# Patient Record
Sex: Male | Born: 1940 | Race: White | Hispanic: No | Marital: Married | Smoking: Former smoker
Health system: Southern US, Community
[De-identification: ages and names within clinical notes are randomized; demographics above are authoritative.]

## PROBLEM LIST (undated history)

## (undated) DIAGNOSIS — E1159 Type 2 diabetes mellitus with other circulatory complications: Secondary | ICD-10-CM

## (undated) DIAGNOSIS — I5032 Chronic diastolic (congestive) heart failure: Secondary | ICD-10-CM

## (undated) DIAGNOSIS — I152 Hypertension secondary to endocrine disorders: Secondary | ICD-10-CM

## (undated) DIAGNOSIS — N184 Chronic kidney disease, stage 4 (severe): Secondary | ICD-10-CM

## (undated) DIAGNOSIS — G4733 Obstructive sleep apnea (adult) (pediatric): Secondary | ICD-10-CM

## (undated) DIAGNOSIS — E119 Type 2 diabetes mellitus without complications: Secondary | ICD-10-CM

## (undated) DIAGNOSIS — G629 Polyneuropathy, unspecified: Secondary | ICD-10-CM

## (undated) DIAGNOSIS — K219 Gastro-esophageal reflux disease without esophagitis: Secondary | ICD-10-CM

## (undated) DIAGNOSIS — N183 Chronic kidney disease, stage 3 unspecified: Secondary | ICD-10-CM

## (undated) DIAGNOSIS — M1990 Unspecified osteoarthritis, unspecified site: Secondary | ICD-10-CM

## (undated) DIAGNOSIS — I10 Essential (primary) hypertension: Secondary | ICD-10-CM

## (undated) DIAGNOSIS — Z794 Long term (current) use of insulin: Secondary | ICD-10-CM

## (undated) DIAGNOSIS — I5030 Unspecified diastolic (congestive) heart failure: Secondary | ICD-10-CM

## (undated) DIAGNOSIS — G4730 Sleep apnea, unspecified: Secondary | ICD-10-CM

## (undated) DIAGNOSIS — Z8489 Family history of other specified conditions: Secondary | ICD-10-CM

## (undated) DIAGNOSIS — I129 Hypertensive chronic kidney disease with stage 1 through stage 4 chronic kidney disease, or unspecified chronic kidney disease: Secondary | ICD-10-CM

## (undated) HISTORY — PX: CHOLECYSTECTOMY: SHX55

## (undated) HISTORY — PX: KNEE ARTHROSCOPY: SUR90

## (undated) HISTORY — PX: BACK SURGERY: SHX140

## (undated) HISTORY — PX: APPENDECTOMY: SHX54

---

## 1998-03-20 ENCOUNTER — Ambulatory Visit (HOSPITAL_COMMUNITY): Admission: RE | Admit: 1998-03-20 | Discharge: 1998-03-21 | Payer: Self-pay | Admitting: Orthopedic Surgery

## 1998-05-22 ENCOUNTER — Other Ambulatory Visit: Admission: RE | Admit: 1998-05-22 | Discharge: 1998-05-22 | Payer: Self-pay | Admitting: Orthopedic Surgery

## 1998-10-23 ENCOUNTER — Encounter: Payer: Self-pay | Admitting: Neurosurgery

## 1998-10-23 ENCOUNTER — Observation Stay (HOSPITAL_COMMUNITY): Admission: RE | Admit: 1998-10-23 | Discharge: 1998-10-24 | Payer: Self-pay | Admitting: Neurosurgery

## 2001-03-24 ENCOUNTER — Encounter: Admission: RE | Admit: 2001-03-24 | Discharge: 2001-03-24 | Payer: Self-pay | Admitting: Orthopedic Surgery

## 2001-03-24 ENCOUNTER — Encounter: Payer: Self-pay | Admitting: Orthopedic Surgery

## 2001-05-30 ENCOUNTER — Encounter: Admission: RE | Admit: 2001-05-30 | Discharge: 2001-05-30 | Payer: Self-pay | Admitting: Neurosurgery

## 2001-05-30 ENCOUNTER — Encounter: Payer: Self-pay | Admitting: Neurosurgery

## 2001-06-16 ENCOUNTER — Encounter: Payer: Self-pay | Admitting: Neurosurgery

## 2001-06-22 ENCOUNTER — Encounter: Payer: Self-pay | Admitting: Neurosurgery

## 2001-06-22 ENCOUNTER — Inpatient Hospital Stay (HOSPITAL_COMMUNITY): Admission: RE | Admit: 2001-06-22 | Discharge: 2001-06-27 | Payer: Self-pay | Admitting: Neurosurgery

## 2001-09-20 ENCOUNTER — Encounter: Payer: Self-pay | Admitting: Neurosurgery

## 2001-09-20 ENCOUNTER — Encounter: Admission: RE | Admit: 2001-09-20 | Discharge: 2001-09-20 | Payer: Self-pay | Admitting: Neurosurgery

## 2003-01-28 ENCOUNTER — Ambulatory Visit (HOSPITAL_COMMUNITY): Admission: RE | Admit: 2003-01-28 | Discharge: 2003-01-28 | Payer: Self-pay | Admitting: Gastroenterology

## 2003-01-28 ENCOUNTER — Encounter: Payer: Self-pay | Admitting: Gastroenterology

## 2004-07-24 ENCOUNTER — Encounter: Admission: RE | Admit: 2004-07-24 | Discharge: 2004-07-24 | Payer: Self-pay | Admitting: Internal Medicine

## 2004-09-04 ENCOUNTER — Encounter: Admission: RE | Admit: 2004-09-04 | Discharge: 2004-09-04 | Payer: Self-pay | Admitting: Internal Medicine

## 2004-09-28 ENCOUNTER — Encounter (INDEPENDENT_AMBULATORY_CARE_PROVIDER_SITE_OTHER): Payer: Self-pay | Admitting: *Deleted

## 2004-09-28 ENCOUNTER — Inpatient Hospital Stay (HOSPITAL_COMMUNITY): Admission: RE | Admit: 2004-09-28 | Discharge: 2004-09-30 | Payer: Self-pay | Admitting: General Surgery

## 2005-06-21 ENCOUNTER — Encounter: Admission: RE | Admit: 2005-06-21 | Discharge: 2005-06-21 | Payer: Self-pay | Admitting: Neurosurgery

## 2005-07-13 ENCOUNTER — Inpatient Hospital Stay (HOSPITAL_COMMUNITY): Admission: RE | Admit: 2005-07-13 | Discharge: 2005-07-15 | Payer: Self-pay | Admitting: Neurosurgery

## 2006-06-22 ENCOUNTER — Encounter: Admission: RE | Admit: 2006-06-22 | Discharge: 2006-06-22 | Payer: Self-pay | Admitting: Orthopedic Surgery

## 2007-02-27 ENCOUNTER — Other Ambulatory Visit: Payer: Self-pay | Admitting: Orthopedic Surgery

## 2007-07-07 ENCOUNTER — Encounter: Payer: Self-pay | Admitting: Gastroenterology

## 2007-07-07 ENCOUNTER — Encounter: Admission: RE | Admit: 2007-07-07 | Discharge: 2007-07-07 | Payer: Self-pay | Admitting: Internal Medicine

## 2007-07-08 ENCOUNTER — Inpatient Hospital Stay (HOSPITAL_COMMUNITY): Admission: EM | Admit: 2007-07-08 | Discharge: 2007-07-10 | Payer: Self-pay | Admitting: Emergency Medicine

## 2007-11-20 ENCOUNTER — Encounter: Admission: RE | Admit: 2007-11-20 | Discharge: 2007-11-20 | Payer: Self-pay | Admitting: Orthopedic Surgery

## 2007-12-24 ENCOUNTER — Emergency Department (HOSPITAL_COMMUNITY): Admission: EM | Admit: 2007-12-24 | Discharge: 2007-12-24 | Payer: Self-pay | Admitting: Emergency Medicine

## 2007-12-25 ENCOUNTER — Emergency Department (HOSPITAL_COMMUNITY): Admission: EM | Admit: 2007-12-25 | Discharge: 2007-12-25 | Payer: Self-pay | Admitting: Emergency Medicine

## 2009-03-15 ENCOUNTER — Emergency Department (HOSPITAL_COMMUNITY): Admission: EM | Admit: 2009-03-15 | Discharge: 2009-03-15 | Payer: Self-pay | Admitting: Emergency Medicine

## 2009-05-05 ENCOUNTER — Encounter: Payer: Self-pay | Admitting: Gastroenterology

## 2009-06-11 ENCOUNTER — Encounter: Payer: Self-pay | Admitting: Gastroenterology

## 2009-06-16 ENCOUNTER — Encounter: Payer: Self-pay | Admitting: Gastroenterology

## 2009-06-16 ENCOUNTER — Telehealth (INDEPENDENT_AMBULATORY_CARE_PROVIDER_SITE_OTHER): Payer: Self-pay | Admitting: *Deleted

## 2009-06-16 DIAGNOSIS — K861 Other chronic pancreatitis: Secondary | ICD-10-CM

## 2009-07-10 ENCOUNTER — Ambulatory Visit (HOSPITAL_COMMUNITY): Admission: RE | Admit: 2009-07-10 | Discharge: 2009-07-10 | Payer: Self-pay | Admitting: Gastroenterology

## 2009-07-10 ENCOUNTER — Ambulatory Visit: Payer: Self-pay | Admitting: Gastroenterology

## 2010-08-23 ENCOUNTER — Encounter: Payer: Self-pay | Admitting: Internal Medicine

## 2010-08-23 ENCOUNTER — Encounter: Payer: Self-pay | Admitting: Gastroenterology

## 2010-11-03 LAB — IGG 1, 2, 3, AND 4
IgG Subclass 1: 713 mg/dL (ref 382–929)
IgG Subclass 2: 274 mg/dL (ref 241–700)
IgG Subclass 3: 62 mg/dL (ref 22–178)
IgG Subclass 4: 51.9 mg/dL (ref 4.0–86.0)
IgG Total IGGSUB: 1170 mg/dL (ref 694–1618)

## 2010-11-07 LAB — URINALYSIS, ROUTINE W REFLEX MICROSCOPIC
Bilirubin Urine: NEGATIVE
Glucose, UA: NEGATIVE mg/dL
Ketones, ur: NEGATIVE mg/dL
Leukocytes, UA: NEGATIVE
Nitrite: NEGATIVE
Protein, ur: 30 mg/dL — AB
Specific Gravity, Urine: 1.008 (ref 1.005–1.030)
Urobilinogen, UA: 0.2 mg/dL (ref 0.0–1.0)
pH: 5.5 (ref 5.0–8.0)

## 2010-11-07 LAB — COMPREHENSIVE METABOLIC PANEL
ALT: 30 U/L (ref 0–53)
AST: 28 U/L (ref 0–37)
Albumin: 3.2 g/dL — ABNORMAL LOW (ref 3.5–5.2)
Alkaline Phosphatase: 81 U/L (ref 39–117)
Glucose, Bld: 114 mg/dL — ABNORMAL HIGH (ref 70–99)
Potassium: 4.2 mEq/L (ref 3.5–5.1)
Sodium: 133 mEq/L — ABNORMAL LOW (ref 135–145)
Total Protein: 6.8 g/dL (ref 6.0–8.3)

## 2010-11-07 LAB — URINE MICROSCOPIC-ADD ON

## 2010-11-07 LAB — CBC
Hemoglobin: 14.1 g/dL (ref 13.0–17.0)
Platelets: 245 10*3/uL (ref 150–400)
RDW: 13.3 % (ref 11.5–15.5)

## 2010-11-07 LAB — DIFFERENTIAL
Basophils Relative: 0 % (ref 0–1)
Eosinophils Absolute: 0.1 10*3/uL (ref 0.0–0.7)
Eosinophils Relative: 1 % (ref 0–5)
Monocytes Absolute: 1 10*3/uL (ref 0.1–1.0)
Monocytes Relative: 11 % (ref 3–12)

## 2010-12-15 NOTE — H&P (Signed)
NAMEJOMES, [name] NO.:  [id number]   MEDICAL RECORD NO.:  [id number]          PATIENT TYPE:  INP   LOCATION:  [id number]                         FACILITY:  [facility]   PHYSICIAN:  Irine Seal, MD    DATE OF BIRTH:  [birth year]   DATE OF ADMISSION:  07/08/2007  DATE OF DISCHARGE:                              HISTORY & PHYSICAL   PRIMARY CARE PHYSICIAN:  Delanna Ahmadi, M.D.   HISTORY OF PRESENT ILLNESS:  Robert Dickerson is a 70 year old white male  with history of hypertension, status post cholecystectomy February 2006.  Multiple lumbar surgeries who presented to the ED with a three-day  history of nonradiating epigastric pain, chills, subjective fevers,  nausea and emesis on the day of admission.  The patient was initially  seen by his PCP one day prior to admission for these similar complaints.  A CT scan of abdomen and pelvis were done which are consistent with  acute pancreatitis without any biliary or pancreatic duct dilatation.  The patient was given some p.o. pain medications and sent home and told  to stay well hydrated.  On the morning of admission, the patient had  developed emesis after drinking clear liquids.  The patient called his  PCP, who sent the patient to the emergency room to be admitted.  The  patient denies any chest pain, no shortness of breath, no hematemesis,  no melena.  No dysuria, no cough, no headaches, no visual changes.  No  other associated symptoms.  The patient does also states that he does  note having some mild diarrhea for the past two days prior to admission  as well as clammy feeling.  No other complaints.   ALLERGIES:  NO KNOWN DRUG ALLERGIES.   PAST MEDICAL HISTORY:  1. Hypertension.  2. Gastroesophageal reflux disease.  3. 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 Luiz Ochoa in      November 2002.  4. Also the patient has a history of stenosis.  5. Spondylosis.  6. 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   PAST SURGICAL HISTORY:  1. Status post lumbar laminectomy x2 to the right L4-L5.  2. Diskectomies in 1999 and 2000.  3. Status post right knee arthroscopies x3.  4. Status post appendectomy in 1993.  5. Status post cholecystectomy February 2006.  6. Status post posterior lumbar interbody fusion of L4-L5 with      interbody cages and nonsegmental pedicle screws and posterolateral      fusion at L4-L5 with autograft per Dr. Luiz Ochoa in November 2002.  7. Status post redo [facility] decompressive laminectomies at L2-L3, L3-L4,      posterior lumbar interbody fusion L2-L3 and L3-L4, Saber antibiotic      changes at L2-L3, L3-L4, segmental pedicle screw fixation  with      Monarch screws L2-L4, posterior lateral fusion L2-L4 autograft      Infuse bone morphogenic protein done in July 13, 2005, per Dr.      Luiz Ochoa.   HOME MEDICATIONS:  1. Benicar/hydrochlorothiazide 40/12.5 mg p.o. daily.  2. Ranitidine 150 mg p.o. daily.  3. Hydrocodone 5/500 one tablet p.o. q.4h. p.r.n. started yesterday.  4. Naprosyn p.r.n.  That is on hold.   SOCIAL HISTORY:  The patient is disabled, married, lives in [place]  with his wife, former history of tobacco abuse but quit in 1984,  currently tobacco-free, no alcohol use, no IV drug use.   FAMILY HISTORY:  Mother is alive at age 42 with hypertension, dementia,  osteoporosis.  Father is alive at age 82 status post nephrectomy and  renal carcinoma.  Brother with hypertension and has a son who is status  post cholecystectomy.  No family history of pancreatic disease.   REVIEW OF SYSTEMS:  As per HPI, otherwise negative.   PHYSICAL EXAMINATION:  VITAL SIGNS: Temperature 98.2, blood pressure  124/74,  pulse of 77, respiratory rate 18, sating 99% on room air.  GENERAL:  The patient is lying on a gurney.  HEENT:  Normocephalic, atraumatic.  Pupils equal, round and react to  light.  Extraocular movements intact.  Oropharynx was clear, dry.  No  lesions.  No exudates.  NECK:  Supple.  No lymphadenopathy.  RESPIRATORY:  Lungs are clear to auscultation bilaterally.  CARDIOVASCULAR:  Regular rate and rhythm.  No murmurs, rubs or gallops.  ABDOMEN:  Soft, nontender, mild distention.  Positive bowel sounds.  No  rebound or guarding.  EXTREMITIES:  No clubbing, cyanosis or edema.  NEUROLOGIC:  The patient is alert and oriented x3.  Cranial nerves II-  XII are grossly intact.  No focal deficits.  Sensation is intact.  Cerebellum is intact.  Gait was not tested.   LABORATORY DATA:  Lipase of 33.  CMET with sodium 130, potassium 3.7,  chloride 96, bicarb 24, BUN 42, creatinine 3.10, glucose of 157.  Bilirubin 1.3, alkaline phosphatase 96, AST 20, ALT 25, protein 6.8,  albumin 2.7.  Baseline creatinine was 1.32.  CBC with white count 12.2,  hemoglobin of 14.1, platelets 326, hematocrit 40.1, ANC of 10.9.   CT of abdomen and pelvis done on July 07, 2007, showed fullness in  the pancreatic head and uncinate process with peripancreatic  edema/inflammation.  Imaging feature suspicious for pancreatitis or the  pancreatic head did appear somewhat full on comparison study as well.  There is more peripancreatic edema/inflammation on today's exam/ these  may represent recurrent focal pancreatitis.  Given the lack of biliary  or pancreatic ductal dilatation and mass lesion is felt to be less  likely.  A follow-up MRI could be performed after allowing time for  resolution of acute findings to further assess the pancreatic parenchyma  up warranted.  No acute findings in the anatomic pelvis.   ASSESSMENT AND PLAN:  Robert Dickerson is a 70 year old gentleman with  history of hypertension, status post a  cholecystectomy and multiple  lumbar surgeries who is presenting with acute pancreatitis.  1. Acute pancreatitis of questionable etiology.  Differential includes      gallstones which is unlikely as the patient is status post      cholecystectomy in 2006.  CT scan did not show any biliary      dilatation or pancreatic dilatation versus alcohol abuse.  The  patient has no history of alcohol use.  Denies any alcohol abuse      versus hypertriglyceridemia.  The patient states that he had a      fasting lipid panel which was obtained in the primary care      physician's office recently versus drug-induced as the patient is      on Benicar/hydrochlorothiazide versus an infectious etiology versus      idiopathic.  Will place the patient on bowel rest, hydrate with IV      fluids and pain management and follow.  2. Acute renal failure.  The patient's baseline creatinine is 1.32 as      of July 2008, is prerenal versus renal versus post renal.  Will      check a fractional secretion of sodium.  Check check a FeNa, will      hydrate the patient with IV fluids, follow the patient's      creatinine.  If no improvement in the patient's creatinine,      consider getting a renal ultrasound to rule out obstruction  3. Hypertension.  Hold blood pressure meds for now secondary to      problem #1.  4. Gastroesophageal reflux disease.  Protonix 40 mg daily.  5. Leukocytosis.  We are going to go ahead and panculture the patient,      will get blood cultures x2.  Chest x-ray, urinalysis with cultures      and sensitivity.  We will start the patient on empiric antibiotics      with ciprofloxacin and await culture results and tailor antibiotics      accordingly.  6. Hyponatremia.  Likely hypovolemic hyponatremia.  We will hydrate      the patient on IV fluids and follow the patient's sodium.  If no      with improvement or worsening, we may consider getting a urine      osmolality.  7. Status post  multiple lumbar surgeries.  Pain control and follow.  8. Diarrhea.  Question of a viral gastroenteritis.  Will check stool      cultures for fecal leukocytes, ova and parasites, enteric bowel      pathogens and Clostridium difficile.  We will follow.  9. Prophylaxis.  Continue the patient on Protonix 40 mg IV daily for      gastrointestinal prophylaxis and Lovenox for deep venous thrombosis      prophylaxis.   It has been a pleasure taking care of Robert Dickerson.      Irine Seal, MD  Electronically Signed     DT/MEDQ  D:  07/08/2007  T:  07/09/2007  Job:  [id number]

## 2010-12-18 NOTE — Op Note (Signed)
Robert Dickerson, OLDT NO.:  [id number]   MEDICAL RECORD NO.:  [id number]          PATIENT TYPE:  INP   LOCATION:  2899                         FACILITY:  [facility]   PHYSICIAN:  Otilio Connors, M.D.  DATE OF BIRTH:  Oct 26, 1940   DATE OF PROCEDURE:  07/13/2005  DATE OF DISCHARGE:                                 OPERATIVE REPORT   PREOPERATIVE DIAGNOSIS:  Stenosis, spondylosis, spondylolisthesis at L2-L3  and L3-L4 with instability and prior surgery with fusion L4-L5.   POSTOPERATIVE DIAGNOSIS:  Stenosis, spondylosis, spondylolisthesis at L2-L3  and L3-L4 with instability and prior surgery with fusion L4-L5.   PROCEDURE:  Redo [facility] decompressive laminectomies at L2-L3 and L3-L4,  posterior lumbar interbody fusion L2-L3 and L3-L4, Saber interbody cages at  L2-L3 and L3-L4, segmented pedicle screw fixation with Monarch screws L2  through L4, posterolateral fusion L2 through L4 (two levels), autograft same  incision, Infuse bone morphogenic protein.   SURGEON:  Otilio Connors, M.D.   ASSISTANT:  None.   ANESTHESIA:  General endotracheal anesthesia.   ESTIMATED BLOOD LOSS:  500 mL.   BLOOD REPLACED:  400 mL Cell Saver.   COMPLICATIONS:  None.   DRAINS:  None.   INDICATIONS FOR PROCEDURE:  The patient is a 70 year old gentleman who has  had back and right leg pain and hip pain.  He had a fusion four years ago at  L4-L5 and had done well until the last six months or so when he started  having progressive symptoms.  MRI and x-rays were done showing severe  spondylitic changes at the 2-3 and 3-4 levels with some slight movement  between flexion and extension, slight scoliosis, slight spondylolisthesis,  and stenosis at these levels, foraminal narrowing with a good fusion at 4-5.  The patient is brought in for redo laminectomy and extension of the fusion  up to 2-3 and 3-4.   PROCEDURE IN DETAIL:  The patient was brought to the operating room, general  anesthesia was induced, the patient was placed in the prone position on the  Wilson frame with all pressure points padded.  The patient was prepped and  draped in the usual sterile fashion.  The site of the incision was injected  with 20 mL of 1% lidocaine with epinephrine.  An incision was made at the  site of previous midline incision in the lumbar spine and extended cephalad.  The incision was taken down to the fascia.  Hemostasis was obtained with  Bovie cauterization.  The fascia was incised over the L2, L3, and L4 spinous  processes.  Subperiosteal dissection was done dissecting out to the lamina  over the facets and to the transverse processes of L2, L3, and L4.  At L4,  there was a pedicle screw, this was found and we exposed the pedicle screws  at 4 and 5 bilaterally.  The top locking nuts were then removed, the rods  were removed.  We pushed on the L4 fusion, x-rays showed it was well fused,  it appeared to be well fused here interoperatively.  The L5 screws  were  removed.  Carefully dissecting L4 lamina because half of it was removed from  the prior surgery and there was significant scar.  Laminectomies were then  done removing the bottom part of L2, all of L3, and the rest of L4,  dissecting into the scar tissue to decompress the central canal.  We tracked  this out laterally doing facetectomies decompressing the whole canal and the  nerve roots decompressed out their foramen.  A large disc bulge was seen at  2-3 and was even worse at 3-4 and there was significant stenosis where the  prior surgery was just below 3-4 where the hypertrophic ligament had melted  into the scar tissue and we carefully dissected this out to decompress the  central canal and decompress the 4 roots.  Once we had good decompression of  all the nerve roots, we explored the epidural space and obtained hemostasis  with bipolar cauterization, incised the disc space at 3-4 bilaterally and  then discectomy was  performed.  We distracted the disc space up to 9 mm  using interbody distractors and prepared the interbody spaces for the cages  by using various broaches.  We continued to remove disc and remove the  cartilaginous endplate.  These were rasped to clean off the endplates and  then we packed two Saber cages with Infuse BMP that was put in the anterior  aspect of the cage using autograft bone that was removed during the  laminectomy, cleaned of all soft tissues, chopped into small pieces.  Posteriorly, we put the autograft bone into the disc space and then tapped a  cage into place alongside while holding distraction.  We removed the  distractor and placed a second cage on the opposite side after placing bone  in the interspace.  We repeated this process at the 2-3 level using the 9 by  9 cage there.  Lateral fluoroscopy showed good position of the cages with  good alignment of the spine.  Hemostasis was obtained with Gelfoam and  thrombin and attention was turned to the transverse processes and facets  which were decorticated with a high speed drill.  Using fluoroscopy as a  guide and interoperative landmarks, decorticated at the L2 pedicle entry  point on the right side, placed a pedicle probe down the pedicle, and probed  with a small ball probe making sure the bony edges were tapped and placed a  Monarch 55 mm screw into the L2 pedicle.  This process was repeated at L3  using a 50 mm screw, the L4 screw was already in place from prior surgery.  We used the same process on the left side using the same size screws.  Again, we used fluoroscopy during the process of putting the screws in.  We  then placed rods into the screw heads, placed locking caps, and then  tightened the screws, putting compression over the screws, locked the next  one, compressed over the two remaining screws, and locked the last one and  repeated this on the opposite side.  When we were finished, final AP and lateral  fluoroscopic imaging was obtained and this showed good position of  pedicle screws, rods, interbody bone plugs, and good alignment of the spine.  The wound was irrigated with antibiotic solution, we had good hemostasis,  Gelfoam and thrombin was irrigated  out of the epidural space and a some new  was lightly placed over the lateral gutters so no bone graft would fall into  that area.  Infuse BMP was cut in strips and placed in the posterolateral  gutter for posterolateral fusion from L2 to L4 and then the rest of the  autograft bone was placed in the posterolateral gutters for a fusion from L2  to L4 bilaterally at the posterolateral space.  The retractors were removed,  we had good hemostasis.  The paraspinous muscles were closed with 0 Vicryl  interrupted sutures, the fascia was closed with 0 Vicryl interrupted  sutures, the subcutaneous tissues were closed with 0, 2-0 and 3-0 Vicryl  interrupted sutures, and the skin was closed with Benzoin and Steri-Strips.  A dressing was placed.  The patient was placed back in a supine position,  awakened from anesthesia, and transferred to the recovery room in stable  condition.           ______________________________  Otilio Connors, M.D.     JRH/MEDQ  D:  07/13/2005  T:  07/14/2005  Job:  [id number]

## 2010-12-18 NOTE — H&P (Signed)
[place]  Patient:    Robert Dickerson, Robert Dickerson Visit Number: [id number] MRN: [id number]          Service Type: SUR Location: [place] 03 Attending Physician:  Hazle Coca Dictated by:   Otilio Connors, M.D. Admit Date:  06/22/2001                           History and Physical  CHIEF COMPLAINT:  Back and right leg pain.  HISTORY OF PRESENT ILLNESS:  The patient is a 70 year old gentleman who has undergone two right L4-5 diskectomies in 1999 and then again in 2000.  After the second one, he still had a decreased amount of pain down his leg, but still had pain.  Further work-up showed no sign of recurrent disk herniation in May of 2000, but he continued to worsen over the last couple of years. Over the last couple of months, he has had an increase in the amount of pain with pain radiating down his right leg.  No left leg symptoms.  He also complains of back pain, which has not improved despite anti-inflammatories, physical therapy, and other interventions.  An MRI was done showing recurrent disk herniation and some scar at the right side at L4-5 causing canal stenosis and nerve root decompression.  There is also a broad based bulge in the lateral ______ causing approximately L4 compression.  There degenerative disk disease and increased disk height that we can see.  There is no abnormal motion between flexion and extension.  The patient is brought in for decompression and fusion for recurrent disk herniation and degenerative disk disease.  PAST MEDICAL HISTORY:  Significant for hypertension.  He is currently disabled because of knee and back problems.  MEDICATIONS:  Hydrochlorothiazide, Zantac, and Naprosyn.  ALLERGIES:  No known drug allergies.  SOCIAL HISTORY:  Again, he is disabled.  He is married.  He does not smoke or drink alcohol.  FAMILY HISTORY:  Noncontributory.  PAST SURGICAL HISTORY:  Surgeries include the two lumbar laminectomies  as mentioned above, three arthroscopic knee procedures, and appendectomy in 1993.  REVIEW OF SYSTEMS:  Otherwise negative.  PHYSICAL EXAMINATION:  He is awake, alert, and in no apparent distress.  WEIGHT:  229 pounds.  HEIGHT:  5 feet 10 inches.  VITAL SIGNS:  Blood pressure 158/98, pulse 90, temperature 97.2 degrees.  HEENT:  Unremarkable.  NECK:  Supple.  LUNGS:  Clear.  HEART:  Regular rate and rhythm.  ABDOMEN:  Soft and nontender.  EXTREMITIES:  Intact.  No edema.  NEUROLOGIC:  Positive straight leg raising on the right with pain going into the calf, but not into the foot.  Negative left.  Motor strength shows 4+/5 strength in the right dorsiflexion and extensor hallucis longus. Sensation is decreased in the right L4 distribution to light touch and pinprick.  ASSESSMENT AND PLAN:  With recurrent disk herniation and degenerative disk disease, we have recommended decompression and fusion and the patient was admitted for such. Dictated by:   Otilio Connors, M.D. Attending Physician:  Hazle Coca DD:  06/22/01 TD:  06/22/01 Job: [id number]

## 2010-12-18 NOTE — Discharge Summary (Signed)
[place]  Patient:    NUMAIR, SCHALOW Visit Number: [id number] MRN: [id number]          Service Type: SUR Location: [place] 01 Attending Physician:  Hazle Coca Dictated by:   Otilio Connors, M.D. Admit Date:  06/22/2001 Discharge Date: 06/27/2001                             Discharge Summary  DIAGNOSES:  Recurrent herniated nucleus pulposus and degenerative disk disease at L4-5.  PROCEDURE:  Posterior lumbar interbody fusion L4-5 with Brantigan interbody cages, segmental pedicle screw, posterolateral fusion L4-5 with autograft sterile incision.  REASON FOR ADMISSION:  The patient is a 70 year old gentleman who has had two right 4-5 diskectomies in the past, who has had continued and recurrent pain, and work-up shows recurrent disk herniation with collapse of the disk space and degenerative disk disease and foraminal narrowing.  The patient brought in for decompression and fusion.  HOSPITAL COURSE:  The patient was admitted to Day Surgery and underwent the procedure above.  He did have a dural rent intraoperatively which was repaired primarily.  He was then kept at lots of bed rest for 72 hours.  During that time, he developed a postop ileus.  He was moving his legs well and did not have any leg pain and no headaches or drainage from the incision.  We then started getting him up and during the 24th, we increased his activity to where he was up ambulating without headache, no wound drainage.  He still had no bowel movement at that time but did by 06/26/01 and over the last 24 hours started eating and having bowel movements.  Abdominal distension has decreased.  His back pain is minimal, taking minimal pain medication and no leg pain.  He has been up ambulating and doing well.  Incision is clean, dry, and intact.  He will be discharged home in stable condition.  DISCHARGE MEDICATIONS:  Same as prehospitalization plus Flexeril p.r.n.  spasms and Percocet 1-2 p.o. q.4-6h. p.r.n. pain.  No NSAIDs for the next 3-4 weeks.  DIET:  As tolerated.  INSTRUCTIONS:  Keep the incision dry for three more days, up with brace only. No strenuous and follow up with me in three weeks in my office. Dictated by:   Otilio Connors, M.D. Attending Physician:  Hazle Coca DD:  06/27/01 TD:  06/27/01 Job: [id number]

## 2010-12-18 NOTE — Discharge Summary (Signed)
NAMELEVON, HECHT NO.:  [id number]   MEDICAL RECORD NO.:  [id number]          PATIENT TYPE:  INP   LOCATION:  [id number]                         FACILITY:  [facility]   PHYSICIAN:  Delanna Ahmadi, M.D.  DATE OF BIRTH:  Sep 23, 1940   DATE OF ADMISSION:  07/08/2007  DATE OF DISCHARGE:  07/10/2007                               DISCHARGE SUMMARY   REASON FOR ADMISSION:  Mr. Virola is a 70 year old white male who had  presented to the office on December 5 complaining of abdominal pain and  chills.  The patient had had a CT scan of the abdomen that day  consistent with acute pancreatitis.  He had been found to have a mildly  elevated creatinine.  He was sent home from the office on clear liquids.  On the next day, he had developed vomiting on several occasions and  could not keep down any fluid.  The patient was sent to the ER for  admission.   SIGNIFICANT FINDINGS:  VITAL SIGNS:  Temperature 98.2, blood pressure  124/74, heart 77, respirations 18.  LUNGS:  Lungs were clear.  HEART: Regular rate and rhythm without murmur, gallop or rub.  ABDOMEN:  Soft, nontender, mild distention.  Normal bowel sounds   LABORATORY DATA:  Lipase 33, sodium 130, potassium 3.7, chloride 96,  bicarbonate 24, BUN 42, creatinine 3.1, glucose 157, total bilirubin  1.3, alk phos 96, AST 20, ALT 25, BUN 2.7.  CBC:  WBC 12.2, hemoglobin  14.1, platelet count 326.   HOSPITAL COURSE:  Acute renal failure.  The patient was admitted for  acute renal failure.  His creatinine had risen from 1.7 to 3.1 in 24  hours.  His renal failure was likely related to volume depletion  secondary to pancreatitis in the face of taking ARB and nonsteroidal  anti-inflammatory agent.  The patient was treated with IV fluids.  He  had a good urine output.  His creatinine dropped to 2.7 by discharge.  He had no nausea, vomiting.  No abdominal pain.  His ARB and Naprosyn  were held.  He was discharged in good condition.   DISCHARGE DIAGNOSES:  1. Acute renal failure.  2. Acute pancreatitis.  3. Hypertension.  4. Gastroesophageal reflux disease.  5. 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   PROCEDURES:  None.   DISCHARGE MEDICATIONS:  1. Ranitidine 150 mg one a day.  2. __________  and Naprosyn were held.   DISCHARGE INSTRUCTIONS:  He was asked to drink lots of fluids.   DISCHARGE FOLLOWUP:  Follow up in 2 days with Dr. Laurann Montana for repeat B-  Met.   DISCHARGE DIET:  Low fat diet.   DISCHARGE ACTIVITIES:  As tolerated.   DISPOSITION:  Discharged to home.           ______________________________  Delanna Ahmadi, M.D.     JJG/MEDQ  D:  07/11/2007  T:  07/11/2007  Job:  [id number]

## 2010-12-18 NOTE — Op Note (Signed)
   NAME:  [name], [name]                           ACCOUNT NO.:  [id number]   MEDICAL RECORD NO.:  [id number]                   PATIENT TYPE:  AMB   LOCATION:  ENDO                                 FACILITY:  [facility]   PHYSICIAN:  Earle Gell, M.D.                DATE OF BIRTH:  1940-10-21   DATE OF PROCEDURE:  01/28/2003  DATE OF DISCHARGE:                                 OPERATIVE REPORT   PROCEDURE INDICATION:  [name] is a 70 year old male born on 1941-05-04.  [name] is scheduled to undergo his second screening colonoscopy  with polypectomy to prevent colon cancer.  [name] tells me he last  underwent a colonoscopy in [place], [place], approximately seven  years ago and colon polyps may have been removed during the procedure.   ENDOSCOPIST:  Earle Gell, M.D.   PREMEDICATION:  Versed 10 mg and Demerol 50 mg.   DESCRIPTION OF PROCEDURE:  After obtaining informed consent, Mr. [name] was  placed in the left lateral decubitus position.  I administered intravenous  Demerol and intravenous Versed to achieve conscious sedation for the  procedure.  The patient's blood pressure, oxygen saturation, and cardiac  rhythm were monitored throughout the procedure and documented in the medical  record.   Anal inspection was normal.  Digital rectal exam was normal.  The prostate  was non-nodular.  The Olympus adult colonoscope was introduced into the  rectum and advanced to the cecum.  Colonic preparation for the exam today  was excellent.   Rectum normal.   Sigmoid colon and descending colon normal.   Splenic flexure normal.   Transverse colon normal.   Hepatic flexure normal.   Ascending colon normal.   Cecum and ileocecal valve normal.    ASSESSMENT:  Normal screening proctocolonoscopy to the cecum.  No endoscopic  evidence for the presence of colorectal neoplasia.                                               Earle Gell, M.D.    MJ/MEDQ  D:   01/28/2003  T:  01/28/2003  Job:  [id number]   cc:   Delanna Ahmadi, M.D.  [address] [place] [address]  Fax: [phone]

## 2010-12-18 NOTE — Op Note (Signed)
NAMEFUQUAN, OSWALT NO.:  [id number]   MEDICAL RECORD NO.:  [id number]          PATIENT TYPE:  INP   LOCATION:  2550                         FACILITY:  [facility]   PHYSICIAN:  Shellia Carwin, M.D. DATE OF BIRTH:  [birth year]   DATE OF PROCEDURE:  09/28/2004  DATE OF DISCHARGE:                                 OPERATIVE REPORT   OPERATIVE PROCEDURE:  Attempted laparoscopic cholecystectomy, open  cholecystectomy.   SURGEON:  Shellia Carwin, M.D.   ASSISTANT:  Rudell Cobb. Annamaria Boots, M.D.   ANESTHESIA:  General.   PREOPERATIVE DIAGNOSIS:  Gallstones.   POSTOP DIAGNOSIS:  Gallstones, multiple adhesions.   CLINICAL SUMMARY:  A 70 year old male with symptomatic gallstones, multiple  and small with normal liver function studies brought in for elective  cholecystectomy. He has had a long paramedian incision for appendicitis many  years ago.   OPERATIVE FINDINGS:  Attempted laparoscopic was unsuccessful because of  multiple adhesions. The open procedure went well, gallbladder was thin-  walled and had multiple stones. The cystic duct was very small in size and  therefore no cholangiogram performed.   OPERATIVE PROCEDURE:  Under satisfactory general endotracheal anesthesia,  having received 1.0 grams Ancef preop, the patient's abdomen was prepped and  draped in standard fashion. A transverse incision made above the umbilicus  and the midline opened into the peritoneum. Camera placed and I could not  achieve good visualization. The port was then removed and finger dissection  used to sweep away some of the omentum and again attempts made with the  camera placement after CO2 and I could not achieve safe exposure and felt  that it was better to open the patient.   Accordingly the midline incision was closed with the previous figure-of-  eight 0 Vicryl and a second interrupted 0 Vicryl. Then a right subcostal  incision was made into the abdomen. Adhesions were taken down  by sharp and  blunt dissection and the gallbladder visualized. Packing was placed,  exposure set was self-retaining retractors and the gallbladder removed. It  was grasped high and then dissected downward. Bleeders were encountered and  treated with clips or cautery. The cystic artery was identified, controlled  with multiple clips and divided. The cystic duct was then the only structure  holding the gallbladder, it was quite small and cholangiogram not performed  but the duct was controlled with multiple clips and divided and the  gallbladder removed. Hemostasis checked and areas were either coagulated or  clipped. A piece of Surgicel was placed for further hemostasis. After the  abdomen lavaged with saline.  Then with correct sponge and needle and instrument counts, the abdomen  closed in layers with running #1 PDS and staples applied to both skin  incisions. A total of 30 cc of Marcaine was infiltrated during the procedure  for postop analgesia. He went to the recovery room in good condition.      MRL/MEDQ  D:  09/28/2004  T:  09/28/2004  Job:  [id number]   cc:   Delanna Ahmadi, M.D.  [address]  [place]  [place] [address]  Fax: [phone]

## 2010-12-18 NOTE — Op Note (Signed)
[facility]  Patient:    Robert Dickerson, Robert Dickerson Visit Number: [id number] MRN: [id number]          Service Type: SUR Location: [place] [facility] Attending Physician:  Hazle Coca Dictated by:   Otilio Connors, M.D. Proc. Date: 06/22/01 Admit Date:  06/22/2001                             Operative Report  PREOPERATIVE DIAGNOSIS:  Recurrent x 2 herniated nucleus pulposus at right L4-5 and degenerative disk disease.  POSTOPERATIVE DIAGNOSIS:  Recurrent x 2 herniated nucleus pulposus at right L4-5 and degenerative disk disease.  PROCEDURE:  Posterior lumbar interbody fusion, L4-5, with Brantigan interbody cages and nonsegmental pedicle screws and posterolateral fusion, L4-5, with autograft, same incision.  SURGEON:  Otilio Connors, M.D.  ASSISTANT:  Gwenyth Ober. Rolin Barry, M.D.  ANESTHESIA:  General endotracheal tube anesthesia.  ESTIMATED BLOOD LOSS:  800 cc.  BLOOD GIVEN:  None.  DRAINS:  None.  COMPLICATIONS:  Dural rent, repaired primarily.  INDICATION FOR PROCEDURE:  The patient is a 70 year old gentleman who has had two right 4-5 diskectomies and now has further recurrence of disk herniation. He has had collapse of that disk space with foraminal narrowing.  Patient brought in for decompression and fusion.  DESCRIPTION OF PROCEDURE:  The patient was brought in the operating room, and general anesthesia was induced.  The patient was placed in the prone position on the Wilson frame with all pressure points padded.  The patient was prepped and draped in a sterile fashion, and the site of the incision, which was the previous scar, was injected with 10 cc 1% lidocaine with epinephrine. Incision was made in the midline of the lower lumbar spine at the site of previous surgery.  The incision was lengthened at both ends a little bit.  The incision taken down to the fascia and hemostasis obtained with Bovie cauterization.  The fascia was incised with the  Bovie, and subperiosteal dissection was done over the L3, L4, and L5 spinous processes and laminae out to the facets.  Fluoroscopic imaging was used to confirm levels.  After confirming the levels, did the dissection on left side, did the dissection on the right side.  Scar was seen at the 4-5 interspace, and we stayed away from that at this point getting out to the facets.  The laminectomy was then performed with Leksell rongeurs, Kerrison punches, removing L4 and doing bilateral medial facetectomies with more taken on the right side.  As we were trying to dissect in the lamina-dura junction toward the scar tissue, a dural rent was obtained.  We continued the laminectomy, removing the bone and scar from the dura around the dural rent, and then this was closed primarily.  We continued our dissection laterally to the _____ of the right side.  There was a large amount of scar tissue compressing the 5 root, tethering the 5 root to the pedicle.  This was all removed and loosened very carefully.  There did seem to be some disk material within this, also some calcified fragments adherent to the dura.  As we removed all this, we got better and better decompression.  The disk space was then entered and diskectomy performed with pituitary rongeurs and curettes.  We then performed diskectomy on the left side.  Lateral diskectomies were then done bilaterally, and a lot of extruded fragment was removed from the right side,  which was probably compressing the 4 root.  We then started distracting with interspace distractors at the 4-5 interspace, distracting up to 11 mm.  We prepared the interbody space for Brantigan cages using the broach and then after cleaning all the bone that was removed during the laminectomy, chopping it into small pieces and adding Grafton putty to this.  This mixture was then packed into two 11 high x 9 mm wide Brantigan cages.  With distraction on one side, the cage was tapped  into the opposite side.  The distractor was then removed, bone was packed into the interspace, and then the second cage was placed.  Fluoroscopic imaging was used during the procedure.  We then explored the nerve roots.  All the pressure was off them.  We then found our pedicle entry points at L4 and L5, decorticated with a high-speed drill, entered the pedicle with a pedicle probe, and then tapped into the pedicles bilaterally at L4 and L5.  We then placed a 6.25 x 50 mm long variable-axis screw at the L4 pedicles and the same diameter but 45 mm long screws to the L5 pedicles.  An 85 mm rod was cut in half, and one rod was placed in each side in the pedicle screw heads.  Locking nuts were then placed on top of those, and the L4 nuts were tightened bilaterally.  With compression over the 4-5 interspace, the lower nuts were tightened.  AP and lateral fluoroscopic imaging was obtained and confirmed good position of the screws and rods and the interbody cages.  The wounds were irrigated with antibiotic solution.  The spinous processes were decorticated, and posterolateral fusion was done with the autograft bone at L4-5.  Retractor was removed.  The Valsalva maneuver was obtained showing no hemorrhage and no further CSF leak.  Surgicel was placed over the dura, and then Tisseel tissue glue was then placed over the dura to seal the area.  Paraspinous muscles were then closed with 0 Vicryl interrupted sutures.  The fascia was closed with the same, the subcutaneous tissue was closed with 0, 2-0, and 3-0 Vicryl interrupted sutures, and the skin closed with Steri-Strips.  Dressing was placed.  The patient was placed back in the supine position, awoken from anesthesia, and transferred to the recovery room. Dictated by:   Otilio Connors, M.D. Attending Physician:  Hazle Coca DD:  06/22/01 TD:  06/22/01 Job: [id number]

## 2010-12-18 NOTE — Discharge Summary (Signed)
NAMECHRISTEN, [name] NO.:  [id number]   MEDICAL RECORD NO.:  [id number]          PATIENT TYPE:  INP   LOCATION:  5702                         FACILITY:  [facility]   PHYSICIAN:  Robert Dickerson, M.D. DATE OF BIRTH:  1940/09/17   DATE OF ADMISSION:  09/28/2004  DATE OF DISCHARGE:  09/30/2004                                 DISCHARGE SUMMARY   CHIEF COMPLAINT:  Gallstones.   HISTORY OF PRESENT ILLNESS:  Robert Dickerson is a 70 year old male with significant  abdominal symptoms and gallstones who comes in for elective cholecystectomy.  He has had multiple operative procedures and I told him he might not be able  to have it done laparoscopically.   LABORATORY DATA:  Pathology:  Chronic cholecystitis and cholelithiasis.  Hemoglobin 14.5, hematocrit 43, white count 4700.  CMET was normal.  EKG  marked sinus bradycardia.   HOSPITAL COURSE:  On the morning of admission the patient had attempted  laparoscopic cholecystectomy that had to be converted to open because of all  of his adhesions.  He did quite well postoperative.  He was walking, voiding  and regained bowel function.  Accordingly, on his second postoperative day,  he was allowed home on a regular diet, limited activity and will be followed  up in the office as an outpatient.   DISCHARGE DIAGNOSIS:  Cholecystitis.   COMPLICATIONS/INFECTIONS/CONSULTATIONS:  None.   CONDITION ON DISCHARGE:  Good.      MRL/MEDQ  D:  12/01/2004  T:  12/01/2004  Job:  [id number]   cc:   Delanna Ahmadi, M.D.  [address] [place] [address]  Fax: [phone]

## 2011-01-05 ENCOUNTER — Other Ambulatory Visit: Payer: Self-pay | Admitting: Internal Medicine

## 2011-01-05 ENCOUNTER — Ambulatory Visit
Admission: RE | Admit: 2011-01-05 | Discharge: 2011-01-05 | Disposition: A | Discharge status: Home / Self Care | Payer: PRIVATE HEALTH INSURANCE | Source: Ambulatory Visit | Attending: Internal Medicine | Admitting: Internal Medicine

## 2011-01-05 ENCOUNTER — Ambulatory Visit
Admission: RE | Admit: 2011-01-05 | Discharge: 2011-01-05 | Disposition: A | Discharge status: Home / Self Care | Payer: Medicare Other | Source: Ambulatory Visit | Attending: Internal Medicine | Admitting: Internal Medicine

## 2011-01-05 DIAGNOSIS — R42 Dizziness and giddiness: Secondary | ICD-10-CM

## 2011-02-11 ENCOUNTER — Other Ambulatory Visit: Payer: Self-pay | Admitting: Gastroenterology

## 2011-02-11 ENCOUNTER — Ambulatory Visit (HOSPITAL_COMMUNITY)
Admission: RE | Admit: 2011-02-11 | Discharge: 2011-02-11 | Disposition: A | Discharge status: Home / Self Care | Payer: Medicare Other | Source: Ambulatory Visit | Attending: Gastroenterology | Admitting: Gastroenterology

## 2011-02-11 DIAGNOSIS — R634 Abnormal weight loss: Secondary | ICD-10-CM | POA: Insufficient documentation

## 2011-02-11 DIAGNOSIS — K2990 Gastroduodenitis, unspecified, without bleeding: Secondary | ICD-10-CM | POA: Insufficient documentation

## 2011-02-11 DIAGNOSIS — Z860100 Personal history of colon polyps, unspecified: Secondary | ICD-10-CM | POA: Insufficient documentation

## 2011-02-11 DIAGNOSIS — Z981 Arthrodesis status: Secondary | ICD-10-CM | POA: Insufficient documentation

## 2011-02-11 DIAGNOSIS — N189 Chronic kidney disease, unspecified: Secondary | ICD-10-CM | POA: Insufficient documentation

## 2011-02-11 DIAGNOSIS — K219 Gastro-esophageal reflux disease without esophagitis: Secondary | ICD-10-CM | POA: Insufficient documentation

## 2011-02-11 DIAGNOSIS — Z9089 Acquired absence of other organs: Secondary | ICD-10-CM | POA: Insufficient documentation

## 2011-02-11 DIAGNOSIS — K259 Gastric ulcer, unspecified as acute or chronic, without hemorrhage or perforation: Secondary | ICD-10-CM | POA: Insufficient documentation

## 2011-02-11 DIAGNOSIS — R1013 Epigastric pain: Secondary | ICD-10-CM | POA: Insufficient documentation

## 2011-02-11 DIAGNOSIS — N400 Enlarged prostate without lower urinary tract symptoms: Secondary | ICD-10-CM | POA: Insufficient documentation

## 2011-02-11 DIAGNOSIS — N529 Male erectile dysfunction, unspecified: Secondary | ICD-10-CM | POA: Insufficient documentation

## 2011-02-11 DIAGNOSIS — K2970 Gastritis, unspecified, without bleeding: Secondary | ICD-10-CM | POA: Insufficient documentation

## 2011-02-11 DIAGNOSIS — I129 Hypertensive chronic kidney disease with stage 1 through stage 4 chronic kidney disease, or unspecified chronic kidney disease: Secondary | ICD-10-CM | POA: Insufficient documentation

## 2011-02-11 DIAGNOSIS — D1330 Benign neoplasm of unspecified part of small intestine: Secondary | ICD-10-CM | POA: Insufficient documentation

## 2011-02-11 DIAGNOSIS — M1710 Unilateral primary osteoarthritis, unspecified knee: Secondary | ICD-10-CM | POA: Insufficient documentation

## 2011-02-11 NOTE — Op Note (Signed)
NAMENARAYANA, CONNELLEY NO.:  [id number]  MEDICAL RECORD NO.:  [id number]  LOCATION:  WLEN                         FACILITY:  [facility]  PHYSICIAN:  Earle Gell, M.D.   DATE OF BIRTH:  30-Apr-1941  DATE OF PROCEDURE:  02/11/2011 DATE OF DISCHARGE:                              OPERATIVE REPORT   REFERRING PHYSICIAN:  Delanna Ahmadi, M.D.  HISTORY:  [name] is a 70 year old male born 09-04-1940. The patient has a past history of recurrent acute pancreatitis.  In December 2009, endoscopic ultrasound showed changes consistent with chronic pancreatitis.  Differential diagnosis of chronic pancreatitis includes alcoholism, genetic pancreatitis, pancreatic ductal obstruction, hypertriglyceridemia, hypercalcemia, autoimmune pancreatitis, and idiopathic chronic pancreatitis.  For approximately 4 months, the patient has experienced epigastric discomfort with eating.  He denies vomiting or diarrhea.  He does take nonsteroidal anti-inflammatory medication on a regular basis.  He denies a history of peptic ulcer disease and reports no gastrointestinal bleeding.  Since May 2011, the patient's weight has dropped from 246 pounds to 205 pounds.  The patient's weight loss has been unintentional.  The patient's CBC, complete metabolic profile, and lipase levels were normal in May 2012 and July 2012, except for a slightly reduced estimated glomerular filtration rate.  The patient received antibiotic therapy for suspected H pylori gastritis.  PAST MEDICAL HISTORY:  Colon polyps removed in the past.  Hypertension. Chronic kidney disease.  Erectile dysfunction.  Gastroesophageal reflux disease.  Degenerative joint disease of both knees.  Chronic back pain. Benign prostatic hypertrophy.  Recurrent acute pancreatitis.  Chronic pancreatitis by endoscopic ultrasound.  Appendectomy.  Arthroscopy of knees.  Microdiskectomy for herniated disk.   Cholecystectomy. Colonoscopy with polyp removal.  Lumbar fusion surgery.  ENDOSCOPIST:  Earle Gell, M.D.  PREMEDICATIONS:  Fentanyl 50 mcg, Versed 5 mg.  PROCEDURE:  After obtaining informed consent, the patient was placed in the left lateral decubitus position.  The Pentax gastroscope was passed through the posterior hypopharynx into the proximal esophagus without difficulty.  The hypopharynx, larynx and vocal cords appeared normal.  Esophagoscopy:  The proximal mid and lower segments of the esophageal mucosa appeared normal.  The squamocolumnar junction was noted at 40 cm from the incisor teeth.  There was no endoscopic evidence for the presence of erosive esophagitis or Barrett's esophagus.  Gastroscopy:  Retroflex view of the gastric cardia and fundus was normal.  The gastric body appeared normal.  There are erosions in the gastric antrum and biopsies were performed.  The pylorus appears normal.  Duodenoscopy:  In the proximal duodenal bulb, there is a 10-mm sessile polyp which was biopsied.  The remainder of the duodenal bulb and descending duodenum appeared normal.  Bile could be seen draining from the second portion of the duodenum.  ASSESSMENT: 1. A 10-mm flat polyp in the proximal duodenal bulb, it was biopsied. 2. Erosions in the gastric antrum were biopsied. 3. Otherwise normal esophagogastroduodenoscopy.  I have no good explanation based on the patient's endoscopic findings this morning to explain his epigastric pain and unintentional weight loss.  I am concerned that he may have a pancreatic neoplasm.  Another possibility is chronic mesenteric ischemia.  I will schedule the patient for a CT scan with pancreatic protocol.  He tells me he cannot undergo an MR exam due to claustrophobia.          ______________________________ Earle Gell, M.D.     MJ/MEDQ  D:  02/11/2011  T:  02/11/2011  Job:  [id number]  cc:   Delanna Ahmadi, M.D. [phone]  Electronically Signed by Earle Gell M.D. on 02/11/2011 03:56:44 PM

## 2011-02-15 ENCOUNTER — Ambulatory Visit
Admit: 2011-02-15 | Discharge: 2011-02-15 | Disposition: A | Discharge status: Home / Self Care | Payer: Medicare Other | Attending: Gastroenterology | Admitting: Gastroenterology

## 2011-02-15 ENCOUNTER — Ambulatory Visit (HOSPITAL_COMMUNITY): Payer: Medicare Other

## 2011-02-15 ENCOUNTER — Ambulatory Visit
Admission: RE | Admit: 2011-02-15 | Discharge: 2011-02-15 | Disposition: A | Discharge status: Home / Self Care | Payer: Medicare Other | Source: Ambulatory Visit | Attending: Gastroenterology | Admitting: Gastroenterology

## 2011-02-15 ENCOUNTER — Other Ambulatory Visit: Payer: Self-pay | Admitting: Gastroenterology

## 2011-02-15 MED ORDER — IOHEXOL 300 MG/ML  SOLN: 100.0000 mL | Freq: Once | INTRAMUSCULAR | Status: AC | PRN

## 2011-02-15 MED ORDER — IOHEXOL 300 MG/ML  SOLN: 80.0000 mL | Freq: Once | INTRAMUSCULAR | Status: DC | PRN

## 2011-02-17 ENCOUNTER — Other Ambulatory Visit: Payer: Medicare Other

## 2011-03-04 ENCOUNTER — Other Ambulatory Visit (HOSPITAL_COMMUNITY): Payer: Self-pay | Admitting: Oncology

## 2011-03-04 ENCOUNTER — Encounter (HOSPITAL_BASED_OUTPATIENT_CLINIC_OR_DEPARTMENT_OTHER): Payer: Medicare Other | Admitting: Oncology

## 2011-03-04 DIAGNOSIS — R799 Abnormal finding of blood chemistry, unspecified: Secondary | ICD-10-CM

## 2011-03-04 DIAGNOSIS — I10 Essential (primary) hypertension: Secondary | ICD-10-CM

## 2011-03-04 DIAGNOSIS — R933 Abnormal findings on diagnostic imaging of other parts of digestive tract: Secondary | ICD-10-CM

## 2011-03-04 LAB — CBC WITH DIFFERENTIAL/PLATELET
BASO%: 0.4 % (ref 0.0–2.0)
LYMPH%: 17.7 % (ref 14.0–49.0)
MCHC: 33.7 g/dL (ref 32.0–36.0)
MCV: 85.6 fL (ref 79.3–98.0)
MONO%: 6.5 % (ref 0.0–14.0)
NEUT%: 73.6 % (ref 39.0–75.0)
Platelets: 278 10*3/uL (ref 140–400)
RBC: 4.81 10*6/uL (ref 4.20–5.82)

## 2011-03-04 LAB — COMPREHENSIVE METABOLIC PANEL
ALT: 8 U/L (ref 0–53)
AST: 14 U/L (ref 0–37)
BUN: 16 mg/dL (ref 6–23)
CO2: 23 mEq/L (ref 19–32)
Creatinine, Ser: 1.46 mg/dL — ABNORMAL HIGH (ref 0.50–1.35)
Total Bilirubin: 0.5 mg/dL (ref 0.3–1.2)

## 2011-03-04 LAB — URIC ACID: Uric Acid, Serum: 6.4 mg/dL (ref 4.0–7.8)

## 2011-03-04 LAB — LACTATE DEHYDROGENASE: LDH: 125 U/L (ref 94–250)

## 2011-03-11 ENCOUNTER — Other Ambulatory Visit: Payer: Self-pay | Admitting: Gastroenterology

## 2011-03-11 DIAGNOSIS — R634 Abnormal weight loss: Secondary | ICD-10-CM

## 2011-03-12 ENCOUNTER — Ambulatory Visit
Admission: RE | Admit: 2011-03-12 | Discharge: 2011-03-12 | Disposition: A | Discharge status: Home / Self Care | Payer: Medicare Other | Source: Ambulatory Visit | Attending: Gastroenterology | Admitting: Gastroenterology

## 2011-03-12 DIAGNOSIS — R634 Abnormal weight loss: Secondary | ICD-10-CM

## 2011-03-12 MED ORDER — IOHEXOL 350 MG/ML SOLN
100.0000 mL | Freq: Once | INTRAVENOUS | Status: AC | PRN
  Administered 2011-03-12: 100 mL via INTRAVENOUS

## 2011-04-16 ENCOUNTER — Encounter (HOSPITAL_BASED_OUTPATIENT_CLINIC_OR_DEPARTMENT_OTHER): Payer: Medicare Other | Admitting: Oncology

## 2011-04-16 DIAGNOSIS — R799 Abnormal finding of blood chemistry, unspecified: Secondary | ICD-10-CM

## 2011-04-16 DIAGNOSIS — R933 Abnormal findings on diagnostic imaging of other parts of digestive tract: Secondary | ICD-10-CM

## 2011-04-28 LAB — URINALYSIS, ROUTINE W REFLEX MICROSCOPIC
Bilirubin Urine: NEGATIVE
Leukocytes, UA: NEGATIVE
Nitrite: NEGATIVE
Specific Gravity, Urine: 1.026
Urobilinogen, UA: 0.2
pH: 5.5

## 2011-04-28 LAB — CBC
HCT: 39.9
HCT: 41.7
Hemoglobin: 14.3
MCHC: 34.6
MCV: 86.4
MCV: 86.5
Platelets: 312
Platelets: 375
RDW: 12.6
RDW: 13.2
WBC: 6.6

## 2011-04-28 LAB — DIFFERENTIAL
Basophils Absolute: 0
Basophils Absolute: 0.1
Eosinophils Absolute: 0
Eosinophils Relative: 1
Eosinophils Relative: 1
Lymphocytes Relative: 19
Lymphocytes Relative: 20
Lymphs Abs: 1.3
Lymphs Abs: 1.4
Monocytes Absolute: 0.6
Monocytes Absolute: 0.6
Neutro Abs: 4.6

## 2011-04-28 LAB — COMPREHENSIVE METABOLIC PANEL
AST: 50 — ABNORMAL HIGH
Albumin: 3.3 — ABNORMAL LOW
BUN: 21
Calcium: 9
Creatinine, Ser: 1.64 — ABNORMAL HIGH
GFR calc Af Amer: 51 — ABNORMAL LOW
Total Bilirubin: 1.1
Total Protein: 7.5

## 2011-04-28 LAB — POCT CARDIAC MARKERS
CKMB, poc: 2.3
Operator id: 231701
Troponin i, poc: <0.05

## 2011-04-28 LAB — URINE MICROSCOPIC-ADD ON

## 2011-04-28 LAB — POCT I-STAT, CHEM 8
Calcium, Ion: 1.21
Chloride: 106
Glucose, Bld: 108 — ABNORMAL HIGH
HCT: 42
Hemoglobin: 14.3
Potassium: 4

## 2011-05-09 ENCOUNTER — Emergency Department (HOSPITAL_COMMUNITY): Payer: Medicare Other

## 2011-05-09 ENCOUNTER — Emergency Department (HOSPITAL_COMMUNITY)
Admission: EM | Admit: 2011-05-09 | Discharge: 2011-05-09 | Disposition: A | Discharge status: Home / Self Care | Payer: Medicare Other | Attending: Emergency Medicine | Admitting: Emergency Medicine

## 2011-05-09 DIAGNOSIS — W010XXA Fall on same level from slipping, tripping and stumbling without subsequent striking against object, initial encounter: Secondary | ICD-10-CM | POA: Insufficient documentation

## 2011-05-09 DIAGNOSIS — Y92009 Unspecified place in unspecified non-institutional (private) residence as the place of occurrence of the external cause: Secondary | ICD-10-CM | POA: Insufficient documentation

## 2011-05-09 DIAGNOSIS — I10 Essential (primary) hypertension: Secondary | ICD-10-CM | POA: Insufficient documentation

## 2011-05-09 DIAGNOSIS — M1990 Unspecified osteoarthritis, unspecified site: Secondary | ICD-10-CM | POA: Insufficient documentation

## 2011-05-09 DIAGNOSIS — S298XXA Other specified injuries of thorax, initial encounter: Secondary | ICD-10-CM | POA: Insufficient documentation

## 2011-05-09 DIAGNOSIS — R079 Chest pain, unspecified: Secondary | ICD-10-CM | POA: Insufficient documentation

## 2011-05-10 LAB — URINALYSIS, ROUTINE W REFLEX MICROSCOPIC
Bilirubin Urine: NEGATIVE
Ketones, ur: NEGATIVE
Nitrite: NEGATIVE
Protein, ur: 30 — AB
pH: 5

## 2011-05-10 LAB — CBC
HCT: 34.7 — ABNORMAL LOW
HCT: 40.5
Hemoglobin: 14.1
MCHC: 34.8
MCV: 86
Platelets: 313
Platelets: 326
RDW: 13.9
RDW: 13.9

## 2011-05-10 LAB — URINE MICROSCOPIC-ADD ON

## 2011-05-10 LAB — FECAL LACTOFERRIN, QUANT: Fecal Lactoferrin: NEGATIVE

## 2011-05-10 LAB — URINE CULTURE: Colony Count: 1000

## 2011-05-10 LAB — CREATININE, URINE, RANDOM: Creatinine, Urine: 110.1

## 2011-05-10 LAB — CLOSTRIDIUM DIFFICILE EIA

## 2011-05-10 LAB — SODIUM, URINE, RANDOM: Sodium, Ur: 38

## 2011-05-10 LAB — BASIC METABOLIC PANEL
BUN: 45 — ABNORMAL HIGH
Chloride: 101
Glucose, Bld: 106 — ABNORMAL HIGH
Potassium: 3.7

## 2011-05-10 LAB — COMPREHENSIVE METABOLIC PANEL
ALT: 21
AST: 29
Albumin: 2.1 — ABNORMAL LOW
Albumin: 2.7 — ABNORMAL LOW
BUN: 42 — ABNORMAL HIGH
Calcium: 7.6 — ABNORMAL LOW
Calcium: 8.3 — ABNORMAL LOW
Creatinine, Ser: 3.1 — ABNORMAL HIGH
GFR calc Af Amer: 28 — ABNORMAL LOW
Glucose, Bld: 117 — ABNORMAL HIGH
Glucose, Bld: 157 — ABNORMAL HIGH
Potassium: 3.9
Sodium: 136
Total Protein: 5.4 — ABNORMAL LOW
Total Protein: 6.8

## 2011-05-10 LAB — DIFFERENTIAL
Basophils Absolute: 0.1
Basophils Relative: 0
Eosinophils Absolute: 0.2
Eosinophils Relative: 3
Lymphocytes Relative: 6 — ABNORMAL LOW
Lymphs Abs: 0.7
Monocytes Absolute: 0.5
Monocytes Relative: 4
Neutro Abs: 10.9 — ABNORMAL HIGH
Neutrophils Relative %: 90 — ABNORMAL HIGH

## 2011-05-10 LAB — STOOL CULTURE

## 2011-05-10 LAB — CULTURE, BLOOD (ROUTINE X 2)
Culture: NO GROWTH
Culture: NO GROWTH

## 2011-05-10 LAB — BILIRUBIN, FRACTIONATED(TOT/DIR/INDIR): Indirect Bilirubin: 1 — ABNORMAL HIGH

## 2011-05-10 LAB — LIPID PANEL
Cholesterol: 151
Total CHOL/HDL Ratio: 7.9

## 2011-05-10 LAB — OVA AND PARASITE EXAMINATION

## 2011-05-10 LAB — AMYLASE: Amylase: 50

## 2011-05-10 LAB — PROTIME-INR: INR: 1.2

## 2011-05-17 LAB — BASIC METABOLIC PANEL
BUN: 19
CO2: 25
Calcium: 8.8
Chloride: 103
Creatinine, Ser: 1.32
GFR calc Af Amer: >60
GFR calc non Af Amer: 55 — ABNORMAL LOW
Glucose, Bld: 176 — ABNORMAL HIGH
Potassium: 4.1
Sodium: 135

## 2011-07-26 ENCOUNTER — Other Ambulatory Visit (HOSPITAL_COMMUNITY): Payer: Self-pay | Admitting: Internal Medicine

## 2011-07-26 DIAGNOSIS — R110 Nausea: Secondary | ICD-10-CM

## 2011-08-04 DIAGNOSIS — I959 Hypotension, unspecified: Secondary | ICD-10-CM | POA: Diagnosis not present

## 2011-08-04 DIAGNOSIS — R0609 Other forms of dyspnea: Secondary | ICD-10-CM | POA: Diagnosis not present

## 2011-08-04 DIAGNOSIS — R1013 Epigastric pain: Secondary | ICD-10-CM | POA: Diagnosis not present

## 2011-08-06 ENCOUNTER — Other Ambulatory Visit (HOSPITAL_COMMUNITY): Payer: Medicare Other

## 2011-08-12 DIAGNOSIS — I10 Essential (primary) hypertension: Secondary | ICD-10-CM | POA: Diagnosis not present

## 2011-08-25 ENCOUNTER — Encounter (HOSPITAL_COMMUNITY): Payer: Self-pay | Admitting: *Deleted

## 2011-08-26 ENCOUNTER — Other Ambulatory Visit: Payer: Self-pay | Admitting: Gastroenterology

## 2011-08-26 ENCOUNTER — Encounter (HOSPITAL_COMMUNITY)
Admission: RE | Disposition: A | Discharge status: Home / Self Care | Payer: Self-pay | Source: Ambulatory Visit | Attending: Gastroenterology

## 2011-08-26 ENCOUNTER — Encounter (HOSPITAL_COMMUNITY): Payer: Self-pay

## 2011-08-26 ENCOUNTER — Ambulatory Visit (HOSPITAL_COMMUNITY)
Admission: RE | Admit: 2011-08-26 | Discharge: 2011-08-26 | Disposition: A | Discharge status: Home / Self Care | Payer: Medicare Other | Source: Ambulatory Visit | Attending: Gastroenterology | Admitting: Gastroenterology

## 2011-08-26 DIAGNOSIS — R1013 Epigastric pain: Secondary | ICD-10-CM | POA: Diagnosis not present

## 2011-08-26 DIAGNOSIS — R6881 Early satiety: Secondary | ICD-10-CM | POA: Diagnosis not present

## 2011-08-26 DIAGNOSIS — D1330 Benign neoplasm of unspecified part of small intestine: Secondary | ICD-10-CM | POA: Diagnosis not present

## 2011-08-26 HISTORY — DX: Sleep apnea, unspecified: G47.30

## 2011-08-26 HISTORY — DX: Essential (primary) hypertension: I10

## 2011-08-26 HISTORY — PX: ESOPHAGOGASTRODUODENOSCOPY: SHX5428

## 2011-08-26 HISTORY — DX: Unspecified osteoarthritis, unspecified site: M19.90

## 2011-08-26 SURGERY — EGD (ESOPHAGOGASTRODUODENOSCOPY)
Anesthesia: Moderate Sedation

## 2011-08-26 MED ORDER — BUTAMBEN-TETRACAINE-BENZOCAINE 2-2-14 % EX AERO
INHALATION_SPRAY | CUTANEOUS | Status: DC | PRN
  Administered 2011-08-26: 1 via TOPICAL

## 2011-08-26 MED ORDER — MIDAZOLAM HCL 10 MG/2ML IJ SOLN
INTRAMUSCULAR | Status: DC | PRN
  Administered 2011-08-26 (×2): 2.5 mg via INTRAVENOUS

## 2011-08-26 MED ORDER — SODIUM CHLORIDE 0.9 % IV SOLN
Freq: Once | INTRAVENOUS | Status: AC
  Administered 2011-08-26: 500 mL via INTRAVENOUS

## 2011-08-26 MED ORDER — FENTANYL NICU IV SYRINGE 50 MCG/ML
INJECTION | INTRAMUSCULAR | Status: DC | PRN
  Administered 2011-08-26 (×3): 25 ug via INTRAVENOUS

## 2011-08-26 MED ORDER — FENTANYL CITRATE 0.05 MG/ML IJ SOLN
INTRAMUSCULAR | Status: AC
  Filled 2011-08-26: qty 2

## 2011-08-26 MED ORDER — MIDAZOLAM HCL 10 MG/2ML IJ SOLN
INTRAMUSCULAR | Status: AC
  Filled 2011-08-26: qty 2

## 2011-08-26 NOTE — H&P (Signed)
Problem: Epigastric pain and early satiety  History: The patient is a 71 year old male with unexplained epigastric discomfort and early satiety. He denies vomiting. 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  In 2012, CT scan of the abdomen and pelvis showed a left inguinal hernia but no other significant abnormality. 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  In 2010, the patient underwent an endoscopic ultrasound which was consistent with chronic pancreatitis. The main pancreatic duct was normal. There was no pancreatic tumor present.  The patient underwent a normal surveillance colonoscopy in 2010; he should undergo a surveillance colonoscopy in 2015.  The patient tried taking pancreatic enzymes without improvement in his epigastric discomfort and early satiety.  The patient is scheduled to undergo a diagnostic esophagogastroduodenoscopy. If his esophagogastroduodenoscopy is normal, I will refer him for a repeat endoscopic ultrasound to rule out pancreatic ductal obstruction and pancreatic cancer.  Medication allergies: ACE inhibitor as cause cough.  Chronic medications: Zyrtec, amitriptyline, ranitidine.  Past medical and surgical history: Appendectomy, knee arthroscopy for Baker's cyst on 3 occasions, microdiscectomy for herniated disc, cholecystectomy, colonoscopy with colon polyp removal,  duodenal adenoma removal endoscopically, lumbar fusion, stage III chronic kidney disease, hypertension, gastroesophageal reflux disease, benign prostatic hypertrophy, chronic pancreatitis by endoscopic ultrasound in 2010.  Exam: The patient is alert and lying comfortably on the stretcher. Sclera are nonicteric. Mouth and throat appear normal. Lungs are clear to auscultation. Cardiac exam reveals a regular. Abdomen is soft, flat, and nontender to  palpation.  Plan: Proceed with diagnostic esophagogastroduodenoscopy.

## 2011-08-26 NOTE — Op Note (Signed)
Procedure: Diagnostic esophagogastroduodenoscopy with duodenal bulb biopsies.  Indication: [name] is a 71 year old male born [birth year]. The patient has unexplained epigastric discomfort and early satiety. In 2010, he underwent an endoscopic ultrasound which was consistent with chronic pancreatitis associated with a normal pancreatic duct. 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  Endoscopist: Earle Gell  Premedication: Fentanyl 75 mcg intravenously. Versed 5 mg intravenously.  Procedure: The patient was placed in the left lateral decubitus position. The Pentax gastroscope was passed through the posterior hypopharynx into the proximal esophagus without difficulty. The hypopharynx, larynx, and vocal cords appeared normal.  Esophagoscopy: The proximal, mid, and lower segments of the esophageal mucosa appear normal. Squamocolumnar junction is regular and noted at 40 cm from the incisor teeth.  Gastroscopy: Retroflex view of the gastric cardia and fundus was normal. The diaphragmatic hiatus was somewhat patulous. The gastric body, antrum, and pylorus appeared normal. The pylorus is widely patent.  Duodenoscopy: The duodenal bulb, second portion of the duodenum, and third portion of duodenum appeared normal. Biopsies were taken from the duodenal bulb to rule out adenomatous tissue.  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 Duodenal bulb biopsies to rule out neoplastic tissue pending.  Recommendations: Proceed with endoscopic ultrasound.

## 2011-08-27 ENCOUNTER — Encounter (HOSPITAL_COMMUNITY): Payer: Self-pay | Admitting: Gastroenterology

## 2011-08-31 DIAGNOSIS — I10 Essential (primary) hypertension: Secondary | ICD-10-CM | POA: Diagnosis not present

## 2011-09-09 ENCOUNTER — Encounter (HOSPITAL_COMMUNITY): Payer: Self-pay

## 2011-09-15 ENCOUNTER — Ambulatory Visit (HOSPITAL_COMMUNITY)
Admission: RE | Admit: 2011-09-15 | Discharge: 2011-09-15 | Disposition: A | Discharge status: Home / Self Care | Payer: Medicare Other | Source: Ambulatory Visit | Attending: Gastroenterology | Admitting: Gastroenterology

## 2011-09-15 ENCOUNTER — Ambulatory Visit (HOSPITAL_COMMUNITY): Payer: Medicare Other | Admitting: Anesthesiology

## 2011-09-15 ENCOUNTER — Encounter (HOSPITAL_COMMUNITY): Payer: Self-pay | Admitting: Anesthesiology

## 2011-09-15 ENCOUNTER — Encounter (HOSPITAL_COMMUNITY)
Admission: RE | Disposition: A | Discharge status: Home / Self Care | Payer: Self-pay | Source: Ambulatory Visit | Attending: Gastroenterology

## 2011-09-15 DIAGNOSIS — R109 Unspecified abdominal pain: Secondary | ICD-10-CM | POA: Diagnosis not present

## 2011-09-15 DIAGNOSIS — R1013 Epigastric pain: Secondary | ICD-10-CM | POA: Diagnosis not present

## 2011-09-15 DIAGNOSIS — R634 Abnormal weight loss: Secondary | ICD-10-CM | POA: Diagnosis not present

## 2011-09-15 DIAGNOSIS — K861 Other chronic pancreatitis: Secondary | ICD-10-CM | POA: Diagnosis not present

## 2011-09-15 DIAGNOSIS — Z9089 Acquired absence of other organs: Secondary | ICD-10-CM | POA: Insufficient documentation

## 2011-09-15 HISTORY — PX: EUS: SHX5427

## 2011-09-15 SURGERY — UPPER ENDOSCOPIC ULTRASOUND (EUS) LINEAR
Anesthesia: Monitor Anesthesia Care

## 2011-09-15 MED ORDER — LACTATED RINGERS IV SOLN
INTRAVENOUS | Status: DC
  Administered 2011-09-15: 1000 mL via INTRAVENOUS

## 2011-09-15 MED ORDER — MIDAZOLAM HCL 5 MG/5ML IJ SOLN
INTRAMUSCULAR | Status: DC | PRN
  Administered 2011-09-15: 2 mg via INTRAVENOUS

## 2011-09-15 MED ORDER — BUTAMBEN-TETRACAINE-BENZOCAINE 2-2-14 % EX AERO
INHALATION_SPRAY | CUTANEOUS | Status: DC | PRN
  Administered 2011-09-15: 2 via TOPICAL

## 2011-09-15 MED ORDER — FENTANYL CITRATE 0.05 MG/ML IJ SOLN
INTRAMUSCULAR | Status: DC | PRN
  Administered 2011-09-15: 100 ug via INTRAVENOUS

## 2011-09-15 MED ORDER — PROPOFOL 10 MG/ML IV EMUL
INTRAVENOUS | Status: DC | PRN
  Administered 2011-09-15: 140 ug/kg/min via INTRAVENOUS

## 2011-09-15 NOTE — Transfer of Care (Signed)
Immediate Anesthesia Transfer of Care Note  Patient: Robert Dickerson  Procedure(s) Performed: Procedure(s) (LRB): UPPER ENDOSCOPIC ULTRASOUND (EUS) LINEAR (N/A)  Patient Location: PACU  Anesthesia Type: MAC  Level of Consciousness: awake, sedated and patient cooperative  Airway & Oxygen Therapy: Patient Spontanous Breathing and Patient connected to nasal cannula oxygen  Post-op Assessment: Report given to PACU RN and Post -op Vital signs reviewed and stable  Post vital signs: Reviewed and stable  Complications: No apparent anesthesia complications

## 2011-09-15 NOTE — H&P (View-Only) (Signed)
Problem: Epigastric pain and early satiety  History: The patient is a 71 year old male with unexplained epigastric discomfort and early satiety. He denies vomiting. 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  In 2012, CT scan of the abdomen and pelvis showed a left inguinal hernia but no other significant abnormality. 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  In 2010, the patient underwent an endoscopic ultrasound which was consistent with chronic pancreatitis. The main pancreatic duct was normal. There was no pancreatic tumor present.  The patient underwent a normal surveillance colonoscopy in 2010; he should undergo a surveillance colonoscopy in 2015.  The patient tried taking pancreatic enzymes without improvement in his epigastric discomfort and early satiety.  The patient is scheduled to undergo a diagnostic esophagogastroduodenoscopy. If his esophagogastroduodenoscopy is normal, I will refer him for a repeat endoscopic ultrasound to rule out pancreatic ductal obstruction and pancreatic cancer.  Medication allergies: ACE inhibitor as cause cough.  Chronic medications: Zyrtec, amitriptyline, ranitidine.  Past medical and surgical history: Appendectomy, knee arthroscopy for Baker's cyst on 3 occasions, microdiscectomy for herniated disc, cholecystectomy, colonoscopy with colon polyp removal,  duodenal adenoma removal endoscopically, lumbar fusion, stage III chronic kidney disease, hypertension, gastroesophageal reflux disease, benign prostatic hypertrophy, chronic pancreatitis by endoscopic ultrasound in 2010.  Exam: The patient is alert and lying comfortably on the stretcher. Sclera are nonicteric. Mouth and throat appear normal. Lungs are clear to auscultation. Cardiac exam reveals a regular. Abdomen is soft, flat, and nontender to  palpation.  Plan: Proceed with diagnostic esophagogastroduodenoscopy.

## 2011-09-15 NOTE — Brief Op Note (Signed)
Please see [facility] note dated 09/15/11.

## 2011-09-15 NOTE — Interval H&P Note (Signed)
History and Physical Interval Note:  09/15/2011 9:26 AM  [name]  has presented today for surgery, with the diagnosis of pancreatic  The various methods of treatment have been discussed with the patient and family. After consideration of risks, benefits and other options for treatment, the patient has consented to  Procedure(s) (LRB): UPPER ENDOSCOPIC ULTRASOUND (EUS) LINEAR (N/A) as a surgical intervention .  The patients' history has been reviewed, patient examined, no change in status, stable for surgery.  I have reviewed the patients' chart and labs.  Questions were answered to the patient's satisfaction.     Landry Dyke  Risks (bleeding, infection, bowel perforation that could require surgery, sedation-related changes in cardiopulmonary systems), benefits (identification and possible treatment of source of symptoms, exclusion of certain causes of symptoms), and alternatives (watchful waiting, radiographic imaging studies, empiric medical treatment) of upper endoscopy with ultrasound (EUS) were explained to patient in detail and he wishes to proceed.

## 2011-09-15 NOTE — Preoperative (Signed)
Beta Blockers   Reason not to administer Beta Blockers:Not Applicable 

## 2011-09-15 NOTE — Anesthesia Preprocedure Evaluation (Signed)
Anesthesia Evaluation  Patient identified by MRN, date of birth, ID band Patient awake    Reviewed: Allergy & Precautions, H&P , NPO status , Patient's Chart, lab work & pertinent test results, reviewed documented beta blocker date and time   Airway Mallampati: II TM Distance: >3 FB Neck ROM: Full    Dental  (+) Dental Advisory Given and Teeth Intact   Pulmonary neg pulmonary ROS,  clear to auscultation        Cardiovascular hypertension, Pt. on medications Regular Normal Denies cardiac symptoms   Neuro/Psych Negative Neurological ROS  Negative Psych ROS   GI/Hepatic Neg liver ROS, pancreatitis   Endo/Other  Negative Endocrine ROS  Renal/GU negative Renal ROS  Genitourinary negative   Musculoskeletal negative musculoskeletal ROS (+)   Abdominal   Peds negative pediatric ROS (+)  Hematology negative hematology ROS (+)   Anesthesia Other Findings Caps in back  Reproductive/Obstetrics negative OB ROS                           Anesthesia Physical Anesthesia Plan  ASA: III  Anesthesia Plan: MAC   Post-op Pain Management:    Induction: Intravenous  Airway Management Planned: Mask  Additional Equipment:   Intra-op Plan:   Post-operative Plan:   Informed Consent: I have reviewed the patients History and Physical, chart, labs and discussed the procedure including the risks, benefits and alternatives for the proposed anesthesia with the patient or authorized representative who has indicated his/her understanding and acceptance.   Dental advisory given  Plan Discussed with: CRNA and Surgeon  Anesthesia Plan Comments:         Anesthesia Quick Evaluation

## 2011-09-15 NOTE — Discharge Instructions (Signed)
Endoscopic Ultrasound (EUS)  Care After  Please read the instructions outlined below and refer to this sheet in the next few weeks. These discharge instructions provide you with general information on caring for yourself after you leave the hospital. Your doctor may also give you specific instructions. While your treatment has been planned according to the most current medical practices available, unavoidable complications occasionally occur. If you have any problems or questions after discharge, please call Dr. Palmina Clodfelter ([facility]) at [phone].  HOME CARE INSTRUCTIONS  Activity You may resume your regular activity but move at a slower pace for the next 24 hours.  Take frequent rest periods for the next 24 hours.  Walking will help expel (get rid of) the air and reduce the bloated feeling in your abdomen.  No driving for 24 hours (because of the anesthesia (medicine) used during the test).  You may shower.  Do not sign any important legal documents or operate any machinery for 24 hours (because of the anesthesia used during the test).  Nutrition Drink plenty of fluids.  You may resume your normal diet.  Begin with a light meal and progress to your normal diet.  Avoid alcoholic beverages for 24 hours or as instructed by your caregiver.   Medications You may resume your normal medications unless your caregiver tells you otherwise.  What you can expect today You may experience abdominal discomfort such as a feeling of fullness or "gas" pains.  You may experience a sore throat for 2 to 3 days. This is normal. Gargling with salt water may help this.   SEEK IMMEDIATE MEDICAL CARE IF: You have excessive nausea (feeling sick to your stomach) and/or vomiting.  You have severe abdominal pain and distention (swelling).  You have trouble swallowing.  You have a temperature over 100 F (37.8 C).  You have rectal bleeding or vomiting of blood.   Document Released: 03/02/2004  Document Revised: 03/31/2011 Document Reviewed: 09/13/2007 Exit[employer] Patient Information 2012 [employer].  

## 2011-09-15 NOTE — Op Note (Signed)
[facility] [place], [place]  [id number]  ENDOSCOPIC ULTRASOUND PROCEDURE REPORT  PATIENT:  Robert Dickerson, Robert Dickerson  MR#:  [id number] BIRTHDATE:  18-Jun-1941  GENDER:  male  ENDOSCOPIST:  Arta Silence, MD REFERRED BY:  Earle Gell, M.D. Lavone Orn, M.D.  PROCEDURE DATE:  09/15/2011 PROCEDURE:  Upper EUS ASA CLASS:  Class III INDICATIONS:  abdominal pain, weight loss, history chronic pancreatitis  MEDICATIONS:   Cetacaine spray x 2, MAC sedation, administered by CRNA  DESCRIPTION OF PROCEDURE:   After the risks benefits and alternatives of the procedure were  explained, informed consent was obtained. The patient was then placed in the left, lateral, decubitus postion and IV sedation was administered. Throughout the procedure, the patient's blood pressure, pulse and oxygen saturations were monitored continuously.  Under direct visualization, the Pentax Radial EUS [id number] endoscope was introduced through the mouth and advanced to the second portion of the duodenum.  Water was used as necessary to provide an acoustic interface.  Upon completion of the imaging, water was removed and the patient was sent to the recovery room in satisfactory condition.  <<PROCEDUREIMAGES>>  FINDINGS:  Pancreatic parenchyma of the head, uncinate, genu, body and tail of the pancreas was distinctly abnormal.  There were extensive hyperechoic strands/foci, visible pancreatic ductal side branches, and lobularity.  Findings all consistent with chronic pancreatitis. No focal pancreatic mass was seen, although the sensitivity of EUS to detect pancreatic masses in setting of chronic pancreatitis is relatively diminished.  Non-dilated and otherwise normal-appearing bile duct without wall thickening or choledocholithiasis.  Pos-cholecystectomy.  Normal-appearing ampulla via EUS.  ENDOSCOPIC IMPRESSION:      1.  Extensive changes of chronic pancreatitis. 2.  Post-cholecystectomy. 3.   No obvious pancreatic mass, with decreased EUS-related sensitivity to detect               pancreatic lesions in setting of chronic pancreatitis, as described above.  RECOMMENDATIONS:        1.  Watch for potential complications of procedure. 2.  Consider addition of pancreatic enzymes.  If this is unhelpful, might consider               intensification of antisecretory therapy. 3.  Will discuss with Dr. Wynetta Emery.  ______________________________ Arta Silence  CC:  n. eSIGNEDArta Silence at 09/15/2011 10:03 AM  Venita Sheffield, [id number]

## 2011-09-15 NOTE — Anesthesia Postprocedure Evaluation (Signed)
  Anesthesia Post-op Note  Patient: Robert Dickerson  Procedure(s) Performed: Procedure(s) (LRB): UPPER ENDOSCOPIC ULTRASOUND (EUS) LINEAR (N/A)  Patient Location: PACU  Anesthesia Type: MAC  Level of Consciousness: oriented and sedated  Airway and Oxygen Therapy: Patient Spontanous Breathing  Post-op Pain: none  Post-op Assessment: Post-op Vital signs reviewed, Patient's Cardiovascular Status Stable, Respiratory Function Stable and Patent Airway  Post-op Vital Signs: stable  Complications: No apparent anesthesia complications

## 2011-09-16 ENCOUNTER — Encounter (HOSPITAL_COMMUNITY): Payer: Self-pay | Admitting: Gastroenterology

## 2011-10-01 DIAGNOSIS — K861 Other chronic pancreatitis: Secondary | ICD-10-CM | POA: Diagnosis not present

## 2011-10-25 DIAGNOSIS — M1710 Unilateral primary osteoarthritis, unspecified knee: Secondary | ICD-10-CM | POA: Diagnosis not present

## 2011-11-24 DIAGNOSIS — I10 Essential (primary) hypertension: Secondary | ICD-10-CM | POA: Diagnosis not present

## 2011-12-16 DIAGNOSIS — M25559 Pain in unspecified hip: Secondary | ICD-10-CM | POA: Diagnosis not present

## 2012-01-06 DIAGNOSIS — M25559 Pain in unspecified hip: Secondary | ICD-10-CM | POA: Diagnosis not present

## 2012-01-11 DIAGNOSIS — M25559 Pain in unspecified hip: Secondary | ICD-10-CM | POA: Diagnosis not present

## 2012-01-11 DIAGNOSIS — M169 Osteoarthritis of hip, unspecified: Secondary | ICD-10-CM | POA: Diagnosis not present

## 2012-02-01 DIAGNOSIS — M25559 Pain in unspecified hip: Secondary | ICD-10-CM | POA: Diagnosis not present

## 2012-02-21 DIAGNOSIS — M545 Low back pain: Secondary | ICD-10-CM | POA: Diagnosis not present

## 2012-05-17 DIAGNOSIS — Z23 Encounter for immunization: Secondary | ICD-10-CM | POA: Diagnosis not present

## 2012-05-18 DIAGNOSIS — M76899 Other specified enthesopathies of unspecified lower limb, excluding foot: Secondary | ICD-10-CM | POA: Diagnosis not present

## 2012-05-18 DIAGNOSIS — M1710 Unilateral primary osteoarthritis, unspecified knee: Secondary | ICD-10-CM | POA: Diagnosis not present

## 2012-05-25 DIAGNOSIS — M1710 Unilateral primary osteoarthritis, unspecified knee: Secondary | ICD-10-CM | POA: Diagnosis not present

## 2012-05-25 DIAGNOSIS — M76899 Other specified enthesopathies of unspecified lower limb, excluding foot: Secondary | ICD-10-CM | POA: Diagnosis not present

## 2012-06-01 DIAGNOSIS — M1710 Unilateral primary osteoarthritis, unspecified knee: Secondary | ICD-10-CM | POA: Diagnosis not present

## 2012-06-02 DIAGNOSIS — Z125 Encounter for screening for malignant neoplasm of prostate: Secondary | ICD-10-CM | POA: Diagnosis not present

## 2012-06-02 DIAGNOSIS — Z0000 Encounter for general adult medical examination without abnormal findings: Secondary | ICD-10-CM | POA: Diagnosis not present

## 2012-06-02 DIAGNOSIS — Z1331 Encounter for screening for depression: Secondary | ICD-10-CM | POA: Diagnosis not present

## 2012-06-02 DIAGNOSIS — I10 Essential (primary) hypertension: Secondary | ICD-10-CM | POA: Diagnosis not present

## 2012-06-02 DIAGNOSIS — K219 Gastro-esophageal reflux disease without esophagitis: Secondary | ICD-10-CM | POA: Diagnosis not present

## 2012-07-04 DIAGNOSIS — I10 Essential (primary) hypertension: Secondary | ICD-10-CM | POA: Diagnosis not present

## 2012-08-10 DIAGNOSIS — J3489 Other specified disorders of nose and nasal sinuses: Secondary | ICD-10-CM | POA: Diagnosis not present

## 2012-08-10 DIAGNOSIS — I10 Essential (primary) hypertension: Secondary | ICD-10-CM | POA: Diagnosis not present

## 2012-09-13 DIAGNOSIS — B369 Superficial mycosis, unspecified: Secondary | ICD-10-CM | POA: Diagnosis not present

## 2012-09-13 DIAGNOSIS — H6240 Otitis externa in other diseases classified elsewhere, unspecified ear: Secondary | ICD-10-CM | POA: Diagnosis not present

## 2012-09-13 DIAGNOSIS — H6120 Impacted cerumen, unspecified ear: Secondary | ICD-10-CM | POA: Diagnosis not present

## 2012-09-27 DIAGNOSIS — B369 Superficial mycosis, unspecified: Secondary | ICD-10-CM | POA: Diagnosis not present

## 2012-09-27 DIAGNOSIS — H6120 Impacted cerumen, unspecified ear: Secondary | ICD-10-CM | POA: Diagnosis not present

## 2013-01-01 DIAGNOSIS — M1710 Unilateral primary osteoarthritis, unspecified knee: Secondary | ICD-10-CM | POA: Diagnosis not present

## 2013-01-03 DIAGNOSIS — M76899 Other specified enthesopathies of unspecified lower limb, excluding foot: Secondary | ICD-10-CM | POA: Diagnosis not present

## 2013-01-08 DIAGNOSIS — L03119 Cellulitis of unspecified part of limb: Secondary | ICD-10-CM | POA: Diagnosis not present

## 2013-01-08 DIAGNOSIS — L02419 Cutaneous abscess of limb, unspecified: Secondary | ICD-10-CM | POA: Diagnosis not present

## 2013-02-08 DIAGNOSIS — K219 Gastro-esophageal reflux disease without esophagitis: Secondary | ICD-10-CM | POA: Diagnosis not present

## 2013-02-08 DIAGNOSIS — I10 Essential (primary) hypertension: Secondary | ICD-10-CM | POA: Diagnosis not present

## 2013-02-08 DIAGNOSIS — R0609 Other forms of dyspnea: Secondary | ICD-10-CM | POA: Diagnosis not present

## 2013-02-23 DIAGNOSIS — M545 Low back pain: Secondary | ICD-10-CM | POA: Diagnosis not present

## 2013-03-01 DIAGNOSIS — M545 Low back pain: Secondary | ICD-10-CM | POA: Diagnosis not present

## 2013-03-05 DIAGNOSIS — M545 Low back pain: Secondary | ICD-10-CM | POA: Diagnosis not present

## 2013-03-07 DIAGNOSIS — M545 Low back pain: Secondary | ICD-10-CM | POA: Diagnosis not present

## 2013-03-13 DIAGNOSIS — M545 Low back pain: Secondary | ICD-10-CM | POA: Diagnosis not present

## 2013-03-15 DIAGNOSIS — M545 Low back pain: Secondary | ICD-10-CM | POA: Diagnosis not present

## 2013-03-22 DIAGNOSIS — M545 Low back pain: Secondary | ICD-10-CM | POA: Diagnosis not present

## 2013-03-27 DIAGNOSIS — M545 Low back pain: Secondary | ICD-10-CM | POA: Diagnosis not present

## 2013-03-29 DIAGNOSIS — M545 Low back pain: Secondary | ICD-10-CM | POA: Diagnosis not present

## 2013-04-06 DIAGNOSIS — M545 Low back pain: Secondary | ICD-10-CM | POA: Diagnosis not present

## 2013-05-07 DIAGNOSIS — Z23 Encounter for immunization: Secondary | ICD-10-CM | POA: Diagnosis not present

## 2013-05-24 DIAGNOSIS — M1710 Unilateral primary osteoarthritis, unspecified knee: Secondary | ICD-10-CM | POA: Diagnosis not present

## 2013-05-31 DIAGNOSIS — M1710 Unilateral primary osteoarthritis, unspecified knee: Secondary | ICD-10-CM | POA: Diagnosis not present

## 2013-05-31 DIAGNOSIS — M25549 Pain in joints of unspecified hand: Secondary | ICD-10-CM | POA: Diagnosis not present

## 2013-06-05 DIAGNOSIS — M1710 Unilateral primary osteoarthritis, unspecified knee: Secondary | ICD-10-CM | POA: Diagnosis not present

## 2013-08-14 ENCOUNTER — Other Ambulatory Visit: Payer: Self-pay | Admitting: Internal Medicine

## 2013-08-14 ENCOUNTER — Ambulatory Visit
Admission: RE | Admit: 2013-08-14 | Discharge: 2013-08-14 | Disposition: A | Discharge status: Home / Self Care | Payer: Medicare Other | Source: Ambulatory Visit | Attending: Internal Medicine | Admitting: Internal Medicine

## 2013-08-14 DIAGNOSIS — I10 Essential (primary) hypertension: Secondary | ICD-10-CM | POA: Diagnosis not present

## 2013-08-14 DIAGNOSIS — R0609 Other forms of dyspnea: Secondary | ICD-10-CM

## 2013-08-14 DIAGNOSIS — K219 Gastro-esophageal reflux disease without esophagitis: Secondary | ICD-10-CM | POA: Diagnosis not present

## 2013-08-14 DIAGNOSIS — R0602 Shortness of breath: Secondary | ICD-10-CM | POA: Diagnosis not present

## 2013-08-14 DIAGNOSIS — R0989 Other specified symptoms and signs involving the circulatory and respiratory systems: Principal | ICD-10-CM

## 2013-08-14 DIAGNOSIS — R404 Transient alteration of awareness: Secondary | ICD-10-CM | POA: Diagnosis not present

## 2013-08-14 DIAGNOSIS — Z1331 Encounter for screening for depression: Secondary | ICD-10-CM | POA: Diagnosis not present

## 2013-08-14 DIAGNOSIS — N183 Chronic kidney disease, stage 3 unspecified: Secondary | ICD-10-CM | POA: Diagnosis not present

## 2013-09-04 ENCOUNTER — Other Ambulatory Visit (HOSPITAL_COMMUNITY): Payer: Self-pay | Admitting: Internal Medicine

## 2013-09-04 ENCOUNTER — Other Ambulatory Visit (HOSPITAL_COMMUNITY): Payer: Medicare Other | Admitting: Cardiology

## 2013-09-04 ENCOUNTER — Ambulatory Visit (HOSPITAL_COMMUNITY): Payer: Medicare Other | Attending: Internal Medicine | Admitting: Radiology

## 2013-09-04 DIAGNOSIS — R0602 Shortness of breath: Secondary | ICD-10-CM

## 2013-09-04 DIAGNOSIS — I059 Rheumatic mitral valve disease, unspecified: Secondary | ICD-10-CM | POA: Insufficient documentation

## 2013-09-04 DIAGNOSIS — I10 Essential (primary) hypertension: Secondary | ICD-10-CM | POA: Diagnosis not present

## 2013-09-04 MED ORDER — PERFLUTREN PROTEIN A MICROSPH IV SUSP
1.5000 mL | Freq: Once | INTRAVENOUS | Status: AC
  Administered 2013-09-04: 1.5 mL via INTRAVENOUS

## 2013-09-04 NOTE — Progress Notes (Signed)
Echocardiogram performed with [employer].  

## 2013-09-12 DIAGNOSIS — G4733 Obstructive sleep apnea (adult) (pediatric): Secondary | ICD-10-CM | POA: Diagnosis not present

## 2013-11-12 DIAGNOSIS — Z23 Encounter for immunization: Secondary | ICD-10-CM | POA: Diagnosis not present

## 2013-11-12 DIAGNOSIS — I10 Essential (primary) hypertension: Secondary | ICD-10-CM | POA: Diagnosis not present

## 2013-11-12 DIAGNOSIS — G4733 Obstructive sleep apnea (adult) (pediatric): Secondary | ICD-10-CM | POA: Diagnosis not present

## 2013-11-12 DIAGNOSIS — B356 Tinea cruris: Secondary | ICD-10-CM | POA: Diagnosis not present

## 2013-12-12 DIAGNOSIS — M76899 Other specified enthesopathies of unspecified lower limb, excluding foot: Secondary | ICD-10-CM | POA: Diagnosis not present

## 2013-12-17 DIAGNOSIS — G4733 Obstructive sleep apnea (adult) (pediatric): Secondary | ICD-10-CM | POA: Diagnosis not present

## 2013-12-17 DIAGNOSIS — M25569 Pain in unspecified knee: Secondary | ICD-10-CM | POA: Diagnosis not present

## 2013-12-17 DIAGNOSIS — I10 Essential (primary) hypertension: Secondary | ICD-10-CM | POA: Diagnosis not present

## 2014-03-04 ENCOUNTER — Other Ambulatory Visit: Payer: Self-pay | Admitting: Gastroenterology

## 2014-04-25 DIAGNOSIS — Z23 Encounter for immunization: Secondary | ICD-10-CM | POA: Diagnosis not present

## 2014-05-01 DIAGNOSIS — H2510 Age-related nuclear cataract, unspecified eye: Secondary | ICD-10-CM | POA: Diagnosis not present

## 2014-05-21 ENCOUNTER — Encounter (HOSPITAL_COMMUNITY): Payer: Self-pay | Admitting: Pharmacy Technician

## 2014-05-29 ENCOUNTER — Encounter (HOSPITAL_COMMUNITY): Payer: Self-pay | Admitting: *Deleted

## 2014-05-29 DIAGNOSIS — Z1389 Encounter for screening for other disorder: Secondary | ICD-10-CM | POA: Diagnosis not present

## 2014-05-29 DIAGNOSIS — I129 Hypertensive chronic kidney disease with stage 1 through stage 4 chronic kidney disease, or unspecified chronic kidney disease: Secondary | ICD-10-CM | POA: Diagnosis not present

## 2014-05-29 DIAGNOSIS — K219 Gastro-esophageal reflux disease without esophagitis: Secondary | ICD-10-CM | POA: Diagnosis not present

## 2014-05-29 DIAGNOSIS — E669 Obesity, unspecified: Secondary | ICD-10-CM | POA: Diagnosis not present

## 2014-05-29 DIAGNOSIS — R0609 Other forms of dyspnea: Secondary | ICD-10-CM | POA: Diagnosis not present

## 2014-05-29 DIAGNOSIS — Z0000 Encounter for general adult medical examination without abnormal findings: Secondary | ICD-10-CM | POA: Diagnosis not present

## 2014-05-29 DIAGNOSIS — Z13220 Encounter for screening for lipoid disorders: Secondary | ICD-10-CM | POA: Diagnosis not present

## 2014-05-29 DIAGNOSIS — G4733 Obstructive sleep apnea (adult) (pediatric): Secondary | ICD-10-CM | POA: Diagnosis not present

## 2014-05-29 DIAGNOSIS — I519 Heart disease, unspecified: Secondary | ICD-10-CM | POA: Diagnosis not present

## 2014-05-29 DIAGNOSIS — N183 Chronic kidney disease, stage 3 (moderate): Secondary | ICD-10-CM | POA: Diagnosis not present

## 2014-06-10 ENCOUNTER — Other Ambulatory Visit: Payer: Self-pay | Admitting: Gastroenterology

## 2014-06-11 ENCOUNTER — Ambulatory Visit (HOSPITAL_COMMUNITY): Payer: Medicare Other | Admitting: Registered Nurse

## 2014-06-11 ENCOUNTER — Encounter (HOSPITAL_COMMUNITY): Payer: Self-pay | Admitting: *Deleted

## 2014-06-11 ENCOUNTER — Ambulatory Visit (HOSPITAL_COMMUNITY)
Admission: RE | Admit: 2014-06-11 | Discharge: 2014-06-11 | Disposition: A | Discharge status: Home / Self Care | Payer: Medicare Other | Source: Ambulatory Visit | Attending: Gastroenterology | Admitting: Gastroenterology

## 2014-06-11 ENCOUNTER — Encounter (HOSPITAL_COMMUNITY)
Admission: RE | Disposition: A | Discharge status: Home / Self Care | Payer: Self-pay | Source: Ambulatory Visit | Attending: Gastroenterology

## 2014-06-11 DIAGNOSIS — Z87891 Personal history of nicotine dependence: Secondary | ICD-10-CM | POA: Insufficient documentation

## 2014-06-11 DIAGNOSIS — M1990 Unspecified osteoarthritis, unspecified site: Secondary | ICD-10-CM | POA: Diagnosis not present

## 2014-06-11 DIAGNOSIS — Z1211 Encounter for screening for malignant neoplasm of colon: Secondary | ICD-10-CM | POA: Insufficient documentation

## 2014-06-11 DIAGNOSIS — Z86010 Personal history of colonic polyps: Secondary | ICD-10-CM | POA: Insufficient documentation

## 2014-06-11 DIAGNOSIS — G4730 Sleep apnea, unspecified: Secondary | ICD-10-CM | POA: Insufficient documentation

## 2014-06-11 DIAGNOSIS — D132 Benign neoplasm of duodenum: Secondary | ICD-10-CM | POA: Diagnosis not present

## 2014-06-11 DIAGNOSIS — K219 Gastro-esophageal reflux disease without esophagitis: Secondary | ICD-10-CM | POA: Insufficient documentation

## 2014-06-11 DIAGNOSIS — K317 Polyp of stomach and duodenum: Secondary | ICD-10-CM | POA: Insufficient documentation

## 2014-06-11 DIAGNOSIS — N189 Chronic kidney disease, unspecified: Secondary | ICD-10-CM | POA: Diagnosis not present

## 2014-06-11 DIAGNOSIS — K5730 Diverticulosis of large intestine without perforation or abscess without bleeding: Secondary | ICD-10-CM | POA: Diagnosis not present

## 2014-06-11 DIAGNOSIS — I129 Hypertensive chronic kidney disease with stage 1 through stage 4 chronic kidney disease, or unspecified chronic kidney disease: Secondary | ICD-10-CM | POA: Insufficient documentation

## 2014-06-11 HISTORY — DX: Gastro-esophageal reflux disease without esophagitis: K21.9

## 2014-06-11 HISTORY — PX: COLONOSCOPY WITH PROPOFOL: SHX5780

## 2014-06-11 HISTORY — PX: ESOPHAGOGASTRODUODENOSCOPY (EGD) WITH PROPOFOL: SHX5813

## 2014-06-11 SURGERY — COLONOSCOPY WITH PROPOFOL
Anesthesia: Monitor Anesthesia Care

## 2014-06-11 MED ORDER — PROPOFOL INFUSION 10 MG/ML OPTIME
INTRAVENOUS | Status: DC | PRN
  Administered 2014-06-11: 400 ug/kg/min via INTRAVENOUS

## 2014-06-11 MED ORDER — PROPOFOL 10 MG/ML IV BOLUS
INTRAVENOUS | Status: AC
  Filled 2014-06-11: qty 20

## 2014-06-11 MED ORDER — GLYCOPYRROLATE 0.2 MG/ML IJ SOLN
INTRAMUSCULAR | Status: AC
Start: 2014-06-11 — End: 2014-06-11
  Filled 2014-06-11: qty 1

## 2014-06-11 MED ORDER — GLYCOPYRROLATE 0.2 MG/ML IJ SOLN
INTRAMUSCULAR | Status: DC | PRN
  Administered 2014-06-11: 0.2 mg via INTRAVENOUS

## 2014-06-11 MED ORDER — LIDOCAINE HCL (CARDIAC) 20 MG/ML IV SOLN
INTRAVENOUS | Status: DC | PRN
  Administered 2014-06-11: 100 mg via INTRAVENOUS

## 2014-06-11 MED ORDER — LIDOCAINE HCL (CARDIAC) 20 MG/ML IV SOLN
INTRAVENOUS | Status: AC
Start: 2014-06-11 — End: 2014-06-11
  Filled 2014-06-11: qty 5

## 2014-06-11 MED ORDER — SODIUM CHLORIDE 0.9 % IV SOLN: INTRAVENOUS | Status: DC

## 2014-06-11 MED ORDER — ONDANSETRON HCL 4 MG/2ML IJ SOLN
INTRAMUSCULAR | Status: AC
  Filled 2014-06-11: qty 2

## 2014-06-11 MED ORDER — ONDANSETRON HCL 4 MG/2ML IJ SOLN
INTRAMUSCULAR | Status: DC | PRN
  Administered 2014-06-11: 4 mg via INTRAVENOUS

## 2014-06-11 MED ORDER — LACTATED RINGERS IV SOLN
INTRAVENOUS | Status: DC
  Administered 2014-06-11: 1000 mL via INTRAVENOUS

## 2014-06-11 SURGICAL SUPPLY — 25 items

## 2014-06-11 NOTE — Anesthesia Preprocedure Evaluation (Addendum)
Anesthesia Evaluation  Patient identified by MRN, date of birth, ID band Patient awake    Reviewed: Allergy & Precautions, H&P , NPO status , Patient's Chart, lab work & pertinent test results  Airway Mallampati: III  TM Distance: >3 FB Neck ROM: Full    Dental  (+) Teeth Intact, Dental Advisory Given   Pulmonary sleep apnea and Continuous Positive Airway Pressure Ventilation , former smoker,          Cardiovascular hypertension, Pt. on medications     Neuro/Psych negative neurological ROS  negative psych ROS   GI/Hepatic Neg liver ROS, GERD-  ,  Endo/Other  negative endocrine ROS  Renal/GU CRFRenal disease     Musculoskeletal  (+) Arthritis -,   Abdominal   Peds  Hematology negative hematology ROS (+)   Anesthesia Other Findings   Reproductive/Obstetrics                            Anesthesia Physical Anesthesia Plan  ASA: III  Anesthesia Plan: MAC   Post-op Pain Management:    Induction: Intravenous  Airway Management Planned: Simple Face Mask and Natural Airway  Additional Equipment:   Intra-op Plan:   Post-operative Plan:   Informed Consent: I have reviewed the patients History and Physical, chart, labs and discussed the procedure including the risks, benefits and alternatives for the proposed anesthesia with the patient or authorized representative who has indicated his/her understanding and acceptance.   Dental advisory given  Plan Discussed with: CRNA and Surgeon  Anesthesia Plan Comments:         Anesthesia Quick Evaluation

## 2014-06-11 NOTE — Op Note (Signed)
Procedures: Surveillance esophagogastroduodenoscopy and colonoscopy. Normal surveillance colonoscopy performed on 07/22/2009. History of adenomatous colon polyps removed colonoscopically. Duodenal adenomatous polyp removed endoscopically in 2012 at [facility]  Endoscopist: Earle Gell  Premedication: Propofol administered by anesthesia  Procedure: Surveillance colonoscopy with removal of a gastric antral polyp The patient was placed in the left lateral decubitus position. The Pentax gastroscope was passed through the posterior hypopharynx into the proximal esophagus without difficulty. I did not visualize the vocal cords.  Esophagoscopy: The proximal, mid, and lower segments of the esophageal mucosa appeared normal. The squamocolumnar junction was regular in appearance and noted at 40 cm from the incisor teeth.  Gastroscopy: Retroflex view of the gastric cardia and fundus was normal. The gastric body and pylorus appeared normal. From the distal gastric antrum a 6 mm sessile polyp was removed with the electrocautery snare.  Duodenoscopy: The duodenal bulb and descending duodenum appeared normal.  Assessment:  #1. No endoscopic signs of residual duodenal adenoma  #2. A 6 mm sessile gastric antral polyp was removed with the electrocautery snare  Procedure: Surveillance colonoscopy. Anal inspection and digital rectal exam were normal. The Pentax pediatric colonoscope was introduced into the rectum; the cecum was reached at 11:45 AM. A normal-appearing appendiceal orifice was identified. A normal-appearing ileocecal valve was identified. Colonic preparation for the exam today was good. We procedure ended at 11:54 AM. With withdrawal time was 9 minutes  Rectum. Normal. Retroflexed view of the distal rectum normal  Sigmoid colon and descending colon. Left colonic diverticulosis  Splenic flexure. Normal  Transverse colon. Normal  Hepatic flexure. Normal  Ascending  colon. Normal  Cecum and ileocecal valve. Normal  Assessment: Normal surveillance colonoscopy  Recommendation: Schedule surveillance colonoscopy in 5 years

## 2014-06-11 NOTE — Transfer of Care (Signed)
Immediate Anesthesia Transfer of Care Note  Patient: Robert Dickerson  Procedure(s) Performed: Procedure(s) (LRB): COLONOSCOPY WITH PROPOFOL (N/A) ESOPHAGOGASTRODUODENOSCOPY (EGD) WITH PROPOFOL (N/A)  Patient Location: PACU  Anesthesia Type: MAC  Level of Consciousness: sedated, patient cooperative and responds to stimulation  Airway & Oxygen Therapy: Patient Spontanous Breathing and Patient connected to face mask oxgen  Post-op Assessment: Report given to PACU RN and Post -op Vital signs reviewed and stable  Post vital signs: Reviewed and stable  Complications: No apparent anesthesia complications

## 2014-06-11 NOTE — Anesthesia Postprocedure Evaluation (Signed)
  Anesthesia Post-op Note  Patient: Robert Dickerson  Procedure(s) Performed: Procedure(s): COLONOSCOPY WITH PROPOFOL (N/A) ESOPHAGOGASTRODUODENOSCOPY (EGD) WITH PROPOFOL (N/A)  Patient Location: PACU  Anesthesia Type:MAC  Level of Consciousness: awake, alert  and oriented  Airway and Oxygen Therapy: Patient Spontanous Breathing  Post-op Pain: none  Post-op Assessment: Post-op Vital signs reviewed  Post-op Vital Signs: Reviewed  Last Vitals:  Filed Vitals:   06/11/14 1238  BP: 155/75  Pulse:   Temp:   Resp:     Complications: No apparent anesthesia complications

## 2014-06-11 NOTE — H&P (Signed)
  Procedures: Surveillance esophagogastroduodenoscopy and colonoscopy. Normal surveillance colonoscopy performed on 07/22/2009. History of adenomatous colon polyps removed colonoscopically. Duodenal adenomatous polyp removed in 2012 at [facility]  History: The patient is a 73 year old male born February 09, 1941. He is scheduled to undergo surveillance esophagogastroduodenoscopy and colonoscopy today.  Past medical history: Idiopathic chronic pancreatitis diagnosed by endoscopic ultrasound on 09/15/2011. Hypertension. History of adenomatous colon polyps removed colonoscopically. Duodenal adenomatous polyp removed endoscopically in 2012. Gastroesophageal reflux. Osteoarthritis of the hips and knees. Chronic back pain. Benign prostatic hypertrophy complicated by hematuria. Obstructive sleep apnea syndrome. Appendectomy. Arthroscopy of the knee for Baker's cyst. Herniated disc surgery. Cholecystectomy. Lumbar fusion surgery.  Allergies: Lisinopril. Oxycodone. ACE inhibitors.  Exam: The patient is alert and lying comfortably on the endoscopy stretcher. Abdomen is soft and nontender to palpation. Lungs are clear to auscultation. Cardiac exam reveals a regular rhythm.  Plan: Proceed with surveillance esophagogastroduodenoscopy and colonoscopy

## 2014-06-11 NOTE — Discharge Instructions (Signed)

## 2014-06-12 ENCOUNTER — Encounter (HOSPITAL_COMMUNITY): Payer: Self-pay | Admitting: Gastroenterology

## 2014-06-12 DIAGNOSIS — I5030 Unspecified diastolic (congestive) heart failure: Secondary | ICD-10-CM | POA: Diagnosis not present

## 2014-06-12 DIAGNOSIS — Z5181 Encounter for therapeutic drug level monitoring: Secondary | ICD-10-CM | POA: Diagnosis not present

## 2014-06-24 DIAGNOSIS — M654 Radial styloid tenosynovitis [de Quervain]: Secondary | ICD-10-CM | POA: Diagnosis not present

## 2014-06-24 DIAGNOSIS — M170 Bilateral primary osteoarthritis of knee: Secondary | ICD-10-CM | POA: Diagnosis not present

## 2014-06-25 DIAGNOSIS — H547 Unspecified visual loss: Secondary | ICD-10-CM | POA: Diagnosis not present

## 2014-06-25 DIAGNOSIS — H2513 Age-related nuclear cataract, bilateral: Secondary | ICD-10-CM | POA: Diagnosis not present

## 2014-07-01 DIAGNOSIS — M170 Bilateral primary osteoarthritis of knee: Secondary | ICD-10-CM | POA: Diagnosis not present

## 2014-07-08 DIAGNOSIS — M170 Bilateral primary osteoarthritis of knee: Secondary | ICD-10-CM | POA: Diagnosis not present

## 2014-08-15 DIAGNOSIS — M654 Radial styloid tenosynovitis [de Quervain]: Secondary | ICD-10-CM | POA: Diagnosis not present

## 2014-08-15 DIAGNOSIS — M170 Bilateral primary osteoarthritis of knee: Secondary | ICD-10-CM | POA: Diagnosis not present

## 2014-08-15 DIAGNOSIS — M7061 Trochanteric bursitis, right hip: Secondary | ICD-10-CM | POA: Diagnosis not present

## 2014-08-21 DIAGNOSIS — M720 Palmar fascial fibromatosis [Dupuytren]: Secondary | ICD-10-CM | POA: Diagnosis not present

## 2014-08-21 DIAGNOSIS — M654 Radial styloid tenosynovitis [de Quervain]: Secondary | ICD-10-CM | POA: Diagnosis not present

## 2014-09-19 DIAGNOSIS — D225 Melanocytic nevi of trunk: Secondary | ICD-10-CM | POA: Diagnosis not present

## 2014-09-19 DIAGNOSIS — J329 Chronic sinusitis, unspecified: Secondary | ICD-10-CM | POA: Diagnosis not present

## 2014-09-19 DIAGNOSIS — L821 Other seborrheic keratosis: Secondary | ICD-10-CM | POA: Diagnosis not present

## 2014-09-19 DIAGNOSIS — L718 Other rosacea: Secondary | ICD-10-CM | POA: Diagnosis not present

## 2014-09-19 DIAGNOSIS — L711 Rhinophyma: Secondary | ICD-10-CM | POA: Diagnosis not present

## 2014-11-07 DIAGNOSIS — L719 Rosacea, unspecified: Secondary | ICD-10-CM | POA: Diagnosis not present

## 2014-11-13 DIAGNOSIS — R0609 Other forms of dyspnea: Secondary | ICD-10-CM | POA: Diagnosis not present

## 2014-11-13 DIAGNOSIS — M549 Dorsalgia, unspecified: Secondary | ICD-10-CM | POA: Diagnosis not present

## 2014-11-13 DIAGNOSIS — R739 Hyperglycemia, unspecified: Secondary | ICD-10-CM | POA: Diagnosis not present

## 2014-11-13 DIAGNOSIS — G4733 Obstructive sleep apnea (adult) (pediatric): Secondary | ICD-10-CM | POA: Diagnosis not present

## 2014-11-13 DIAGNOSIS — I10 Essential (primary) hypertension: Secondary | ICD-10-CM | POA: Diagnosis not present

## 2014-11-13 DIAGNOSIS — N183 Chronic kidney disease, stage 3 (moderate): Secondary | ICD-10-CM | POA: Diagnosis not present

## 2014-11-13 DIAGNOSIS — R079 Chest pain, unspecified: Secondary | ICD-10-CM | POA: Diagnosis not present

## 2014-11-14 DIAGNOSIS — M549 Dorsalgia, unspecified: Secondary | ICD-10-CM | POA: Insufficient documentation

## 2014-11-18 DIAGNOSIS — R739 Hyperglycemia, unspecified: Secondary | ICD-10-CM | POA: Diagnosis not present

## 2014-11-22 ENCOUNTER — Ambulatory Visit (HOSPITAL_COMMUNITY): Payer: Medicare Other | Attending: Cardiology | Admitting: Radiology

## 2014-11-22 VITALS — BP 154/78 | HR 76 | Ht 68.0 in | Wt 236.0 lb

## 2014-11-22 DIAGNOSIS — R079 Chest pain, unspecified: Secondary | ICD-10-CM | POA: Insufficient documentation

## 2014-11-22 DIAGNOSIS — I10 Essential (primary) hypertension: Secondary | ICD-10-CM | POA: Diagnosis not present

## 2014-11-22 DIAGNOSIS — R0609 Other forms of dyspnea: Secondary | ICD-10-CM

## 2014-11-22 DIAGNOSIS — R5383 Other fatigue: Secondary | ICD-10-CM | POA: Diagnosis not present

## 2014-11-22 MED ORDER — TECHNETIUM TC 99M SESTAMIBI GENERIC - CARDIOLITE
11.0000 | Freq: Once | INTRAVENOUS | Status: AC | PRN
  Administered 2014-11-22: 11 via INTRAVENOUS

## 2014-11-22 MED ORDER — TECHNETIUM TC 99M SESTAMIBI GENERIC - CARDIOLITE
33.0000 | Freq: Once | INTRAVENOUS | Status: AC | PRN
  Administered 2014-11-22: 33 via INTRAVENOUS

## 2014-11-22 NOTE — Progress Notes (Signed)
[facility] [address] [place], [place] [address] [phone]    Cardiology Nuclear Med Study  Robert Dickerson is a 74 y.o. male     MRN : [id number]     DOB: Oct 21, 1940  Procedure Date: 11/22/2014  Nuclear Med Background Indication for Stress Test:  Evaluation for Ischemia History:  CHF:CKD Cardiac Risk Factors: Hypertension  Symptoms:  Chest Pain and Fatigue   Nuclear Pre-Procedure Caffeine/Decaff Intake:  None NPO After: 8:00pm   Lungs:  clear O2 Sat: 96% on room air. IV 0.9% NS with Angio Cath:  22g  IV Site: R Hand  IV Started by:  Ileene Hutchinson, EMT-P  Chest Size (in):  50 Cup Size: n/a  Height: 5\' 8"  (1.727 m)  Weight:  236 lb (107.049 kg)  BMI:  Body mass index is 35.89 kg/(m^2). Tech Comments:  n/a    Nuclear Med Study 1 or 2 day study: 1 day  Stress Test Type:  Stress  Reading MD: n/a  Order Authorizing Provider:  Celene Kras  Resting Radionuclide: Technetium 25m Sestamibi  Resting Radionuclide Dose: 11.0 mCi   Stress Radionuclide:  Technetium 58m Sestamibi  Stress Radionuclide Dose: 33.0 mCi           Stress Protocol Rest HR: 76 Stress HR: 148  Rest BP: 154/78 Stress BP: 171/73  Exercise Time (min): 4:30 METS: 4.6   Predicted Max HR: 148 bpm % Max HR: 100 bpm Rate Pressure Product: 25308   Dose of Adenosine (mg):  n/a Dose of Lexiscan: n/a mg  Dose of Atropine (mg): n/a Dose of Dobutamine: n/a mcg/kg/min (at max HR)  Stress Test Technologist: Glade Lloyd, BS-ES  Nuclear Technologist:  Earl Many, CNMT     Rest Procedure:  Myocardial perfusion imaging was performed at rest 45 minutes following the intravenous administration of Technetium 67m Sestamibi. Rest ECG: NSR - Normal EKG  Stress Procedure:  The patient exercised on the treadmill utilizing the Bruce Protocol for 4:30 minutes. The patient stopped due to SOB, fatigue and denied any chest pain.  Technetium 12m Sestamibi was injected at peak exercise and  myocardial perfusion imaging was performed after a brief delay. Stress ECG: No significant change from baseline ECG  QPS Raw Data Images:  Normal; no motion artifact; normal heart/lung ratio. Stress Images:  There is mild apical thinning with normal uptake in other regions. Rest Images:  There is mild apical thinning with normal uptake in other regions. Subtraction (SDS):  No evidence of ischemia. Transient Ischemic Dilatation (Normal <1.22):  0.89 Lung/Heart Ratio (Normal <0.45):  0.35  Quantitative Gated Spect Images QGS EDV:  108 ml QGS ESV:  46 ml  Impression Exercise Capacity:  Fair exercise capacity. BP Response:  Normal blood pressure response. Clinical Symptoms:  No chest pain. ECG Impression:  No significant ST segment change suggestive of ischemia. Comparison with Prior Nuclear Study: No previous nuclear study performed  Overall Impression:  Normal stress nuclear study. No EKG changes of ischemia. No chest pain.   LV Ejection Fraction: 57%.  LV Wall Motion:  NL LV Function; NL Wall Motion   Darlin Coco  MD

## 2014-11-25 DIAGNOSIS — M170 Bilateral primary osteoarthritis of knee: Secondary | ICD-10-CM | POA: Diagnosis not present

## 2014-11-25 NOTE — Addendum Note (Signed)
Addended by: Ines Bloomer on: 11/25/2014 08:33 AM   Modules accepted: Orders

## 2014-11-28 DIAGNOSIS — Z6828 Body mass index (BMI) 28.0-28.9, adult: Secondary | ICD-10-CM | POA: Diagnosis not present

## 2014-11-28 DIAGNOSIS — M549 Dorsalgia, unspecified: Secondary | ICD-10-CM | POA: Diagnosis not present

## 2014-11-28 DIAGNOSIS — I10 Essential (primary) hypertension: Secondary | ICD-10-CM | POA: Diagnosis not present

## 2014-12-03 DIAGNOSIS — M25551 Pain in right hip: Secondary | ICD-10-CM | POA: Diagnosis not present

## 2014-12-03 DIAGNOSIS — M25552 Pain in left hip: Secondary | ICD-10-CM | POA: Diagnosis not present

## 2014-12-17 DIAGNOSIS — M5416 Radiculopathy, lumbar region: Secondary | ICD-10-CM | POA: Diagnosis not present

## 2014-12-18 ENCOUNTER — Other Ambulatory Visit: Payer: Self-pay | Admitting: Orthopedic Surgery

## 2014-12-18 DIAGNOSIS — M545 Low back pain: Secondary | ICD-10-CM

## 2014-12-19 ENCOUNTER — Ambulatory Visit
Admission: RE | Admit: 2014-12-19 | Discharge: 2014-12-19 | Disposition: A | Discharge status: Home / Self Care | Payer: Medicare Other | Source: Ambulatory Visit | Attending: Orthopedic Surgery | Admitting: Orthopedic Surgery

## 2014-12-19 DIAGNOSIS — M4806 Spinal stenosis, lumbar region: Secondary | ICD-10-CM | POA: Diagnosis not present

## 2014-12-19 DIAGNOSIS — M545 Low back pain: Secondary | ICD-10-CM

## 2014-12-19 DIAGNOSIS — M4805 Spinal stenosis, thoracolumbar region: Secondary | ICD-10-CM | POA: Diagnosis not present

## 2014-12-23 DIAGNOSIS — M4806 Spinal stenosis, lumbar region: Secondary | ICD-10-CM | POA: Diagnosis not present

## 2014-12-25 DIAGNOSIS — H2513 Age-related nuclear cataract, bilateral: Secondary | ICD-10-CM | POA: Diagnosis not present

## 2014-12-27 DIAGNOSIS — M4806 Spinal stenosis, lumbar region: Secondary | ICD-10-CM | POA: Diagnosis not present

## 2015-01-02 DIAGNOSIS — M4806 Spinal stenosis, lumbar region: Secondary | ICD-10-CM | POA: Diagnosis not present

## 2015-01-20 DIAGNOSIS — M4806 Spinal stenosis, lumbar region: Secondary | ICD-10-CM | POA: Diagnosis not present

## 2015-01-27 ENCOUNTER — Other Ambulatory Visit: Payer: Self-pay

## 2015-02-19 DIAGNOSIS — M4806 Spinal stenosis, lumbar region: Secondary | ICD-10-CM | POA: Diagnosis not present

## 2015-03-14 DIAGNOSIS — M1610 Unilateral primary osteoarthritis, unspecified hip: Secondary | ICD-10-CM | POA: Diagnosis not present

## 2015-04-17 DIAGNOSIS — M25551 Pain in right hip: Secondary | ICD-10-CM | POA: Diagnosis not present

## 2015-05-09 DIAGNOSIS — M4806 Spinal stenosis, lumbar region: Secondary | ICD-10-CM | POA: Diagnosis not present

## 2015-05-15 DIAGNOSIS — M4806 Spinal stenosis, lumbar region: Secondary | ICD-10-CM | POA: Diagnosis not present

## 2015-05-15 DIAGNOSIS — M169 Osteoarthritis of hip, unspecified: Secondary | ICD-10-CM | POA: Diagnosis not present

## 2015-05-15 DIAGNOSIS — K219 Gastro-esophageal reflux disease without esophagitis: Secondary | ICD-10-CM | POA: Diagnosis not present

## 2015-05-15 DIAGNOSIS — Z79899 Other long term (current) drug therapy: Secondary | ICD-10-CM | POA: Diagnosis not present

## 2015-05-15 DIAGNOSIS — I129 Hypertensive chronic kidney disease with stage 1 through stage 4 chronic kidney disease, or unspecified chronic kidney disease: Secondary | ICD-10-CM | POA: Diagnosis not present

## 2015-05-15 DIAGNOSIS — Z5181 Encounter for therapeutic drug level monitoring: Secondary | ICD-10-CM | POA: Diagnosis not present

## 2015-05-15 DIAGNOSIS — E538 Deficiency of other specified B group vitamins: Secondary | ICD-10-CM | POA: Diagnosis not present

## 2015-05-15 DIAGNOSIS — R7301 Impaired fasting glucose: Secondary | ICD-10-CM | POA: Diagnosis not present

## 2015-05-15 DIAGNOSIS — I5030 Unspecified diastolic (congestive) heart failure: Secondary | ICD-10-CM | POA: Diagnosis not present

## 2015-05-15 DIAGNOSIS — G4733 Obstructive sleep apnea (adult) (pediatric): Secondary | ICD-10-CM | POA: Diagnosis not present

## 2015-05-15 DIAGNOSIS — N183 Chronic kidney disease, stage 3 (moderate): Secondary | ICD-10-CM | POA: Diagnosis not present

## 2015-05-15 DIAGNOSIS — M170 Bilateral primary osteoarthritis of knee: Secondary | ICD-10-CM | POA: Diagnosis not present

## 2015-05-15 DIAGNOSIS — Z23 Encounter for immunization: Secondary | ICD-10-CM | POA: Diagnosis not present

## 2015-05-15 DIAGNOSIS — Z1389 Encounter for screening for other disorder: Secondary | ICD-10-CM | POA: Diagnosis not present

## 2015-05-22 DIAGNOSIS — M170 Bilateral primary osteoarthritis of knee: Secondary | ICD-10-CM | POA: Diagnosis not present

## 2015-05-29 DIAGNOSIS — M170 Bilateral primary osteoarthritis of knee: Secondary | ICD-10-CM | POA: Diagnosis not present

## 2015-06-03 DIAGNOSIS — M5416 Radiculopathy, lumbar region: Secondary | ICD-10-CM | POA: Diagnosis not present

## 2015-06-14 DIAGNOSIS — J069 Acute upper respiratory infection, unspecified: Secondary | ICD-10-CM | POA: Diagnosis not present

## 2015-06-23 DIAGNOSIS — M25551 Pain in right hip: Secondary | ICD-10-CM | POA: Diagnosis not present

## 2015-07-03 DIAGNOSIS — M7061 Trochanteric bursitis, right hip: Secondary | ICD-10-CM | POA: Diagnosis not present

## 2015-07-03 DIAGNOSIS — M7062 Trochanteric bursitis, left hip: Secondary | ICD-10-CM | POA: Diagnosis not present

## 2015-07-07 DIAGNOSIS — M25552 Pain in left hip: Secondary | ICD-10-CM | POA: Diagnosis not present

## 2015-07-07 DIAGNOSIS — M25551 Pain in right hip: Secondary | ICD-10-CM | POA: Diagnosis not present

## 2015-07-08 DIAGNOSIS — M25551 Pain in right hip: Secondary | ICD-10-CM | POA: Diagnosis not present

## 2015-07-08 DIAGNOSIS — M25552 Pain in left hip: Secondary | ICD-10-CM | POA: Diagnosis not present

## 2015-07-11 DIAGNOSIS — J069 Acute upper respiratory infection, unspecified: Secondary | ICD-10-CM | POA: Diagnosis not present

## 2015-07-11 DIAGNOSIS — M25551 Pain in right hip: Secondary | ICD-10-CM | POA: Diagnosis not present

## 2015-07-11 DIAGNOSIS — M25552 Pain in left hip: Secondary | ICD-10-CM | POA: Diagnosis not present

## 2015-07-14 DIAGNOSIS — M25551 Pain in right hip: Secondary | ICD-10-CM | POA: Diagnosis not present

## 2015-07-14 DIAGNOSIS — M25552 Pain in left hip: Secondary | ICD-10-CM | POA: Diagnosis not present

## 2015-07-17 DIAGNOSIS — M25552 Pain in left hip: Secondary | ICD-10-CM | POA: Diagnosis not present

## 2015-07-17 DIAGNOSIS — M25551 Pain in right hip: Secondary | ICD-10-CM | POA: Diagnosis not present

## 2015-07-21 DIAGNOSIS — M25552 Pain in left hip: Secondary | ICD-10-CM | POA: Diagnosis not present

## 2015-07-21 DIAGNOSIS — M25551 Pain in right hip: Secondary | ICD-10-CM | POA: Diagnosis not present

## 2015-07-23 DIAGNOSIS — M25552 Pain in left hip: Secondary | ICD-10-CM | POA: Diagnosis not present

## 2015-07-23 DIAGNOSIS — M25551 Pain in right hip: Secondary | ICD-10-CM | POA: Diagnosis not present

## 2015-07-29 DIAGNOSIS — M25551 Pain in right hip: Secondary | ICD-10-CM | POA: Diagnosis not present

## 2015-07-29 DIAGNOSIS — M25552 Pain in left hip: Secondary | ICD-10-CM | POA: Diagnosis not present

## 2015-07-31 ENCOUNTER — Ambulatory Visit
Admission: RE | Admit: 2015-07-31 | Discharge: 2015-07-31 | Disposition: A | Discharge status: Home / Self Care | Payer: Medicare Other | Source: Ambulatory Visit | Attending: Internal Medicine | Admitting: Internal Medicine

## 2015-07-31 ENCOUNTER — Other Ambulatory Visit: Payer: Self-pay | Admitting: Internal Medicine

## 2015-07-31 DIAGNOSIS — M7062 Trochanteric bursitis, left hip: Secondary | ICD-10-CM | POA: Diagnosis not present

## 2015-07-31 DIAGNOSIS — R0602 Shortness of breath: Secondary | ICD-10-CM | POA: Diagnosis not present

## 2015-07-31 DIAGNOSIS — R059 Cough, unspecified: Secondary | ICD-10-CM

## 2015-07-31 DIAGNOSIS — R05 Cough: Secondary | ICD-10-CM

## 2015-07-31 DIAGNOSIS — M7061 Trochanteric bursitis, right hip: Secondary | ICD-10-CM | POA: Diagnosis not present

## 2015-08-06 DIAGNOSIS — M25551 Pain in right hip: Secondary | ICD-10-CM | POA: Diagnosis not present

## 2015-08-06 DIAGNOSIS — M25552 Pain in left hip: Secondary | ICD-10-CM | POA: Diagnosis not present

## 2015-08-07 DIAGNOSIS — M25551 Pain in right hip: Secondary | ICD-10-CM | POA: Diagnosis not present

## 2015-08-07 DIAGNOSIS — M25552 Pain in left hip: Secondary | ICD-10-CM | POA: Diagnosis not present

## 2015-08-12 DIAGNOSIS — M25551 Pain in right hip: Secondary | ICD-10-CM | POA: Diagnosis not present

## 2015-08-12 DIAGNOSIS — M25552 Pain in left hip: Secondary | ICD-10-CM | POA: Diagnosis not present

## 2015-08-15 DIAGNOSIS — M25552 Pain in left hip: Secondary | ICD-10-CM | POA: Diagnosis not present

## 2015-08-15 DIAGNOSIS — E538 Deficiency of other specified B group vitamins: Secondary | ICD-10-CM | POA: Diagnosis not present

## 2015-08-15 DIAGNOSIS — M25551 Pain in right hip: Secondary | ICD-10-CM | POA: Diagnosis not present

## 2015-08-18 DIAGNOSIS — M25552 Pain in left hip: Secondary | ICD-10-CM | POA: Diagnosis not present

## 2015-08-18 DIAGNOSIS — M25551 Pain in right hip: Secondary | ICD-10-CM | POA: Diagnosis not present

## 2015-08-21 DIAGNOSIS — L300 Nummular dermatitis: Secondary | ICD-10-CM | POA: Diagnosis not present

## 2015-08-21 DIAGNOSIS — R21 Rash and other nonspecific skin eruption: Secondary | ICD-10-CM | POA: Diagnosis not present

## 2015-09-04 DIAGNOSIS — M7062 Trochanteric bursitis, left hip: Secondary | ICD-10-CM | POA: Diagnosis not present

## 2015-09-04 DIAGNOSIS — M7061 Trochanteric bursitis, right hip: Secondary | ICD-10-CM | POA: Diagnosis not present

## 2015-09-24 ENCOUNTER — Ambulatory Visit (INDEPENDENT_AMBULATORY_CARE_PROVIDER_SITE_OTHER): Payer: Medicare Other | Admitting: Podiatry

## 2015-09-24 ENCOUNTER — Encounter: Payer: Self-pay | Admitting: Podiatry

## 2015-09-24 VITALS — BP 141/68 | HR 72 | Resp 12

## 2015-09-24 DIAGNOSIS — B351 Tinea unguium: Secondary | ICD-10-CM | POA: Diagnosis not present

## 2015-09-24 DIAGNOSIS — L603 Nail dystrophy: Secondary | ICD-10-CM | POA: Diagnosis not present

## 2015-09-24 DIAGNOSIS — M79675 Pain in left toe(s): Secondary | ICD-10-CM | POA: Diagnosis not present

## 2015-09-24 DIAGNOSIS — M79674 Pain in right toe(s): Secondary | ICD-10-CM

## 2015-09-24 NOTE — Patient Instructions (Signed)
Today your foot examination demonstrated decreased feeling in right and left feet. Check with your back Dr. about possible loss of feeling in your feet. Also, the toenails are yellow and brittle most likely fungal infection in the toenails. Today we obtained nail fragments of your toenails to submit to the lab for evaluation for possible fungal infection. The lab takes at least 30 days. We will contact you with the results and if positive will consider the use of oral medication

## 2015-09-24 NOTE — Progress Notes (Signed)
   Subjective:    Patient ID: Robert Dickerson, male    DOB: 1941-07-01, 75 y.o.   MRN: [id number]  HPI    This patient presents today describing a approximate five-year history of discolored, thickened toenails that are gradually becoming more uncomfortable and difficult to trim over time. He describes some occasional nighttime pain and occasionally with standing and walking. He sees a pedicures and ongoing basis at six-week intervals you have the nails trimmed.  Patient relates a history of back pain, hypertension and restless leg syndrome  Review of Systems  Skin: Positive for color change.       Objective:   Physical Exam  Orientated 3  Vascular: DP pulses 1/4 bilaterally PT pulses 2/4 bilaterally Capillary reflex immediate bilaterally  Neurological: Sensation to 10 g monofilament wire intact 1/5 right 2/5 left Vibratory sensation reactive bilaterally Ankle reflex equal reactive bilaterally  Dermatological: Dry skin bilaterally Toenails 1-5 right and 1, 4, 5 left are yellow, brittle, deformed, hypertrophic  Musculoskeletal: Hammertoe second right There is no restriction ankle, subtalar, midtarsal joints bilaterally      Assessment & Plan:   Assessment: Decreased sensation suggestive peripheral neuropathy that may be associated with possible back surgery Mycotic toenails 8  Plan: Discuss treatment options with patient including repetitive debridement, topical oral medication. Patient has interested in trying to resolve possible fungal infection nails 1-5 are debrided for nail fragments this submit for lab for PAS and fungal culture  Notify patient on receipt of lab

## 2015-10-17 ENCOUNTER — Telehealth: Payer: Self-pay | Admitting: *Deleted

## 2015-10-17 NOTE — Telephone Encounter (Signed)
Dr. Amalia Hailey reviewed 09/24/2015 fungal cultures as +, and ordered pt to make an appt.  Orders to pt and transferred to schedulers.

## 2015-10-28 ENCOUNTER — Ambulatory Visit (INDEPENDENT_AMBULATORY_CARE_PROVIDER_SITE_OTHER): Payer: Medicare Other | Admitting: Podiatry

## 2015-10-28 ENCOUNTER — Encounter: Payer: Self-pay | Admitting: Podiatry

## 2015-10-28 VITALS — BP 127/58 | HR 64 | Resp 12

## 2015-10-28 DIAGNOSIS — Z79899 Other long term (current) drug therapy: Secondary | ICD-10-CM

## 2015-10-28 DIAGNOSIS — B351 Tinea unguium: Secondary | ICD-10-CM

## 2015-10-28 LAB — HEPATIC FUNCTION PANEL
ALT: 30 U/L (ref 9–46)
AST: 26 U/L (ref 10–35)
Albumin: 3.4 g/dL — ABNORMAL LOW (ref 3.6–5.1)
Alkaline Phosphatase: 74 U/L (ref 40–115)
BILIRUBIN DIRECT: 0.1 mg/dL (ref <=0.2)
Indirect Bilirubin: 0.6 mg/dL (ref 0.2–1.2)
Total Bilirubin: 0.7 mg/dL (ref 0.2–1.2)
Total Protein: 6.6 g/dL (ref 6.1–8.1)

## 2015-10-28 MED ORDER — TERBINAFINE HCL 250 MG PO TABS: 250.0000 mg | ORAL_TABLET | Freq: Every day | ORAL | Status: DC

## 2015-10-28 NOTE — Progress Notes (Signed)
   Subjective:    Patient ID: Robert Dickerson, male    DOB: [birth year], 75 y.o.   MRN: [id number]  HPI As patient presents today for follow-up visit of 09/24/2015 to review the results of the PAS and fungal culture. On the initial visit nail fragments were obtained for submission of analysis   Review of Systems  Skin: Positive for color change.       Objective:   Physical Exam  Physical Exam  Orientated 3  Vascular: DP pulses 1/4 bilaterally PT pulses 2/4 bilaterally Capillary reflex immediate bilaterally  Neurological: Sensation to 10 g monofilament wire intact 1/5 right 2/5 left Vibratory sensation reactive bilaterally Ankle reflex equal reactive bilaterally  Dermatological: Dry skin bilaterally Toenails 1-5 right and 1, 4, 5 left are yellow, brittle, deformed, hypertrophic  Musculoskeletal: Hammertoe second right There is no restriction ankle, subtalar, midtarsal joints bilaterally  Results of [place] B1 [phone] Received date 10/01/2015 PAS reaction and GMS stain demonstrates probable dermatophytes Culture Trichophyton rubrum Saprophytic fungi Candida     Assessment & Plan:   Assessment: Onychomycoses with lab confirmation  Plan: Today I reviewed the results of the lab with patient today and discussed treatment options for fungal infections including no treatment, repetitive debridement, laser therapy, topical medication and oral medication. I made him aware that oral medication was the most predictable and reliable and recommended terbinafine Turner 50 mg #30 one by mouth daily 90 doses  Patient interested in using oral medication Referred patient to lab for baseline hepatic function and if within within normal limits telephone patient Rx terbinafine 250 mg #30 sig one by mouth daily 2 refills  Reappoint 30 days

## 2015-10-28 NOTE — Patient Instructions (Signed)
Today we reviewed the results of your culture which was positive for fungal infection and are beginning oral medication, terbinafine 250 mg 1 daily 90 doses Have your baseline liver, hepatic function test done in our office will call you for within normal limits to begin the medication We'll reevaluate your progress 4 weeks

## 2015-10-31 ENCOUNTER — Telehealth: Payer: Self-pay | Admitting: *Deleted

## 2015-10-31 NOTE — Telephone Encounter (Addendum)
-----   Message from Gean Birchwood, DPM sent at 10/29/2015  1:40 PM EDT ----- Hepatic function adequate to begin terbinafine 250 mg 1 daily 90 doses Contact patient having begin Lamisil.  10/31/2015-Informed pt of Dr. Stephenie Acres orders.

## 2015-11-04 ENCOUNTER — Encounter: Payer: Self-pay | Admitting: Podiatry

## 2015-11-13 DIAGNOSIS — R7301 Impaired fasting glucose: Secondary | ICD-10-CM | POA: Diagnosis not present

## 2015-11-13 DIAGNOSIS — K219 Gastro-esophageal reflux disease without esophagitis: Secondary | ICD-10-CM | POA: Diagnosis not present

## 2015-11-13 DIAGNOSIS — I5030 Unspecified diastolic (congestive) heart failure: Secondary | ICD-10-CM | POA: Diagnosis not present

## 2015-11-13 DIAGNOSIS — G4733 Obstructive sleep apnea (adult) (pediatric): Secondary | ICD-10-CM | POA: Diagnosis not present

## 2015-11-13 DIAGNOSIS — N183 Chronic kidney disease, stage 3 (moderate): Secondary | ICD-10-CM | POA: Diagnosis not present

## 2015-11-13 DIAGNOSIS — I129 Hypertensive chronic kidney disease with stage 1 through stage 4 chronic kidney disease, or unspecified chronic kidney disease: Secondary | ICD-10-CM | POA: Diagnosis not present

## 2015-11-20 ENCOUNTER — Telehealth: Payer: Self-pay | Admitting: *Deleted

## 2015-11-20 NOTE — Telephone Encounter (Signed)
Dr. Amalia Hailey states pt may begin the Lamisil.  Called [phone], left message informing pt to begin medication and to call with concerns.

## 2015-11-23 ENCOUNTER — Emergency Department (HOSPITAL_COMMUNITY)
Admission: EM | Admit: 2015-11-23 | Discharge: 2015-11-23 | Disposition: A | Discharge status: Home / Self Care | Payer: Medicare Other | Attending: Emergency Medicine | Admitting: Emergency Medicine

## 2015-11-23 ENCOUNTER — Encounter (HOSPITAL_COMMUNITY): Payer: Self-pay | Admitting: Emergency Medicine

## 2015-11-23 ENCOUNTER — Emergency Department (HOSPITAL_COMMUNITY): Payer: Medicare Other

## 2015-11-23 DIAGNOSIS — Z79899 Other long term (current) drug therapy: Secondary | ICD-10-CM | POA: Insufficient documentation

## 2015-11-23 DIAGNOSIS — Z87891 Personal history of nicotine dependence: Secondary | ICD-10-CM | POA: Diagnosis not present

## 2015-11-23 DIAGNOSIS — K219 Gastro-esophageal reflux disease without esophagitis: Secondary | ICD-10-CM | POA: Diagnosis not present

## 2015-11-23 DIAGNOSIS — R55 Syncope and collapse: Secondary | ICD-10-CM

## 2015-11-23 DIAGNOSIS — I501 Left ventricular failure: Secondary | ICD-10-CM | POA: Insufficient documentation

## 2015-11-23 DIAGNOSIS — M1990 Unspecified osteoarthritis, unspecified site: Secondary | ICD-10-CM | POA: Diagnosis not present

## 2015-11-23 DIAGNOSIS — R42 Dizziness and giddiness: Secondary | ICD-10-CM | POA: Insufficient documentation

## 2015-11-23 DIAGNOSIS — N183 Chronic kidney disease, stage 3 (moderate): Secondary | ICD-10-CM | POA: Insufficient documentation

## 2015-11-23 DIAGNOSIS — I130 Hypertensive heart and chronic kidney disease with heart failure and stage 1 through stage 4 chronic kidney disease, or unspecified chronic kidney disease: Secondary | ICD-10-CM | POA: Insufficient documentation

## 2015-11-23 DIAGNOSIS — I5030 Unspecified diastolic (congestive) heart failure: Secondary | ICD-10-CM | POA: Diagnosis not present

## 2015-11-23 DIAGNOSIS — R0602 Shortness of breath: Secondary | ICD-10-CM | POA: Diagnosis not present

## 2015-11-23 DIAGNOSIS — Z791 Long term (current) use of non-steroidal anti-inflammatories (NSAID): Secondary | ICD-10-CM | POA: Diagnosis not present

## 2015-11-23 HISTORY — DX: Unspecified diastolic (congestive) heart failure: I50.30

## 2015-11-23 HISTORY — DX: Hypertensive chronic kidney disease with stage 1 through stage 4 chronic kidney disease, or unspecified chronic kidney disease: I12.9

## 2015-11-23 HISTORY — DX: Chronic kidney disease, stage 3 (moderate): N18.3

## 2015-11-23 HISTORY — DX: Chronic kidney disease, stage 3 unspecified: N18.30

## 2015-11-23 LAB — BASIC METABOLIC PANEL
Anion gap: 8 (ref 5–15)
BUN: 32 mg/dL — AB (ref 6–20)
CHLORIDE: 105 mmol/L (ref 101–111)
CO2: 23 mmol/L (ref 22–32)
CREATININE: 2.08 mg/dL — AB (ref 0.61–1.24)
Calcium: 8.7 mg/dL — ABNORMAL LOW (ref 8.9–10.3)
GFR calc Af Amer: 35 mL/min — ABNORMAL LOW (ref >60)
GFR calc non Af Amer: 30 mL/min — ABNORMAL LOW (ref >60)
Glucose, Bld: 161 mg/dL — ABNORMAL HIGH (ref 65–99)
Potassium: 4.4 mmol/L (ref 3.5–5.1)
Sodium: 136 mmol/L (ref 135–145)

## 2015-11-23 LAB — CBC
HCT: 43.4 % (ref 39.0–52.0)
Hemoglobin: 14.7 g/dL (ref 13.0–17.0)
MCH: 28.2 pg (ref 26.0–34.0)
MCHC: 33.9 g/dL (ref 30.0–36.0)
MCV: 83.1 fL (ref 78.0–100.0)
PLATELETS: 307 10*3/uL (ref 150–400)
RBC: 5.22 MIL/uL (ref 4.22–5.81)
RDW: 14.7 % (ref 11.5–15.5)
WBC: 7.6 10*3/uL (ref 4.0–10.5)

## 2015-11-23 LAB — URINALYSIS, ROUTINE W REFLEX MICROSCOPIC
Bilirubin Urine: NEGATIVE
GLUCOSE, UA: NEGATIVE mg/dL
HGB URINE DIPSTICK: NEGATIVE
Ketones, ur: NEGATIVE mg/dL
Leukocytes, UA: NEGATIVE
Nitrite: NEGATIVE
Protein, ur: NEGATIVE mg/dL
Specific Gravity, Urine: 1.017 (ref 1.005–1.030)
pH: 6 (ref 5.0–8.0)

## 2015-11-23 LAB — I-STAT TROPONIN, ED
TROPONIN I, POC: 0.08 ng/mL (ref 0.00–0.08)
TROPONIN I, POC: 0.07 ng/mL (ref 0.00–0.08)

## 2015-11-23 LAB — BRAIN NATRIURETIC PEPTIDE: B Natriuretic Peptide: 77.2 pg/mL (ref 0.0–100.0)

## 2015-11-23 LAB — CBG MONITORING, ED: Glucose-Capillary: 162 mg/dL — ABNORMAL HIGH (ref 65–99)

## 2015-11-23 LAB — D-DIMER, QUANTITATIVE (NOT AT ARMC): D DIMER QUANT: 0.31 ug{FEU}/mL (ref 0.00–0.50)

## 2015-11-23 NOTE — Discharge Instructions (Signed)

## 2015-11-23 NOTE — ED Notes (Signed)
Patient was alert, oriented and stable upon discharge. RN went over AVS and patient had no further questions.  

## 2015-11-23 NOTE — ED Provider Notes (Signed)
CSN: [id number]     Arrival date & time 11/23/15  1658 History   First MD Initiated Contact with Patient 11/23/15 1817     Chief Complaint  Patient presents with  . Loss of Consciousness  . Dizziness     (Consider location/radiation/quality/duration/timing/severity/associated sxs/prior Treatment) HPI Patient presents after syncopal episode. Witnessed by wife and grandson. Patient states he was in his normal state of health when sitting in the recliner. He became lightheaded, nauseated and diaphoretic. Family states he was pale. He didn't slump forward and became unresponsive for roughly 5 minutes. No seizure-like activity. Patient did not fall or have any head or neck trauma. During this time family checked blood pressure and blood glucose. Systolic blood pressure was in the 130s and heart rate in the 80s. Normal blood sugar. Patient states he no longer has any lightheadedness. Denies any chest pain or pressure at any point. He has had progressive shortness of breath over the last few weeks. Worse with exertion. Denies fever or cough. He has had bilateral lower extremity swelling. No vomiting or diarrhea. Denies urinary hesitancy, dysuria or hematuria. Patient does admit to urinary frequency. Denies any current visual changes, speech changes focal weakness or numbness. Past Medical History  Diagnosis Date  . Hypertension   . Arthritis     all joints  . Sleep apnea     uses cpap, pt does not know settings  . GERD (gastroesophageal reflux disease)   . Hypertensive chronic kidney disease   . Chronic kidney disease, stage 3   . Congestive heart failure with LV diastolic dysfunction, NYHA class 2 ([place])    Past Surgical History  Procedure Laterality Date  . Appendectomy    . Cholecystectomy    . Knee arthroscopy Right YEARS AGO  . Esophagogastroduodenoscopy  08/26/2011    Procedure: ESOPHAGOGASTRODUODENOSCOPY (EGD);  Surgeon: Garlan Fair, MD;  Location: [facility];  Service: Endoscopy;   Laterality: N/A;  . Eus  09/15/2011    Procedure: UPPER ENDOSCOPIC ULTRASOUND (EUS) LINEAR;  Surgeon: Landry Dyke, MD;  Location: [facility];  Service: Endoscopy;  Laterality: N/A;  MAC  . Back surgery      4 surg, lower  . Colonoscopy with propofol N/A 06/11/2014    Procedure: COLONOSCOPY WITH PROPOFOL;  Surgeon: Garlan Fair, MD;  Location: [facility];  Service: Endoscopy;  Laterality: N/A;  . Esophagogastroduodenoscopy (egd) with propofol N/A 06/11/2014    Procedure: ESOPHAGOGASTRODUODENOSCOPY (EGD) WITH PROPOFOL;  Surgeon: Garlan Fair, MD;  Location: [facility];  Service: Endoscopy;  Laterality: N/A;   Family History  Problem Relation Age of Onset  . Anesthesia problems Neg Hx   . Hypotension Neg Hx   . Malignant hyperthermia Neg Hx   . Pseudochol deficiency Neg Hx    Social History  Substance Use Topics  . Smoking status: Former Smoker -- 2.00 packs/day for 20 years    Types: Cigarettes    Quit date: 08/02/1982  . Smokeless tobacco: Never Used  . Alcohol Use: No    Review of Systems  Constitutional: Negative for fever and chills.  HENT: Negative for congestion.   Eyes: Negative for visual disturbance.  Respiratory: Positive for shortness of breath. Negative for cough.   Cardiovascular: Positive for leg swelling. Negative for chest pain and palpitations.  Gastrointestinal: Negative for nausea, vomiting, abdominal pain, diarrhea and constipation.  Genitourinary: Positive for frequency. Negative for dysuria, hematuria, flank pain and difficulty urinating.  Musculoskeletal: Negative for back pain and neck pain.  Skin: Negative for rash and wound.  Neurological: Positive for dizziness, syncope and light-headedness. Negative for seizures, weakness, numbness and headaches.  All other systems reviewed and are negative.     Allergies  Ace inhibitors; Lisinopril; and Oxycodone  Home Medications   Prior to Admission medications   Medication Sig Start Date  End Date Taking? Authorizing Provider  amitriptyline (ELAVIL) 10 MG tablet Take 10 mg by mouth at bedtime.   Yes Historical Provider, MD  amLODipine (NORVASC) 10 MG tablet Take 10 mg by mouth daily.   Yes Historical Provider, MD  cetirizine (ZYRTEC) 10 MG tablet Take 5 mg by mouth at bedtime.    Yes Historical Provider, MD  econazole nitrate 1 % cream Apply 1 application topically daily as needed (rash).    Yes Historical Provider, MD  furosemide (LASIX) 40 MG tablet Take 40 mg by mouth daily.  05/29/14  Yes Historical Provider, MD  losartan (COZAAR) 100 MG tablet Take 100 mg by mouth daily.   Yes Historical Provider, MD  naproxen sodium (ALEVE) 220 MG tablet Take 220 mg by mouth. 04/09/11  Yes Historical Provider, MD  terbinafine (LAMISIL) 250 MG tablet Take 1 tablet (250 mg total) by mouth daily. 10/28/15  Yes Richard Joelene Millin, DPM  triamcinolone cream (KENALOG) 0.5 % Apply 1 application topically daily as needed (rash).   Yes Historical Provider, MD  vitamin B-12 (CYANOCOBALAMIN) 1000 MCG tablet Take 1,000 mcg by mouth daily.   Yes Historical Provider, MD   BP 138/62 mmHg  Pulse 78  Temp(Src) 97.8 F (36.6 C) (Oral)  Resp 22  SpO2 96% Physical Exam  Constitutional: He is oriented to person, place, and time. He appears well-developed and well-nourished. No distress.  HENT:  Head: Normocephalic and atraumatic.  Mouth/Throat: Oropharynx is clear and moist. No oropharyngeal exudate.  No intraoral trauma  Eyes: EOM are normal. Pupils are equal, round, and reactive to light.  No nystagmus  Neck: Normal range of motion. Neck supple.  Cardiovascular: Normal rate and regular rhythm.  Exam reveals no gallop and no friction rub.   No murmur heard. Pulmonary/Chest: Effort normal. No respiratory distress. He has no wheezes. He has rales.  Few scattered crackles in bilateral bases.  Abdominal: Soft. Bowel sounds are normal. He exhibits no distension and no mass. There is no tenderness. There is no  rebound and no guarding.  Musculoskeletal: Normal range of motion. He exhibits edema. He exhibits no tenderness.  2+ pretibial bilateral pitting edema. No calf asymmetry or tenderness.  Neurological: He is alert and oriented to person, place, and time.  Patient is alert and oriented x3 with clear, goal oriented speech. Patient has 5/5 motor in all extremities. Sensation is intact to light touch. Bilateral finger-to-nose is normal with no signs of dysmetria.   Skin: Skin is warm and dry. No rash noted. No erythema.  Psychiatric: He has a normal mood and affect. His behavior is normal.  Nursing note and vitals reviewed.   ED Course  Procedures (including critical care time) Labs Review Labs Reviewed  BASIC METABOLIC PANEL - Abnormal; Notable for the following:    Glucose, Bld 161 (*)    BUN 32 (*)    Creatinine, Ser 2.08 (*)    Calcium 8.7 (*)    GFR calc non Af Amer 30 (*)    GFR calc Af Amer 35 (*)    All other components within normal limits  CBG MONITORING, ED - Abnormal; Notable for the following:  Glucose-Capillary 162 (*)    All other components within normal limits  CBC  URINALYSIS, ROUTINE W REFLEX MICROSCOPIC (NOT AT [facility])  BRAIN NATRIURETIC PEPTIDE  D-DIMER, QUANTITATIVE (NOT AT [facility])  Randolm Idol, ED  Randolm Idol, ED    Imaging Review Dg Chest 2 View  11/23/2015  CLINICAL DATA:  Shortness of breath on exertion.  Initial encounter. EXAM: CHEST  2 VIEW COMPARISON:  PA and lateral chest 07/31/2015 and 08/14/2013. FINDINGS: The lungs are clear. Heart size is upper normal. No pneumothorax or pleural effusion. No focal bony abnormality. IMPRESSION: No acute disease. Electronically Signed   By: Inge Rise M.D.   On: 11/23/2015 19:17   I have personally reviewed and evaluated these images and lab results as part of my medical decision-making.   EKG Interpretation   Date/Time:  Sunday November 23 2015 17:08:00 EDT Ventricular Rate:  88 PR Interval:   224 QRS Duration: 90 QT Interval:  351 QTC Calculation: 425 R Axis:   25 Text Interpretation:  Sinus rhythm Atrial premature complexes Prolonged PR  interval Low voltage, extremity and precordial leads Baseline wander in  lead(s) V3 Morphologic changes in II since prior Confirmed by Lonia Skinner ([id number]) on 11/23/2015 5:13:05 PM Also confirmed by Lita Mains  MD,  Quanah Majka ([id number])  on 11/23/2015 6:30:11 PM      MDM   Final diagnoses:  Syncope, unspecified syncope type   Patient with syncopal episode while at home. Vital signs have remained normal in the emergency department. He's had no chest pain. He has ongoing dyspnea on exertion but no shortness of breath in the emergency department. EKG without any evidence of arrhythmia or acute ischemia. Troponin 2 is normal. D-dimer is negative. Patient has a normal BNP as well as chest x-ray. Laboratory exam with normal white blood cell count and hemoglobin. Patient has no evidence of urinary tract infection. Negative orthostatics. Patient does have a mild elevation in his creatinine. He has some baseline renal dysfunction and appears mildly worse than when compared to previous creatinine. Patient ambulated without any dyspnea and maintained saturations in the high 90s. He's been observed in the emergency department for 5 hours on telemetry monitoring. No arrhythmia. I have suggested that we bring the patient in for observation on telemetry monitor given unknown calls for the patient's syncope.. Patient does not want to be admitted and stated he will follow-up with his primary MD return for any worsening of symptoms. His wife is present.    Julianne Rice, MD 11/23/15 2203

## 2015-11-23 NOTE — ED Notes (Signed)
Bed: [id number] Expected date:  Expected time:  Means of arrival:  Comments: Pt getting dressed

## 2015-11-23 NOTE — ED Notes (Signed)
Pt states that he had dizziness today approx. 2 hours ago and broke out in a cold sweat and passed out in a sitting position. BP and CBG normal at home per wife. Alert and oriented at this time. States he still feels dizzy.

## 2015-11-25 ENCOUNTER — Ambulatory Visit (INDEPENDENT_AMBULATORY_CARE_PROVIDER_SITE_OTHER): Payer: Medicare Other | Admitting: Podiatry

## 2015-11-25 ENCOUNTER — Encounter: Payer: Self-pay | Admitting: Podiatry

## 2015-11-25 VITALS — BP 150/76 | HR 80 | Resp 12

## 2015-11-25 DIAGNOSIS — Z79899 Other long term (current) drug therapy: Secondary | ICD-10-CM

## 2015-11-25 NOTE — Patient Instructions (Signed)
Today we discussed your recent fainting episode which does not seem to be related to the terbinafine which you're taking. Also you mentioned that you has some tingling of feet for the past year and there is a history of elevated blood glucose on your recent lab. Please discuss this with your primary care physician. Continue taking terbinafine 250 mg once daily. After you have completed 15 doses out of the 30 doses into bottle #2 present to the lab for a repeat liver profile. Will notify you of the results of this lab and less there is a shoe with this lab report will continue on for a total of 90 doses of terbinafine and 50 mg.

## 2015-11-25 NOTE — Progress Notes (Signed)
   Subjective:    Patient ID: Robert Dickerson, male    DOB: 20-Oct-1940, 75 y.o.   MRN: [id number]  HPI This patient presents for follow up visit for tolerance of terbinafine 250 mg 1 daily prescribed the visit of 10/28/2015 patient denies any complaints from the medication including headaches, rashes or any any other bodily complaints that he is aware of. He said that the last several days he had a fainting episode and presented to the emergency department in the cause was not determined, however, will was not related to terbinafine which she is continuing on a daily basis   Review of Systems  Cardiovascular: Positive for leg swelling.  Musculoskeletal: Positive for joint swelling.  Skin: Positive for color change.       Objective:   Physical Exam Orientated 3  Vascular: DP pulses 1/4 bilaterally PT pulses 2/4 bilaterally  Neurological: Sensation to 10 g monofilament wire intact 1/5 right 2/5 left Ankle reflex equal and reactive bilaterally  Dermatological: Dry skin bilaterally The toenails 1-5 right and 1, 4, 5 left radial brittle deformed No open skin lesions bilaterally Lab reviewed 11/23/2015 includes urinalysis within normal limits CBC within normal limits Basic metabolic panel, blood glucose elevated at 161, elevated BUNs a 32, elevated creatinine at 2.08 calcium low at 8.7, GFR low at 30       Assessment & Plan:   Assessment: Fainting episode under evaluation does not appear to be related to terbinafine 250 mg 1 daily which patient is taking since the visit of 10/28/2015  Plan: Patient will continue on terbinafine 250 mg #30 one daily and repeat liver function panel after 45 doses. We'll contact patient after liver panel and less there is a contraindication patient will complete all 90 doses

## 2015-12-02 DIAGNOSIS — R55 Syncope and collapse: Secondary | ICD-10-CM | POA: Diagnosis not present

## 2015-12-09 ENCOUNTER — Ambulatory Visit (INDEPENDENT_AMBULATORY_CARE_PROVIDER_SITE_OTHER): Payer: Medicare Other

## 2015-12-09 DIAGNOSIS — R55 Syncope and collapse: Secondary | ICD-10-CM | POA: Diagnosis not present

## 2015-12-09 DIAGNOSIS — H2513 Age-related nuclear cataract, bilateral: Secondary | ICD-10-CM | POA: Diagnosis not present

## 2015-12-15 DIAGNOSIS — Z79899 Other long term (current) drug therapy: Secondary | ICD-10-CM | POA: Diagnosis not present

## 2015-12-15 LAB — HEPATIC FUNCTION PANEL
ALBUMIN: 3.5 g/dL — AB (ref 3.6–5.1)
ALT: 28 U/L (ref 9–46)
AST: 28 U/L (ref 10–35)
Alkaline Phosphatase: 84 U/L (ref 40–115)
BILIRUBIN TOTAL: 0.6 mg/dL (ref 0.2–1.2)
Bilirubin, Direct: 0.1 mg/dL (ref <=0.2)
Indirect Bilirubin: 0.5 mg/dL (ref 0.2–1.2)
Total Protein: 6.7 g/dL (ref 6.1–8.1)

## 2015-12-16 ENCOUNTER — Telehealth: Payer: Self-pay | Admitting: *Deleted

## 2015-12-16 NOTE — Telephone Encounter (Addendum)
-----   Message from Gean Birchwood, DPM sent at 12/16/2015  7:55 AM EDT ----- Hepatic function 12/15/2015 is adequate to continue on terbinafine 250 mg to complete a total of 90 doses Call patient and advise him to complete the 90 doses of the terbinafine.  Left message on pt's home phone that his blood work was within normal limits and he could begin the medication.

## 2016-01-28 DIAGNOSIS — L304 Erythema intertrigo: Secondary | ICD-10-CM | POA: Diagnosis not present

## 2016-01-28 DIAGNOSIS — L309 Dermatitis, unspecified: Secondary | ICD-10-CM | POA: Diagnosis not present

## 2016-01-28 DIAGNOSIS — L718 Other rosacea: Secondary | ICD-10-CM | POA: Diagnosis not present

## 2016-01-28 DIAGNOSIS — L821 Other seborrheic keratosis: Secondary | ICD-10-CM | POA: Diagnosis not present

## 2016-03-02 DIAGNOSIS — M170 Bilateral primary osteoarthritis of knee: Secondary | ICD-10-CM | POA: Diagnosis not present

## 2016-03-02 DIAGNOSIS — S40011A Contusion of right shoulder, initial encounter: Secondary | ICD-10-CM | POA: Diagnosis not present

## 2016-03-09 DIAGNOSIS — M170 Bilateral primary osteoarthritis of knee: Secondary | ICD-10-CM | POA: Diagnosis not present

## 2016-03-15 DIAGNOSIS — M170 Bilateral primary osteoarthritis of knee: Secondary | ICD-10-CM | POA: Diagnosis not present

## 2016-05-05 DIAGNOSIS — H9012 Conductive hearing loss, unilateral, left ear, with unrestricted hearing on the contralateral side: Secondary | ICD-10-CM | POA: Diagnosis not present

## 2016-05-05 DIAGNOSIS — M2669 Other specified disorders of temporomandibular joint: Secondary | ICD-10-CM | POA: Diagnosis not present

## 2016-05-05 DIAGNOSIS — H6122 Impacted cerumen, left ear: Secondary | ICD-10-CM | POA: Diagnosis not present

## 2016-05-05 DIAGNOSIS — H61892 Other specified disorders of left external ear: Secondary | ICD-10-CM | POA: Diagnosis not present

## 2016-05-12 DIAGNOSIS — H61892 Other specified disorders of left external ear: Secondary | ICD-10-CM | POA: Diagnosis not present

## 2016-05-12 DIAGNOSIS — H9012 Conductive hearing loss, unilateral, left ear, with unrestricted hearing on the contralateral side: Secondary | ICD-10-CM | POA: Diagnosis not present

## 2016-05-12 DIAGNOSIS — M2669 Other specified disorders of temporomandibular joint: Secondary | ICD-10-CM | POA: Diagnosis not present

## 2016-05-12 DIAGNOSIS — H6122 Impacted cerumen, left ear: Secondary | ICD-10-CM | POA: Diagnosis not present

## 2016-05-14 DIAGNOSIS — E538 Deficiency of other specified B group vitamins: Secondary | ICD-10-CM | POA: Diagnosis not present

## 2016-05-14 DIAGNOSIS — I129 Hypertensive chronic kidney disease with stage 1 through stage 4 chronic kidney disease, or unspecified chronic kidney disease: Secondary | ICD-10-CM | POA: Diagnosis not present

## 2016-05-14 DIAGNOSIS — Z6835 Body mass index (BMI) 35.0-35.9, adult: Secondary | ICD-10-CM | POA: Diagnosis not present

## 2016-05-14 DIAGNOSIS — Z0000 Encounter for general adult medical examination without abnormal findings: Secondary | ICD-10-CM | POA: Diagnosis not present

## 2016-05-14 DIAGNOSIS — N183 Chronic kidney disease, stage 3 (moderate): Secondary | ICD-10-CM | POA: Diagnosis not present

## 2016-05-14 DIAGNOSIS — R7301 Impaired fasting glucose: Secondary | ICD-10-CM | POA: Diagnosis not present

## 2016-05-14 DIAGNOSIS — G4733 Obstructive sleep apnea (adult) (pediatric): Secondary | ICD-10-CM | POA: Diagnosis not present

## 2016-05-14 DIAGNOSIS — Z1389 Encounter for screening for other disorder: Secondary | ICD-10-CM | POA: Diagnosis not present

## 2016-05-14 DIAGNOSIS — E669 Obesity, unspecified: Secondary | ICD-10-CM | POA: Diagnosis not present

## 2016-05-14 DIAGNOSIS — R21 Rash and other nonspecific skin eruption: Secondary | ICD-10-CM | POA: Diagnosis not present

## 2016-05-14 DIAGNOSIS — I5030 Unspecified diastolic (congestive) heart failure: Secondary | ICD-10-CM | POA: Diagnosis not present

## 2016-08-04 DIAGNOSIS — R5383 Other fatigue: Secondary | ICD-10-CM | POA: Diagnosis not present

## 2016-08-04 DIAGNOSIS — M2550 Pain in unspecified joint: Secondary | ICD-10-CM | POA: Diagnosis not present

## 2016-08-12 DIAGNOSIS — M25512 Pain in left shoulder: Secondary | ICD-10-CM | POA: Diagnosis not present

## 2016-08-12 DIAGNOSIS — M1711 Unilateral primary osteoarthritis, right knee: Secondary | ICD-10-CM | POA: Diagnosis not present

## 2016-08-27 DIAGNOSIS — M353 Polymyalgia rheumatica: Secondary | ICD-10-CM | POA: Diagnosis not present

## 2016-09-22 DIAGNOSIS — J0100 Acute maxillary sinusitis, unspecified: Secondary | ICD-10-CM | POA: Diagnosis not present

## 2016-11-18 DIAGNOSIS — S61411A Laceration without foreign body of right hand, initial encounter: Secondary | ICD-10-CM | POA: Diagnosis not present

## 2016-11-18 DIAGNOSIS — I10 Essential (primary) hypertension: Secondary | ICD-10-CM | POA: Diagnosis not present

## 2016-11-18 DIAGNOSIS — Z23 Encounter for immunization: Secondary | ICD-10-CM | POA: Diagnosis not present

## 2016-11-18 DIAGNOSIS — M353 Polymyalgia rheumatica: Secondary | ICD-10-CM | POA: Diagnosis not present

## 2016-11-18 DIAGNOSIS — Z87891 Personal history of nicotine dependence: Secondary | ICD-10-CM | POA: Diagnosis not present

## 2016-11-18 DIAGNOSIS — I5030 Unspecified diastolic (congestive) heart failure: Secondary | ICD-10-CM | POA: Diagnosis not present

## 2016-11-18 DIAGNOSIS — N183 Chronic kidney disease, stage 3 (moderate): Secondary | ICD-10-CM | POA: Diagnosis not present

## 2016-12-02 DIAGNOSIS — N183 Chronic kidney disease, stage 3 (moderate): Secondary | ICD-10-CM | POA: Diagnosis not present

## 2016-12-02 DIAGNOSIS — I5030 Unspecified diastolic (congestive) heart failure: Secondary | ICD-10-CM | POA: Diagnosis not present

## 2016-12-02 DIAGNOSIS — M170 Bilateral primary osteoarthritis of knee: Secondary | ICD-10-CM | POA: Diagnosis not present

## 2016-12-09 DIAGNOSIS — M170 Bilateral primary osteoarthritis of knee: Secondary | ICD-10-CM | POA: Diagnosis not present

## 2016-12-16 DIAGNOSIS — M170 Bilateral primary osteoarthritis of knee: Secondary | ICD-10-CM | POA: Diagnosis not present

## 2017-02-11 DIAGNOSIS — I5030 Unspecified diastolic (congestive) heart failure: Secondary | ICD-10-CM | POA: Diagnosis not present

## 2017-02-11 DIAGNOSIS — Z125 Encounter for screening for malignant neoplasm of prostate: Secondary | ICD-10-CM | POA: Diagnosis not present

## 2017-02-11 DIAGNOSIS — M353 Polymyalgia rheumatica: Secondary | ICD-10-CM | POA: Diagnosis not present

## 2017-03-10 DIAGNOSIS — H6122 Impacted cerumen, left ear: Secondary | ICD-10-CM | POA: Insufficient documentation

## 2017-03-15 DIAGNOSIS — H2513 Age-related nuclear cataract, bilateral: Secondary | ICD-10-CM | POA: Diagnosis not present

## 2017-03-25 ENCOUNTER — Ambulatory Visit (INDEPENDENT_AMBULATORY_CARE_PROVIDER_SITE_OTHER): Payer: Medicare Other | Admitting: Podiatry

## 2017-03-25 ENCOUNTER — Encounter: Payer: Self-pay | Admitting: Podiatry

## 2017-03-25 DIAGNOSIS — M79675 Pain in left toe(s): Secondary | ICD-10-CM | POA: Diagnosis not present

## 2017-03-25 DIAGNOSIS — B351 Tinea unguium: Secondary | ICD-10-CM | POA: Diagnosis not present

## 2017-03-25 DIAGNOSIS — M79674 Pain in right toe(s): Secondary | ICD-10-CM | POA: Diagnosis not present

## 2017-03-25 NOTE — Progress Notes (Signed)
Complaint:  Visit Type: Patient returns to my office for continued preventative foot care services. Complaint: Patient states" my nails have grown long and thick and become painful to walk and wear shoes"  The patient presents for preventative foot care services. No changes to ROS  Podiatric Exam: Vascular: dorsalis pedis and posterior tibial pulses are palpable bilateral. Capillary return is immediate. Temperature gradient is WNL. Skin turgor WNL  Sensorium: Absent LOPS  B/L Nail Exam: Pt has thick disfigured discolored nails with subungual debris noted bilateral entire nail hallux  And second toes  B/L. Ulcer Exam: There is no evidence of ulcer or pre-ulcerative changes or infection. Orthopedic Exam: Muscle tone and strength are WNL. No limitations in general ROM. No crepitus or effusions noted. Foot type and digits show no abnormalities. Bony prominences are unremarkable. Skin: No Porokeratosis. No infection or ulcers  Diagnosis:  Onychomycosis, , Pain in right toe, pain in left toes  Treatment & Plan Procedures and Treatment: Consent by patient was obtained for treatment procedures. The patient understood the discussion of treatment and procedures well. All questions were answered thoroughly reviewed. Debridement of mycotic and hypertrophic toenails, 1 through 5 bilateral and clearing of subungual debris. No ulceration, no infection noted.  Return Visit-Office Procedure: Patient instructed to return to the office for a follow up visit 3 months for continued evaluation and treatment.    Gardiner Barefoot DPM

## 2017-03-29 DIAGNOSIS — H2513 Age-related nuclear cataract, bilateral: Secondary | ICD-10-CM | POA: Diagnosis not present

## 2017-04-27 DIAGNOSIS — I129 Hypertensive chronic kidney disease with stage 1 through stage 4 chronic kidney disease, or unspecified chronic kidney disease: Secondary | ICD-10-CM | POA: Diagnosis not present

## 2017-04-27 DIAGNOSIS — M179 Osteoarthritis of knee, unspecified: Secondary | ICD-10-CM | POA: Diagnosis not present

## 2017-04-27 DIAGNOSIS — N183 Chronic kidney disease, stage 3 (moderate): Secondary | ICD-10-CM | POA: Diagnosis not present

## 2017-04-27 DIAGNOSIS — N400 Enlarged prostate without lower urinary tract symptoms: Secondary | ICD-10-CM | POA: Diagnosis not present

## 2017-04-27 DIAGNOSIS — M169 Osteoarthritis of hip, unspecified: Secondary | ICD-10-CM | POA: Diagnosis not present

## 2017-04-27 DIAGNOSIS — I5030 Unspecified diastolic (congestive) heart failure: Secondary | ICD-10-CM | POA: Diagnosis not present

## 2017-05-03 DIAGNOSIS — Z23 Encounter for immunization: Secondary | ICD-10-CM | POA: Diagnosis not present

## 2017-05-03 DIAGNOSIS — M179 Osteoarthritis of knee, unspecified: Secondary | ICD-10-CM | POA: Diagnosis not present

## 2017-05-03 DIAGNOSIS — R197 Diarrhea, unspecified: Secondary | ICD-10-CM | POA: Diagnosis not present

## 2017-05-03 DIAGNOSIS — I5030 Unspecified diastolic (congestive) heart failure: Secondary | ICD-10-CM | POA: Diagnosis not present

## 2017-05-03 DIAGNOSIS — M353 Polymyalgia rheumatica: Secondary | ICD-10-CM | POA: Diagnosis not present

## 2017-05-03 DIAGNOSIS — N183 Chronic kidney disease, stage 3 (moderate): Secondary | ICD-10-CM | POA: Diagnosis not present

## 2017-05-03 DIAGNOSIS — I129 Hypertensive chronic kidney disease with stage 1 through stage 4 chronic kidney disease, or unspecified chronic kidney disease: Secondary | ICD-10-CM | POA: Diagnosis not present

## 2017-05-03 DIAGNOSIS — K219 Gastro-esophageal reflux disease without esophagitis: Secondary | ICD-10-CM | POA: Diagnosis not present

## 2017-05-03 DIAGNOSIS — G2581 Restless legs syndrome: Secondary | ICD-10-CM | POA: Diagnosis not present

## 2017-05-27 DIAGNOSIS — N183 Chronic kidney disease, stage 3 (moderate): Secondary | ICD-10-CM | POA: Diagnosis not present

## 2017-05-27 DIAGNOSIS — M169 Osteoarthritis of hip, unspecified: Secondary | ICD-10-CM | POA: Diagnosis not present

## 2017-05-27 DIAGNOSIS — N400 Enlarged prostate without lower urinary tract symptoms: Secondary | ICD-10-CM | POA: Diagnosis not present

## 2017-05-27 DIAGNOSIS — I5030 Unspecified diastolic (congestive) heart failure: Secondary | ICD-10-CM | POA: Diagnosis not present

## 2017-05-27 DIAGNOSIS — I129 Hypertensive chronic kidney disease with stage 1 through stage 4 chronic kidney disease, or unspecified chronic kidney disease: Secondary | ICD-10-CM | POA: Diagnosis not present

## 2017-05-27 DIAGNOSIS — M179 Osteoarthritis of knee, unspecified: Secondary | ICD-10-CM | POA: Diagnosis not present

## 2017-05-31 DIAGNOSIS — I129 Hypertensive chronic kidney disease with stage 1 through stage 4 chronic kidney disease, or unspecified chronic kidney disease: Secondary | ICD-10-CM | POA: Diagnosis not present

## 2017-05-31 DIAGNOSIS — I5030 Unspecified diastolic (congestive) heart failure: Secondary | ICD-10-CM | POA: Diagnosis not present

## 2017-05-31 DIAGNOSIS — K591 Functional diarrhea: Secondary | ICD-10-CM | POA: Diagnosis not present

## 2017-06-09 DIAGNOSIS — I129 Hypertensive chronic kidney disease with stage 1 through stage 4 chronic kidney disease, or unspecified chronic kidney disease: Secondary | ICD-10-CM | POA: Diagnosis not present

## 2017-06-09 DIAGNOSIS — M179 Osteoarthritis of knee, unspecified: Secondary | ICD-10-CM | POA: Diagnosis not present

## 2017-06-09 DIAGNOSIS — N183 Chronic kidney disease, stage 3 (moderate): Secondary | ICD-10-CM | POA: Diagnosis not present

## 2017-06-09 DIAGNOSIS — M169 Osteoarthritis of hip, unspecified: Secondary | ICD-10-CM | POA: Diagnosis not present

## 2017-06-09 DIAGNOSIS — I5030 Unspecified diastolic (congestive) heart failure: Secondary | ICD-10-CM | POA: Diagnosis not present

## 2017-06-09 DIAGNOSIS — N400 Enlarged prostate without lower urinary tract symptoms: Secondary | ICD-10-CM | POA: Diagnosis not present

## 2017-06-09 DIAGNOSIS — I10 Essential (primary) hypertension: Secondary | ICD-10-CM | POA: Diagnosis not present

## 2017-06-13 DIAGNOSIS — H2512 Age-related nuclear cataract, left eye: Secondary | ICD-10-CM | POA: Diagnosis not present

## 2017-06-13 DIAGNOSIS — Z9989 Dependence on other enabling machines and devices: Secondary | ICD-10-CM | POA: Diagnosis not present

## 2017-06-13 DIAGNOSIS — K219 Gastro-esophageal reflux disease without esophagitis: Secondary | ICD-10-CM | POA: Diagnosis not present

## 2017-06-13 DIAGNOSIS — I10 Essential (primary) hypertension: Secondary | ICD-10-CM | POA: Diagnosis not present

## 2017-06-13 DIAGNOSIS — G4730 Sleep apnea, unspecified: Secondary | ICD-10-CM | POA: Diagnosis not present

## 2017-06-13 DIAGNOSIS — E669 Obesity, unspecified: Secondary | ICD-10-CM | POA: Diagnosis not present

## 2017-06-13 DIAGNOSIS — Z885 Allergy status to narcotic agent status: Secondary | ICD-10-CM | POA: Diagnosis not present

## 2017-06-20 DIAGNOSIS — H2512 Age-related nuclear cataract, left eye: Secondary | ICD-10-CM | POA: Diagnosis not present

## 2017-06-22 ENCOUNTER — Ambulatory Visit (INDEPENDENT_AMBULATORY_CARE_PROVIDER_SITE_OTHER): Payer: Medicare Other | Admitting: Podiatry

## 2017-06-22 ENCOUNTER — Encounter: Payer: Self-pay | Admitting: Podiatry

## 2017-06-22 DIAGNOSIS — M79674 Pain in right toe(s): Secondary | ICD-10-CM

## 2017-06-22 DIAGNOSIS — B351 Tinea unguium: Secondary | ICD-10-CM

## 2017-06-22 DIAGNOSIS — M79675 Pain in left toe(s): Secondary | ICD-10-CM | POA: Diagnosis not present

## 2017-06-22 NOTE — Progress Notes (Signed)
Complaint:  Visit Type: Patient returns to my office for continued preventative foot care services. Complaint: Patient states" my nails have grown long and thick and become painful to walk and wear shoes"  The patient presents for preventative foot care services. No changes to ROS  Podiatric Exam: Vascular: dorsalis pedis and posterior tibial pulses are palpable bilateral. Capillary return is immediate. Temperature gradient is WNL. Skin turgor WNL  Sensorium: Absent LOPS  B/L Nail Exam: Pt has thick disfigured discolored nails with subungual debris noted bilateral entire nail hallux  And second toes  B/L. Ulcer Exam: There is no evidence of ulcer or pre-ulcerative changes or infection. Orthopedic Exam: Muscle tone and strength are WNL. No limitations in general ROM. No crepitus or effusions noted. Foot type and digits show no abnormalities. Bony prominences are unremarkable. Skin: No Porokeratosis. No infection or ulcers  Diagnosis:  Onychomycosis, , Pain in right toe, pain in left toes  Treatment & Plan Procedures and Treatment: Consent by patient was obtained for treatment procedures. The patient understood the discussion of treatment and procedures well. All questions were answered thoroughly reviewed. Debridement of mycotic and hypertrophic toenails, 1 through 5 bilateral and clearing of subungual debris. No ulceration, no infection noted.  Return Visit-Office Procedure: Patient instructed to return to the office for a follow up visit 3 months for continued evaluation and treatment.    Gardiner Barefoot DPM

## 2017-06-27 DIAGNOSIS — I10 Essential (primary) hypertension: Secondary | ICD-10-CM | POA: Diagnosis not present

## 2017-06-27 DIAGNOSIS — Z885 Allergy status to narcotic agent status: Secondary | ICD-10-CM | POA: Diagnosis not present

## 2017-06-27 DIAGNOSIS — G4730 Sleep apnea, unspecified: Secondary | ICD-10-CM | POA: Diagnosis not present

## 2017-06-27 DIAGNOSIS — H2511 Age-related nuclear cataract, right eye: Secondary | ICD-10-CM | POA: Diagnosis not present

## 2017-06-27 DIAGNOSIS — Z9989 Dependence on other enabling machines and devices: Secondary | ICD-10-CM | POA: Diagnosis not present

## 2017-06-27 DIAGNOSIS — E669 Obesity, unspecified: Secondary | ICD-10-CM | POA: Diagnosis not present

## 2017-06-27 DIAGNOSIS — K219 Gastro-esophageal reflux disease without esophagitis: Secondary | ICD-10-CM | POA: Diagnosis not present

## 2017-07-04 DIAGNOSIS — I5030 Unspecified diastolic (congestive) heart failure: Secondary | ICD-10-CM | POA: Diagnosis not present

## 2017-07-04 DIAGNOSIS — E669 Obesity, unspecified: Secondary | ICD-10-CM | POA: Diagnosis not present

## 2017-07-04 DIAGNOSIS — I129 Hypertensive chronic kidney disease with stage 1 through stage 4 chronic kidney disease, or unspecified chronic kidney disease: Secondary | ICD-10-CM | POA: Diagnosis not present

## 2017-07-04 DIAGNOSIS — M353 Polymyalgia rheumatica: Secondary | ICD-10-CM | POA: Diagnosis not present

## 2017-07-04 DIAGNOSIS — G4733 Obstructive sleep apnea (adult) (pediatric): Secondary | ICD-10-CM | POA: Diagnosis not present

## 2017-07-04 DIAGNOSIS — Z1389 Encounter for screening for other disorder: Secondary | ICD-10-CM | POA: Diagnosis not present

## 2017-07-04 DIAGNOSIS — R7301 Impaired fasting glucose: Secondary | ICD-10-CM | POA: Diagnosis not present

## 2017-07-04 DIAGNOSIS — Z0000 Encounter for general adult medical examination without abnormal findings: Secondary | ICD-10-CM | POA: Diagnosis not present

## 2017-07-04 DIAGNOSIS — Z6835 Body mass index (BMI) 35.0-35.9, adult: Secondary | ICD-10-CM | POA: Diagnosis not present

## 2017-07-04 DIAGNOSIS — N183 Chronic kidney disease, stage 3 (moderate): Secondary | ICD-10-CM | POA: Diagnosis not present

## 2017-07-05 DIAGNOSIS — L711 Rhinophyma: Secondary | ICD-10-CM | POA: Diagnosis not present

## 2017-08-18 DIAGNOSIS — M25552 Pain in left hip: Secondary | ICD-10-CM | POA: Diagnosis not present

## 2017-08-18 DIAGNOSIS — M170 Bilateral primary osteoarthritis of knee: Secondary | ICD-10-CM | POA: Diagnosis not present

## 2017-08-25 DIAGNOSIS — M170 Bilateral primary osteoarthritis of knee: Secondary | ICD-10-CM | POA: Diagnosis not present

## 2017-09-01 DIAGNOSIS — M170 Bilateral primary osteoarthritis of knee: Secondary | ICD-10-CM | POA: Diagnosis not present

## 2017-09-21 ENCOUNTER — Ambulatory Visit: Payer: Medicare Other | Admitting: Podiatry

## 2017-10-04 ENCOUNTER — Encounter: Payer: Self-pay | Admitting: Podiatry

## 2017-10-04 ENCOUNTER — Ambulatory Visit (INDEPENDENT_AMBULATORY_CARE_PROVIDER_SITE_OTHER): Payer: Medicare Other | Admitting: Podiatry

## 2017-10-04 DIAGNOSIS — L711 Rhinophyma: Secondary | ICD-10-CM | POA: Diagnosis not present

## 2017-10-04 DIAGNOSIS — B351 Tinea unguium: Secondary | ICD-10-CM

## 2017-10-04 DIAGNOSIS — M79675 Pain in left toe(s): Secondary | ICD-10-CM

## 2017-10-04 DIAGNOSIS — M79674 Pain in right toe(s): Secondary | ICD-10-CM

## 2017-10-04 DIAGNOSIS — L570 Actinic keratosis: Secondary | ICD-10-CM | POA: Diagnosis not present

## 2017-10-04 NOTE — Progress Notes (Addendum)
Complaint:  Visit Type: Patient returns to my office for continued preventative foot care services. Complaint: Patient states" my nails have grown long and thick and become painful to walk and wear shoes"  The patient presents for preventative foot care services. No changes to ROS  Podiatric Exam: Vascular: dorsalis pedis and posterior tibial pulses are palpable bilateral. Capillary return is immediate. Temperature gradient is WNL. Skin turgor WNL  Sensorium: Absent LOPS  B/L Nail Exam: Pt has thick disfigured discolored nails with subungual debris noted bilateral entire nail hallux  and second toes  B/L. Ulcer Exam: There is no evidence of ulcer or pre-ulcerative changes or infection. Orthopedic Exam: Muscle tone and strength are WNL. No limitations in general ROM. No crepitus or effusions noted. Foot type and digits show no abnormalities. Bony prominences are unremarkable. Mild arthritis pain midfoot right. Skin: No Porokeratosis. No infection or ulcers  Diagnosis:  Onychomycosis, , Pain in right toe, pain in left toes  Treatment & Plan Procedures and Treatment: Consent by patient was obtained for treatment procedures. The patient understood the discussion of treatment and procedures well. All questions were answered thoroughly reviewed. Debridement of mycotic and hypertrophic toenails, 1 through 5 bilateral and clearing of subungual debris. No ulceration, no infection noted. ABN signed for 2019. Return Visit-Office Procedure: Patient instructed to return to the office for a follow up visit 3 months for continued evaluation and treatment.    Levorn Oleski DPM 

## 2018-01-03 DIAGNOSIS — N183 Chronic kidney disease, stage 3 (moderate): Secondary | ICD-10-CM | POA: Diagnosis not present

## 2018-01-03 DIAGNOSIS — I5030 Unspecified diastolic (congestive) heart failure: Secondary | ICD-10-CM | POA: Diagnosis not present

## 2018-01-03 DIAGNOSIS — M353 Polymyalgia rheumatica: Secondary | ICD-10-CM | POA: Diagnosis not present

## 2018-01-03 DIAGNOSIS — R0609 Other forms of dyspnea: Secondary | ICD-10-CM | POA: Diagnosis not present

## 2018-01-03 DIAGNOSIS — I129 Hypertensive chronic kidney disease with stage 1 through stage 4 chronic kidney disease, or unspecified chronic kidney disease: Secondary | ICD-10-CM | POA: Diagnosis not present

## 2018-01-03 DIAGNOSIS — G4733 Obstructive sleep apnea (adult) (pediatric): Secondary | ICD-10-CM | POA: Diagnosis not present

## 2018-01-04 ENCOUNTER — Ambulatory Visit (INDEPENDENT_AMBULATORY_CARE_PROVIDER_SITE_OTHER): Payer: Medicare Other | Admitting: Podiatry

## 2018-01-04 ENCOUNTER — Encounter: Payer: Self-pay | Admitting: Podiatry

## 2018-01-04 DIAGNOSIS — M79675 Pain in left toe(s): Secondary | ICD-10-CM | POA: Diagnosis not present

## 2018-01-04 DIAGNOSIS — B351 Tinea unguium: Secondary | ICD-10-CM

## 2018-01-04 DIAGNOSIS — M79674 Pain in right toe(s): Secondary | ICD-10-CM

## 2018-01-04 DIAGNOSIS — L711 Rhinophyma: Secondary | ICD-10-CM | POA: Diagnosis not present

## 2018-01-04 NOTE — Progress Notes (Signed)
Complaint:  Visit Type: Patient returns to my office for continued preventative foot care services. Complaint: Patient states" my nails have grown long and thick and become painful to walk and wear shoes"  The patient presents for preventative foot care services. No changes to ROS  Podiatric Exam: Vascular: dorsalis pedis and posterior tibial pulses are palpable bilateral. Capillary return is immediate. Temperature gradient is WNL. Skin turgor WNL  Sensorium: Absent LOPS  B/L Nail Exam: Pt has thick disfigured discolored nails with subungual debris noted bilateral entire nail hallux  and second toes  B/L. Ulcer Exam: There is no evidence of ulcer or pre-ulcerative changes or infection. Orthopedic Exam: Muscle tone and strength are WNL. No limitations in general ROM. No crepitus or effusions noted. Foot type and digits show no abnormalities. Bony prominences are unremarkable. Mild arthritis pain midfoot right. Skin: No Porokeratosis. No infection or ulcers  Diagnosis:  Onychomycosis, , Pain in right toe, pain in left toes  Treatment & Plan Procedures and Treatment: Consent by patient was obtained for treatment procedures. The patient understood the discussion of treatment and procedures well. All questions were answered thoroughly reviewed. Debridement of mycotic and hypertrophic toenails, 1 through 5 bilateral and clearing of subungual debris. No ulceration, no infection noted. ABN signed for 2019. Return Visit-Office Procedure: Patient instructed to return to the office for a follow up visit 3 months for continued evaluation and treatment.    Ajla Mcgeachy DPM 

## 2018-01-12 DIAGNOSIS — Z79899 Other long term (current) drug therapy: Secondary | ICD-10-CM | POA: Diagnosis not present

## 2018-01-12 DIAGNOSIS — I5030 Unspecified diastolic (congestive) heart failure: Secondary | ICD-10-CM | POA: Diagnosis not present

## 2018-02-06 DIAGNOSIS — Z79899 Other long term (current) drug therapy: Secondary | ICD-10-CM | POA: Diagnosis not present

## 2018-02-08 DIAGNOSIS — R0609 Other forms of dyspnea: Secondary | ICD-10-CM | POA: Diagnosis not present

## 2018-03-29 ENCOUNTER — Ambulatory Visit (INDEPENDENT_AMBULATORY_CARE_PROVIDER_SITE_OTHER): Payer: Medicare Other | Admitting: Podiatry

## 2018-03-29 ENCOUNTER — Encounter: Payer: Self-pay | Admitting: Podiatry

## 2018-03-29 DIAGNOSIS — M79675 Pain in left toe(s): Secondary | ICD-10-CM | POA: Diagnosis not present

## 2018-03-29 DIAGNOSIS — B351 Tinea unguium: Secondary | ICD-10-CM

## 2018-03-29 DIAGNOSIS — M79674 Pain in right toe(s): Secondary | ICD-10-CM

## 2018-03-29 NOTE — Progress Notes (Signed)
Complaint:  Visit Type: Patient returns to my office for continued preventative foot care services. Complaint: Patient states" my nails have grown long and thick and become painful to walk and wear shoes"  The patient presents for preventative foot care services. No changes to ROS  Podiatric Exam: Vascular: dorsalis pedis and posterior tibial pulses are palpable bilateral. Capillary return is immediate. Temperature gradient is WNL. Skin turgor WNL  Sensorium: Absent LOPS  B/L Nail Exam: Pt has thick disfigured discolored nails with subungual debris noted bilateral entire nail hallux  and second toes  B/L. Ulcer Exam: There is no evidence of ulcer or pre-ulcerative changes or infection. Orthopedic Exam: Muscle tone and strength are WNL. No limitations in general ROM. No crepitus or effusions noted. Foot type and digits show no abnormalities. Bony prominences are unremarkable. Mild arthritis pain midfoot right. Skin: No Porokeratosis. No infection or ulcers  Diagnosis:  Onychomycosis, , Pain in right toe, pain in left toes  Treatment & Plan Procedures and Treatment: Consent by patient was obtained for treatment procedures. The patient understood the discussion of treatment and procedures well. All questions were answered thoroughly reviewed. Debridement of mycotic and hypertrophic toenails, 1 through 5 bilateral and clearing of subungual debris. No ulceration, no infection noted. ABN signed for 2019. Return Visit-Office Procedure: Patient instructed to return to the office for a follow up visit 3 months for continued evaluation and treatment.    Gardiner Barefoot DPM

## 2018-06-06 DIAGNOSIS — Z23 Encounter for immunization: Secondary | ICD-10-CM | POA: Diagnosis not present

## 2018-06-06 DIAGNOSIS — M170 Bilateral primary osteoarthritis of knee: Secondary | ICD-10-CM | POA: Diagnosis not present

## 2018-06-12 DIAGNOSIS — H26493 Other secondary cataract, bilateral: Secondary | ICD-10-CM | POA: Diagnosis not present

## 2018-06-12 DIAGNOSIS — H43812 Vitreous degeneration, left eye: Secondary | ICD-10-CM | POA: Diagnosis not present

## 2018-06-13 DIAGNOSIS — M170 Bilateral primary osteoarthritis of knee: Secondary | ICD-10-CM | POA: Diagnosis not present

## 2018-06-20 DIAGNOSIS — M170 Bilateral primary osteoarthritis of knee: Secondary | ICD-10-CM | POA: Diagnosis not present

## 2018-06-28 ENCOUNTER — Encounter: Payer: Self-pay | Admitting: Podiatry

## 2018-06-28 ENCOUNTER — Ambulatory Visit (INDEPENDENT_AMBULATORY_CARE_PROVIDER_SITE_OTHER): Payer: Medicare Other | Admitting: Podiatry

## 2018-06-28 DIAGNOSIS — M79675 Pain in left toe(s): Secondary | ICD-10-CM | POA: Diagnosis not present

## 2018-06-28 DIAGNOSIS — B351 Tinea unguium: Secondary | ICD-10-CM | POA: Diagnosis not present

## 2018-06-28 DIAGNOSIS — M79674 Pain in right toe(s): Secondary | ICD-10-CM

## 2018-06-28 NOTE — Progress Notes (Signed)
Complaint:  Visit Type: Patient returns to my office for continued preventative foot care services. Complaint: Patient states" my nails have grown long and thick and become painful to walk and wear shoes"  The patient presents for preventative foot care services. No changes to ROS  Podiatric Exam: Vascular: dorsalis pedis and posterior tibial pulses are palpable bilateral. Capillary return is immediate. Temperature gradient is WNL. Skin turgor WNL  Sensorium: Absent LOPS  B/L Nail Exam: Pt has thick disfigured discolored nails with subungual debris noted bilateral entire nail hallux  and second toes  B/L. Ulcer Exam: There is no evidence of ulcer or pre-ulcerative changes or infection. Orthopedic Exam: Muscle tone and strength are WNL. No limitations in general ROM. No crepitus or effusions noted. Foot type and digits show no abnormalities. Bony prominences are unremarkable. Mild arthritis pain midfoot right. Skin: No Porokeratosis. No infection or ulcers  Diagnosis:  Onychomycosis, , Pain in right toe, pain in left toes  Treatment & Plan Procedures and Treatment: Consent by patient was obtained for treatment procedures. The patient understood the discussion of treatment and procedures well. All questions were answered thoroughly reviewed. Debridement of mycotic and hypertrophic toenails, 1 through 5 bilateral and clearing of subungual debris. No ulceration, no infection noted. ABN signed for 2019. Patient was told to discuss compression socks with pcp.  No foot swelling but lower leg swelling. Return Visit-Office Procedure: Patient instructed to return to the office for a follow up visit 3 months for continued evaluation and treatment.    Gardiner Barefoot DPM

## 2018-07-06 DIAGNOSIS — L711 Rhinophyma: Secondary | ICD-10-CM | POA: Diagnosis not present

## 2018-07-06 DIAGNOSIS — B078 Other viral warts: Secondary | ICD-10-CM | POA: Diagnosis not present

## 2018-07-14 DIAGNOSIS — I129 Hypertensive chronic kidney disease with stage 1 through stage 4 chronic kidney disease, or unspecified chronic kidney disease: Secondary | ICD-10-CM | POA: Diagnosis not present

## 2018-07-14 DIAGNOSIS — M79605 Pain in left leg: Secondary | ICD-10-CM | POA: Diagnosis not present

## 2018-07-14 DIAGNOSIS — Z1389 Encounter for screening for other disorder: Secondary | ICD-10-CM | POA: Diagnosis not present

## 2018-07-14 DIAGNOSIS — M79604 Pain in right leg: Secondary | ICD-10-CM | POA: Diagnosis not present

## 2018-07-14 DIAGNOSIS — I5030 Unspecified diastolic (congestive) heart failure: Secondary | ICD-10-CM | POA: Diagnosis not present

## 2018-07-14 DIAGNOSIS — Z1211 Encounter for screening for malignant neoplasm of colon: Secondary | ICD-10-CM | POA: Diagnosis not present

## 2018-07-14 DIAGNOSIS — Z0000 Encounter for general adult medical examination without abnormal findings: Secondary | ICD-10-CM | POA: Diagnosis not present

## 2018-07-14 DIAGNOSIS — R7301 Impaired fasting glucose: Secondary | ICD-10-CM | POA: Diagnosis not present

## 2018-07-14 DIAGNOSIS — M353 Polymyalgia rheumatica: Secondary | ICD-10-CM | POA: Diagnosis not present

## 2018-07-14 DIAGNOSIS — N183 Chronic kidney disease, stage 3 (moderate): Secondary | ICD-10-CM | POA: Diagnosis not present

## 2018-07-14 DIAGNOSIS — G4733 Obstructive sleep apnea (adult) (pediatric): Secondary | ICD-10-CM | POA: Diagnosis not present

## 2018-08-27 DIAGNOSIS — S8262XA Displaced fracture of lateral malleolus of left fibula, initial encounter for closed fracture: Secondary | ICD-10-CM | POA: Diagnosis not present

## 2018-09-04 DIAGNOSIS — M25572 Pain in left ankle and joints of left foot: Secondary | ICD-10-CM | POA: Diagnosis not present

## 2018-09-15 DIAGNOSIS — R05 Cough: Secondary | ICD-10-CM | POA: Diagnosis not present

## 2018-09-27 ENCOUNTER — Encounter: Payer: Self-pay | Admitting: Podiatry

## 2018-09-27 ENCOUNTER — Ambulatory Visit (INDEPENDENT_AMBULATORY_CARE_PROVIDER_SITE_OTHER): Payer: Medicare Other | Admitting: Podiatry

## 2018-09-27 DIAGNOSIS — M79674 Pain in right toe(s): Secondary | ICD-10-CM

## 2018-09-27 DIAGNOSIS — M79675 Pain in left toe(s): Secondary | ICD-10-CM

## 2018-09-27 DIAGNOSIS — B351 Tinea unguium: Secondary | ICD-10-CM | POA: Diagnosis not present

## 2018-09-27 NOTE — Progress Notes (Signed)
Complaint:  Visit Type: Patient returns to my office for continued preventative foot care services. Complaint: Patient states" my nails have grown long and thick and become painful to walk and wear shoes"  The patient presents for preventative foot care services. No changes to ROS  Podiatric Exam: Vascular: dorsalis pedis and posterior tibial pulses are palpable bilateral. Capillary return is immediate. Temperature gradient is WNL. Skin turgor WNL  Sensorium: Absent LOPS  B/L Nail Exam: Pt has thick disfigured discolored nails with subungual debris noted bilateral entire nail hallux  and second toes  B/L. Ulcer Exam: There is no evidence of ulcer or pre-ulcerative changes or infection. Orthopedic Exam: Muscle tone and strength are WNL. No limitations in general ROM. No crepitus or effusions noted. Foot type and digits show no abnormalities. Bony prominences are unremarkable. Mild arthritis pain midfoot right. Skin: No Porokeratosis. No infection or ulcers  Diagnosis:  Onychomycosis, , Pain in right toe, pain in left toes  Treatment & Plan Procedures and Treatment: Consent by patient was obtained for treatment procedures. The patient understood the discussion of treatment and procedures well. All questions were answered thoroughly reviewed. Debridement of mycotic and hypertrophic toenails, 1 through 5 bilateral and clearing of subungual debris. No ulceration, no infection noted. Return Visit-Office Procedure: Patient instructed to return to the office for a follow up visit 3 months for continued evaluation and treatment.    Gardiner Barefoot DPM

## 2018-12-27 ENCOUNTER — Ambulatory Visit (INDEPENDENT_AMBULATORY_CARE_PROVIDER_SITE_OTHER): Payer: Medicare Other | Admitting: Podiatry

## 2018-12-27 ENCOUNTER — Other Ambulatory Visit: Payer: Self-pay

## 2018-12-27 ENCOUNTER — Encounter: Payer: Self-pay | Admitting: Podiatry

## 2018-12-27 VITALS — Temp 97.7°F

## 2018-12-27 DIAGNOSIS — M79675 Pain in left toe(s): Secondary | ICD-10-CM | POA: Diagnosis not present

## 2018-12-27 DIAGNOSIS — B351 Tinea unguium: Secondary | ICD-10-CM | POA: Diagnosis not present

## 2018-12-27 DIAGNOSIS — M79674 Pain in right toe(s): Secondary | ICD-10-CM | POA: Diagnosis not present

## 2018-12-27 NOTE — Progress Notes (Signed)
Complaint:  Visit Type: Patient returns to my office for continued preventative foot care services. Complaint: Patient states" my nails have grown long and thick and become painful to walk and wear shoes"  The patient presents for preventative foot care services. No changes to ROS  Podiatric Exam: Vascular: dorsalis pedis and posterior tibial pulses are palpable bilateral. Capillary return is immediate. Temperature gradient is WNL. Skin turgor WNL  Sensorium: Absent LOPS  B/L Nail Exam: Pt has thick disfigured discolored nails with subungual debris noted bilateral entire nail hallux  and second toes  B/L. Ulcer Exam: There is no evidence of ulcer or pre-ulcerative changes or infection. Orthopedic Exam: Muscle tone and strength are WNL. No limitations in general ROM. No crepitus or effusions noted. Foot type and digits show no abnormalities. Bony prominences are unremarkable. Mild arthritis pain midfoot right. Skin: No Porokeratosis. No infection or ulcers  Diagnosis:  Onychomycosis, , Pain in right toe, pain in left toes  Treatment & Plan Procedures and Treatment: Consent by patient was obtained for treatment procedures. The patient understood the discussion of treatment and procedures well. All questions were answered thoroughly reviewed. Debridement of mycotic and hypertrophic toenails, 1 through 5 bilateral and clearing of subungual debris. No ulceration, no infection noted. Return Visit-Office Procedure: Patient instructed to return to the office for a follow up visit 3 months for continued evaluation and treatment.    Gardiner Barefoot DPM

## 2019-01-01 DIAGNOSIS — I129 Hypertensive chronic kidney disease with stage 1 through stage 4 chronic kidney disease, or unspecified chronic kidney disease: Secondary | ICD-10-CM | POA: Diagnosis not present

## 2019-01-01 DIAGNOSIS — M353 Polymyalgia rheumatica: Secondary | ICD-10-CM | POA: Diagnosis not present

## 2019-01-01 DIAGNOSIS — I5030 Unspecified diastolic (congestive) heart failure: Secondary | ICD-10-CM | POA: Diagnosis not present

## 2019-01-01 DIAGNOSIS — N183 Chronic kidney disease, stage 3 (moderate): Secondary | ICD-10-CM | POA: Diagnosis not present

## 2019-01-03 DIAGNOSIS — M353 Polymyalgia rheumatica: Secondary | ICD-10-CM | POA: Diagnosis not present

## 2019-01-03 DIAGNOSIS — I129 Hypertensive chronic kidney disease with stage 1 through stage 4 chronic kidney disease, or unspecified chronic kidney disease: Secondary | ICD-10-CM | POA: Diagnosis not present

## 2019-01-03 DIAGNOSIS — N183 Chronic kidney disease, stage 3 (moderate): Secondary | ICD-10-CM | POA: Diagnosis not present

## 2019-01-11 DIAGNOSIS — M170 Bilateral primary osteoarthritis of knee: Secondary | ICD-10-CM | POA: Diagnosis not present

## 2019-01-18 DIAGNOSIS — M170 Bilateral primary osteoarthritis of knee: Secondary | ICD-10-CM | POA: Diagnosis not present

## 2019-01-25 DIAGNOSIS — M170 Bilateral primary osteoarthritis of knee: Secondary | ICD-10-CM | POA: Diagnosis not present

## 2019-01-29 DIAGNOSIS — M353 Polymyalgia rheumatica: Secondary | ICD-10-CM | POA: Diagnosis not present

## 2019-03-13 DIAGNOSIS — C44319 Basal cell carcinoma of skin of other parts of face: Secondary | ICD-10-CM | POA: Diagnosis not present

## 2019-03-13 DIAGNOSIS — L718 Other rosacea: Secondary | ICD-10-CM | POA: Diagnosis not present

## 2019-03-13 DIAGNOSIS — L821 Other seborrheic keratosis: Secondary | ICD-10-CM | POA: Diagnosis not present

## 2019-03-13 DIAGNOSIS — D225 Melanocytic nevi of trunk: Secondary | ICD-10-CM | POA: Diagnosis not present

## 2019-03-13 DIAGNOSIS — M25511 Pain in right shoulder: Secondary | ICD-10-CM | POA: Diagnosis not present

## 2019-04-04 ENCOUNTER — Other Ambulatory Visit: Payer: Self-pay

## 2019-04-04 ENCOUNTER — Encounter: Payer: Self-pay | Admitting: Podiatry

## 2019-04-04 ENCOUNTER — Ambulatory Visit (INDEPENDENT_AMBULATORY_CARE_PROVIDER_SITE_OTHER): Payer: Medicare Other | Admitting: Podiatry

## 2019-04-04 DIAGNOSIS — M79675 Pain in left toe(s): Secondary | ICD-10-CM

## 2019-04-04 DIAGNOSIS — B351 Tinea unguium: Secondary | ICD-10-CM

## 2019-04-04 DIAGNOSIS — M79674 Pain in right toe(s): Secondary | ICD-10-CM

## 2019-04-04 NOTE — Progress Notes (Signed)
Complaint:  Visit Type: Patient returns to my office for continued preventative foot care services. Complaint: Patient states" my nails have grown long and thick and become painful to walk and wear shoes"  The patient presents for preventative foot care services. No changes to ROS  Podiatric Exam: Vascular: dorsalis pedis and posterior tibial pulses are weakly  palpable bilateral. Capillary return is immediate. Temperature gradient is WNL. Skin turgor WNL  Sensorium: Absent LOPS  B/L Nail Exam: Pt has thick disfigured discolored nails with subungual debris noted bilateral entire nail hallux  and second toes  B/L. Ulcer Exam: There is no evidence of ulcer or pre-ulcerative changes or infection. Orthopedic Exam: Muscle tone and strength are WNL. No limitations in general ROM. No crepitus or effusions noted. Foot type and digits show no abnormalities. Bony prominences are unremarkable. Mild arthritis pain midfoot right. Skin: No Porokeratosis. No infection or ulcers  Diagnosis:  Onychomycosis, , Pain in right toe, pain in left toes  Treatment & Plan Procedures and Treatment: Consent by patient was obtained for treatment procedures. The patient understood the discussion of treatment and procedures well. All questions were answered thoroughly reviewed. Debridement of mycotic and hypertrophic toenails, 1 through 5 bilateral and clearing of subungual debris. No ulceration, no infection noted. Return Visit-Office Procedure: Patient instructed to return to the office for a follow up visit 3 months for continued evaluation and treatment.    Gardiner Barefoot DPM

## 2019-04-17 DIAGNOSIS — C44319 Basal cell carcinoma of skin of other parts of face: Secondary | ICD-10-CM | POA: Diagnosis not present

## 2019-05-07 DIAGNOSIS — M545 Low back pain: Secondary | ICD-10-CM | POA: Diagnosis not present

## 2019-05-07 DIAGNOSIS — M25551 Pain in right hip: Secondary | ICD-10-CM | POA: Diagnosis not present

## 2019-05-15 ENCOUNTER — Other Ambulatory Visit: Payer: Self-pay | Admitting: Orthopedic Surgery

## 2019-05-15 DIAGNOSIS — Z85828 Personal history of other malignant neoplasm of skin: Secondary | ICD-10-CM | POA: Diagnosis not present

## 2019-05-15 DIAGNOSIS — M545 Low back pain, unspecified: Secondary | ICD-10-CM

## 2019-05-15 DIAGNOSIS — Z08 Encounter for follow-up examination after completed treatment for malignant neoplasm: Secondary | ICD-10-CM | POA: Diagnosis not present

## 2019-05-15 DIAGNOSIS — M25551 Pain in right hip: Secondary | ICD-10-CM | POA: Diagnosis not present

## 2019-05-29 DIAGNOSIS — Z23 Encounter for immunization: Secondary | ICD-10-CM | POA: Diagnosis not present

## 2019-06-04 ENCOUNTER — Other Ambulatory Visit: Payer: Self-pay

## 2019-06-04 ENCOUNTER — Ambulatory Visit
Admission: RE | Admit: 2019-06-04 | Discharge: 2019-06-04 | Disposition: A | Discharge status: Home / Self Care | Payer: Medicare Other | Source: Ambulatory Visit | Attending: Orthopedic Surgery | Admitting: Orthopedic Surgery

## 2019-06-04 DIAGNOSIS — M545 Low back pain, unspecified: Secondary | ICD-10-CM

## 2019-06-04 DIAGNOSIS — M1611 Unilateral primary osteoarthritis, right hip: Secondary | ICD-10-CM | POA: Diagnosis not present

## 2019-06-04 DIAGNOSIS — S32301A Unspecified fracture of right ilium, initial encounter for closed fracture: Secondary | ICD-10-CM | POA: Diagnosis not present

## 2019-06-04 DIAGNOSIS — M48061 Spinal stenosis, lumbar region without neurogenic claudication: Secondary | ICD-10-CM | POA: Diagnosis not present

## 2019-06-07 DIAGNOSIS — M25551 Pain in right hip: Secondary | ICD-10-CM | POA: Diagnosis not present

## 2019-06-11 DIAGNOSIS — M545 Low back pain: Secondary | ICD-10-CM | POA: Diagnosis not present

## 2019-06-11 DIAGNOSIS — M7061 Trochanteric bursitis, right hip: Secondary | ICD-10-CM | POA: Diagnosis not present

## 2019-06-13 DIAGNOSIS — M7061 Trochanteric bursitis, right hip: Secondary | ICD-10-CM | POA: Diagnosis not present

## 2019-07-04 ENCOUNTER — Encounter: Payer: Self-pay | Admitting: Podiatry

## 2019-07-04 ENCOUNTER — Ambulatory Visit (INDEPENDENT_AMBULATORY_CARE_PROVIDER_SITE_OTHER): Payer: Medicare Other | Admitting: Podiatry

## 2019-07-04 ENCOUNTER — Other Ambulatory Visit: Payer: Self-pay

## 2019-07-04 DIAGNOSIS — B351 Tinea unguium: Secondary | ICD-10-CM | POA: Diagnosis not present

## 2019-07-04 DIAGNOSIS — M79674 Pain in right toe(s): Secondary | ICD-10-CM | POA: Diagnosis not present

## 2019-07-04 DIAGNOSIS — M79675 Pain in left toe(s): Secondary | ICD-10-CM

## 2019-07-04 NOTE — Progress Notes (Signed)
Complaint:  Visit Type: Patient returns to my office for continued preventative foot care services. Complaint: Patient states" my nails have grown long and thick and become painful to walk and wear shoes"  The patient presents for preventative foot care services. No changes to ROS  Podiatric Exam: Vascular: dorsalis pedis and posterior tibial pulses are weakly  palpable bilateral. Capillary return is immediate. Temperature gradient is WNL. Skin turgor WNL  Sensorium: Absent LOPS  B/L Nail Exam: Pt has thick disfigured discolored nails with subungual debris noted bilateral entire nail hallux  and second toes  B/L. Ulcer Exam: There is no evidence of ulcer or pre-ulcerative changes or infection. Orthopedic Exam: Muscle tone and strength are WNL. No limitations in general ROM. No crepitus or effusions noted. Foot type and digits show no abnormalities. Bony prominences are unremarkable. Mild arthritis pain midfoot right. Skin: No Porokeratosis. No infection or ulcers  Diagnosis:  Onychomycosis, , Pain in right toe, pain in left toes  Treatment & Plan Procedures and Treatment: Consent by patient was obtained for treatment procedures. The patient understood the discussion of treatment and procedures well. All questions were answered thoroughly reviewed. Debridement of mycotic and hypertrophic toenails, 1 through 5 bilateral and clearing of subungual debris. No ulceration, no infection noted. Return Visit-Office Procedure: Patient instructed to return to the office for a follow up visit 3 months for continued evaluation and treatment.    Gardiner Barefoot DPM

## 2019-07-16 DIAGNOSIS — M545 Low back pain: Secondary | ICD-10-CM | POA: Diagnosis not present

## 2019-07-16 DIAGNOSIS — M7061 Trochanteric bursitis, right hip: Secondary | ICD-10-CM | POA: Diagnosis not present

## 2019-08-06 ENCOUNTER — Other Ambulatory Visit: Payer: Self-pay

## 2019-08-06 ENCOUNTER — Other Ambulatory Visit (HOSPITAL_COMMUNITY): Payer: Self-pay | Admitting: Orthopedic Surgery

## 2019-08-06 ENCOUNTER — Ambulatory Visit (HOSPITAL_COMMUNITY)
Admission: RE | Admit: 2019-08-06 | Discharge: 2019-08-06 | Disposition: A | Discharge status: Home / Self Care | Payer: Medicare Other | Source: Ambulatory Visit | Attending: Surgery | Admitting: Surgery

## 2019-08-06 DIAGNOSIS — M79605 Pain in left leg: Secondary | ICD-10-CM | POA: Insufficient documentation

## 2019-08-06 DIAGNOSIS — M7989 Other specified soft tissue disorders: Secondary | ICD-10-CM

## 2019-08-08 DIAGNOSIS — I5030 Unspecified diastolic (congestive) heart failure: Secondary | ICD-10-CM | POA: Diagnosis not present

## 2019-08-08 DIAGNOSIS — R7301 Impaired fasting glucose: Secondary | ICD-10-CM | POA: Diagnosis not present

## 2019-08-08 DIAGNOSIS — I129 Hypertensive chronic kidney disease with stage 1 through stage 4 chronic kidney disease, or unspecified chronic kidney disease: Secondary | ICD-10-CM | POA: Diagnosis not present

## 2019-08-08 DIAGNOSIS — M79672 Pain in left foot: Secondary | ICD-10-CM | POA: Diagnosis not present

## 2019-08-08 DIAGNOSIS — N183 Chronic kidney disease, stage 3 unspecified: Secondary | ICD-10-CM | POA: Diagnosis not present

## 2019-08-08 DIAGNOSIS — M353 Polymyalgia rheumatica: Secondary | ICD-10-CM | POA: Diagnosis not present

## 2019-08-20 DIAGNOSIS — Z23 Encounter for immunization: Secondary | ICD-10-CM | POA: Diagnosis not present

## 2019-08-21 DIAGNOSIS — E1169 Type 2 diabetes mellitus with other specified complication: Secondary | ICD-10-CM | POA: Diagnosis not present

## 2019-08-21 DIAGNOSIS — I5030 Unspecified diastolic (congestive) heart failure: Secondary | ICD-10-CM | POA: Diagnosis not present

## 2019-08-21 DIAGNOSIS — M1A3720 Chronic gout due to renal impairment, left ankle and foot, without tophus (tophi): Secondary | ICD-10-CM | POA: Diagnosis not present

## 2019-08-21 DIAGNOSIS — N183 Chronic kidney disease, stage 3 unspecified: Secondary | ICD-10-CM | POA: Diagnosis not present

## 2019-08-21 DIAGNOSIS — R202 Paresthesia of skin: Secondary | ICD-10-CM | POA: Diagnosis not present

## 2019-08-29 DIAGNOSIS — N183 Chronic kidney disease, stage 3 unspecified: Secondary | ICD-10-CM | POA: Diagnosis not present

## 2019-09-04 DIAGNOSIS — N183 Chronic kidney disease, stage 3 unspecified: Secondary | ICD-10-CM | POA: Diagnosis not present

## 2019-09-04 DIAGNOSIS — I5030 Unspecified diastolic (congestive) heart failure: Secondary | ICD-10-CM | POA: Diagnosis not present

## 2019-09-04 DIAGNOSIS — R202 Paresthesia of skin: Secondary | ICD-10-CM | POA: Diagnosis not present

## 2019-09-04 DIAGNOSIS — M10372 Gout due to renal impairment, left ankle and foot: Secondary | ICD-10-CM | POA: Diagnosis not present

## 2019-09-04 DIAGNOSIS — I129 Hypertensive chronic kidney disease with stage 1 through stage 4 chronic kidney disease, or unspecified chronic kidney disease: Secondary | ICD-10-CM | POA: Diagnosis not present

## 2019-09-11 DIAGNOSIS — Z08 Encounter for follow-up examination after completed treatment for malignant neoplasm: Secondary | ICD-10-CM | POA: Diagnosis not present

## 2019-09-11 DIAGNOSIS — Z85828 Personal history of other malignant neoplasm of skin: Secondary | ICD-10-CM | POA: Diagnosis not present

## 2019-09-19 DIAGNOSIS — N184 Chronic kidney disease, stage 4 (severe): Secondary | ICD-10-CM | POA: Diagnosis not present

## 2019-09-19 DIAGNOSIS — N189 Chronic kidney disease, unspecified: Secondary | ICD-10-CM | POA: Diagnosis not present

## 2019-09-19 DIAGNOSIS — M10372 Gout due to renal impairment, left ankle and foot: Secondary | ICD-10-CM | POA: Diagnosis not present

## 2019-09-27 ENCOUNTER — Other Ambulatory Visit: Payer: Self-pay | Admitting: Internal Medicine

## 2019-09-27 DIAGNOSIS — N184 Chronic kidney disease, stage 4 (severe): Secondary | ICD-10-CM

## 2019-09-28 ENCOUNTER — Ambulatory Visit
Admission: RE | Admit: 2019-09-28 | Discharge: 2019-09-28 | Disposition: A | Discharge status: Home / Self Care | Payer: Medicare Other | Source: Ambulatory Visit | Attending: Internal Medicine | Admitting: Internal Medicine

## 2019-09-28 DIAGNOSIS — N184 Chronic kidney disease, stage 4 (severe): Secondary | ICD-10-CM

## 2019-10-01 DIAGNOSIS — I5030 Unspecified diastolic (congestive) heart failure: Secondary | ICD-10-CM | POA: Diagnosis not present

## 2019-10-01 DIAGNOSIS — M10372 Gout due to renal impairment, left ankle and foot: Secondary | ICD-10-CM | POA: Diagnosis not present

## 2019-10-01 DIAGNOSIS — R202 Paresthesia of skin: Secondary | ICD-10-CM | POA: Diagnosis not present

## 2019-10-01 DIAGNOSIS — Z5181 Encounter for therapeutic drug level monitoring: Secondary | ICD-10-CM | POA: Diagnosis not present

## 2019-10-01 DIAGNOSIS — Z6835 Body mass index (BMI) 35.0-35.9, adult: Secondary | ICD-10-CM | POA: Diagnosis not present

## 2019-10-03 ENCOUNTER — Encounter: Payer: Self-pay | Admitting: Podiatry

## 2019-10-03 ENCOUNTER — Ambulatory Visit (INDEPENDENT_AMBULATORY_CARE_PROVIDER_SITE_OTHER): Payer: Medicare Other | Admitting: Podiatry

## 2019-10-03 ENCOUNTER — Other Ambulatory Visit: Payer: Self-pay

## 2019-10-03 VITALS — Temp 97.3°F

## 2019-10-03 DIAGNOSIS — B351 Tinea unguium: Secondary | ICD-10-CM

## 2019-10-03 DIAGNOSIS — M79674 Pain in right toe(s): Secondary | ICD-10-CM | POA: Diagnosis not present

## 2019-10-03 DIAGNOSIS — M79675 Pain in left toe(s): Secondary | ICD-10-CM

## 2019-10-03 NOTE — Progress Notes (Signed)
Complaint:  Visit Type: Patient returns to my office for continued preventative foot care services. Complaint: Patient states" my nails have grown long and thick and become painful to walk and wear shoes"  The patient presents for preventative foot care services. No changes to ROS.  Patient has stage 3 kidney disease.  Patient says he has been treated for gout by his medical doctor.  Podiatric Exam: Vascular: dorsalis pedis and posterior tibial pulses are weakly  palpable bilateral. Capillary return is immediate. Temperature gradient is WNL. Skin turgor WNL  Sensorium: Absent LOPS  B/L Nail Exam: Pt has thick disfigured discolored nails with subungual debris noted bilateral entire nail hallux  and second toes  B/L. Ulcer Exam: There is no evidence of ulcer or pre-ulcerative changes or infection. Orthopedic Exam: Muscle tone and strength are WNL. No limitations in general ROM. No crepitus or effusions noted. Foot type and digits show no abnormalities. Bony prominences are unremarkable. Mild arthritis pain midfoot right. Skin: No Porokeratosis. No infection or ulcers  Diagnosis:  Onychomycosis, , Pain in right toe, pain in left toes  Treatment & Plan Procedures and Treatment: Consent by patient was obtained for treatment procedures. The patient understood the discussion of treatment and procedures well. All questions were answered thoroughly reviewed. Debridement of mycotic and hypertrophic toenails, 1 through 5 bilateral and clearing of subungual debris. No ulceration, no infection noted. Return Visit-Office Procedure: Patient instructed to return to the office for a follow up visit 3 months for continued evaluation and treatment.    Gardiner Barefoot DPM

## 2019-10-04 DIAGNOSIS — M7061 Trochanteric bursitis, right hip: Secondary | ICD-10-CM | POA: Diagnosis not present

## 2019-10-09 DIAGNOSIS — M10372 Gout due to renal impairment, left ankle and foot: Secondary | ICD-10-CM | POA: Diagnosis not present

## 2019-10-09 DIAGNOSIS — N184 Chronic kidney disease, stage 4 (severe): Secondary | ICD-10-CM | POA: Diagnosis not present

## 2019-10-09 DIAGNOSIS — M169 Osteoarthritis of hip, unspecified: Secondary | ICD-10-CM | POA: Diagnosis not present

## 2019-10-09 DIAGNOSIS — N400 Enlarged prostate without lower urinary tract symptoms: Secondary | ICD-10-CM | POA: Diagnosis not present

## 2019-10-09 DIAGNOSIS — I129 Hypertensive chronic kidney disease with stage 1 through stage 4 chronic kidney disease, or unspecified chronic kidney disease: Secondary | ICD-10-CM | POA: Diagnosis not present

## 2019-10-09 DIAGNOSIS — I5030 Unspecified diastolic (congestive) heart failure: Secondary | ICD-10-CM | POA: Diagnosis not present

## 2019-10-09 DIAGNOSIS — M179 Osteoarthritis of knee, unspecified: Secondary | ICD-10-CM | POA: Diagnosis not present

## 2019-10-11 DIAGNOSIS — E1122 Type 2 diabetes mellitus with diabetic chronic kidney disease: Secondary | ICD-10-CM | POA: Diagnosis not present

## 2019-10-11 DIAGNOSIS — I509 Heart failure, unspecified: Secondary | ICD-10-CM | POA: Diagnosis not present

## 2019-10-11 DIAGNOSIS — N184 Chronic kidney disease, stage 4 (severe): Secondary | ICD-10-CM | POA: Diagnosis not present

## 2019-10-11 DIAGNOSIS — M170 Bilateral primary osteoarthritis of knee: Secondary | ICD-10-CM | POA: Diagnosis not present

## 2019-10-11 DIAGNOSIS — I129 Hypertensive chronic kidney disease with stage 1 through stage 4 chronic kidney disease, or unspecified chronic kidney disease: Secondary | ICD-10-CM | POA: Diagnosis not present

## 2019-10-16 DIAGNOSIS — E871 Hypo-osmolality and hyponatremia: Secondary | ICD-10-CM | POA: Diagnosis not present

## 2019-10-17 DIAGNOSIS — M7071 Other bursitis of hip, right hip: Secondary | ICD-10-CM | POA: Diagnosis not present

## 2019-10-17 DIAGNOSIS — M545 Low back pain: Secondary | ICD-10-CM | POA: Diagnosis not present

## 2019-10-18 DIAGNOSIS — M170 Bilateral primary osteoarthritis of knee: Secondary | ICD-10-CM | POA: Diagnosis not present

## 2019-10-25 DIAGNOSIS — M170 Bilateral primary osteoarthritis of knee: Secondary | ICD-10-CM | POA: Diagnosis not present

## 2019-11-15 DIAGNOSIS — L720 Epidermal cyst: Secondary | ICD-10-CM | POA: Diagnosis not present

## 2019-11-15 DIAGNOSIS — R030 Elevated blood-pressure reading, without diagnosis of hypertension: Secondary | ICD-10-CM | POA: Diagnosis not present

## 2019-11-15 DIAGNOSIS — I129 Hypertensive chronic kidney disease with stage 1 through stage 4 chronic kidney disease, or unspecified chronic kidney disease: Secondary | ICD-10-CM | POA: Diagnosis not present

## 2019-11-16 DIAGNOSIS — M179 Osteoarthritis of knee, unspecified: Secondary | ICD-10-CM | POA: Diagnosis not present

## 2019-11-16 DIAGNOSIS — I5030 Unspecified diastolic (congestive) heart failure: Secondary | ICD-10-CM | POA: Diagnosis not present

## 2019-11-16 DIAGNOSIS — N184 Chronic kidney disease, stage 4 (severe): Secondary | ICD-10-CM | POA: Diagnosis not present

## 2019-11-16 DIAGNOSIS — N400 Enlarged prostate without lower urinary tract symptoms: Secondary | ICD-10-CM | POA: Diagnosis not present

## 2019-11-16 DIAGNOSIS — N183 Chronic kidney disease, stage 3 unspecified: Secondary | ICD-10-CM | POA: Diagnosis not present

## 2019-11-16 DIAGNOSIS — M169 Osteoarthritis of hip, unspecified: Secondary | ICD-10-CM | POA: Diagnosis not present

## 2019-11-16 DIAGNOSIS — M10372 Gout due to renal impairment, left ankle and foot: Secondary | ICD-10-CM | POA: Diagnosis not present

## 2019-11-16 DIAGNOSIS — I129 Hypertensive chronic kidney disease with stage 1 through stage 4 chronic kidney disease, or unspecified chronic kidney disease: Secondary | ICD-10-CM | POA: Diagnosis not present

## 2019-11-30 DIAGNOSIS — I129 Hypertensive chronic kidney disease with stage 1 through stage 4 chronic kidney disease, or unspecified chronic kidney disease: Secondary | ICD-10-CM | POA: Diagnosis not present

## 2019-12-24 DIAGNOSIS — N184 Chronic kidney disease, stage 4 (severe): Secondary | ICD-10-CM | POA: Diagnosis not present

## 2019-12-26 ENCOUNTER — Ambulatory Visit (INDEPENDENT_AMBULATORY_CARE_PROVIDER_SITE_OTHER): Payer: Medicare Other | Admitting: Plastic Surgery

## 2019-12-26 ENCOUNTER — Encounter: Payer: Self-pay | Admitting: Plastic Surgery

## 2019-12-26 VITALS — BP 130/71 | HR 73 | Temp 98.0°F | Ht 69.0 in | Wt 225.4 lb

## 2019-12-26 DIAGNOSIS — L989 Disorder of the skin and subcutaneous tissue, unspecified: Secondary | ICD-10-CM | POA: Diagnosis not present

## 2019-12-26 NOTE — Progress Notes (Signed)
Referring Provider Lavone Orn, MD [address] [place],   [address]   CC:  Chief Complaint  Patient presents with  . Consult    epidermal cyst of the left elbow      Robert Dickerson is an 79 y.o. male.  HPI: Patient is here to discuss a cyst on his left elbow.  Is been present for at least 6 months.  He was initially quite a bit bigger and has since gotten smaller.  It is intermittently painful.  He would like to be removed.  He has not drained or been treated previously.  Allergies  Allergen Reactions  . Ace Inhibitors Cough and Other (See Comments)    Other reaction(s): Cough Other reaction(s): Cough Other reaction(s): Cough Other reaction(s): Cough Other reaction(s): Cough   . Lisinopril Cough    Other reaction(s): Cough  . Oxycodone     "out of it" "out of it" "out of it"    Outpatient Encounter Medications as of 12/26/2019  Medication Sig  . amitriptyline (ELAVIL) 10 MG tablet Take 10 mg by mouth at bedtime.  Marland Kitchen amLODipine (NORVASC) 10 MG tablet Take 10 mg by mouth daily.  Marland Kitchen azithromycin (ZITHROMAX) 250 MG tablet   . cetirizine (ZYRTEC) 10 MG tablet Take 5 mg by mouth at bedtime.   . colchicine 0.6 MG tablet Take 0.3 mg by mouth daily.  . colesevelam (WELCHOL) 625 MG tablet   . econazole nitrate 1 % cream Apply 1 application topically daily as needed (rash).   Marland Kitchen erythromycin with ethanol (EMGEL) 2 % gel   . febuxostat (ULORIC) 40 MG tablet Take 40 mg by mouth daily.  . furosemide (LASIX) 80 MG tablet   . ibuprofen (ADVIL,MOTRIN) 800 MG tablet TK 1 T PO TID FOR 14 DAYS PRF PAIN  . losartan (COZAAR) 100 MG tablet Take 100 mg by mouth daily.  . metolazone (ZAROXOLYN) 5 MG tablet Take 5 mg by mouth daily.  . naproxen sodium (ALEVE) 220 MG tablet Take 220 mg by mouth.  Marland Kitchen omeprazole (PRILOSEC) 20 MG capsule Take by mouth.  . predniSONE (DELTASONE) 5 MG tablet   . tiZANidine (ZANAFLEX) 4 MG tablet TK 1 T PO Q 6 H FOR 7 DAYS  . triamcinolone  cream (KENALOG) 0.5 % Apply 1 application topically daily as needed (rash).  Marland Kitchen VIRTUSSIN A/C 100-10 MG/5ML syrup TK 5 MLS PO Q 4 H PRF COUGH  . vitamin B-12 (CYANOCOBALAMIN) 1000 MCG tablet Take 1,000 mcg by mouth daily.  Marland Kitchen allopurinol (ZYLOPRIM) 100 MG tablet Take 100 mg by mouth daily.  Marland Kitchen gabapentin (NEURONTIN) 100 MG capsule    No facility-administered encounter medications on file as of 12/26/2019.     Past Medical History:  Diagnosis Date  . Arthritis    all joints  . Chronic kidney disease, stage 3   . Congestive heart failure with LV diastolic dysfunction, NYHA class 2 ([place])   . GERD (gastroesophageal reflux disease)   . Hypertension   . Hypertensive chronic kidney disease   . Sleep apnea    uses cpap, pt does not know settings    Past Surgical History:  Procedure Laterality Date  . APPENDECTOMY    . BACK SURGERY     4 surg, lower  . CHOLECYSTECTOMY    . COLONOSCOPY WITH PROPOFOL N/A 06/11/2014   Procedure: COLONOSCOPY WITH PROPOFOL;  Surgeon: Garlan Fair, MD;  Location: [facility];  Service: Endoscopy;  Laterality: N/A;  . ESOPHAGOGASTRODUODENOSCOPY  08/26/2011   Procedure: ESOPHAGOGASTRODUODENOSCOPY (EGD);  Surgeon: Garlan Fair, MD;  Location: [facility];  Service: Endoscopy;  Laterality: N/A;  . ESOPHAGOGASTRODUODENOSCOPY (EGD) WITH PROPOFOL N/A 06/11/2014   Procedure: ESOPHAGOGASTRODUODENOSCOPY (EGD) WITH PROPOFOL;  Surgeon: Garlan Fair, MD;  Location: [facility];  Service: Endoscopy;  Laterality: N/A;  . EUS  09/15/2011   Procedure: UPPER ENDOSCOPIC ULTRASOUND (EUS) LINEAR;  Surgeon: Landry Dyke, MD;  Location: [facility];  Service: Endoscopy;  Laterality: N/A;  MAC  . KNEE ARTHROSCOPY Right YEARS AGO    Family History  Problem Relation Age of Onset  . Anesthesia problems Neg Hx   . Hypotension Neg Hx   . Malignant hyperthermia Neg Hx   . Pseudochol deficiency Neg Hx     Social History   Social History Narrative  . Not on file       Review of Systems General: Denies fevers, chills, weight loss CV: Denies chest pain, shortness of breath, palpitations  Physical Exam Vitals with BMI 12/26/2019 11/25/2015 11/23/2015  Height _0  - -  Weight 225 lbs 6 oz - -  BMI 56.25 - -  Systolic [phone]  Diastolic 71 76 73  Pulse 73 80 81    General:  No acute distress,  Alert and oriented, Non-Toxic, Normal speech and affect Left elbow: He has a 2 cm firm subcutaneous mass.  It is mobile.  There is no overlying skin changes.  Assessment/Plan Patient presents with a small subcutaneous mass on the posterior aspect of the left elbow.  We discussed excision.  We discussed the risk that include bleeding, infection, damage to surrounding structures, and need for additional procedures.  We discussed the potential for further procedures depending on the pathology.  He is fully understanding and will move forward with scheduling this under local in the office.  Cindra Presume 12/26/2019, 10:26 AM

## 2020-01-03 DIAGNOSIS — R202 Paresthesia of skin: Secondary | ICD-10-CM | POA: Diagnosis not present

## 2020-01-04 ENCOUNTER — Other Ambulatory Visit: Payer: Self-pay

## 2020-01-04 ENCOUNTER — Ambulatory Visit (INDEPENDENT_AMBULATORY_CARE_PROVIDER_SITE_OTHER): Payer: Medicare Other | Admitting: Podiatry

## 2020-01-04 ENCOUNTER — Encounter: Payer: Self-pay | Admitting: Podiatry

## 2020-01-04 DIAGNOSIS — M79674 Pain in right toe(s): Secondary | ICD-10-CM

## 2020-01-04 DIAGNOSIS — I509 Heart failure, unspecified: Secondary | ICD-10-CM | POA: Diagnosis not present

## 2020-01-04 DIAGNOSIS — E1122 Type 2 diabetes mellitus with diabetic chronic kidney disease: Secondary | ICD-10-CM | POA: Diagnosis not present

## 2020-01-04 DIAGNOSIS — M79675 Pain in left toe(s): Secondary | ICD-10-CM

## 2020-01-04 DIAGNOSIS — B351 Tinea unguium: Secondary | ICD-10-CM

## 2020-01-04 DIAGNOSIS — N184 Chronic kidney disease, stage 4 (severe): Secondary | ICD-10-CM | POA: Diagnosis not present

## 2020-01-04 DIAGNOSIS — I129 Hypertensive chronic kidney disease with stage 1 through stage 4 chronic kidney disease, or unspecified chronic kidney disease: Secondary | ICD-10-CM | POA: Diagnosis not present

## 2020-01-04 NOTE — Progress Notes (Signed)
This patient returns to the office for evaluation and treatment of long thick painful nails .  This patient is unable to trim his own nails since the patient cannot reach his feet.  Patient says the nails are painful walking and wearing his shoes.  He returns for preventive foot care services.  General Appearance  Alert, conversant and in no acute stress.  Vascular  Dorsalis pedis and posterior tibial  pulses are palpable  bilaterally.  Capillary return is within normal limits  bilaterally. Temperature is within normal limits  bilaterally.  Neurologic  Senn-Weinstein monofilament wire test within normal limits  bilaterally. Muscle power within normal limits bilaterally.  Nails Thick disfigured discolored nails with subungual debris  from hallux to second  toes bilaterally. No evidence of bacterial infection or drainage bilaterally.  Orthopedic  No limitations of motion  feet .  No crepitus or effusions noted.  No bony pathology or digital deformities noted.  Skin  normotropic skin with no porokeratosis noted bilaterally.  No signs of infections or ulcers noted.     Onychomycosis  Pain in toes right foot  Pain in toes left foot  Debridement  of nails  1-5  B/L with a nail nipper.  Nails were then filed using a dremel tool with no incidents.    RTC 3 months    Gardiner Barefoot DPM

## 2020-01-28 ENCOUNTER — Telehealth: Payer: Self-pay | Admitting: Plastic Surgery

## 2020-01-28 NOTE — Telephone Encounter (Signed)
Spoke with patient. Per Dr. Claudia Desanctis, he can just come at his scheduled appointment time and if it cannot be done then they can reschedule at that time.

## 2020-01-28 NOTE — Telephone Encounter (Signed)
Patient called to say that about two weeks ago he fell on his elbow and pulled some skin back and it's about a quarter inch away from the cyst that Dr. Claudia Desanctis is supposed to remove on 6/30. It has begun to scab and he wanted to know if Dr. Claudia Desanctis can still do the procedure. Patient offered to come in this week for Dr. Claudia Desanctis to take a look if he needs him to. [phone]. He said a message can be left telling him what to do.

## 2020-01-29 DIAGNOSIS — I129 Hypertensive chronic kidney disease with stage 1 through stage 4 chronic kidney disease, or unspecified chronic kidney disease: Secondary | ICD-10-CM | POA: Diagnosis not present

## 2020-01-30 ENCOUNTER — Ambulatory Visit (INDEPENDENT_AMBULATORY_CARE_PROVIDER_SITE_OTHER): Payer: Medicare Other | Admitting: Plastic Surgery

## 2020-01-30 ENCOUNTER — Other Ambulatory Visit (HOSPITAL_COMMUNITY)
Admission: RE | Admit: 2020-01-30 | Discharge: 2020-01-30 | Disposition: A | Discharge status: Home / Self Care | Payer: Medicare Other | Source: Ambulatory Visit | Attending: Plastic Surgery | Admitting: Plastic Surgery

## 2020-01-30 ENCOUNTER — Encounter: Payer: Self-pay | Admitting: Plastic Surgery

## 2020-01-30 ENCOUNTER — Other Ambulatory Visit: Payer: Self-pay

## 2020-01-30 VITALS — BP 123/55 | HR 89 | Temp 98.2°F | Ht 69.0 in | Wt 219.0 lb

## 2020-01-30 DIAGNOSIS — L989 Disorder of the skin and subcutaneous tissue, unspecified: Secondary | ICD-10-CM | POA: Diagnosis not present

## 2020-01-30 DIAGNOSIS — L729 Follicular cyst of the skin and subcutaneous tissue, unspecified: Secondary | ICD-10-CM | POA: Diagnosis not present

## 2020-01-30 DIAGNOSIS — L308 Other specified dermatitis: Secondary | ICD-10-CM | POA: Diagnosis not present

## 2020-01-30 NOTE — Progress Notes (Signed)
Operative Note   DATE OF OPERATION: 01/30/2020  LOCATION:    SURGICAL DEPARTMENT: Plastic Surgery  PREOPERATIVE DIAGNOSES: Left elbow cyst  POSTOPERATIVE DIAGNOSES:  same  PROCEDURE:  1. Excision of left elbow cyst measuring 2.5 cm 2. Complex closure measuring 2.5 cm  SURGEON: Talmadge Coventry, MD  ANESTHESIA:  Local  COMPLICATIONS: None.   INDICATIONS FOR PROCEDURE:  The patient, Robert Dickerson is a 79 y.o. male born on August 13, 1940, is here for treatment of left elbow cyst MRN: [id number]  CONSENT:  Informed consent was obtained directly from the patient. Risks, benefits and alternatives were fully discussed. Specific risks including but not limited to bleeding, infection, hematoma, seroma, scarring, pain, infection, wound healing problems, and need for further surgery were all discussed. The patient did have an ample opportunity to have questions answered to satisfaction.   DESCRIPTION OF PROCEDURE:  Local anesthesia was administered. The patient's operative site was prepped and draped in a sterile fashion. A time out was performed and all information was confirmed to be correct.  The lesion was excised with a 15 blade.  Hemostasis was obtained.  Circumferential undermining was performed and the skin was advanced and closed in layers with interrupted buried Monocryl sutures and running 4-0 Monocryl for the skin.  The lesion excised measured 2.5 cm, and the total length of closure measured 2.5 cm.    The patient tolerated the procedure well.  There were no complications.

## 2020-02-01 LAB — SURGICAL PATHOLOGY

## 2020-02-05 DIAGNOSIS — S46912A Strain of unspecified muscle, fascia and tendon at shoulder and upper arm level, left arm, initial encounter: Secondary | ICD-10-CM | POA: Diagnosis not present

## 2020-02-05 DIAGNOSIS — R0781 Pleurodynia: Secondary | ICD-10-CM | POA: Diagnosis not present

## 2020-02-13 ENCOUNTER — Encounter: Payer: Self-pay | Admitting: Podiatry

## 2020-02-13 ENCOUNTER — Ambulatory Visit (INDEPENDENT_AMBULATORY_CARE_PROVIDER_SITE_OTHER): Payer: Medicare Other | Admitting: Surgical

## 2020-02-13 ENCOUNTER — Other Ambulatory Visit: Payer: Self-pay

## 2020-02-13 ENCOUNTER — Ambulatory Visit (INDEPENDENT_AMBULATORY_CARE_PROVIDER_SITE_OTHER): Payer: Medicare Other | Admitting: Podiatry

## 2020-02-13 ENCOUNTER — Encounter: Payer: Self-pay | Admitting: Surgical

## 2020-02-13 VITALS — BP 136/72 | HR 76 | Temp 98.2°F

## 2020-02-13 DIAGNOSIS — L989 Disorder of the skin and subcutaneous tissue, unspecified: Secondary | ICD-10-CM

## 2020-02-13 DIAGNOSIS — G609 Hereditary and idiopathic neuropathy, unspecified: Secondary | ICD-10-CM

## 2020-02-13 DIAGNOSIS — R208 Other disturbances of skin sensation: Secondary | ICD-10-CM | POA: Diagnosis not present

## 2020-02-13 DIAGNOSIS — L84 Corns and callosities: Secondary | ICD-10-CM | POA: Diagnosis not present

## 2020-02-13 NOTE — Progress Notes (Signed)
Patient is a 79 year old male here for follow-up after excision of left elbow skin lesion on 01/30/2020 with Dr. Silverio Lay pace.  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 Patient is doing well, no complaints. No f/c/n/v.  On exam incision is well healed, he has an elbow scrape just adjacent to incision. No drainage or erythema noted. Sutures have dissolved. No foul odor. Non ttp. Scrape is healing well. No drainage.   Recommend vaseline to left elbow scrape. No further management needed for left elbow incision, it is well healed. No sign of infection, seroma, hematoma. Follow up as needed.

## 2020-02-13 NOTE — Patient Instructions (Signed)
Dr. Marlaine Hind reusable horseshoe pads for calluses on [place]  Peripheral Neuropathy Peripheral neuropathy is a type of nerve damage. It affects nerves that carry signals between the spinal cord and the arms, legs, and the rest of the body (peripheral nerves). It does not affect nerves in the spinal cord or brain. In peripheral neuropathy, one nerve or a group of nerves may be damaged. Peripheral neuropathy is a broad category that includes many specific nerve disorders, like diabetic neuropathy, hereditary neuropathy, and carpal tunnel syndrome. What are the causes? This condition may be caused by:  Diabetes. This is the most common cause of peripheral neuropathy.  Nerve injury.  Pressure or stress on a nerve that lasts a long time.  Lack (deficiency) of B vitamins. This can result from alcoholism, poor diet, or a restricted diet.  Infections.  Autoimmune diseases, such as rheumatoid arthritis and systemic lupus erythematosus.  Nerve diseases that are passed from parent to child (inherited).  Some medicines, such as cancer medicines (chemotherapy).  Poisonous (toxic) substances, such as lead and mercury.  Too little blood flowing to the legs.  Kidney disease.  Thyroid disease. In some cases, the cause of this condition is not known. What are the signs or symptoms? Symptoms of this condition depend on which of your nerves is damaged. Common symptoms include:  Loss of feeling (numbness) in the feet, hands, or both.  Tingling in the feet, hands, or both.  Burning pain.  Very sensitive skin.  Weakness.  Not being able to move a part of the body (paralysis).  Muscle twitching.  Clumsiness or poor coordination.  Loss of balance.  Not being able to control your bladder.  Feeling dizzy.  Sexual problems. How is this diagnosed? Diagnosing and finding the cause of peripheral neuropathy can be difficult. Your health care provider will take your medical history and do a  physical exam. A neurological exam will also be done. This involves checking things that are affected by your brain, spinal cord, and nerves (nervous system). For example, your health care provider will check your reflexes, how you move, and what you can feel. You may have other tests, such as:  Blood tests.  Electromyogram (EMG) and nerve conduction tests. These tests check nerve function and how well the nerves are controlling the muscles.  Imaging tests, such as CT scans or MRI to rule out other causes of your symptoms.  Removing a small piece of nerve to be examined in a lab (nerve biopsy). This is rare.  Removing and examining a small amount of the fluid that surrounds the brain and spinal cord (lumbar puncture). This is rare. How is this treated? Treatment for this condition may involve:  Treating the underlying cause of the neuropathy, such as diabetes, kidney disease, or vitamin deficiencies.  Stopping medicines that can cause neuropathy, such as chemotherapy.  Medicine to relieve pain. Medicines may include: ? Prescription or over-the-counter pain medicine. ? Antiseizure medicine. ? Antidepressants. ? Pain-relieving patches that are applied to painful areas of skin.  Surgery to relieve pressure on a nerve or to destroy a nerve that is causing pain.  Physical therapy to help improve movement and balance.  Devices to help you move around (assistive devices). Follow these instructions at home: Medicines  Take over-the-counter and prescription medicines only as told by your health care provider. Do not take any other medicines without first asking your health care provider.  Do not drive or use heavy machinery while taking prescription pain medicine. Lifestyle  Do not use any products that contain nicotine or tobacco, such as cigarettes and e-cigarettes. Smoking keeps blood from reaching damaged nerves. If you need help quitting, ask your health care provider.  Avoid or  limit alcohol. Too much alcohol can cause a vitamin B deficiency, and vitamin B is needed for healthy nerves.  Eat a healthy diet. This includes: ? Eating foods that are high in fiber, such as fresh fruits and vegetables, whole grains, and beans. ? Limiting foods that are high in fat and processed sugars, such as fried or sweet foods. General instructions   If you have diabetes, work closely with your health care provider to keep your blood sugar under control.  If you have numbness in your feet: ? Check every day for signs of injury or infection. Watch for redness, warmth, and swelling. ? Wear padded socks and comfortable shoes. These help protect your feet.  Develop a good support system. Living with peripheral neuropathy can be stressful. Consider talking with a mental health specialist or joining a support group.  Use assistive devices and attend physical therapy as told by your health care provider. This may include using a walker or a cane.  Keep all follow-up visits as told by your health care provider. This is important. Contact a health care provider if:  You have new signs or symptoms of peripheral neuropathy.  You are struggling emotionally from dealing with peripheral neuropathy.  Your pain is not well-controlled. Get help right away if:  You have an injury or infection that is not healing normally.  You develop new weakness in an arm or leg.  You fall frequently. Summary  Peripheral neuropathy is when the nerves in the arms, or legs are damaged, resulting in numbness, weakness, or pain.  There are many causes of peripheral neuropathy, including diabetes, pinched nerves, vitamin deficiencies, autoimmune disease, and hereditary conditions.  Diagnosing and finding the cause of peripheral neuropathy can be difficult. Your health care provider will take your medical history, do a physical exam, and do tests, including blood tests and nerve function tests.  Treatment  involves treating the underlying cause of the neuropathy and taking medicines to help control pain. Physical therapy and assistive devices may also help. This information is not intended to replace advice given to you by your health care provider. Make sure you discuss any questions you have with your health care provider. Document Revised: 07/01/2017 Document Reviewed: 09/27/2016 Elsevier Patient Education  [place] are small areas of thickened skin that occur on the top, sides, or tip of a toe. They contain a cone-shaped core with a point that can press on a nerve below. This causes pain.  Calluses are areas of thickened skin that can occur anywhere on the body, including the hands, fingers, palms, soles of the feet, and heels. Calluses are usually larger than corns. What are the causes? Corns and calluses are caused by rubbing (friction) or pressure, such as from shoes that are too tight or do not fit properly. What increases the risk? Corns are more likely to develop in people who have misshapen toes (toe deformities), such as hammer toes. Calluses can occur with friction to any area of the skin. They are more likely to develop in people who:  Work with their hands.  Wear shoes that fit poorly, are too tight, or are high-heeled.  Have toe deformities. What are the signs or symptoms? Symptoms of a corn or callus include:  A  hard growth on the skin.  Pain or tenderness under the skin.  Redness and swelling.  Increased discomfort while wearing tight-fitting shoes, if your feet are affected. If a corn or callus becomes infected, symptoms may include:  Redness and swelling that gets worse.  Pain.  Fluid, blood, or pus draining from the corn or callus. How is this diagnosed? Corns and calluses may be diagnosed based on your symptoms, your medical history, and a physical exam. How is this treated? Treatment for corns and calluses may  include:  Removing the cause of the friction or pressure. This may involve: ? Changing your shoes. ? Wearing shoe inserts (orthotics) or other protective layers in your shoes, such as a corn pad. ? Wearing gloves.  Applying medicine to the skin (topical medicine) to help soften skin in the hardened, thickened areas.  Removing layers of dead skin with a file to reduce the size of the corn or callus.  Removing the corn or callus with a scalpel or laser.  Taking antibiotic medicines, if your corn or callus is infected.  Having surgery, if a toe deformity is the cause. Follow these instructions at home:   Take over-the-counter and prescription medicines only as told by your health care provider.  If you were prescribed an antibiotic, take it as told by your health care provider. Do not stop taking it even if your condition starts to improve.  Wear shoes that fit well. Avoid wearing high-heeled shoes and shoes that are too tight or too loose.  Wear any padding, protective layers, gloves, or orthotics as told by your health care provider.  Soak your hands or feet and then use a file or pumice stone to soften your corn or callus. Do this as told by your health care provider.  Check your corn or callus every day for symptoms of infection. Contact a health care provider if you:  Notice that your symptoms do not improve with treatment.  Have redness or swelling that gets worse.  Notice that your corn or callus becomes painful.  Have fluid, blood, or pus coming from your corn or callus.  Have new symptoms. Summary  Corns are small areas of thickened skin that occur on the top, sides, or tip of a toe.  Calluses are areas of thickened skin that can occur anywhere on the body, including the hands, fingers, palms, and soles of the feet. Calluses are usually larger than corns.  Corns and calluses are caused by rubbing (friction) or pressure, such as from shoes that are too tight or do  not fit properly.  Treatment may include wearing any padding, protective layers, gloves, or orthotics as told by your health care provider. This information is not intended to replace advice given to you by your health care provider. Make sure you discuss any questions you have with your health care provider. Document Revised: 11/08/2018 Document Reviewed: 06/01/2017 Elsevier Patient Education  2020 [employer].

## 2020-02-13 NOTE — Progress Notes (Signed)
Subjective: Robert Dickerson is a pleasant 79 y.o. male patient seen today painful callus(es) on plantar aspect of both feet. Pain has been present for the past few weeks. Aggravating factors include weightbearing with and without shoe gear. Pain is relieved with periodic professional debridement.   Patient states he does take gabapentin for neuropathy in his legs. He does relate numbness in feet at times as well. He denies any redness, swelling or drainage from any of his calluses.  Past Medical History:  Diagnosis Date  . Arthritis    all joints  . Chronic kidney disease, stage 3   . Congestive heart failure with LV diastolic dysfunction, NYHA class 2 ([place])   . GERD (gastroesophageal reflux disease)   . Hypertension   . Hypertensive chronic kidney disease   . Sleep apnea    uses cpap, pt does not know settings    Patient Active Problem List   Diagnosis Date Noted  . Impacted cerumen of left ear 03/10/2017  . Faintness 12/09/2015  . CHRONIC PANCREATITIS 06/16/2009    Current Outpatient Medications on File Prior to Visit  Medication Sig Dispense Refill  . allopurinol (ZYLOPRIM) 100 MG tablet Take 100 mg by mouth daily.    Marland Kitchen amitriptyline (ELAVIL) 10 MG tablet Take 10 mg by mouth at bedtime.     Marland Kitchen amLODipine (NORVASC) 10 MG tablet Take 10 mg by mouth daily.    Marland Kitchen azithromycin (ZITHROMAX) 250 MG tablet     . cetirizine (ZYRTEC) 10 MG tablet Take 5 mg by mouth at bedtime.     . colchicine 0.6 MG tablet Take 0.3 mg by mouth daily.    . colesevelam (WELCHOL) 625 MG tablet     . econazole nitrate 1 % cream Apply 1 application topically daily as needed (rash).     Marland Kitchen erythromycin with ethanol (EMGEL) 2 % gel     . febuxostat (ULORIC) 40 MG tablet Take 40 mg by mouth daily.    . furosemide (LASIX) 80 MG tablet     . gabapentin (NEURONTIN) 100 MG capsule     . ibuprofen (ADVIL,MOTRIN) 800 MG tablet TK 1 T PO TID FOR 14 DAYS PRF PAIN    . losartan (COZAAR) 100 MG tablet Take 100 mg by mouth  daily.    . metolazone (ZAROXOLYN) 5 MG tablet Take 5 mg by mouth daily.    . naproxen sodium (ALEVE) 220 MG tablet Take 220 mg by mouth.    Marland Kitchen omeprazole (PRILOSEC) 20 MG capsule Take by mouth.    . predniSONE (DELTASONE) 5 MG tablet     . tiZANidine (ZANAFLEX) 4 MG tablet TK 1 T PO Q 6 H FOR 7 DAYS    . triamcinolone cream (KENALOG) 0.5 % Apply 1 application topically daily as needed (rash).    Marland Kitchen VIRTUSSIN A/C 100-10 MG/5ML syrup TK 5 MLS PO Q 4 H PRF COUGH    . vitamin B-12 (CYANOCOBALAMIN) 1000 MCG tablet Take 1,000 mcg by mouth daily.     No current facility-administered medications on file prior to visit.    Allergies  Allergen Reactions  . Ace Inhibitors Cough and Other (See Comments)    Other reaction(s): Cough Other reaction(s): Cough Other reaction(s): Cough Other reaction(s): Cough Other reaction(s): Cough   . Lisinopril Cough    Other reaction(s): Cough  . Oxycodone     "out of it" "out of it" "out of it"    Objective: Physical Exam  General: Robert Dickerson is a  pleasant 79 y.o. Caucasian male, obese in NAD. AAO x 3.   Vascular:  Capillary refill time to digits immediate b/l. Palpable pedal pulses b/l LE. Pedal hair sparse. Lower extremity skin temperature gradient within normal limits. No pain with calf compression b/l. No edema noted b/l lower extremities.  Dermatological:  Pedal skin with normal turgor, texture and tone bilaterally. No open wounds bilaterally. No interdigital macerations bilaterally. Toenails 1-5 b/l well maintained with adequate length. No erythema, no edema, no drainage, no flocculence. Hyperkeratotic lesion(s) submet head 4 left foot and submet head 5 right foot.  No erythema, no edema, no drainage, no flocculence.  Musculoskeletal:  Normal muscle strength 5/5 to all lower extremity muscle groups bilaterally. No pain crepitus or joint limitation noted with ROM b/l. Patient ambulates independent of any assistive aids.  Neurological:   Protective sensation decreased with 10 gram monofilament b/l. Vibratory sensation diminished b/l. Proprioception intact bilaterally. Clonus negative b/l.  Assessment and Plan:  1. Callus   2. Loss of protective sensation of skin of foot   3. Idiopathic peripheral neuropathy    -Examined patient. -Callus(es) submet head 4 left foot and submet head 5 right foot pared utilizing sterile scalpel blade without complication or incident. Total number debrided =2. I gave him information on reusable horseshoe pads from Dr. Marlaine Hind to place around calluses in between visits. He has orthotics and I have asked him to bring those in to see if they can be modified to offload calluses. Dispensed information on neuropathy as well as calluses.  -Patient to report any pedal injuries to medical professional immediately. -Patient to continue soft, supportive shoe gear daily. -Patient/POA to call should there be question/concern in the interim. -Patient was appreciative of visit on today and he understands he is to keep follow up appointment with Dr. Prudence Davidson.  Marzetta Board, DPM

## 2020-02-21 DIAGNOSIS — I129 Hypertensive chronic kidney disease with stage 1 through stage 4 chronic kidney disease, or unspecified chronic kidney disease: Secondary | ICD-10-CM | POA: Diagnosis not present

## 2020-02-28 DIAGNOSIS — I129 Hypertensive chronic kidney disease with stage 1 through stage 4 chronic kidney disease, or unspecified chronic kidney disease: Secondary | ICD-10-CM | POA: Diagnosis not present

## 2020-02-29 DIAGNOSIS — I5030 Unspecified diastolic (congestive) heart failure: Secondary | ICD-10-CM | POA: Diagnosis not present

## 2020-02-29 DIAGNOSIS — M353 Polymyalgia rheumatica: Secondary | ICD-10-CM | POA: Diagnosis not present

## 2020-02-29 DIAGNOSIS — N184 Chronic kidney disease, stage 4 (severe): Secondary | ICD-10-CM | POA: Diagnosis not present

## 2020-02-29 DIAGNOSIS — R202 Paresthesia of skin: Secondary | ICD-10-CM | POA: Diagnosis not present

## 2020-02-29 DIAGNOSIS — Z1211 Encounter for screening for malignant neoplasm of colon: Secondary | ICD-10-CM | POA: Diagnosis not present

## 2020-02-29 DIAGNOSIS — I129 Hypertensive chronic kidney disease with stage 1 through stage 4 chronic kidney disease, or unspecified chronic kidney disease: Secondary | ICD-10-CM | POA: Diagnosis not present

## 2020-02-29 DIAGNOSIS — E1140 Type 2 diabetes mellitus with diabetic neuropathy, unspecified: Secondary | ICD-10-CM | POA: Diagnosis not present

## 2020-03-06 DIAGNOSIS — Z1211 Encounter for screening for malignant neoplasm of colon: Secondary | ICD-10-CM | POA: Diagnosis not present

## 2020-03-17 DIAGNOSIS — N184 Chronic kidney disease, stage 4 (severe): Secondary | ICD-10-CM | POA: Diagnosis not present

## 2020-03-19 DIAGNOSIS — Z20822 Contact with and (suspected) exposure to covid-19: Secondary | ICD-10-CM | POA: Diagnosis not present

## 2020-03-31 DIAGNOSIS — Z5181 Encounter for therapeutic drug level monitoring: Secondary | ICD-10-CM | POA: Diagnosis not present

## 2020-04-03 DIAGNOSIS — M7061 Trochanteric bursitis, right hip: Secondary | ICD-10-CM | POA: Diagnosis not present

## 2020-04-08 ENCOUNTER — Ambulatory Visit: Payer: PRIVATE HEALTH INSURANCE

## 2020-04-08 DIAGNOSIS — B078 Other viral warts: Secondary | ICD-10-CM | POA: Diagnosis not present

## 2020-04-08 DIAGNOSIS — L718 Other rosacea: Secondary | ICD-10-CM | POA: Diagnosis not present

## 2020-04-08 DIAGNOSIS — L728 Other follicular cysts of the skin and subcutaneous tissue: Secondary | ICD-10-CM | POA: Diagnosis not present

## 2020-04-08 DIAGNOSIS — Z08 Encounter for follow-up examination after completed treatment for malignant neoplasm: Secondary | ICD-10-CM | POA: Diagnosis not present

## 2020-04-08 DIAGNOSIS — Z85828 Personal history of other malignant neoplasm of skin: Secondary | ICD-10-CM | POA: Diagnosis not present

## 2020-04-09 ENCOUNTER — Ambulatory Visit: Payer: Medicare Other | Admitting: Podiatry

## 2020-04-15 ENCOUNTER — Ambulatory Visit: Payer: Medicare Other | Attending: Internal Medicine

## 2020-04-15 DIAGNOSIS — Z23 Encounter for immunization: Secondary | ICD-10-CM

## 2020-04-15 NOTE — Progress Notes (Signed)
   Covid-19 Vaccination Clinic  Name:  Robert Dickerson    MRN: [id number] DOB: 02-11-1941  04/15/2020  Robert Dickerson was observed post Covid-19 immunization for 15 minutes without incident. He was provided with Vaccine Information Sheet and instruction to access the V-Safe system.   Robert Dickerson was instructed to call 911 with any severe reactions post vaccine: Marland Kitchen Difficulty breathing  . Swelling of face and throat  . A fast heartbeat  . A bad rash all over body  . Dizziness and weakness

## 2020-04-17 DIAGNOSIS — M10372 Gout due to renal impairment, left ankle and foot: Secondary | ICD-10-CM | POA: Diagnosis not present

## 2020-04-17 DIAGNOSIS — I129 Hypertensive chronic kidney disease with stage 1 through stage 4 chronic kidney disease, or unspecified chronic kidney disease: Secondary | ICD-10-CM | POA: Diagnosis not present

## 2020-04-17 DIAGNOSIS — N184 Chronic kidney disease, stage 4 (severe): Secondary | ICD-10-CM | POA: Diagnosis not present

## 2020-04-17 DIAGNOSIS — N400 Enlarged prostate without lower urinary tract symptoms: Secondary | ICD-10-CM | POA: Diagnosis not present

## 2020-04-17 DIAGNOSIS — N183 Chronic kidney disease, stage 3 unspecified: Secondary | ICD-10-CM | POA: Diagnosis not present

## 2020-04-17 DIAGNOSIS — I5030 Unspecified diastolic (congestive) heart failure: Secondary | ICD-10-CM | POA: Diagnosis not present

## 2020-04-17 DIAGNOSIS — E1159 Type 2 diabetes mellitus with other circulatory complications: Secondary | ICD-10-CM | POA: Diagnosis not present

## 2020-04-17 DIAGNOSIS — M169 Osteoarthritis of hip, unspecified: Secondary | ICD-10-CM | POA: Diagnosis not present

## 2020-04-17 DIAGNOSIS — E1169 Type 2 diabetes mellitus with other specified complication: Secondary | ICD-10-CM | POA: Diagnosis not present

## 2020-04-17 DIAGNOSIS — M179 Osteoarthritis of knee, unspecified: Secondary | ICD-10-CM | POA: Diagnosis not present

## 2020-04-17 DIAGNOSIS — E1140 Type 2 diabetes mellitus with diabetic neuropathy, unspecified: Secondary | ICD-10-CM | POA: Diagnosis not present

## 2020-04-21 DIAGNOSIS — Z23 Encounter for immunization: Secondary | ICD-10-CM | POA: Diagnosis not present

## 2020-05-01 DIAGNOSIS — I129 Hypertensive chronic kidney disease with stage 1 through stage 4 chronic kidney disease, or unspecified chronic kidney disease: Secondary | ICD-10-CM | POA: Diagnosis not present

## 2020-05-22 IMAGING — US US RENAL
1 series · 14 of 25 positions shown · non-contrast
Comparison: None.

CLINICAL DATA: Initial evaluation for chronic kidney disease, stage
IV.

EXAM:
RENAL / URINARY TRACT ULTRASOUND COMPLETE

[Series 1: us renal · 0.25mm/px · 14 of 34 slices shown]
[im 1/34]
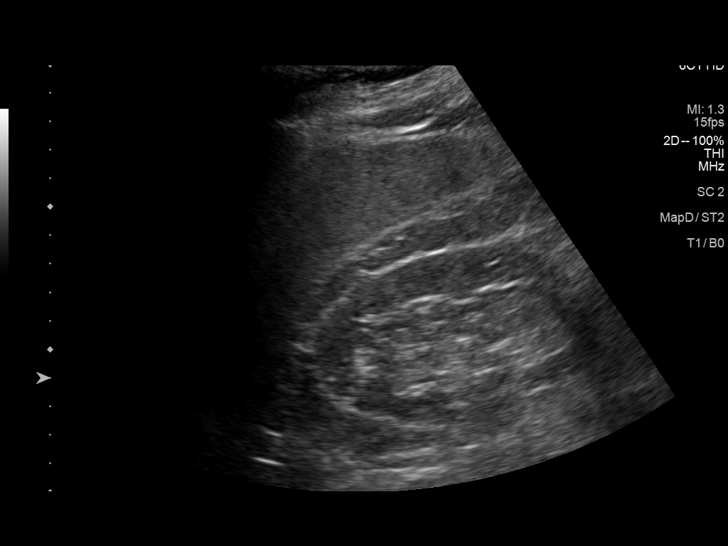
[im 3/34]
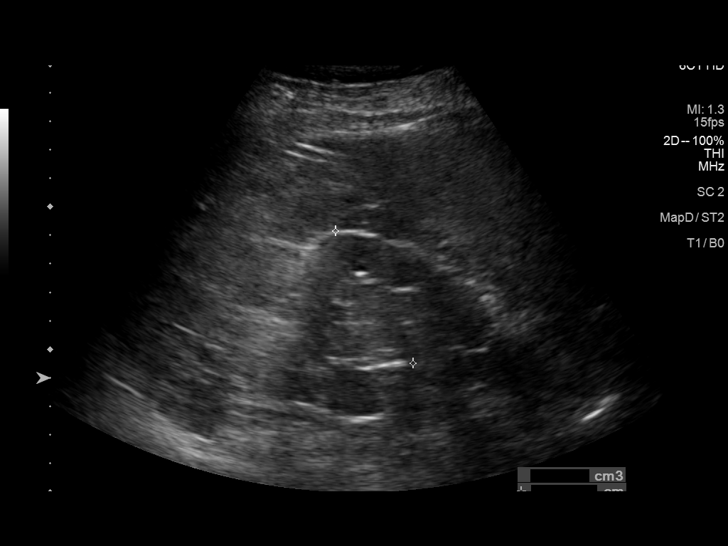
[im 6/34]
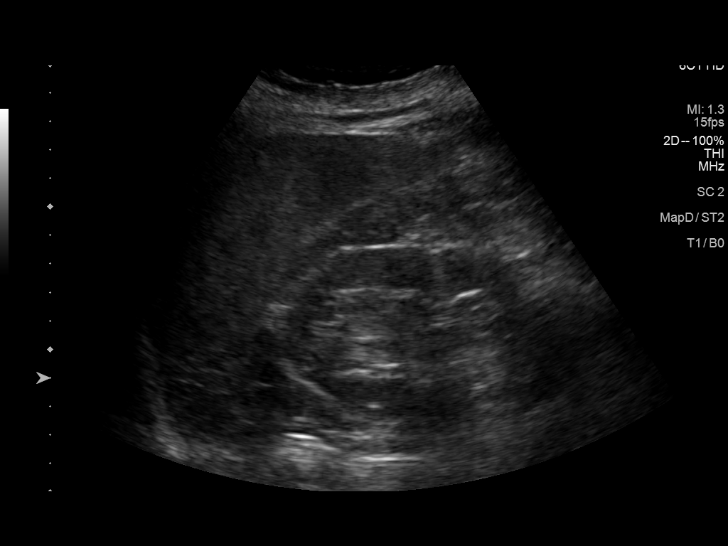
[im 9/34]
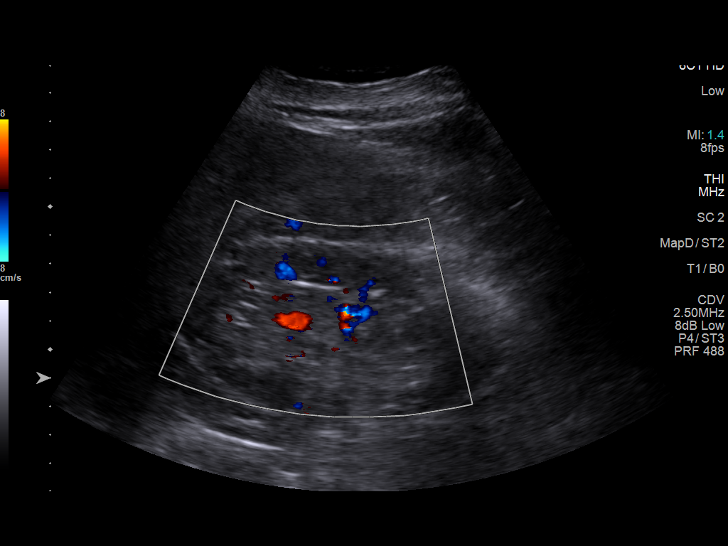
[im 12/34]
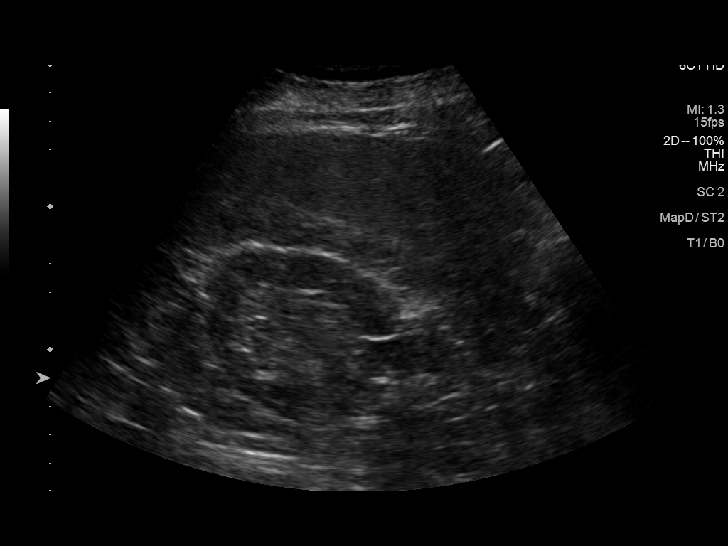
[im 13/34]
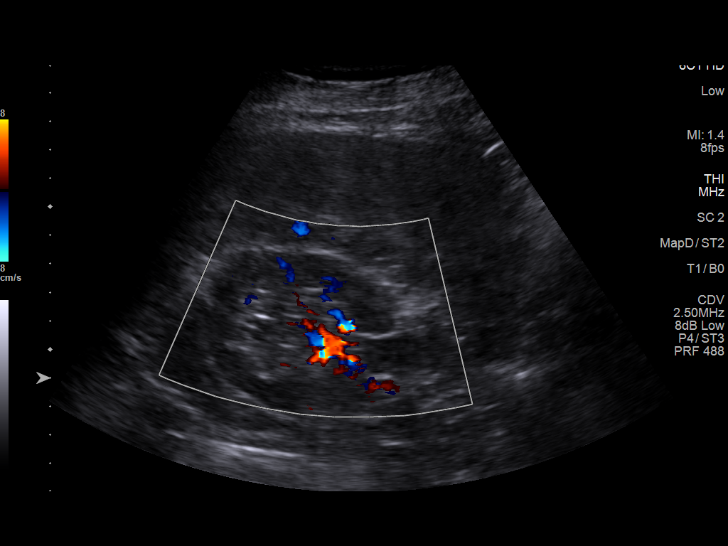
[im 16/34]
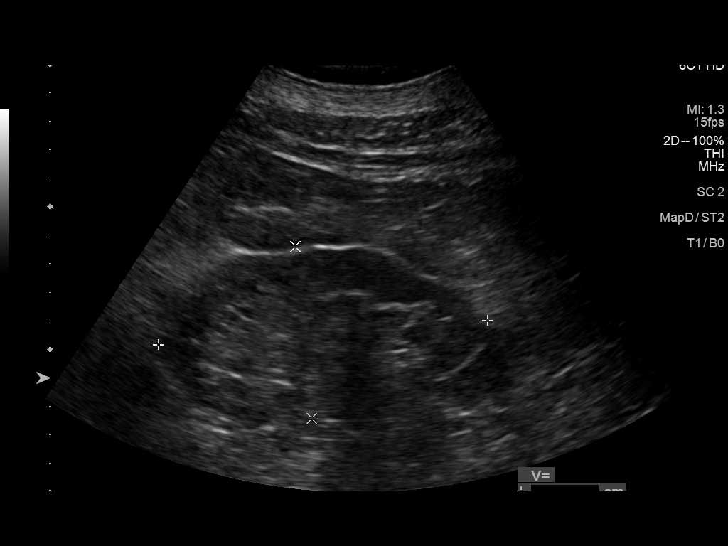
[im 18/34]
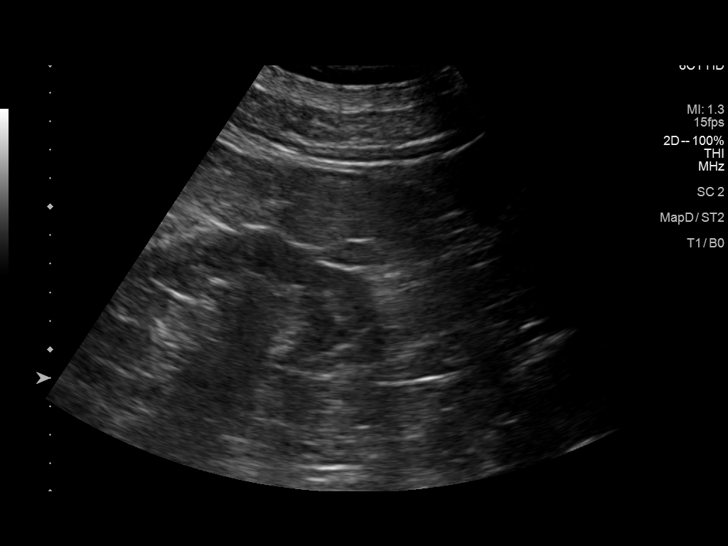
[im 21/34]
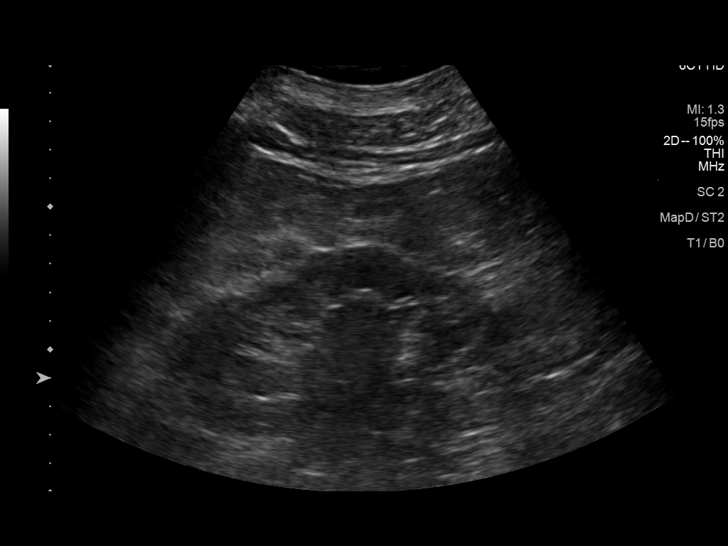
[im 23/34]
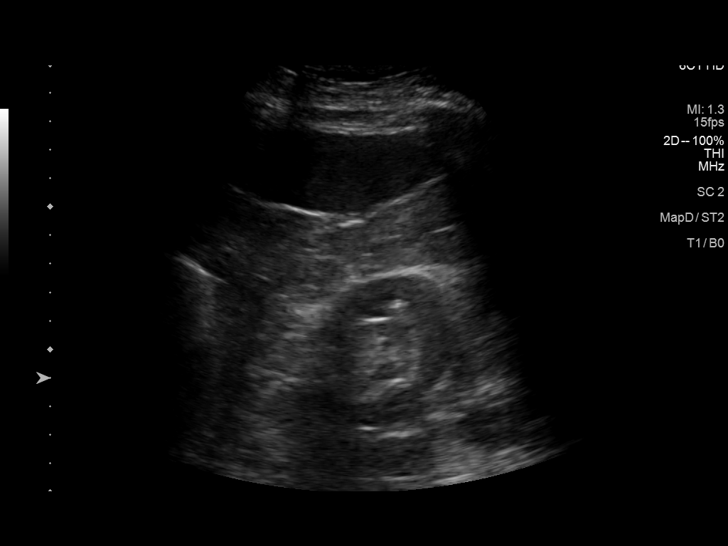
[im 25/34]
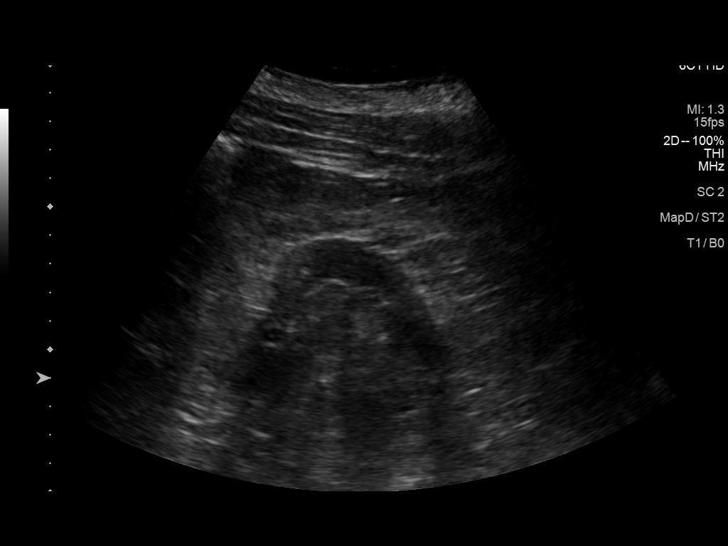
[im 28/34]
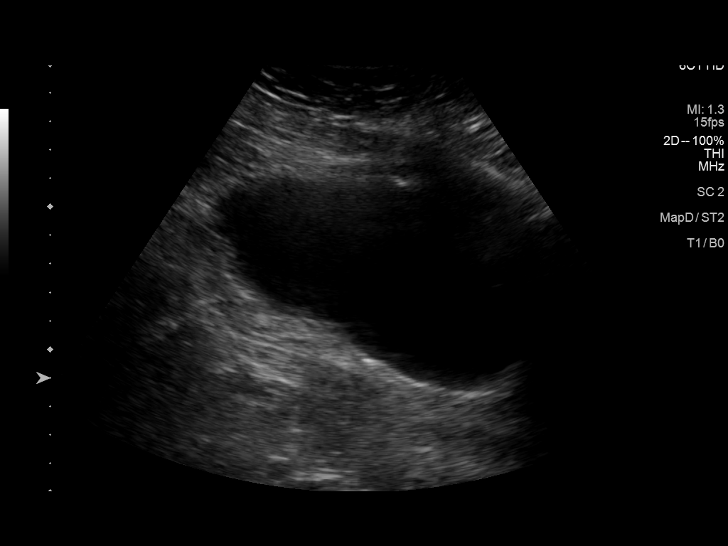
[im 31/34]
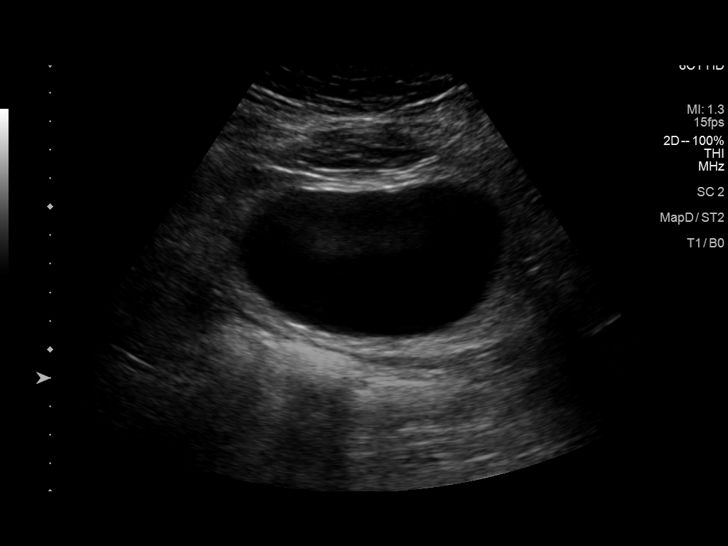
[im 34/34]
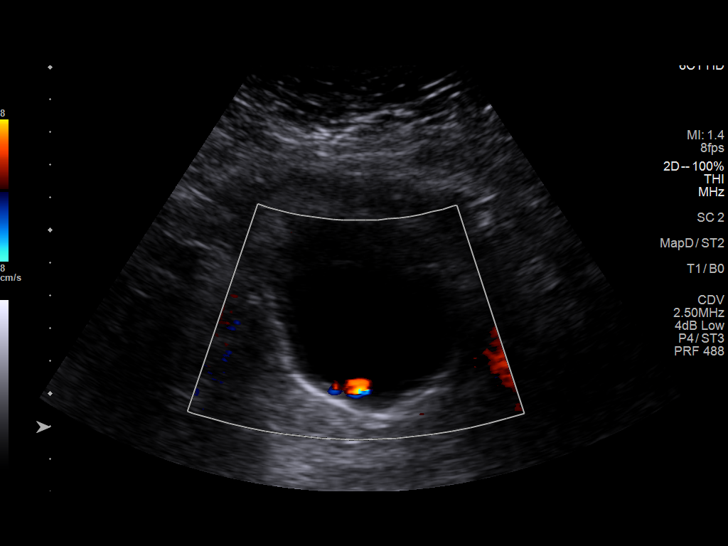

[14 of 25 positions shown; findings below may reference images not displayed]

FINDINGS: Right Kidney:

Renal measurements: 12.0 x 6.2 x 5.4 cm = volume: 207 mL.
Echogenicity within normal limits. Mild diffuse cortical thinning.
No mass or hydronephrosis visualized. Small extrarenal pelvis noted.

Left Kidney:

Renal measurements: 11.6 x 6.0 x 6.2 cm = volume: 225 mL.
Echogenicity within normal limits. Mild diffuse cortical thinning.
No mass or hydronephrosis visualized.

Bladder:

Appears normal for degree of bladder distention.

Other:

None.
IMPRESSION: 1. Mild diffuse chronic cortical thinning about the kidneys
bilaterally.
2. Otherwise unremarkable renal ultrasound.

## 2020-05-28 ENCOUNTER — Other Ambulatory Visit: Payer: Self-pay

## 2020-05-28 ENCOUNTER — Ambulatory Visit (INDEPENDENT_AMBULATORY_CARE_PROVIDER_SITE_OTHER): Payer: Medicare Other | Admitting: Podiatry

## 2020-05-28 ENCOUNTER — Encounter: Payer: Self-pay | Admitting: Podiatry

## 2020-05-28 DIAGNOSIS — M79674 Pain in right toe(s): Secondary | ICD-10-CM

## 2020-05-28 DIAGNOSIS — M79675 Pain in left toe(s): Secondary | ICD-10-CM

## 2020-05-28 DIAGNOSIS — B351 Tinea unguium: Secondary | ICD-10-CM | POA: Diagnosis not present

## 2020-05-28 DIAGNOSIS — G609 Hereditary and idiopathic neuropathy, unspecified: Secondary | ICD-10-CM

## 2020-05-28 NOTE — Progress Notes (Signed)
This patient returns to the office for evaluation and treatment of long thick painful nails .  This patient is unable to trim his own nails since the patient cannot reach his feet.  Patient says the nails are painful walking and wearing his shoes.  He returns for preventive foot care services.  General Appearance  Alert, conversant and in no acute stress.  Vascular  Dorsalis pedis and posterior tibial  pulses are palpable  bilaterally.  Capillary return is within normal limits  bilaterally. Temperature is within normal limits  bilaterally.  Neurologic  Senn-Weinstein monofilament wire test diminished  bilaterally. Muscle power within normal limits bilaterally.  Nails Thick disfigured discolored nails with subungual debris  from hallux to second  toes bilaterally. No evidence of bacterial infection or drainage bilaterally.  Orthopedic  No limitations of motion  feet .  No crepitus or effusions noted.  No bony pathology or digital deformities noted.  Skin  normotropic skin with no porokeratosis noted bilaterally.  No signs of infections or ulcers noted.     Onychomycosis  Pain in toes right foot  Pain in toes left foot  Debridement  of nails  1-5  B/L with a nail nipper.  Nails were then filed using a dremel tool with no incidents.  Prescribed spenco 3/4 orthoses for foot function. RTC 3 months    Gardiner Barefoot DPM

## 2020-05-30 DIAGNOSIS — I129 Hypertensive chronic kidney disease with stage 1 through stage 4 chronic kidney disease, or unspecified chronic kidney disease: Secondary | ICD-10-CM | POA: Diagnosis not present

## 2020-06-16 DIAGNOSIS — M25552 Pain in left hip: Secondary | ICD-10-CM | POA: Diagnosis not present

## 2020-06-16 DIAGNOSIS — M170 Bilateral primary osteoarthritis of knee: Secondary | ICD-10-CM | POA: Diagnosis not present

## 2020-06-16 DIAGNOSIS — R0781 Pleurodynia: Secondary | ICD-10-CM | POA: Diagnosis not present

## 2020-06-23 DIAGNOSIS — M170 Bilateral primary osteoarthritis of knee: Secondary | ICD-10-CM | POA: Diagnosis not present

## 2020-06-30 DIAGNOSIS — M25512 Pain in left shoulder: Secondary | ICD-10-CM | POA: Diagnosis not present

## 2020-06-30 DIAGNOSIS — I129 Hypertensive chronic kidney disease with stage 1 through stage 4 chronic kidney disease, or unspecified chronic kidney disease: Secondary | ICD-10-CM | POA: Diagnosis not present

## 2020-06-30 DIAGNOSIS — M170 Bilateral primary osteoarthritis of knee: Secondary | ICD-10-CM | POA: Diagnosis not present

## 2020-07-01 DIAGNOSIS — N400 Enlarged prostate without lower urinary tract symptoms: Secondary | ICD-10-CM | POA: Diagnosis not present

## 2020-07-01 DIAGNOSIS — M10372 Gout due to renal impairment, left ankle and foot: Secondary | ICD-10-CM | POA: Diagnosis not present

## 2020-07-01 DIAGNOSIS — E0859 Diabetes mellitus due to underlying condition with other circulatory complications: Secondary | ICD-10-CM | POA: Diagnosis not present

## 2020-07-01 DIAGNOSIS — E0869 Diabetes mellitus due to underlying condition with other specified complication: Secondary | ICD-10-CM | POA: Diagnosis not present

## 2020-07-01 DIAGNOSIS — M179 Osteoarthritis of knee, unspecified: Secondary | ICD-10-CM | POA: Diagnosis not present

## 2020-07-01 DIAGNOSIS — E1140 Type 2 diabetes mellitus with diabetic neuropathy, unspecified: Secondary | ICD-10-CM | POA: Diagnosis not present

## 2020-07-01 DIAGNOSIS — I129 Hypertensive chronic kidney disease with stage 1 through stage 4 chronic kidney disease, or unspecified chronic kidney disease: Secondary | ICD-10-CM | POA: Diagnosis not present

## 2020-07-01 DIAGNOSIS — N184 Chronic kidney disease, stage 4 (severe): Secondary | ICD-10-CM | POA: Diagnosis not present

## 2020-07-01 DIAGNOSIS — I5030 Unspecified diastolic (congestive) heart failure: Secondary | ICD-10-CM | POA: Diagnosis not present

## 2020-07-01 DIAGNOSIS — N183 Chronic kidney disease, stage 3 unspecified: Secondary | ICD-10-CM | POA: Diagnosis not present

## 2020-07-01 DIAGNOSIS — K219 Gastro-esophageal reflux disease without esophagitis: Secondary | ICD-10-CM | POA: Diagnosis not present

## 2020-07-01 DIAGNOSIS — M169 Osteoarthritis of hip, unspecified: Secondary | ICD-10-CM | POA: Diagnosis not present

## 2020-07-07 DIAGNOSIS — H26493 Other secondary cataract, bilateral: Secondary | ICD-10-CM | POA: Diagnosis not present

## 2020-07-07 DIAGNOSIS — R7303 Prediabetes: Secondary | ICD-10-CM | POA: Diagnosis not present

## 2020-07-31 DIAGNOSIS — I10 Essential (primary) hypertension: Secondary | ICD-10-CM | POA: Diagnosis not present

## 2020-08-04 DIAGNOSIS — Z20822 Contact with and (suspected) exposure to covid-19: Secondary | ICD-10-CM | POA: Diagnosis not present

## 2020-08-11 DIAGNOSIS — H9193 Unspecified hearing loss, bilateral: Secondary | ICD-10-CM | POA: Insufficient documentation

## 2020-08-15 DIAGNOSIS — G4733 Obstructive sleep apnea (adult) (pediatric): Secondary | ICD-10-CM | POA: Diagnosis not present

## 2020-08-15 DIAGNOSIS — R202 Paresthesia of skin: Secondary | ICD-10-CM | POA: Diagnosis not present

## 2020-08-15 DIAGNOSIS — I129 Hypertensive chronic kidney disease with stage 1 through stage 4 chronic kidney disease, or unspecified chronic kidney disease: Secondary | ICD-10-CM | POA: Diagnosis not present

## 2020-08-15 DIAGNOSIS — M353 Polymyalgia rheumatica: Secondary | ICD-10-CM | POA: Diagnosis not present

## 2020-08-15 DIAGNOSIS — Z1389 Encounter for screening for other disorder: Secondary | ICD-10-CM | POA: Diagnosis not present

## 2020-08-15 DIAGNOSIS — N184 Chronic kidney disease, stage 4 (severe): Secondary | ICD-10-CM | POA: Diagnosis not present

## 2020-08-15 DIAGNOSIS — Z0000 Encounter for general adult medical examination without abnormal findings: Secondary | ICD-10-CM | POA: Diagnosis not present

## 2020-08-15 DIAGNOSIS — I5030 Unspecified diastolic (congestive) heart failure: Secondary | ICD-10-CM | POA: Diagnosis not present

## 2020-08-15 DIAGNOSIS — Z23 Encounter for immunization: Secondary | ICD-10-CM | POA: Diagnosis not present

## 2020-08-15 DIAGNOSIS — E1140 Type 2 diabetes mellitus with diabetic neuropathy, unspecified: Secondary | ICD-10-CM | POA: Diagnosis not present

## 2020-08-15 DIAGNOSIS — M1A00X0 Idiopathic chronic gout, unspecified site, without tophus (tophi): Secondary | ICD-10-CM | POA: Diagnosis not present

## 2020-08-19 DIAGNOSIS — E0869 Diabetes mellitus due to underlying condition with other specified complication: Secondary | ICD-10-CM | POA: Diagnosis not present

## 2020-08-19 DIAGNOSIS — E1140 Type 2 diabetes mellitus with diabetic neuropathy, unspecified: Secondary | ICD-10-CM | POA: Diagnosis not present

## 2020-08-19 DIAGNOSIS — N184 Chronic kidney disease, stage 4 (severe): Secondary | ICD-10-CM | POA: Diagnosis not present

## 2020-08-19 DIAGNOSIS — I129 Hypertensive chronic kidney disease with stage 1 through stage 4 chronic kidney disease, or unspecified chronic kidney disease: Secondary | ICD-10-CM | POA: Diagnosis not present

## 2020-08-19 DIAGNOSIS — M179 Osteoarthritis of knee, unspecified: Secondary | ICD-10-CM | POA: Diagnosis not present

## 2020-08-19 DIAGNOSIS — I10 Essential (primary) hypertension: Secondary | ICD-10-CM | POA: Diagnosis not present

## 2020-08-19 DIAGNOSIS — M169 Osteoarthritis of hip, unspecified: Secondary | ICD-10-CM | POA: Diagnosis not present

## 2020-08-19 DIAGNOSIS — M10372 Gout due to renal impairment, left ankle and foot: Secondary | ICD-10-CM | POA: Diagnosis not present

## 2020-08-19 DIAGNOSIS — I5030 Unspecified diastolic (congestive) heart failure: Secondary | ICD-10-CM | POA: Diagnosis not present

## 2020-08-19 DIAGNOSIS — K219 Gastro-esophageal reflux disease without esophagitis: Secondary | ICD-10-CM | POA: Diagnosis not present

## 2020-08-19 DIAGNOSIS — N183 Chronic kidney disease, stage 3 unspecified: Secondary | ICD-10-CM | POA: Diagnosis not present

## 2020-09-01 DIAGNOSIS — I129 Hypertensive chronic kidney disease with stage 1 through stage 4 chronic kidney disease, or unspecified chronic kidney disease: Secondary | ICD-10-CM | POA: Diagnosis not present

## 2020-09-03 ENCOUNTER — Ambulatory Visit (INDEPENDENT_AMBULATORY_CARE_PROVIDER_SITE_OTHER): Payer: Medicare Other

## 2020-09-03 ENCOUNTER — Encounter: Payer: Self-pay | Admitting: Podiatry

## 2020-09-03 ENCOUNTER — Other Ambulatory Visit: Payer: Self-pay

## 2020-09-03 ENCOUNTER — Other Ambulatory Visit: Payer: Self-pay | Admitting: Podiatry

## 2020-09-03 ENCOUNTER — Ambulatory Visit (INDEPENDENT_AMBULATORY_CARE_PROVIDER_SITE_OTHER): Payer: Medicare Other | Admitting: Podiatry

## 2020-09-03 DIAGNOSIS — M79674 Pain in right toe(s): Secondary | ICD-10-CM

## 2020-09-03 DIAGNOSIS — M19071 Primary osteoarthritis, right ankle and foot: Secondary | ICD-10-CM

## 2020-09-03 DIAGNOSIS — M779 Enthesopathy, unspecified: Secondary | ICD-10-CM | POA: Insufficient documentation

## 2020-09-03 DIAGNOSIS — M1A0710 Idiopathic chronic gout, right ankle and foot, without tophus (tophi): Secondary | ICD-10-CM

## 2020-09-03 DIAGNOSIS — M19079 Primary osteoarthritis, unspecified ankle and foot: Secondary | ICD-10-CM

## 2020-09-03 DIAGNOSIS — G609 Hereditary and idiopathic neuropathy, unspecified: Secondary | ICD-10-CM

## 2020-09-03 DIAGNOSIS — N184 Chronic kidney disease, stage 4 (severe): Secondary | ICD-10-CM | POA: Diagnosis not present

## 2020-09-03 DIAGNOSIS — M129 Arthropathy, unspecified: Secondary | ICD-10-CM | POA: Insufficient documentation

## 2020-09-03 DIAGNOSIS — M79675 Pain in left toe(s): Secondary | ICD-10-CM

## 2020-09-03 DIAGNOSIS — M7061 Trochanteric bursitis, right hip: Secondary | ICD-10-CM | POA: Diagnosis not present

## 2020-09-03 NOTE — Progress Notes (Signed)
This patient returns to the office for evaluation and treatment of long thick painful nails .  This patient is unable to trim his own nails since the patient cannot reach his feet.  Patient says the nails are painful walking and wearing his shoes.  He returns for preventive foot care services. He also says he has been having pain move around in his right foot.  He assumes it is gout and he takes colchicine for the pain.  He says the pain is present when he walks. No history of trauma.   He says he is having hip pain and has an appointment today with Dr.  Vella Raring today.     General Appearance  Alert, conversant and in no acute stress.  Vascular  Dorsalis pedis and posterior tibial  pulses are palpable  bilaterally.  Capillary return is within normal limits  bilaterally. Temperature is within normal limits  bilaterally.  Neurologic  Senn-Weinstein monofilament wire test diminished  bilaterally. Muscle power within normal limits bilaterally.  Nails Thick disfigured discolored nails with subungual debris  from hallux to second  toes bilaterally. No evidence of bacterial infection or drainage bilaterally.  Orthopedic  No limitations of motion  feet .  No crepitus or effusions noted.  No bony pathology or digital deformities noted.  Bony prominence at the outside dorsal aspect midfoot at the cuboid-metatarsal region right foot.  Patient has increased temperature in this area with swelling noted.  Palpable pain at the insertion peroneal brevis and higher above the insertion at Cuboid-fifth metatarsal base.   Skin  normotropic skin with no porokeratosis noted bilaterally.  No signs of infections or ulcers noted.     Onychomycosis  Pain in toes right foot  Pain in toes left foot  Midfoot arthritis right foot   Tendinitis peroneal brevis right foot.    Debridement  of nails  1-5  B/L with a nail nipper.  Nails were then filed using a dremel tool with no incidents.  Patient had x-rays taken for evaluation of  right foot.  Arthritic changes consistant with lateral Liz-Frank joint right foot. Discussed this condition with this patient.   Due to the fact he is scheduled for hip evaluation today , I provided no treatment so that his doctor this afternoon could evaluate his foot since I believe his foot pain is related to his hip pain.  If he needs further treatment he needs to be worked up by the medical side of the clinic.   RTC 3 months for foot care.   Gardiner Barefoot DPM

## 2020-09-12 DIAGNOSIS — E1122 Type 2 diabetes mellitus with diabetic chronic kidney disease: Secondary | ICD-10-CM | POA: Diagnosis not present

## 2020-09-12 DIAGNOSIS — I129 Hypertensive chronic kidney disease with stage 1 through stage 4 chronic kidney disease, or unspecified chronic kidney disease: Secondary | ICD-10-CM | POA: Diagnosis not present

## 2020-09-12 DIAGNOSIS — I509 Heart failure, unspecified: Secondary | ICD-10-CM | POA: Diagnosis not present

## 2020-09-12 DIAGNOSIS — N184 Chronic kidney disease, stage 4 (severe): Secondary | ICD-10-CM | POA: Diagnosis not present

## 2020-09-18 DIAGNOSIS — M1A00X0 Idiopathic chronic gout, unspecified site, without tophus (tophi): Secondary | ICD-10-CM | POA: Diagnosis not present

## 2020-09-18 DIAGNOSIS — R202 Paresthesia of skin: Secondary | ICD-10-CM | POA: Diagnosis not present

## 2020-09-18 DIAGNOSIS — S51812A Laceration without foreign body of left forearm, initial encounter: Secondary | ICD-10-CM | POA: Diagnosis not present

## 2020-09-18 DIAGNOSIS — S51811A Laceration without foreign body of right forearm, initial encounter: Secondary | ICD-10-CM | POA: Diagnosis not present

## 2020-09-18 DIAGNOSIS — W19XXXA Unspecified fall, initial encounter: Secondary | ICD-10-CM | POA: Diagnosis not present

## 2020-09-29 DIAGNOSIS — I10 Essential (primary) hypertension: Secondary | ICD-10-CM | POA: Diagnosis not present

## 2020-10-06 DIAGNOSIS — M7061 Trochanteric bursitis, right hip: Secondary | ICD-10-CM | POA: Diagnosis not present

## 2020-10-06 DIAGNOSIS — M545 Low back pain, unspecified: Secondary | ICD-10-CM | POA: Diagnosis not present

## 2020-10-06 DIAGNOSIS — M1711 Unilateral primary osteoarthritis, right knee: Secondary | ICD-10-CM | POA: Diagnosis not present

## 2020-10-13 ENCOUNTER — Other Ambulatory Visit: Payer: Self-pay | Admitting: Orthopedic Surgery

## 2020-10-13 DIAGNOSIS — M25551 Pain in right hip: Secondary | ICD-10-CM

## 2020-10-21 DIAGNOSIS — N400 Enlarged prostate without lower urinary tract symptoms: Secondary | ICD-10-CM | POA: Diagnosis not present

## 2020-10-21 DIAGNOSIS — E1140 Type 2 diabetes mellitus with diabetic neuropathy, unspecified: Secondary | ICD-10-CM | POA: Diagnosis not present

## 2020-10-21 DIAGNOSIS — I5030 Unspecified diastolic (congestive) heart failure: Secondary | ICD-10-CM | POA: Diagnosis not present

## 2020-10-21 DIAGNOSIS — M179 Osteoarthritis of knee, unspecified: Secondary | ICD-10-CM | POA: Diagnosis not present

## 2020-10-21 DIAGNOSIS — I10 Essential (primary) hypertension: Secondary | ICD-10-CM | POA: Diagnosis not present

## 2020-10-21 DIAGNOSIS — K219 Gastro-esophageal reflux disease without esophagitis: Secondary | ICD-10-CM | POA: Diagnosis not present

## 2020-10-21 DIAGNOSIS — M10372 Gout due to renal impairment, left ankle and foot: Secondary | ICD-10-CM | POA: Diagnosis not present

## 2020-10-21 DIAGNOSIS — I129 Hypertensive chronic kidney disease with stage 1 through stage 4 chronic kidney disease, or unspecified chronic kidney disease: Secondary | ICD-10-CM | POA: Diagnosis not present

## 2020-10-25 ENCOUNTER — Ambulatory Visit
Admission: RE | Admit: 2020-10-25 | Discharge: 2020-10-25 | Disposition: A | Discharge status: Home / Self Care | Payer: Medicare Other | Source: Ambulatory Visit | Attending: Orthopedic Surgery | Admitting: Orthopedic Surgery

## 2020-10-25 ENCOUNTER — Other Ambulatory Visit: Payer: Self-pay

## 2020-10-25 DIAGNOSIS — M25551 Pain in right hip: Secondary | ICD-10-CM

## 2020-10-28 ENCOUNTER — Other Ambulatory Visit: Payer: Self-pay | Admitting: Orthopedic Surgery

## 2020-10-28 DIAGNOSIS — M545 Low back pain, unspecified: Secondary | ICD-10-CM | POA: Diagnosis not present

## 2020-10-30 DIAGNOSIS — I10 Essential (primary) hypertension: Secondary | ICD-10-CM | POA: Diagnosis not present

## 2020-11-15 ENCOUNTER — Other Ambulatory Visit: Payer: Self-pay

## 2020-11-15 ENCOUNTER — Ambulatory Visit
Admission: RE | Admit: 2020-11-15 | Discharge: 2020-11-15 | Disposition: A | Discharge status: Home / Self Care | Payer: Medicare Other | Source: Ambulatory Visit | Attending: Orthopedic Surgery | Admitting: Orthopedic Surgery

## 2020-11-15 DIAGNOSIS — M48061 Spinal stenosis, lumbar region without neurogenic claudication: Secondary | ICD-10-CM | POA: Diagnosis not present

## 2020-11-15 DIAGNOSIS — M545 Low back pain, unspecified: Secondary | ICD-10-CM

## 2020-11-18 DIAGNOSIS — M545 Low back pain, unspecified: Secondary | ICD-10-CM | POA: Diagnosis not present

## 2020-11-18 DIAGNOSIS — M48061 Spinal stenosis, lumbar region without neurogenic claudication: Secondary | ICD-10-CM | POA: Diagnosis not present

## 2020-11-21 ENCOUNTER — Other Ambulatory Visit: Payer: Self-pay | Admitting: Neurosurgery

## 2020-11-21 DIAGNOSIS — Z6829 Body mass index (BMI) 29.0-29.9, adult: Secondary | ICD-10-CM | POA: Insufficient documentation

## 2020-11-21 DIAGNOSIS — M48061 Spinal stenosis, lumbar region without neurogenic claudication: Secondary | ICD-10-CM

## 2020-11-28 ENCOUNTER — Other Ambulatory Visit: Payer: Self-pay | Admitting: Neurosurgery

## 2020-11-28 DIAGNOSIS — I10 Essential (primary) hypertension: Secondary | ICD-10-CM | POA: Diagnosis not present

## 2020-11-28 DIAGNOSIS — M4805 Spinal stenosis, thoracolumbar region: Secondary | ICD-10-CM

## 2020-11-29 ENCOUNTER — Other Ambulatory Visit: Payer: Self-pay

## 2020-11-29 ENCOUNTER — Ambulatory Visit
Admission: RE | Admit: 2020-11-29 | Discharge: 2020-11-29 | Disposition: A | Discharge status: Home / Self Care | Payer: Medicare Other | Source: Ambulatory Visit | Attending: Neurosurgery | Admitting: Neurosurgery

## 2020-11-29 DIAGNOSIS — M47814 Spondylosis without myelopathy or radiculopathy, thoracic region: Secondary | ICD-10-CM | POA: Diagnosis not present

## 2020-11-29 DIAGNOSIS — M545 Low back pain, unspecified: Secondary | ICD-10-CM | POA: Diagnosis not present

## 2020-11-29 DIAGNOSIS — M4805 Spinal stenosis, thoracolumbar region: Secondary | ICD-10-CM

## 2020-11-29 DIAGNOSIS — M48061 Spinal stenosis, lumbar region without neurogenic claudication: Secondary | ICD-10-CM

## 2020-12-01 DIAGNOSIS — R262 Difficulty in walking, not elsewhere classified: Secondary | ICD-10-CM | POA: Diagnosis not present

## 2020-12-01 DIAGNOSIS — M48061 Spinal stenosis, lumbar region without neurogenic claudication: Secondary | ICD-10-CM | POA: Diagnosis not present

## 2020-12-01 DIAGNOSIS — M6281 Muscle weakness (generalized): Secondary | ICD-10-CM | POA: Diagnosis not present

## 2020-12-03 ENCOUNTER — Other Ambulatory Visit: Payer: Self-pay

## 2020-12-03 ENCOUNTER — Ambulatory Visit (INDEPENDENT_AMBULATORY_CARE_PROVIDER_SITE_OTHER): Payer: Medicare Other | Admitting: Podiatry

## 2020-12-03 ENCOUNTER — Encounter: Payer: Self-pay | Admitting: Podiatry

## 2020-12-03 DIAGNOSIS — M353 Polymyalgia rheumatica: Secondary | ICD-10-CM | POA: Insufficient documentation

## 2020-12-03 DIAGNOSIS — M48061 Spinal stenosis, lumbar region without neurogenic claudication: Secondary | ICD-10-CM | POA: Diagnosis not present

## 2020-12-03 DIAGNOSIS — N5201 Erectile dysfunction due to arterial insufficiency: Secondary | ICD-10-CM | POA: Insufficient documentation

## 2020-12-03 DIAGNOSIS — M79675 Pain in left toe(s): Secondary | ICD-10-CM | POA: Diagnosis not present

## 2020-12-03 DIAGNOSIS — G609 Hereditary and idiopathic neuropathy, unspecified: Secondary | ICD-10-CM

## 2020-12-03 DIAGNOSIS — M179 Osteoarthritis of knee, unspecified: Secondary | ICD-10-CM | POA: Insufficient documentation

## 2020-12-03 DIAGNOSIS — R7301 Impaired fasting glucose: Secondary | ICD-10-CM | POA: Insufficient documentation

## 2020-12-03 DIAGNOSIS — M79674 Pain in right toe(s): Secondary | ICD-10-CM | POA: Diagnosis not present

## 2020-12-03 DIAGNOSIS — E119 Type 2 diabetes mellitus without complications: Secondary | ICD-10-CM | POA: Insufficient documentation

## 2020-12-03 DIAGNOSIS — M6281 Muscle weakness (generalized): Secondary | ICD-10-CM | POA: Diagnosis not present

## 2020-12-03 DIAGNOSIS — M5136 Other intervertebral disc degeneration, lumbar region: Secondary | ICD-10-CM | POA: Insufficient documentation

## 2020-12-03 DIAGNOSIS — E1142 Type 2 diabetes mellitus with diabetic polyneuropathy: Secondary | ICD-10-CM | POA: Diagnosis not present

## 2020-12-03 DIAGNOSIS — N400 Enlarged prostate without lower urinary tract symptoms: Secondary | ICD-10-CM | POA: Insufficient documentation

## 2020-12-03 DIAGNOSIS — G4733 Obstructive sleep apnea (adult) (pediatric): Secondary | ICD-10-CM | POA: Insufficient documentation

## 2020-12-03 DIAGNOSIS — M1710 Unilateral primary osteoarthritis, unspecified knee: Secondary | ICD-10-CM | POA: Insufficient documentation

## 2020-12-03 DIAGNOSIS — K219 Gastro-esophageal reflux disease without esophagitis: Secondary | ICD-10-CM | POA: Insufficient documentation

## 2020-12-03 DIAGNOSIS — I5030 Unspecified diastolic (congestive) heart failure: Secondary | ICD-10-CM | POA: Insufficient documentation

## 2020-12-03 DIAGNOSIS — I129 Hypertensive chronic kidney disease with stage 1 through stage 4 chronic kidney disease, or unspecified chronic kidney disease: Secondary | ICD-10-CM | POA: Insufficient documentation

## 2020-12-03 DIAGNOSIS — E1122 Type 2 diabetes mellitus with diabetic chronic kidney disease: Secondary | ICD-10-CM | POA: Insufficient documentation

## 2020-12-03 DIAGNOSIS — R319 Hematuria, unspecified: Secondary | ICD-10-CM | POA: Insufficient documentation

## 2020-12-03 DIAGNOSIS — B351 Tinea unguium: Secondary | ICD-10-CM

## 2020-12-03 DIAGNOSIS — R209 Unspecified disturbances of skin sensation: Secondary | ICD-10-CM | POA: Insufficient documentation

## 2020-12-03 DIAGNOSIS — M1A00X0 Idiopathic chronic gout, unspecified site, without tophus (tophi): Secondary | ICD-10-CM | POA: Insufficient documentation

## 2020-12-03 DIAGNOSIS — I10 Essential (primary) hypertension: Secondary | ICD-10-CM | POA: Insufficient documentation

## 2020-12-03 DIAGNOSIS — D1330 Benign neoplasm of unspecified part of small intestine: Secondary | ICD-10-CM | POA: Insufficient documentation

## 2020-12-03 DIAGNOSIS — M169 Osteoarthritis of hip, unspecified: Secondary | ICD-10-CM | POA: Insufficient documentation

## 2020-12-03 DIAGNOSIS — R262 Difficulty in walking, not elsewhere classified: Secondary | ICD-10-CM | POA: Diagnosis not present

## 2020-12-03 DIAGNOSIS — N184 Chronic kidney disease, stage 4 (severe): Secondary | ICD-10-CM | POA: Insufficient documentation

## 2020-12-03 DIAGNOSIS — G2581 Restless legs syndrome: Secondary | ICD-10-CM | POA: Insufficient documentation

## 2020-12-03 NOTE — Progress Notes (Signed)
This patient returns to the office for evaluation and treatment of long thick painful nails .  This patient is unable to trim his own nails since the patient cannot reach his feet.  Patient says the nails are painful walking and wearing his shoes. Patient has history of back surgery.   He returns for preventive foot care services.  General Appearance  Alert, conversant and in no acute stress.  Vascular  Dorsalis pedis and posterior tibial  pulses are palpable  bilaterally.  Capillary return is within normal limits  bilaterally. Temperature is within normal limits  bilaterally.  Neurologic  Senn-Weinstein monofilament wire test diminished  bilaterally. Muscle power within normal limits bilaterally.  Nails Thick disfigured discolored nails with subungual debris  from hallux to second  toes bilaterally. No evidence of bacterial infection or drainage bilaterally.  Orthopedic  No limitations of motion  feet .  No crepitus or effusions noted.  No bony pathology or digital deformities noted. Prominent metatarsal heads  B/L.  Skin  Dry scaly  skin with no porokeratosis noted bilaterally.  No signs of infections or ulcers noted.     Onychomycosis  Pain in toes right foot  Pain in toes left foot  Debridement  of nails  1-5  B/L with a nail nipper.  Nails were then filed using a dremel tool with no incidents.   RTC 3 months    Gardiner Barefoot DPM

## 2020-12-09 DIAGNOSIS — M6281 Muscle weakness (generalized): Secondary | ICD-10-CM | POA: Diagnosis not present

## 2020-12-09 DIAGNOSIS — R262 Difficulty in walking, not elsewhere classified: Secondary | ICD-10-CM | POA: Diagnosis not present

## 2020-12-09 DIAGNOSIS — M48061 Spinal stenosis, lumbar region without neurogenic claudication: Secondary | ICD-10-CM | POA: Diagnosis not present

## 2020-12-11 DIAGNOSIS — M48061 Spinal stenosis, lumbar region without neurogenic claudication: Secondary | ICD-10-CM | POA: Diagnosis not present

## 2020-12-11 DIAGNOSIS — R262 Difficulty in walking, not elsewhere classified: Secondary | ICD-10-CM | POA: Diagnosis not present

## 2020-12-11 DIAGNOSIS — M6281 Muscle weakness (generalized): Secondary | ICD-10-CM | POA: Diagnosis not present

## 2020-12-16 DIAGNOSIS — M48061 Spinal stenosis, lumbar region without neurogenic claudication: Secondary | ICD-10-CM | POA: Diagnosis not present

## 2020-12-16 DIAGNOSIS — M6281 Muscle weakness (generalized): Secondary | ICD-10-CM | POA: Diagnosis not present

## 2020-12-16 DIAGNOSIS — R262 Difficulty in walking, not elsewhere classified: Secondary | ICD-10-CM | POA: Diagnosis not present

## 2020-12-19 DIAGNOSIS — M48062 Spinal stenosis, lumbar region with neurogenic claudication: Secondary | ICD-10-CM | POA: Diagnosis not present

## 2020-12-19 DIAGNOSIS — M48061 Spinal stenosis, lumbar region without neurogenic claudication: Secondary | ICD-10-CM | POA: Diagnosis not present

## 2020-12-19 DIAGNOSIS — M5416 Radiculopathy, lumbar region: Secondary | ICD-10-CM | POA: Diagnosis not present

## 2020-12-22 ENCOUNTER — Other Ambulatory Visit: Payer: Self-pay

## 2020-12-22 ENCOUNTER — Inpatient Hospital Stay (HOSPITAL_COMMUNITY)
Admission: EM | Admit: 2020-12-22 | Discharge: 2020-12-25 | DRG: 872 | Disposition: A | Discharge status: Home / Self Care | Payer: Medicare Other | Attending: Internal Medicine | Admitting: Internal Medicine

## 2020-12-22 DIAGNOSIS — Z7952 Long term (current) use of systemic steroids: Secondary | ICD-10-CM

## 2020-12-22 DIAGNOSIS — G4733 Obstructive sleep apnea (adult) (pediatric): Secondary | ICD-10-CM | POA: Diagnosis present

## 2020-12-22 DIAGNOSIS — L03115 Cellulitis of right lower limb: Secondary | ICD-10-CM | POA: Diagnosis not present

## 2020-12-22 DIAGNOSIS — N184 Chronic kidney disease, stage 4 (severe): Secondary | ICD-10-CM | POA: Diagnosis not present

## 2020-12-22 DIAGNOSIS — I130 Hypertensive heart and chronic kidney disease with heart failure and stage 1 through stage 4 chronic kidney disease, or unspecified chronic kidney disease: Secondary | ICD-10-CM | POA: Diagnosis present

## 2020-12-22 DIAGNOSIS — E1165 Type 2 diabetes mellitus with hyperglycemia: Secondary | ICD-10-CM | POA: Diagnosis present

## 2020-12-22 DIAGNOSIS — E1122 Type 2 diabetes mellitus with diabetic chronic kidney disease: Secondary | ICD-10-CM

## 2020-12-22 DIAGNOSIS — I5032 Chronic diastolic (congestive) heart failure: Secondary | ICD-10-CM | POA: Diagnosis not present

## 2020-12-22 DIAGNOSIS — I10 Essential (primary) hypertension: Secondary | ICD-10-CM | POA: Diagnosis present

## 2020-12-22 DIAGNOSIS — A419 Sepsis, unspecified organism: Secondary | ICD-10-CM | POA: Diagnosis not present

## 2020-12-22 DIAGNOSIS — Z79899 Other long term (current) drug therapy: Secondary | ICD-10-CM

## 2020-12-22 DIAGNOSIS — N179 Acute kidney failure, unspecified: Secondary | ICD-10-CM | POA: Diagnosis present

## 2020-12-22 DIAGNOSIS — K219 Gastro-esophageal reflux disease without esophagitis: Secondary | ICD-10-CM | POA: Diagnosis present

## 2020-12-22 DIAGNOSIS — L0390 Cellulitis, unspecified: Secondary | ICD-10-CM | POA: Diagnosis present

## 2020-12-22 DIAGNOSIS — E872 Acidosis: Secondary | ICD-10-CM | POA: Diagnosis present

## 2020-12-22 DIAGNOSIS — Z87891 Personal history of nicotine dependence: Secondary | ICD-10-CM

## 2020-12-22 DIAGNOSIS — E119 Type 2 diabetes mellitus without complications: Secondary | ICD-10-CM

## 2020-12-22 DIAGNOSIS — G8929 Other chronic pain: Secondary | ICD-10-CM | POA: Diagnosis present

## 2020-12-22 DIAGNOSIS — N189 Chronic kidney disease, unspecified: Secondary | ICD-10-CM

## 2020-12-22 DIAGNOSIS — E0800 Diabetes mellitus due to underlying condition with hyperosmolarity without nonketotic hyperglycemic-hyperosmolar coma (NKHHC): Secondary | ICD-10-CM

## 2020-12-22 DIAGNOSIS — Z6832 Body mass index (BMI) 32.0-32.9, adult: Secondary | ICD-10-CM

## 2020-12-22 DIAGNOSIS — E876 Hypokalemia: Secondary | ICD-10-CM

## 2020-12-22 DIAGNOSIS — Z20822 Contact with and (suspected) exposure to covid-19: Secondary | ICD-10-CM | POA: Diagnosis not present

## 2020-12-22 LAB — CBC WITH DIFFERENTIAL/PLATELET
Abs Immature Granulocytes: 0.37 10*3/uL — ABNORMAL HIGH (ref 0.00–0.07)
Basophils Absolute: 0.1 10*3/uL (ref 0.0–0.1)
Basophils Relative: 0 %
Eosinophils Absolute: 0 10*3/uL (ref 0.0–0.5)
Eosinophils Relative: 0 %
HCT: 39 % (ref 39.0–52.0)
Hemoglobin: 13.2 g/dL (ref 13.0–17.0)
Immature Granulocytes: 2 %
Lymphocytes Relative: 5 %
Lymphs Abs: 0.8 10*3/uL (ref 0.7–4.0)
MCH: 32.4 pg (ref 26.0–34.0)
MCHC: 33.8 g/dL (ref 30.0–36.0)
MCV: 95.6 fL (ref 80.0–100.0)
Monocytes Absolute: 1.1 10*3/uL — ABNORMAL HIGH (ref 0.1–1.0)
Monocytes Relative: 7 %
Neutro Abs: 13.3 10*3/uL — ABNORMAL HIGH (ref 1.7–7.7)
Neutrophils Relative %: 86 %
Platelets: 398 10*3/uL (ref 150–400)
RBC: 4.08 MIL/uL — ABNORMAL LOW (ref 4.22–5.81)
RDW: 15 % (ref 11.5–15.5)
WBC: 15.8 10*3/uL — ABNORMAL HIGH (ref 4.0–10.5)
nRBC: 0 % (ref 0.0–0.2)

## 2020-12-22 LAB — BASIC METABOLIC PANEL
Anion gap: 19 — ABNORMAL HIGH (ref 5–15)
BUN: 72 mg/dL — ABNORMAL HIGH (ref 8–23)
CO2: 25 mmol/L (ref 22–32)
Calcium: 8.4 mg/dL — ABNORMAL LOW (ref 8.9–10.3)
Chloride: 86 mmol/L — ABNORMAL LOW (ref 98–111)
Creatinine, Ser: 2.46 mg/dL — ABNORMAL HIGH (ref 0.61–1.24)
GFR, Estimated: 26 mL/min — ABNORMAL LOW (ref >60)
Glucose, Bld: 296 mg/dL — ABNORMAL HIGH (ref 70–99)
Potassium: 2.8 mmol/L — ABNORMAL LOW (ref 3.5–5.1)
Sodium: 130 mmol/L — ABNORMAL LOW (ref 135–145)

## 2020-12-22 LAB — LACTIC ACID, PLASMA: Lactic Acid, Venous: 3.2 mmol/L (ref 0.5–1.9)

## 2020-12-22 MED ORDER — IBUPROFEN 400 MG PO TABS
400.0000 mg | ORAL_TABLET | Freq: Once | ORAL | Status: AC | PRN
  Administered 2020-12-22: 400 mg via ORAL
  Filled 2020-12-22: qty 1

## 2020-12-22 MED ORDER — LACTATED RINGERS IV BOLUS
1000.0000 mL | Freq: Once | INTRAVENOUS | Status: AC
  Administered 2020-12-22: 1000 mL via INTRAVENOUS

## 2020-12-22 MED ORDER — FENTANYL CITRATE (PF) 100 MCG/2ML IJ SOLN
100.0000 ug | Freq: Once | INTRAMUSCULAR | Status: AC
Start: 2020-12-22 — End: 2020-12-22
  Administered 2020-12-22: 100 ug via INTRAVENOUS
  Filled 2020-12-22: qty 2

## 2020-12-22 MED ORDER — LACTATED RINGERS IV SOLN: INTRAVENOUS | Status: DC

## 2020-12-22 MED ORDER — VANCOMYCIN HCL 2000 MG/400ML IV SOLN
2000.0000 mg | Freq: Once | INTRAVENOUS | Status: AC
  Administered 2020-12-22: 2000 mg via INTRAVENOUS
  Filled 2020-12-22: qty 400

## 2020-12-22 NOTE — ED Provider Notes (Signed)
Emergency Medicine Provider Triage Evaluation Note  Robert Dickerson , a 80 y.o. male  was evaluated in triage.  Pt complains of R leg pain, shooting pain and redness. Symptoms began today. No fevers. No DVT history.  Review of Systems  Positive: Leg pain and redness Negative: SOB  Physical Exam  BP (!) 143/68 (BP Location: Left Arm)   Pulse (!) 112   Temp 98 F (36.7 C) (Oral)   Resp 16   SpO2 98%  Gen:   Awake, no distress   Resp:  Normal effort  MSK:   Moves extremities without difficulty  Other:  Erythema and pain of R lower leg near shin  Medical Decision Making  Medically screening exam initiated at 7:07 PM.  Appropriate orders placed.  Robert Dickerson was informed that the remainder of the evaluation will be completed by another provider, this initial triage assessment does not replace that evaluation, and the importance of remaining in the ED until their evaluation is complete.  Labs ordered.   Delia Heady, PA-C 12/22/20 1908    Valarie Merino, MD 12/22/20 2329

## 2020-12-22 NOTE — ED Triage Notes (Signed)
Pt reports that he has has red and shooting pain in his R lower leg all day, pt reports that he is scheduled for back surgery that normally cause leg pain but this is different.

## 2020-12-22 NOTE — ED Provider Notes (Signed)
[facility] EMERGENCY DEPARTMENT Provider Note   CSN: [id number] Arrival date & time: 12/22/20  1702     History Chief Complaint  Patient presents with  . Leg Pain    Robert Dickerson is a 80 y.o. male.  Patient presents to the emergency department with a chief complaint of right lower extremity pain.  He states that the pain started yesterday and has gradually worsened today.  He denies any fevers or chills.  He reports having redness that is extending up from his ankle to his mid shin.  He denies any successful treatments prior to arrival, but did try taking 1000 mg of Tylenol.  He denies any other associated symptoms.  The history is provided by the patient. No language interpreter was used.       Past Medical History:  Diagnosis Date  . Arthritis    all joints  . Chronic kidney disease, stage 3 ([place])   . Congestive heart failure with LV diastolic dysfunction, NYHA class 2 ([place])   . GERD (gastroesophageal reflux disease)   . Hypertension   . Hypertensive chronic kidney disease   . Sleep apnea    uses cpap, pt does not know settings    Patient Active Problem List   Diagnosis Date Noted  . 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  . Chronic kidney disease, stage 4 (severe) ([place]) 12/03/2020  . Degeneration of lumbar intervertebral disc 12/03/2020  . Diabetes mellitus ([place]) 12/03/2020  . Diabetic peripheral neuropathy associated with type 2 diabetes mellitus ([place]) 12/03/2020  . Diastolic heart failure ([place]) 12/03/2020  . Enlarged prostate 12/03/2020  . Erectile dysfunction due to arterial insufficiency 12/03/2020  . Essential hypertension 12/03/2020  . Gastro-esophageal reflux disease without esophagitis 12/03/2020  . Hematuria 12/03/2020  . Impaired fasting glucose 12/03/2020  . 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  . Morbid obesity ([place]) 12/03/2020  . Obstructive sleep  apnea 12/03/2020  . Osteoarthritis of hip 12/03/2020  . Osteoarthritis of knee 12/03/2020  . 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 Restless legs 12/03/2020  . Skin sensation disturbance 12/03/2020  . Body mass index (BMI) 29.0-29.9, adult 11/21/2020  . Lumbar stenosis 11/21/2020  . Tendinitis 09/03/2020  . Subacute arthropathy 09/03/2020  . Bilateral hearing loss 08/11/2020  . Impacted cerumen of left ear 03/10/2017  . Excessive cerumen in left ear canal 03/10/2017  . Faintness 12/09/2015  . Backache 11/14/2014  . 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 APPENDECTOMY    . BACK SURGERY     4 surg, lower  . CHOLECYSTECTOMY    . COLONOSCOPY WITH PROPOFOL N/A 06/11/2014   Procedure: COLONOSCOPY WITH PROPOFOL;  Surgeon: Garlan Fair, MD;  Location: [facility] ENDOSCOPY;  Service: Endoscopy;  Laterality: N/A;  . ESOPHAGOGASTRODUODENOSCOPY  08/26/2011   Procedure: ESOPHAGOGASTRODUODENOSCOPY (EGD);  Surgeon: Garlan Fair, MD;  Location: [facility];  Service: Endoscopy;  Laterality: N/A;  . ESOPHAGOGASTRODUODENOSCOPY (EGD) WITH PROPOFOL N/A 06/11/2014   Procedure: ESOPHAGOGASTRODUODENOSCOPY (EGD) WITH PROPOFOL;  Surgeon: Garlan Fair, MD;  Location: [facility] END[facility]COPY;  Service: Endoscopy;  Laterality: N/A;  . EUS  09/15/2011   Procedure: UPPER ENDOSCOPIC ULTRASOUND (EUS) LINEAR;  Surgeon: Landry Dyke, MD;  Location: [facility] END[facility]COPY;  Service: Endoscopy;  Laterality: N/A;  MAC  . KNEE ARTHROSCOPY Right YEARS AGO       Family History  Problem Relation Age of Onset  . Anesthesia problems  Neg Hx   . Hypotension Neg Hx   . Malignant hyperthermia Neg Hx   . Pseudochol deficiency Neg Hx     Social History   Tobacco Use  . Smoking status: Former Smoker    Packs/day: 2.00    Years: 20.00    Pack years: 40.00    Types: Cigarettes    Quit date: 08/02/1982    Years since quitting: 38.4  . Smokeless tobacco: Never Used  Substance  Use Topics  . Alcohol use: No  . Drug use: No    Home Medications Prior to Admission medications   Medication Sig Start Date End Date Taking? Authorizing Provider  acetaminophen (TYLENOL) 650 MG CR tablet 2 tablets 10/01/20   [provider]  allopurinol (ZYLOPRIM) 100 MG tablet Take 100 mg by mouth daily. 10/04/19   [provider]  amitriptyline (ELAVIL) 10 MG tablet Take 10 mg by mouth at bedtime.     [provider]  amLODipine (NORVASC) 10 MG tablet Take 10 mg by mouth daily.    [provider]  azithromycin (ZITHROMAX) 250 MG tablet  09/15/18   [provider]  cetirizine (ZYRTEC) 10 MG tablet Take 5 mg by mouth at bedtime.     [provider]  colchicine 0.6 MG tablet Take 0.3 mg by mouth daily. 09/19/19   [provider]  colesevelam [facility]) 625 MG tablet  01/24/18   [provider]  diazepam (VALIUM) 5 MG tablet TAKE 2 TABLET BY MOUTH ONE HOUR PRIOR TO EXAM, TAKE ONE ADDITIONAL IF NEEDED, DRIVER NEEDED 01/01/08   [provider]  econazole nitrate 1 % cream Apply 1 application topically daily as needed (rash).     [provider]  erythromycin with ethanol (EMGEL) 2 % gel  01/04/18   [provider]  febuxostat (ULORIC) 40 MG tablet Take 40 mg by mouth daily. 08/23/19   [provider]  furosemide (LASIX) 80 MG tablet  05/23/18   [provider]  gabapentin (NEURONTIN) 100 MG capsule  10/24/19   [provider]  gabapentin (NEURONTIN) 300 MG capsule  08/15/20   [provider]  ibuprofen (ADVIL,MOTRIN) 800 MG tablet TK 1 T PO TID FOR 14 DAYS PRF PAIN 08/27/18   [provider]  LORazepam (ATIVAN) 1 MG tablet 1 tablet 11/06/20   [provider]  losartan (COZAAR) 100 MG tablet Take 100 mg by mouth daily.    [provider]  metolazone (ZAROXOLYN) 5 MG tablet Take 5 mg by mouth daily. 08/09/19   [provider]  naproxen sodium  (ALEVE) 220 MG tablet Take 220 mg by mouth. 04/09/11   [provider]  omeprazole (PRILOSEC) 20 MG capsule Take by mouth.    [provider]  potassium chloride SA (KLOR-CON) 20 MEQ tablet Take 20 mEq by mouth daily. 08/30/20   [provider]  predniSONE (DELTASONE) 5 MG tablet  04/01/19   [provider]  pregabalin (LYRICA) 75 MG capsule Take 75 mg by mouth 2 (two) times daily. 08/19/20   [provider]  [facility] injection  08/15/20   [provider]  tiZANidine (ZANAFLEX) 4 MG tablet TK 1 T PO Q 6 H FOR 7 DAYS 08/27/18   [provider]  triamcinolone cream (KENALOG) 0.5 % Apply 1 application topically daily as needed (rash).    [provider]  VIRTUSSIN A/C 100-10 MG/5ML syrup TK 5 MLS PO Q 4 H PRF COUGH 09/15/18   [provider]  vitamin B-12 (CYANOCOBALAMIN) 1000 MCG tablet Take 1,000 mcg by mouth daily.    [provider]    Allergies    Ace inhibitors, Lisinopril, and Oxycodone  Review of Systems   Review of Systems  All other systems reviewed and are negative.   Physical Exam Updated Vital Signs BP 126/79 (BP Location: Right Arm)   Pulse 87   Temp 98.4 F (36.9 C) (Oral)   Resp 20   Ht _0  (1.753 m)   Wt 99 kg   SpO2 96%   BMI 32.23 kg/m   Physical Exam Vitals and nursing note reviewed.  Constitutional:      General: He is not in acute distress.    Appearance: He is well-developed. He is not ill-appearing.  HENT:     Head: Normocephalic and atraumatic.  Eyes:     Conjunctiva/sclera: Conjunctivae normal.  Cardiovascular:     Rate and Rhythm: Normal rate.     Comments: Distal pulses dopplerable Pulmonary:     Effort: Pulmonary effort is normal. No respiratory distress.  Abdominal:     General: There is no distension.  Musculoskeletal:     Cervical back: Neck supple.     Comments: Moves all extremities  Skin:    General: Skin is warm and dry.  Neurological:     Mental  Status: He is alert and oriented to person, place, and time.  Psychiatric:        Mood and Affect: Mood normal.        Behavior: Behavior normal.       ED Results / Procedures / Treatments   Labs (all labs ordered are listed, but only abnormal results are displayed) Labs Reviewed  BASIC METABOLIC PANEL - Abnormal; Notable for the following components:      Result Value   Sodium 130 (*)    Potassium 2.8 (*)    Chloride 86 (*)    Glucose, Bld 296 (*)    BUN 72 (*)    Creatinine, Ser 2.46 (*)    Calcium 8.4 (*)    GFR, Estimated 26 (*)    Anion gap 19 (*)    All other components within normal limits  CBC WITH DIFFERENTIAL/PLATELET - Abnormal; Notable for the following components:   WBC 15.8 (*)    RBC 4.08 (*)    Neutro Abs 13.3 (*)    Monocytes Absolute 1.1 (*)    Abs Immature Granulocytes 0.37 (*)    All other components within normal limits  LACTIC ACID, PLASMA - Abnormal; Notable for the following components:   Lactic Acid, Venous 3.2 (*)    All other components within normal limits  CULTURE, BLOOD (ROUTINE X 2)  CULTURE, BLOOD (ROUTINE X 2)  LACTIC ACID, PLASMA    EKG None  Radiology No results found.  Procedures .Critical Care Performed by: Montine Circle, PA-C Authorized by: Montine Circle, PA-C   Critical care provider statement:    Critical care time (minutes):  35   Critical care was necessary to treat or prevent imminent or life-threatening deterioration of the following conditions:  Sepsis   Critical care was time spent personally by me on the following activities:  Discussions with consultants, evaluation of patient's response to treatment, examination of patient, ordering and performing treatments and interventions, ordering and review of laboratory studies, ordering and review of radiographic studies, pulse oximetry, re-evaluation of patient's condition, obtaining history from patient or surrogate and review of old charts  Medications  Ordered in ED Medications  lactated ringers infusion (has no administration in time range)  lactated ringers bolus 1,000 mL (has no administration in time range)  ibuprofen (ADVIL) tablet 400 mg (400 mg Oral Given 12/22/20 1915)    ED Course  I have reviewed the triage vital signs and the nursing notes.  Pertinent labs & imaging results that were available during my care of the patient were reviewed by me and considered in my medical decision making (see chart for details).    MDM Rules/Calculators/A&P                          This patient complains of right leg pain, this involves an extensive number of treatment options, and is a complaint that carries with it a high risk of complications and morbidity.    Differential Dx Cellulitis, DVT, peripheral edema, ischemia  Pertinent Labs I ordered, reviewed, and interpreted labs. WBC elevated at 15.8 Na low at 130 K low at 2.8 Lactic high at 3.2  Medications I ordered medication vanc and fentanyl for pain and infection.  Reassessments After the interventions stated above, I reevaluated the patient and found stable for admission.  Consultants Dr. Flossie Buffy, who is appreciated for admitting.  Plan Admit    Final Clinical Impression(s) / ED Diagnoses Final diagnoses:  Cellulitis of right lower extremity    Rx / DC Orders ED Discharge Orders    None       Montine Circle, PA-C 12/23/20 0007    Merryl Hacker, MD 12/23/20 [phone]

## 2020-12-23 DIAGNOSIS — G8929 Other chronic pain: Secondary | ICD-10-CM | POA: Diagnosis present

## 2020-12-23 DIAGNOSIS — E1165 Type 2 diabetes mellitus with hyperglycemia: Secondary | ICD-10-CM

## 2020-12-23 DIAGNOSIS — G4733 Obstructive sleep apnea (adult) (pediatric): Secondary | ICD-10-CM

## 2020-12-23 DIAGNOSIS — L03115 Cellulitis of right lower limb: Secondary | ICD-10-CM

## 2020-12-23 DIAGNOSIS — N179 Acute kidney failure, unspecified: Secondary | ICD-10-CM | POA: Diagnosis present

## 2020-12-23 DIAGNOSIS — E872 Acidosis: Secondary | ICD-10-CM | POA: Diagnosis present

## 2020-12-23 DIAGNOSIS — I5032 Chronic diastolic (congestive) heart failure: Secondary | ICD-10-CM | POA: Diagnosis present

## 2020-12-23 DIAGNOSIS — Z6832 Body mass index (BMI) 32.0-32.9, adult: Secondary | ICD-10-CM | POA: Diagnosis not present

## 2020-12-23 DIAGNOSIS — Z87891 Personal history of nicotine dependence: Secondary | ICD-10-CM | POA: Diagnosis not present

## 2020-12-23 DIAGNOSIS — N184 Chronic kidney disease, stage 4 (severe): Secondary | ICD-10-CM | POA: Diagnosis present

## 2020-12-23 DIAGNOSIS — E876 Hypokalemia: Secondary | ICD-10-CM

## 2020-12-23 DIAGNOSIS — Z79899 Other long term (current) drug therapy: Secondary | ICD-10-CM | POA: Diagnosis not present

## 2020-12-23 DIAGNOSIS — A419 Sepsis, unspecified organism: Secondary | ICD-10-CM | POA: Diagnosis present

## 2020-12-23 DIAGNOSIS — E1122 Type 2 diabetes mellitus with diabetic chronic kidney disease: Secondary | ICD-10-CM | POA: Diagnosis present

## 2020-12-23 DIAGNOSIS — N189 Chronic kidney disease, unspecified: Secondary | ICD-10-CM

## 2020-12-23 DIAGNOSIS — I10 Essential (primary) hypertension: Secondary | ICD-10-CM | POA: Diagnosis not present

## 2020-12-23 DIAGNOSIS — I130 Hypertensive heart and chronic kidney disease with heart failure and stage 1 through stage 4 chronic kidney disease, or unspecified chronic kidney disease: Secondary | ICD-10-CM | POA: Diagnosis present

## 2020-12-23 DIAGNOSIS — Z20822 Contact with and (suspected) exposure to covid-19: Secondary | ICD-10-CM | POA: Diagnosis present

## 2020-12-23 DIAGNOSIS — L0390 Cellulitis, unspecified: Secondary | ICD-10-CM | POA: Diagnosis not present

## 2020-12-23 DIAGNOSIS — K219 Gastro-esophageal reflux disease without esophagitis: Secondary | ICD-10-CM | POA: Diagnosis present

## 2020-12-23 DIAGNOSIS — Z7952 Long term (current) use of systemic steroids: Secondary | ICD-10-CM | POA: Diagnosis not present

## 2020-12-23 LAB — CBC
HCT: 35.5 % — ABNORMAL LOW (ref 39.0–52.0)
Hemoglobin: 12.1 g/dL — ABNORMAL LOW (ref 13.0–17.0)
MCH: 32.4 pg (ref 26.0–34.0)
MCHC: 34.1 g/dL (ref 30.0–36.0)
MCV: 94.9 fL (ref 80.0–100.0)
Platelets: 323 10*3/uL (ref 150–400)
RBC: 3.74 MIL/uL — ABNORMAL LOW (ref 4.22–5.81)
RDW: 14.8 % (ref 11.5–15.5)
WBC: 10.5 10*3/uL (ref 4.0–10.5)
nRBC: 0 % (ref 0.0–0.2)

## 2020-12-23 LAB — BASIC METABOLIC PANEL
Anion gap: 14 (ref 5–15)
Anion gap: 15 (ref 5–15)
BUN: 68 mg/dL — ABNORMAL HIGH (ref 8–23)
BUN: 61 mg/dL — ABNORMAL HIGH (ref 8–23)
CO2: 28 mmol/L (ref 22–32)
CO2: 27 mmol/L (ref 22–32)
Calcium: 7.9 mg/dL — ABNORMAL LOW (ref 8.9–10.3)
Calcium: 8.2 mg/dL — ABNORMAL LOW (ref 8.9–10.3)
Chloride: 90 mmol/L — ABNORMAL LOW (ref 98–111)
Chloride: 91 mmol/L — ABNORMAL LOW (ref 98–111)
Creatinine, Ser: 2.41 mg/dL — ABNORMAL HIGH (ref 0.61–1.24)
Creatinine, Ser: 2.2 mg/dL — ABNORMAL HIGH (ref 0.61–1.24)
GFR, Estimated: 27 mL/min — ABNORMAL LOW (ref >60)
GFR, Estimated: 30 mL/min — ABNORMAL LOW (ref >60)
Glucose, Bld: 241 mg/dL — ABNORMAL HIGH (ref 70–99)
Glucose, Bld: 233 mg/dL — ABNORMAL HIGH (ref 70–99)
Potassium: 2.4 mmol/L — CL (ref 3.5–5.1)
Potassium: 3.2 mmol/L — ABNORMAL LOW (ref 3.5–5.1)
Sodium: 132 mmol/L — ABNORMAL LOW (ref 135–145)
Sodium: 133 mmol/L — ABNORMAL LOW (ref 135–145)

## 2020-12-23 LAB — LACTIC ACID, PLASMA
Lactic Acid, Venous: 3.2 mmol/L (ref 0.5–1.9)
Lactic Acid, Venous: 2.2 mmol/L (ref 0.5–1.9)
Lactic Acid, Venous: 2.3 mmol/L (ref 0.5–1.9)

## 2020-12-23 LAB — HEMOGLOBIN A1C
Hgb A1c MFr Bld: 9.5 % — ABNORMAL HIGH (ref 4.8–5.6)
Mean Plasma Glucose: 225.95 mg/dL

## 2020-12-23 LAB — GLUCOSE, CAPILLARY
Glucose-Capillary: 221 mg/dL — ABNORMAL HIGH (ref 70–99)
Glucose-Capillary: 254 mg/dL — ABNORMAL HIGH (ref 70–99)
Glucose-Capillary: 300 mg/dL — ABNORMAL HIGH (ref 70–99)
Glucose-Capillary: 296 mg/dL — ABNORMAL HIGH (ref 70–99)

## 2020-12-23 LAB — RESP PANEL BY RT-PCR (FLU A&B, COVID) ARPGX2
Influenza A by PCR: NEGATIVE
Influenza B by PCR: NEGATIVE
SARS Coronavirus 2 by RT PCR: NEGATIVE

## 2020-12-23 LAB — MAGNESIUM
Magnesium: 2 mg/dL (ref 1.7–2.4)
Magnesium: 2 mg/dL (ref 1.7–2.4)

## 2020-12-23 MED ORDER — VANCOMYCIN HCL 1500 MG/300ML IV SOLN
1500.0000 mg | INTRAVENOUS | Status: DC
  Administered 2020-12-25: 1500 mg via INTRAVENOUS
  Filled 2020-12-23: qty 300

## 2020-12-23 MED ORDER — LIVING WELL WITH DIABETES BOOK
Freq: Once | Status: AC
  Filled 2020-12-23: qty 1

## 2020-12-23 MED ORDER — AMLODIPINE BESYLATE 10 MG PO TABS
10.0000 mg | ORAL_TABLET | Freq: Every day | ORAL | Status: DC
  Administered 2020-12-23 – 2020-12-25 (×2): 10 mg via ORAL
  Filled 2020-12-23 (×3): qty 1

## 2020-12-23 MED ORDER — INSULIN ASPART 100 UNIT/ML IJ SOLN
0.0000 [IU] | Freq: Three times a day (TID) | INTRAMUSCULAR | Status: DC
  Administered 2020-12-23 (×2): 5 [IU] via SUBCUTANEOUS
  Administered 2020-12-23: 3 [IU] via SUBCUTANEOUS
  Administered 2020-12-23 – 2020-12-24 (×2): 5 [IU] via SUBCUTANEOUS
  Administered 2020-12-24: 3 [IU] via SUBCUTANEOUS
  Administered 2020-12-24: 1 [IU] via SUBCUTANEOUS
  Administered 2020-12-24 – 2020-12-25 (×3): 3 [IU] via SUBCUTANEOUS
  Administered 2020-12-25 (×2): 1 [IU] via SUBCUTANEOUS
  Administered 2020-12-25: 2 [IU] via SUBCUTANEOUS

## 2020-12-23 MED ORDER — ALLOPURINOL 300 MG PO TABS
300.0000 mg | ORAL_TABLET | Freq: Every day | ORAL | Status: DC
  Administered 2020-12-23 – 2020-12-25 (×3): 300 mg via ORAL
  Filled 2020-12-23 (×4): qty 1

## 2020-12-23 MED ORDER — ENOXAPARIN SODIUM 30 MG/0.3ML IJ SOSY
30.0000 mg | PREFILLED_SYRINGE | Freq: Every day | INTRAMUSCULAR | Status: DC
  Administered 2020-12-23 – 2020-12-25 (×3): 30 mg via SUBCUTANEOUS
  Filled 2020-12-23 (×3): qty 0.3

## 2020-12-23 MED ORDER — POTASSIUM CHLORIDE CRYS ER 20 MEQ PO TBCR
20.0000 meq | EXTENDED_RELEASE_TABLET | Freq: Every day | ORAL | Status: DC
  Administered 2020-12-23 – 2020-12-25 (×3): 20 meq via ORAL
  Filled 2020-12-23 (×3): qty 1

## 2020-12-23 MED ORDER — SODIUM CHLORIDE 0.9 % IV SOLN: INTRAVENOUS | Status: AC

## 2020-12-23 MED ORDER — POTASSIUM CHLORIDE CRYS ER 20 MEQ PO TBCR
40.0000 meq | EXTENDED_RELEASE_TABLET | Freq: Once | ORAL | Status: AC
  Administered 2020-12-23: 40 meq via ORAL
  Filled 2020-12-23: qty 2

## 2020-12-23 MED ORDER — INSULIN GLARGINE 100 UNIT/ML ~~LOC~~ SOLN
10.0000 [IU] | Freq: Every day | SUBCUTANEOUS | Status: DC
  Administered 2020-12-23 – 2020-12-25 (×3): 10 [IU] via SUBCUTANEOUS
  Filled 2020-12-23 (×3): qty 0.1

## 2020-12-23 MED ORDER — POTASSIUM CHLORIDE CRYS ER 20 MEQ PO TBCR
40.0000 meq | EXTENDED_RELEASE_TABLET | ORAL | Status: AC
  Administered 2020-12-23 (×2): 40 meq via ORAL
  Filled 2020-12-23 (×2): qty 2

## 2020-12-23 MED ORDER — PREGABALIN 75 MG PO CAPS
75.0000 mg | ORAL_CAPSULE | Freq: Two times a day (BID) | ORAL | Status: DC
  Administered 2020-12-23 – 2020-12-25 (×5): 75 mg via ORAL
  Filled 2020-12-23 (×5): qty 1

## 2020-12-23 MED ORDER — SODIUM CHLORIDE 0.9 % IV SOLN
1.0000 g | Freq: Every day | INTRAVENOUS | Status: DC
  Administered 2020-12-23 – 2020-12-25 (×3): 1 g via INTRAVENOUS
  Filled 2020-12-23 (×3): qty 10

## 2020-12-23 MED ORDER — FENTANYL CITRATE (PF) 100 MCG/2ML IJ SOLN
25.0000 ug | INTRAMUSCULAR | Status: AC | PRN
  Administered 2020-12-23 (×2): 50 ug via INTRAVENOUS
  Filled 2020-12-23 (×2): qty 2

## 2020-12-23 MED ORDER — LORATADINE 10 MG PO TABS
10.0000 mg | ORAL_TABLET | Freq: Every day | ORAL | Status: DC
  Administered 2020-12-23 – 2020-12-24 (×2): 10 mg via ORAL
  Filled 2020-12-23 (×2): qty 1

## 2020-12-23 MED ORDER — POTASSIUM CHLORIDE 10 MEQ/100ML IV SOLN
10.0000 meq | INTRAVENOUS | Status: AC
  Administered 2020-12-23 (×2): 10 meq via INTRAVENOUS
  Filled 2020-12-23 (×2): qty 100

## 2020-12-23 MED ORDER — PREDNISONE 5 MG PO TABS
5.0000 mg | ORAL_TABLET | Freq: Every day | ORAL | Status: DC
  Administered 2020-12-23 – 2020-12-25 (×3): 5 mg via ORAL
  Filled 2020-12-23 (×3): qty 1

## 2020-12-23 MED ORDER — HYDROCODONE-ACETAMINOPHEN 5-325 MG PO TABS
1.0000 | ORAL_TABLET | ORAL | Status: DC | PRN
  Administered 2020-12-23 – 2020-12-24 (×5): 1 via ORAL
  Filled 2020-12-23 (×5): qty 1

## 2020-12-23 NOTE — ED Notes (Signed)
Attending Md has been paged x2; awaiting call back.

## 2020-12-23 NOTE — Progress Notes (Signed)
[facility] following for Sepsis Protocol 

## 2020-12-23 NOTE — Progress Notes (Signed)
Inpatient Diabetes Program Recommendations  AACE/ADA: New Consensus Statement on Inpatient Glycemic Control (2015)  Target Ranges:  Prepandial:   less than 140 mg/dL      Peak postprandial:   less than 180 mg/dL (1-2 hours)      Critically ill patients:  140 - 180 mg/dL   Lab Results  Component Value Date   GLUCAP 254 (H) 12/23/2020   HGBA1C 9.5 (H) 12/23/2020    Review of Glycemic Control Results for Robert Dickerson, Robert Dickerson (MRN [id number]) as of 12/23/2020 14:06  Ref. Range 12/23/2020 09:13 12/23/2020 13:09  Glucose-Capillary Latest Ref Range: 70 - 99 mg/dL 221 (H) 254 (H)   Diabetes history: DM2 Outpatient Diabetes medications: None Current orders for Inpatient glycemic control: Novolog 0-9 units tid 2 hrs. Post prandical correction + 0200  Inpatient Diabetes Program Recommendations:   While in the hospital: -Add Levemir 10 units (0.1 units/kg x 99 kg) -Change Novolog correction to 0-9 units tid with meals & hs 0-5 Secure chat sent to Dr. Antonieta Pert.  Thank you, Nani Gasser. Chasin Findling, RN, MSN, CDE  Diabetes Coordinator Inpatient Glycemic Control Team Team Pager [phone] (8am-5pm) 12/23/2020 2:13 PM

## 2020-12-23 NOTE — ED Notes (Signed)
Pt stated he normally take BP pills

## 2020-12-23 NOTE — ED Notes (Signed)
Pt provided with urinal

## 2020-12-23 NOTE — H&P (Signed)
History and Physical    Robert Dickerson CBS:[id number] DOB: 08/05/1940 DOA: 12/22/2020  PCP: Lavone Orn, MD  Patient coming from: Home  I have personally briefly reviewed patient's old medical records in [place]  Chief Complaint: Right lower extremity pain  HPI: Robert Dickerson is a 80 y.o. male with medical history significant for chronic diastolic heart failure, hypertension, OSA, chronic pancreatitis, type 2 diabetes, CKD 4 and morbid obesity who presents with increasing right lower extremity pain.  He woke up this morning with throbbing right lower extremity pain and new spreading erythema on the distal pretibial region.  No previous wounds or ulcer on the leg has diabetes with peripheral neuropathy.  He denies any fevers or chills.  No nausea, vomiting or diarrhea or abdominal pain.  ED Course: Patient was afebrile but tachycardic and tachypneic with WBC of 15.  Sodium of 130, potassium 2.8, glucose of 296.  Creatinine of 2.46 with no recent prior for comparison.  Anion gap of 19.  Review of Systems:  Constitutional: No Weight Change, No Fever ENT/Mouth: No sore throat, No Rhinorrhea Eyes: No Eye Pain, No Vision Changes Cardiovascular: No Chest Pain, no SOB Respiratory: No Cough, No Sputum, No Wheezing, no Dyspnea  Gastrointestinal: No Nausea, No Vomiting, No Diarrhea, No Constipation, No Pain Genitourinary: no Urinary Incontinence, No Urgency, No Flank Pain Musculoskeletal: No Arthralgias, No Myalgias Skin: No Skin Lesions, No Pruritus, Neuro: no Weakness, No Numbness Psych: No Anxiety/Panic, No Depression, no decrease appetite Heme/Lymph: No Bruising, No Bleeding  Past Medical History:  Diagnosis Date  . Arthritis    all joints  . Chronic kidney disease, stage 3 ([place])   . Congestive heart failure with LV diastolic dysfunction, NYHA class 2 ([place])   . GERD (gastroesophageal reflux disease)   . Hypertension   . Hypertensive chronic kidney disease   . Sleep apnea     uses cpap, pt does not know settings    Past Surgical History:  Procedure Laterality Date  . APPENDECTOMY    . BACK SURGERY     4 surg, lower  . CHOLECYSTECTOMY    . COLONOSCOPY WITH PROPOFOL N/A 06/11/2014   Procedure: COLONOSCOPY WITH PROPOFOL;  Surgeon: Garlan Fair, MD;  Location: [facility] ENDOSCOPY;  Service: Endoscopy;  Laterality: N/A;  . ESOPHAGOGASTRODUODENOSCOPY  08/26/2011   Procedure: ESOPHAGOGASTRODUODENOSCOPY (EGD);  Surgeon: Garlan Fair, MD;  Location: [facility];  Service: Endoscopy;  Laterality: N/A;  . ESOPHAGOGASTRODUODENOSCOPY (EGD) WITH PROPOFOL N/A 06/11/2014   Procedure: ESOPHAGOGASTRODUODENOSCOPY (EGD) WITH PROPOFOL;  Surgeon: Garlan Fair, MD;  Location: [facility];  Service: Endoscopy;  Laterality: N/A;  . EUS  09/15/2011   Procedure: UPPER ENDOSCOPIC ULTRASOUND (EUS) LINEAR;  Surgeon: Landry Dyke, MD;  Location: [facility] END[facility]COPY;  Service: Endoscopy;  Laterality: N/A;  MAC  . KNEE ARTHROSCOPY Right YEARS AGO     reports that he quit smoking about 38 years ago. His smoking use included cigarettes. He has a 40.00 pack-year smoking history. He has never used smokeless tobacco. He reports that he does not drink alcohol and does not use drugs. Social History  Allergies  Allergen Reactions  . Ace Inhibitors Cough and Other (See Comments)    Other reaction(s): Cough Other reaction(s): Cough Other reaction(s): Cough Other reaction(s): Cough Other reaction(s): Cough   . Lisinopril Cough    Other reaction(s): Cough  . Oxycodone     "out of it" "out of it" "out of it"    Family History  Problem  Relation Age of Onset  . Anesthesia problems Neg Hx   . Hypotension Neg Hx   . Malignant hyperthermia Neg Hx   . Pseudochol deficiency Neg Hx      Prior to Admission medications   Medication Sig Start Date End Date Taking? Authorizing Provider  acetaminophen (TYLENOL) 650 MG CR tablet 2 tablets 10/01/20   [provider]  allopurinol  (ZYLOPRIM) 100 MG tablet Take 100 mg by mouth daily. 10/04/19   [provider]  amitriptyline (ELAVIL) 10 MG tablet Take 10 mg by mouth at bedtime.     [provider]  amLODipine (NORVASC) 10 MG tablet Take 10 mg by mouth daily.    [provider]  azithromycin (ZITHROMAX) 250 MG tablet  09/15/18   [provider]  cetirizine (ZYRTEC) 10 MG tablet Take 5 mg by mouth at bedtime.     [provider]  colchicine 0.6 MG tablet Take 0.3 mg by mouth daily. 09/19/19   [provider]  colesevelam [facility]) 625 MG tablet  01/24/18   [provider]  diazepam (VALIUM) 5 MG tablet TAKE 2 TABLET BY MOUTH ONE HOUR PRIOR TO EXAM, TAKE ONE ADDITIONAL IF NEEDED, DRIVER NEEDED 2/99/24   [provider]  econazole nitrate 1 % cream Apply 1 application topically daily as needed (rash).     [provider]  erythromycin with ethanol (EMGEL) 2 % gel  01/04/18   [provider]  febuxostat (ULORIC) 40 MG tablet Take 40 mg by mouth daily. 08/23/19   [provider]  furosemide (LASIX) 80 MG tablet  05/23/18   [provider]  gabapentin (NEURONTIN) 100 MG capsule  10/24/19   [provider]  gabapentin (NEURONTIN) 300 MG capsule  08/15/20   [provider]  ibuprofen (ADVIL,MOTRIN) 800 MG tablet TK 1 T PO TID FOR 14 DAYS PRF PAIN 08/27/18   [provider]  LORazepam (ATIVAN) 1 MG tablet 1 tablet 11/06/20   [provider]  losartan (COZAAR) 100 MG tablet Take 100 mg by mouth daily.    [provider]  metolazone (ZAROXOLYN) 5 MG tablet Take 5 mg by mouth daily. 08/09/19   [provider]  naproxen sodium (ALEVE) 220 MG tablet Take 220 mg by mouth. 04/09/11   [provider]  omeprazole (PRILOSEC) 20 MG capsule Take by mouth.    [provider]  potassium chloride SA (KLOR-CON) 20 MEQ tablet Take 20 mEq by mouth daily. 08/30/20   [provider]   predniSONE (DELTASONE) 5 MG tablet  04/01/19   [provider]  pregabalin (LYRICA) 75 MG capsule Take 75 mg by mouth 2 (two) times daily. 08/19/20   [provider]  [facility] injection  08/15/20   [provider]  tiZANidine (ZANAFLEX) 4 MG tablet TK 1 T PO Q 6 H FOR 7 DAYS 08/27/18   [provider]  triamcinolone cream (KENALOG) 0.5 % Apply 1 application topically daily as needed (rash).    [provider]  VIRTUSSIN A/C 100-10 MG/5ML syrup TK 5 MLS PO Q 4 H PRF COUGH 09/15/18   [provider]  vitamin B-12 (CYANOCOBALAMIN) 1000 MCG tablet Take 1,000 mcg by mouth daily.    [provider]    Physical Exam: Vitals:   12/22/20 1708 12/22/20 2026 12/22/20 2157 12/22/20 2245  BP: (!) 143/68 127/61 133/78 126/79  Pulse: (!) 112 89 78 87  Resp: 16 (!) _0 Temp: 98 F (  36.7 C) 98.4 F (36.9 C)  98.4 F (36.9 C)  TempSrc: Oral Oral  Oral  SpO2: 98% 99% 98% 96%  Weight:    99 kg  Height:    _0  (1.753 m)    Constitutional: NAD, calm, comfortable, obese male laying at 30 degree incline in bed Vitals:   12/22/20 1708 12/22/20 2026 12/22/20 2157 12/22/20 2245  BP: (!) 143/68 127/61 133/78 126/79  Pulse: (!) 112 89 78 87  Resp: 16 (!) _1 Temp: 98 F (36.7 C) 98.4 F (36.9 C)  98.4 F (36.9 C)  TempSrc: Oral Oral  Oral  SpO2: 98% 99% 98% 96%  Weight:    99 kg  Height:    _2  (1.753 m)   Eyes: PERRL, lids and conjunctivae normal ENMT: Mucous membranes are moist. Neck: normal, supple Respiratory: clear to auscultation bilaterally, no wheezing, no crackles. Normal respiratory effort. No accessory muscle use.  Cardiovascular: Regular rate and rhythm, no murmurs / rubs / gallops. No extremity edema. 2+ pedal pulses. Abdomen: no tenderness, no masses palpated.  Bowel sounds positive.  Musculoskeletal: no clubbing / cyanosis. No joint deformity upper and lower extremities. Good ROM, no contractures. Normal  muscle tone.  Skin:spreading erythema of the distal pretibial region of the right lower extremity with increased warmth to palpation and pain with light touch. Neurologic: CN 2-12 grossly intact. Sensation intact. Strength 5/5 in all 4.  Psychiatric: Normal judgment and insight. Alert and oriented x 3. Normal mood.     Labs on Admission: I have personally reviewed following labs and imaging studies  CBC: Recent Labs  Lab 12/22/20 1957  WBC 15.8*  NEUTROABS 13.3*  HGB 13.2  HCT 39.0  MCV 95.6  PLT 536   Basic Metabolic Panel: Recent Labs  Lab 12/22/20 1957  NA 130*  K 2.8*  CL 86*  CO2 25  GLUCOSE 296*  BUN 72*  CREATININE 2.46*  CALCIUM 8.4*   GFR: Estimated Creatinine Clearance: 28.7 mL/min (A) (by C-G formula based on SCr of 2.46 mg/dL (H)). Liver Function Tests: No results for input(s): AST, ALT, ALKPHOS, BILITOT, PROT, ALBUMIN in the last 168 hours. No results for input(s): LIPASE, AMYLASE in the last 168 hours. No results for input(s): AMMONIA in the last 168 hours. Coagulation Profile: No results for input(s): INR, PROTIME in the last 168 hours. Cardiac Enzymes: No results for input(s): CKTOTAL, CKMB, CKMBINDEX, TROPONINI in the last 168 hours. BNP (last 3 results) No results for input(s): PROBNP in the last 8760 hours. HbA1C: No results for input(s): HGBA1C in the last 72 hours. CBG: No results for input(s): GLUCAP in the last 168 hours. Lipid Profile: No results for input(s): CHOL, HDL, LDLCALC, TRIG, CHOLHDL, LDLDIRECT in the last 72 hours. Thyroid Function Tests: No results for input(s): TSH, T4TOTAL, FREET4, T3FREE, THYROIDAB in the last 72 hours. Anemia Panel: No results for input(s): VITAMINB12, FOLATE, FERRITIN, TIBC, IRON, RETICCTPCT in the last 72 hours. Urine analysis:    Component Value Date/Time   COLORURINE YELLOW 11/23/2015 1804   APPEARANCEUR CLEAR 11/23/2015 1804   LABSPEC 1.017 11/23/2015 1804   PHURINE 6.0 11/23/2015 1804    GLUCOSEU NEGATIVE 11/23/2015 1804   HGBUR NEGATIVE 11/23/2015 1804   BILIRUBINUR NEGATIVE 11/23/2015 1804   KETONESUR NEGATIVE 11/23/2015 1804   PROTEINUR NEGATIVE 11/23/2015 1804   UROBILINOGEN 0.2 03/15/2009 0921   NITRITE NEGATIVE 11/23/2015 1804   LEUKOCYTESUR NEGATIVE 11/23/2015 1804    Radiological Exams on Admission: No results found.  Assessment/Plan  Sepsis secondary to right lower extremity cellulitis - Patient presented with tachycardia, tachypnea, elevated WBC - Lactate of 3.2.  Continue to trend - Continue IV vancomycin with addition of IV Rocephin   Chronic diastolic heart failure -appears euvolemic.  Monitor closely while receiving IV fluids for sepsis  Possible acute on chronic kidney disease stage IV - Creatinine of 2.46 but with no prior for comparison - Monitor repeat creatinine following IV fluids  Hypokalemia - Replete with IV potassium  Hypertension - continue home antihypertensives   OSA -Continue CPAP  Type 2 diabetes with hyperglycemia -sensitive Sliding scale  Morbid obesity - BMI of 32  DVT prophylaxis:.Lovenox Code Status: Full Family Communication: Plan discussed with patient at bedside  disposition Plan: Home with at least 2 midnight stays  Consults called:  Admission status: inpatient  Level of care: Telemetry Medical  Status is: Inpatient  Remains inpatient appropriate because:Inpatient level of care appropriate due to severity of illness   Dispo: The patient is from: Home              Anticipated d/c is to: Home              Patient currently is not medically stable to d/c.   Difficult to place patient No         Orene Desanctis DO [facility]   If 7PM-7AM, please contact night-coverage [email or web address]   12/23/2020, 12:03 AM

## 2020-12-23 NOTE — ED Notes (Signed)
Breakfast at the bedside.

## 2020-12-23 NOTE — Progress Notes (Signed)
Pharmacy Antibiotic Note  Robert Dickerson is a 80 y.o. male admitted on 12/22/2020 with RLE cellulitis/sepsis.  Pharmacy has been consulted for Vancomycin dosing.  Vancomycin 2 g IV given in ED at 2330   Plan: Vancomycin 1500 mg IV q48h  Height: 5\' 9"  (175.3 cm) Weight: 99 kg (218 lb 4.1 oz) IBW/kg (Calculated) : 70.7  Temp (24hrs), Avg:98.3 F (36.8 C), Min:98 F (36.7 C), Max:98.4 F (36.9 C)  Recent Labs  Lab 12/22/20 1957 12/22/20 2022 12/23/20 0026  WBC 15.8*  --   --   CREATININE 2.46*  --   --   LATICACIDVEN  --  3.2* 3.2*    Estimated Creatinine Clearance: 28.7 mL/min (A) (by C-G formula based on SCr of 2.46 mg/dL (H)).    Allergies  Allergen Reactions  . Ace Inhibitors Other (See Comments) and Cough  . Lisinopril Cough    Other reaction(s): Cough  . Oxycodone Other (See Comments)    "out of it"     Caryl Pina 12/23/2020 2:26 AM

## 2020-12-23 NOTE — Progress Notes (Signed)
Patient seen and examined personally, I reviewed the chart, history and physical and admission note, done by admitting physician this morning and agree with the same with following addendum.  Please refer to the morning admission note for more detailed plan of care.  Briefly,  80 year old man with chronic diastolic CHF leg swelling, hypertension OSA, chronic pancreatitis, T2DM CKD stage IV followed by [place] kidney morbid obesity presented with increasing right lower extremity pain and after he woke up in the morning with throbbing pain 12/22/2020. In the ED he was afebrile but tachycardic tachypneic with leukocytosis, lactic acidosis hypokalemia hyperglycemia, and elevated creatinine. Patient was admitted on progressive antibiotic after aggressive IV fluid hydration.  Seen this morning. Wife is at the bedside Resting comfortably, reports right lower extremity pain is better less redness but still swollen. Pain is not constant and just throbbing  Issue:  Sepsis POA with right lower extremity cellulitis: Patient had lactic acid tachycardia tachypnea and leukocytosis.  Continue vancomycin IV Rocephin, IV fluids.  Lactic acid is downtrending, currently afebrile pain is improving,WBC count improved. Repeat lactic acid. Recent Labs  Lab 12/22/20 1957 12/22/20 2022 12/23/20 0026 12/23/20 0315  WBC 15.8*  --   --  10.5  LATICACIDVEN  --  3.2* 3.2* 2.2*   Chronic diastolic CHF with chronic leg edema: He is on Lasix 80 mg twice daily and currently on hold.  Hypertension: Controlled on home amlodipine.  Chronic pain on chronic prednisone 5 mg taking it for many years.  Resumed it  Severe hypokalemia repleting aggressively repeat labs today. Recent Labs  Lab 12/22/20 1957 12/23/20 0315  K 2.8* 2.4*   Possible AKI on CKD stage IV versus progression of his CKD followed by [place] [facility] creatinine stable at 2.4 previously were 2.0, 5 years ago.  Holding NSAIDs and Lasix continue gentle IV  fluids and monitor. Recent Labs    12/22/20 1957 12/23/20 0315  BUN 72* 68*  CREATININE 2.46* 2.41*   Morbid obesity will benefit with weight loss OSA on CPAP  Plan of care discussed with the patient and his wife at the bedside

## 2020-12-24 DIAGNOSIS — L03115 Cellulitis of right lower limb: Secondary | ICD-10-CM | POA: Diagnosis not present

## 2020-12-24 LAB — BASIC METABOLIC PANEL
Anion gap: 11 (ref 5–15)
BUN: 55 mg/dL — ABNORMAL HIGH (ref 8–23)
CO2: 23 mmol/L (ref 22–32)
Calcium: 8 mg/dL — ABNORMAL LOW (ref 8.9–10.3)
Chloride: 101 mmol/L (ref 98–111)
Creatinine, Ser: 2.03 mg/dL — ABNORMAL HIGH (ref 0.61–1.24)
GFR, Estimated: 33 mL/min — ABNORMAL LOW (ref >60)
Glucose, Bld: 222 mg/dL — ABNORMAL HIGH (ref 70–99)
Potassium: 3.4 mmol/L — ABNORMAL LOW (ref 3.5–5.1)
Sodium: 135 mmol/L (ref 135–145)

## 2020-12-24 LAB — CBC
HCT: 32.9 % — ABNORMAL LOW (ref 39.0–52.0)
Hemoglobin: 11.2 g/dL — ABNORMAL LOW (ref 13.0–17.0)
MCH: 32.6 pg (ref 26.0–34.0)
MCHC: 34 g/dL (ref 30.0–36.0)
MCV: 95.6 fL (ref 80.0–100.0)
Platelets: 308 10*3/uL (ref 150–400)
RBC: 3.44 MIL/uL — ABNORMAL LOW (ref 4.22–5.81)
RDW: 15 % (ref 11.5–15.5)
WBC: 7.9 10*3/uL (ref 4.0–10.5)
nRBC: 0 % (ref 0.0–0.2)

## 2020-12-24 LAB — GLUCOSE, CAPILLARY
Glucose-Capillary: 210 mg/dL — ABNORMAL HIGH (ref 70–99)
Glucose-Capillary: 136 mg/dL — ABNORMAL HIGH (ref 70–99)
Glucose-Capillary: 283 mg/dL — ABNORMAL HIGH (ref 70–99)
Glucose-Capillary: 213 mg/dL — ABNORMAL HIGH (ref 70–99)
Glucose-Capillary: 211 mg/dL — ABNORMAL HIGH (ref 70–99)

## 2020-12-24 MED ORDER — POTASSIUM CHLORIDE CRYS ER 20 MEQ PO TBCR
40.0000 meq | EXTENDED_RELEASE_TABLET | Freq: Once | ORAL | Status: AC
  Administered 2020-12-24: 40 meq via ORAL
  Filled 2020-12-24: qty 2

## 2020-12-24 NOTE — Progress Notes (Signed)
Results for AYDIN, HINK (MRN [id number]) as of 12/24/2020 12:14  Ref. Range 12/23/2020 16:49 12/23/2020 21:09 12/24/2020 01:17 12/24/2020 06:49 12/24/2020 11:22  Glucose-Capillary Latest Ref Range: 70 - 99 mg/dL 300 (H) 296 (H) 210 (H) 136 (H) 283 (H)  Noted that CBGs continue to be greater than 180 mg/dl.   Recommend changing Novolog correction scale to TID (0-9 units), HS scale (0-5 units), discontinue 0200 coverage. Increase Lantus to 15 units daily if blood sugars continue to be elevated.   Harvel Ricks RN BSN CDE Diabetes Coordinator Pager: [phone]  8am-5pm

## 2020-12-24 NOTE — Plan of Care (Signed)

## 2020-12-24 NOTE — Progress Notes (Signed)
PROGRESS NOTE    Robert Dickerson  AUQ:[id number] DOB: [birth year] DOA: 12/22/2020 PCP: Lavone Orn, MD   Chief Complaint  Patient presents with  . Leg Pain  Brief Narrative: 80 year old man with chronic diastolic CHF leg swelling, hypertension OSA, chronic pancreatitis, T2DM CKD stage IV followed by [place] [facility] morbid obesity presented with increasing right lower extremity pain and after he woke up in the morning with throbbing pain 12/22/2020. In the ED he was afebrile but tachycardic tachypneic with leukocytosis, lactic acidosis hypokalemia hyperglycemia, and elevated creatinine.  Subjective: Seen and examined this morning patient reports he is right lower extremity is not painful but the redness is still present.  Still swollen.  Overall feels much improved.  Assessment & Plan: Sepsis POA with right lower extremity cellulitis: Patient had lactic acid tachycardia tachypnea and leukocytosis.  Tenderness has improved but still has erythema and swelling.  Continue current broad-spectrum antibiotics  vancomycin IV Rocephin.Blood culture no growth so far.  Leukocytosis resolved. Hemodynamically stable. Recent Labs  Lab 12/22/20 1957 12/22/20 2022 12/23/20 0026 12/23/20 0315 12/23/20 1456 12/24/20 0122  WBC 15.8*  --   --  10.5  --  7.9  LATICACIDVEN  --  3.2* 3.2* 2.2* 2.3*  --    Chronic diastolic CHF with chronic leg edema:He is on Lasix 80 mg BID. Currently on hold 2/2 AKI.  Hypertension:BP is controlled on home amlodipine.   Chronic pain on chronic prednisone 5 mg taking it for many years.  Resumed it  Severe hypokalemia repleting again orally.Cont hom 20 meq daily too Recent Labs  Lab 12/22/20 1957 12/23/20 0315 12/23/20 1456 12/24/20 0122  K 2.8* 2.4* 3.2* 3.4*   AKI on CKD stage IV versus progression of his CKD followed by [place] [facility] creatinine now at 2.0,  CUT DOWN IVF and stop later today.previously were 2.0, 5 years ago.Holding NSAIDs and Lasix for  now. Recent Labs    12/22/20 1957 12/23/20 0315 12/23/20 1456 12/24/20 0122  BUN 72* 68* 61* 55*  CREATININE 2.46* 2.41* 2.20* 2.03*   Morbid obesity will benefit with weight loss OSA on CPAP  Diet Order            Diet Heart Room service appropriate? Yes; Fluid consistency: Thin  Diet effective now                 Patient's Body mass index is 32.23 kg/m.  DVT prophylaxis: enoxaparin (LOVENOX) injection 30 mg Start: 12/23/20 1000 Code Status:   Code Status: Full Code  Family Communication: plan of care discussed with patient at bedside.  Status is: Inpatient  Remains inpatient appropriate because:IV treatments appropriate due to intensity of illness or inability to take PO and Inpatient level of care appropriate due to severity of illness   Dispo: The patient is from: Home              Anticipated d/c is to: Home              Patient currently is not medically stable to d/c.   Difficult to place patient No  Unresulted Labs (From admission, onward)          Start     Ordered   12/24/20 6389  Basic metabolic panel  Daily,   R     Question:  Specimen collection method  Answer:  Lab=Lab collect   12/23/20 1201   12/24/20 0500  CBC  Daily,   R     Question:  Specimen collection method  Answer:  Lab=Lab collect   12/23/20 1201        Medications reviewed: Scheduled Meds: . allopurinol  300 mg Oral Daily  . amLODipine  10 mg Oral Daily  . enoxaparin (LOVENOX) injection  30 mg Subcutaneous Daily  . insulin aspart  0-9 Units Subcutaneous TID PC,HS,0200  . insulin glargine  10 Units Subcutaneous Daily  . loratadine  10 mg Oral QHS  . potassium chloride SA  20 mEq Oral Daily  . predniSONE  5 mg Oral Q breakfast  . pregabalin  75 mg Oral BID   Continuous Infusions: . sodium chloride 100 mL/hr at 12/23/20 1653  . cefTRIAXone (ROCEPHIN)  IV 1 g (12/24/20 0536)  . [START ON 12/25/2020] vancomycin      Consultants:see note  Procedures:see  note  Antimicrobials: Anti-infectives (From admission, onward)   Start     Dose/Rate Route Frequency Ordered Stop   12/25/20 0600  vancomycin (VANCOREADY) IVPB 1500 mg/300 mL        1,500 mg 150 mL/hr over 120 Minutes Intravenous Every 48 hours 12/23/20 0228     12/23/20 0045  cefTRIAXone (ROCEPHIN) 1 g in sodium chloride 0.9 % 100 mL IVPB        1 g 200 mL/hr over 30 Minutes Intravenous Daily 12/23/20 0035     12/22/20 2315  vancomycin (VANCOREADY) IVPB 2000 mg/400 mL        2,000 mg 200 mL/hr over 120 Minutes Intravenous  Once 12/22/20 2302 12/23/20 0145     Culture/Microbiology    Component Value Date/Time   SDES BLOOD LEFT ARM 12/22/2020 1946   SPECREQUEST  12/22/2020 1946    BOTTLES DRAWN AEROBIC ONLY Blood Culture results may not be optimal due to an excessive volume of blood received in culture bottles   CULT  12/22/2020 1946    NO GROWTH < 12 HOURS Performed at [facility], [place] [address], [place], [place] [address]    REPTSTATUS PENDING 12/22/2020 1946    Other culture-see note  Objective: Vitals: Today's Vitals   12/23/20 2115 12/23/20 2200 12/24/20 0529 12/24/20 0808  BP:   134/73 101/75  Pulse:   80 88  Resp:   18 17  Temp:   97.7 F (36.5 C) 97.7 F (36.5 C)  TempSrc:   Oral Oral  SpO2:   98% 100%  Weight:      Height:      PainSc: 10-Worst pain ever Asleep  5     Intake/Output Summary (Last 24 hours) at 12/24/2020 0832 Last data filed at 12/23/2020 2300 Gross per 24 hour  Intake 720.47 ml  Output --  Net 720.47 ml   Filed Weights   12/22/20 2245  Weight: 99 kg   Weight change:   Intake/Output from previous day: 05/24 0701 - 05/25 0700 In: 720.5 [P.O.:120; I.V.:600.5] Out: -  Intake/Output this shift: No intake/output data recorded. Filed Weights   12/22/20 2245  Weight: 99 kg    Examination: General exam: AAO X3 , older than stated age, weak appearing. HEENT:Oral mucosa moist, Ear/Nose WNL grossly,dentition  normal. Respiratory system: bilaterally diminished,NO CRACKLES, no use of accessory muscle, non tender. Cardiovascular sys no evidence of tem: S1 & S2 +,M0, No JVD. Gastrointestinal system: Abdomen soft, NT,ND, BS+. Nervous System:Alert, awake, moving extremities Extremities: Bilateral lower leg edema more on the right no tenderness mild erythema on the right lower extremity edema, distal peripheral pulses palpable.  Skin: No rashes,no icterus. MSK: Normal muscle bulk,tone, power  Data Reviewed: I have personally reviewed following labs and imaging studies CBC: Recent Labs  Lab 12/22/20 1957 12/23/20 0315 12/24/20 0122  WBC 15.8* 10.5 7.9  NEUTROABS 13.3*  --   --   HGB 13.2 12.1* 11.2*  HCT 39.0 35.5* 32.9*  MCV 95.6 94.9 95.6  PLT 398 323 096   Basic Metabolic Panel: Recent Labs  Lab 12/22/20 1957 12/23/20 0315 12/23/20 1456 12/24/20 0122  NA 130* 132* 133* 135  K 2.8* 2.4* 3.2* 3.4*  CL 86* 90* 91* 101  CO2 25 28 27 23   GLUCOSE 296* 241* 233* 222*  BUN 72* 68* 61* 55*  CREATININE 2.46* 2.41* 2.20* 2.03*  CALCIUM 8.4* 7.9* 8.2* 8.0*  MG  --  2.0 2.0  --    GFR: Estimated Creatinine Clearance: 34.8 mL/min (A) (by C-G formula based on SCr of 2.03 mg/dL (H)). Liver Function Tests: No results for input(s): AST, ALT, ALKPHOS, BILITOT, PROT, ALBUMIN in the last 168 hours. No results for input(s): LIPASE, AMYLASE in the last 168 hours. No results for input(s): AMMONIA in the last 168 hours. Coagulation Profile: No results for input(s): INR, PROTIME in the last 168 hours. Cardiac Enzymes: No results for input(s): CKTOTAL, CKMB, CKMBINDEX, TROPONINI in the last 168 hours. BNP (last 3 results) No results for input(s): PROBNP in the last 8760 hours. HbA1C: Recent Labs    12/23/20 0315  HGBA1C 9.5*   CBG: Recent Labs  Lab 12/23/20 1309 12/23/20 1649 12/23/20 2109 12/24/20 0117 12/24/20 0649  GLUCAP 254* 300* 296* 210* 136*   Lipid Profile: No results for  input(s): CHOL, HDL, LDLCALC, TRIG, CHOLHDL, LDLDIRECT in the last 72 hours. Thyroid Function Tests: No results for input(s): TSH, T4TOTAL, FREET4, T3FREE, THYROIDAB in the last 72 hours. Anemia Panel: No results for input(s): VITAMINB12, FOLATE, FERRITIN, TIBC, IRON, RETICCTPCT in the last 72 hours. Sepsis Labs: Recent Labs  Lab 12/22/20 2022 12/23/20 0026 12/23/20 0315 12/23/20 1456  LATICACIDVEN 3.2* 3.2* 2.2* 2.3*    Recent Results (from the past 240 hour(s))  Culture, blood (routine x 2)     Status: None (Preliminary result)   Collection Time: 12/22/20  7:30 PM   Specimen: BLOOD RIGHT ARM  Result Value Ref Range Status   Specimen Description BLOOD RIGHT ARM  Final   Special Requests   Final    BOTTLES DRAWN AEROBIC AND ANAEROBIC Blood Culture adequate volume   Culture   Final    NO GROWTH < 12 HOURS Performed at [facility], [place] [address], [place], [place] [address]    Report Status PENDING  Incomplete  Culture, blood (routine x 2)     Status: None (Preliminary result)   Collection Time: 12/22/20  7:46 PM   Specimen: BLOOD LEFT ARM  Result Value Ref Range Status   Specimen Description BLOOD LEFT ARM  Final   Special Requests   Final    BOTTLES DRAWN AEROBIC ONLY Blood Culture results may not be optimal due to an excessive volume of blood received in culture bottles   Culture   Final    NO GROWTH < 12 HOURS Performed at [facility], [place] [address], [place], [place] [address]    Report Status PENDING  Incomplete  Resp Panel by RT-PCR (Flu A&B, Covid) Nasopharyngeal Swab     Status: None   Collection Time: 12/22/20 11:56 PM   Specimen: Nasopharyngeal Swab; Nasopharyngeal(NP) swabs in vial transport medium  Result Value Ref Range Status   SARS  Coronavirus 2 by RT PCR NEGATIVE NEGATIVE Final    Comment: (NOTE) SARS-CoV-2 target nucleic acids are NOT DETECTED.  The SARS-CoV-2 RNA is generally detectable in upper respiratory specimens during the acute  phase of infection. The lowest concentration of SARS-CoV-2 viral copies this assay can detect is 138 copies/mL. A negative result does not preclude SARS-Cov-2 infection and should not be used as the sole basis for treatment or other patient management decisions. A negative result may occur with  improper specimen collection/handling, submission of specimen other than nasopharyngeal swab, presence of viral mutation(s) within the areas targeted by this assay, and inadequate number of viral copies(<138 copies/mL). A negative result must be combined with clinical observations, patient history, and epidemiological information. The expected result is Negative.  Fact Sheet for Patients:  EntrepreneurPulse.com.au  Fact Sheet for Healthcare Providers:  IncredibleEmployment.be  This test is no t yet approved or cleared by the [place] FDA and  has been authorized for detection and/or diagnosis of SARS-CoV-2 by FDA under an Emergency Use Authorization (EUA). This EUA will remain  in effect (meaning this test can be used) for the duration of the COVID-19 declaration under Section 564(b)(1) of the Act, 21 U.S.C.section 360bbb-3(b)(1), unless the authorization is terminated  or revoked sooner.       Influenza A by PCR NEGATIVE NEGATIVE Final   Influenza B by PCR NEGATIVE NEGATIVE Final    Comment: (NOTE) The Xpert Xpress SARS-CoV-2/FLU/RSV plus assay is intended as an aid in the diagnosis of influenza from Nasopharyngeal swab specimens and should not be used as a sole basis for treatment. Nasal washings and aspirates are unacceptable for Xpert Xpress SARS-CoV-2/FLU/RSV testing.  Fact Sheet for Patients: EntrepreneurPulse.com.au  Fact Sheet for Healthcare Providers: IncredibleEmployment.be  This test is not yet approved or cleared by the [place] FDA and has been authorized for detection and/or diagnosis of  SARS-CoV-2 by FDA under an Emergency Use Authorization (EUA). This EUA will remain in effect (meaning this test can be used) for the duration of the COVID-19 declaration under Section 564(b)(1) of the Act, 21 U.S.C. section 360bbb-3(b)(1), unless the authorization is terminated or revoked.  Performed at [facility], [place] [address] [place], [place] [address]      Radiology Studies: No results found.   LOS: 1 day   Antonieta Pert, MD [facility]  12/24/2020, 8:32 AM

## 2020-12-24 NOTE — Progress Notes (Signed)
Pt refusing CPAP for tonight. Pt states he does use CPAP at home, but feels he is okay without it for now. Advised pt to notify for RT should he change his mind.

## 2020-12-25 ENCOUNTER — Inpatient Hospital Stay (HOSPITAL_COMMUNITY): Payer: Medicare Other

## 2020-12-25 ENCOUNTER — Other Ambulatory Visit (HOSPITAL_COMMUNITY): Payer: Self-pay

## 2020-12-25 DIAGNOSIS — L0390 Cellulitis, unspecified: Secondary | ICD-10-CM

## 2020-12-25 DIAGNOSIS — L03115 Cellulitis of right lower limb: Secondary | ICD-10-CM | POA: Diagnosis not present

## 2020-12-25 LAB — BASIC METABOLIC PANEL
Anion gap: 9 (ref 5–15)
BUN: 39 mg/dL — ABNORMAL HIGH (ref 8–23)
CO2: 24 mmol/L (ref 22–32)
Calcium: 8.3 mg/dL — ABNORMAL LOW (ref 8.9–10.3)
Chloride: 104 mmol/L (ref 98–111)
Creatinine, Ser: 1.67 mg/dL — ABNORMAL HIGH (ref 0.61–1.24)
GFR, Estimated: 42 mL/min — ABNORMAL LOW (ref >60)
Glucose, Bld: 128 mg/dL — ABNORMAL HIGH (ref 70–99)
Potassium: 3.6 mmol/L (ref 3.5–5.1)
Sodium: 137 mmol/L (ref 135–145)

## 2020-12-25 LAB — CBC
HCT: 32.9 % — ABNORMAL LOW (ref 39.0–52.0)
Hemoglobin: 10.5 g/dL — ABNORMAL LOW (ref 13.0–17.0)
MCH: 31.6 pg (ref 26.0–34.0)
MCHC: 31.9 g/dL (ref 30.0–36.0)
MCV: 99.1 fL (ref 80.0–100.0)
Platelets: 332 10*3/uL (ref 150–400)
RBC: 3.32 MIL/uL — ABNORMAL LOW (ref 4.22–5.81)
RDW: 15.1 % (ref 11.5–15.5)
WBC: 7.8 10*3/uL (ref 4.0–10.5)
nRBC: 0 % (ref 0.0–0.2)

## 2020-12-25 LAB — GLUCOSE, CAPILLARY
Glucose-Capillary: 134 mg/dL — ABNORMAL HIGH (ref 70–99)
Glucose-Capillary: 123 mg/dL — ABNORMAL HIGH (ref 70–99)
Glucose-Capillary: 173 mg/dL — ABNORMAL HIGH (ref 70–99)
Glucose-Capillary: 205 mg/dL — ABNORMAL HIGH (ref 70–99)

## 2020-12-25 MED ORDER — CEPHALEXIN 500 MG PO CAPS
500.0000 mg | ORAL_CAPSULE | Freq: Four times a day (QID) | ORAL | 0 refills | Status: AC
  Filled 2020-12-25: qty 20, 5d supply, fill #0

## 2020-12-25 MED ORDER — BLOOD GLUCOSE MONITOR KIT
PACK | 0 refills | Status: DC
  Filled 2020-12-25: qty 1, fill #0

## 2020-12-25 MED ORDER — INSULIN STARTER KIT- PEN NEEDLES (ENGLISH)
1.0000 | Freq: Once | Status: AC
  Administered 2020-12-25: 1
  Filled 2020-12-25: qty 1

## 2020-12-25 MED ORDER — VANCOMYCIN HCL 750 MG/150ML IV SOLN: 750.0000 mg | INTRAVENOUS | Status: DC

## 2020-12-25 MED ORDER — PENTIPS 32G X 4 MM MISC
1.0000 | Freq: Every day | 0 refills | Status: AC
  Filled 2020-12-25: qty 100, 30d supply, fill #0

## 2020-12-25 MED ORDER — BLOOD GLUCOSE MONITOR KIT: PACK | 0 refills | Status: DC

## 2020-12-25 MED ORDER — POTASSIUM CHLORIDE CRYS ER 20 MEQ PO TBCR
20.0000 meq | EXTENDED_RELEASE_TABLET | Freq: Two times a day (BID) | ORAL | 0 refills | Status: DC
Start: 2020-12-25 — End: 2021-01-08
  Filled 2020-12-25: qty 14, 7d supply, fill #0

## 2020-12-25 MED ORDER — INSULIN GLARGINE 100 UNIT/ML SOLOSTAR PEN
10.0000 [IU] | PEN_INJECTOR | Freq: Every day | SUBCUTANEOUS | 0 refills | Status: DC
  Filled 2020-12-25: qty 3, 28d supply, fill #0

## 2020-12-25 MED ORDER — LIVING WELL WITH DIABETES BOOK
Freq: Once | Status: AC
  Filled 2020-12-25: qty 1

## 2020-12-25 MED ORDER — ACCU-CHEK GUIDE VI STRP
ORAL_STRIP | 0 refills | Status: DC
  Filled 2020-12-25: qty 100, 25d supply, fill #0

## 2020-12-25 NOTE — Progress Notes (Signed)
Lower extremity venous bilateral study completed.  Please see CVProc for preliminary results.  Sakeena Teall, RDMS, RVT

## 2020-12-25 NOTE — Plan of Care (Signed)
  RD consulted for nutrition education regarding diabetes.   Lab Results  Component Value Date   HGBA1C 9.5 (H) 12/23/2020   Case discussed with RN and DM coordinator. Pt to discharge home today on insulin.   Spoke with pt and daughter at bedside. Pt reports that he used to follow a "hearty country diet" up until about 2 years ago and has made significant changes, including drinking mostly water and adding more fruits and vegetables.   RD also provided referral for outpatient diabetes education through [facility] Nutrition and Diabetes Education Services for further reinforcement.   RD provided "Carbohydrate Counting for People with Diabetes" handout from the Academy of Nutrition and Dietetics. Discussed different food groups and their effects on blood sugar, emphasizing carbohydrate-containing foods. Provided list of carbohydrates and recommended serving sizes of common foods.  Discussed importance of controlled and consistent carbohydrate intake throughout the day. Provided examples of ways to balance meals/snacks and encouraged intake of high-fiber, whole grain complex carbohydrates. Teach back method used.  Expect fair to good compliance.  Current diet order is heart healthy, patient is consuming approximately 100% of meals at this time. Labs and medications reviewed. No further nutrition interventions warranted at this time. RD contact information provided. If additional nutrition issues arise, please re-consult RD.  Loistine Chance, RD, LDN, [place] Registered Dietitian II Certified Diabetes Care and Education Specialist Please refer to [facility] for RD and/or RD on-call/weekend/after hours pager

## 2020-12-25 NOTE — Progress Notes (Signed)
Provided discharge education/instructions, all questions and concerns addressed, Pt not in distress. Pt was seen by dietician and diabetes coordinator, meds delivered to room by [facility] pharmacy. Discharged home with belongings accompanied by daughter.

## 2020-12-25 NOTE — Discharge Summary (Signed)
Physician Discharge Summary  Robert Dickerson SNK:[id number] DOB: 10/16/1940 DOA: 12/22/2020  PCP: Lavone Orn, MD  Admit date: 12/22/2020 Discharge date: 12/25/2020  Admitted From: home Disposition:  home  Recommendations for Outpatient Follow-up:  1. Follow up with PCP in 1-2 weeks 2. Please obtain BMP/CBC in one week 3. Check blood sugar 4 times a day at home and follow-up with PCP 4. You will need to follow-up with [facility]. 5. Please follow up on the following pending results:  Home Health:no  Equipment/Devices: none  Discharge Condition: Stable Code Status:   Code Status: Full Code Diet recommendation:  Diet Order            Diet Carb Modified           Diet Heart Room service appropriate? Yes; Fluid consistency: Thin  Diet effective now               Brief/Interim Summary: 80 year old man with chronic diastolic CHF leg swelling, hypertension OSA, chronic pancreatitis, T2DM CKD stage IV followed by [place] [facility] morbid obesity presented with increasing right lower extremity pain and after he woke up in the morning with throbbing pain 12/22/2020. In the ED he was afebrile but tachycardic tachypneic with leukocytosis, lactic acidosis hypokalemia hyperglycemia,and elevated creatinine. Patient was admitted for sepsis due to lower extremity cellulitis.  Patient pain is significantly improved, treated with vancomycin ceftriaxone blood cultures no growth so far patient was found to have uncontrolled hemoglobin A1c and hyperglycemia history and Lantus.  He had borderline diabetes previously, now with diabetes mellitus likely type II in the setting of chronic steroid use. His leukocytosis has resolved at this time medically stable ambulating and would like to go home  Discharge Diagnoses:   Sepsis POA with right lower extremity cellulitis: Patient had lactic acid tachycardia tachypnea and leukocytosis.  Tenderness has improved erythema better, has swelling but  chronic, will obtain DVT studies to rule out prior to discharge.  He wants to go home.  Will change to oral Keflex upon discharge blood culture negative so far.   Recent Labs  Lab 12/22/20 1957 12/22/20 2022 12/23/20 0026 12/23/20 0315 12/23/20 1456 12/24/20 0122 12/25/20 0253  WBC 15.8*  --   --  10.5  --  7.9 7.8  LATICACIDVEN  --  3.2* 3.2* 2.2* 2.3*  --   --    Chronic diastolic CHF with chronic leg edema:He is on Lasix 80 mg BID he will resume upon discharge  Hypertension:BP is controlled on home amlodipine.   Chronic pain on chronic prednisone 5 mg taking it for many years.  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  Blood sugar Improved on insulin.  He will go on Lantus 10 units insulin, insulin education hypoglycemia education provided. Recent Labs  Lab 12/24/20 1122 12/24/20 1619 12/24/20 2021 12/25/20 0204 12/25/20 0647  GLUCAP 283* 213* 211* 134* 123*   Lab Results  Component Value Date   HGBA1C 9.5 (H) 12/23/2020   Severe hypokalemia repleted and resolved.  He will increase his potassium chloride 20 meq BID Recent Labs  Lab 12/22/20 1957 12/23/20 0315 12/23/20 1456 12/24/20 0122 12/25/20 0253  K 2.8* 2.4* 3.2* 3.4* 3.6   AKI on CKD stage IV versus progression of his CKD followed by [facility] , creatinine has improved to 1.6 off ivf. Previously were 2.0, 5 years ago.Holding NSAIDs. Check BMP in am. Recent Labs    12/22/20 1957 12/23/20  9833 12/23/20 1456 12/24/20 0122 12/25/20 0253  BUN 72* 68* 61* 55* 39*  CREATININE 2.46* 2.41* 2.20* 2.03* 1.67*   Morbid obesity will benefit with weight loss.  OSA on CPAP  Consults:  none  Subjective: Alert and oriented, wants to go home, no pain, has swelling in lower extremities and chronic.  Discharge Exam: Vitals:   12/25/20 0557 12/25/20 0719  BP: (!) 147/66 130/67  Pulse: 95 88  Resp: 16 16  Temp: 98 F (36.7 C)  98.1 F (36.7 C)  SpO2: 97% 98%   General: Pt is alert, awake, not in acute distress Cardiovascular: RRR, S1/S2 +, no rubs, no gallops Respiratory: CTA bilaterally, no wheezing, no rhonchi Abdominal: Soft, NT, ND, bowel sounds + Extremities: no edema, no cyanosis  Discharge Instructions  Discharge Instructions    (HEART FAILURE PATIENTS) Call MD:  Anytime you have any of the following symptoms: 1) 3 pound weight gain in 24 hours or 5 pounds in 1 week 2) shortness of breath, with or without a dry hacking cough 3) swelling in the hands, feet or stomach 4) if you have to sleep on extra pillows at night in order to breathe.   Complete by: As directed    Amb Referral to Nutrition and Diabetic Education   Complete by: As directed    Diet Carb Modified   Complete by: As directed    Discharge instructions   Complete by: As directed    Please call call MD or return to ER for similar or worsening recurring problem that brought you to hospital or if any fever,nausea/vomiting,abdominal pain, uncontrolled pain, chest pain,  shortness of breath or any other alarming symptoms.  Please follow-up your doctor as instructed in a week time and call the office for appointment.  Check your blood sugar 4 times a day Check blood sugar 3 times a day and bedtime at home. If blood sugar running above 200 less than 70 please call your MD to adjust insulin. If blood sugars running less 100 do not use insulin and call MD. If you noticed signs and symptoms of hypoglycemia or low blood sugar like jitteriness, confusion, thirst, tremor, sweating- Check blood sugar, drink sugary drink/biscuits/sweets to increase sugar level and call MD or return to ER.   Please avoid alcohol, smoking, or any other illicit substance and maintain healthy habits including taking your regular medications as prescribed.  You were cared for by a hospitalist during your hospital stay. If you have any questions about your discharge  medications or the care you received while you were in the hospital after you are discharged, you can call the unit and ask to speak with the hospitalist on call if the hospitalist that took care of you is not available.  Once you are discharged, your primary care physician will handle any further medical issues. Please note that NO REFILLS for any discharge medications will be authorized once you are discharged, as it is imperative that you return to your primary care physician (or establish a relationship with a primary care physician if you do not have one) for your aftercare needs so that they can reassess your need for medications and monitor your lab values   Increase activity slowly   Complete by: As directed      Allergies as of 12/25/2020      Reactions   Ace Inhibitors Other (See Comments), Cough   Lisinopril Cough   Other reaction(s): Cough   Oxycodone Other (See Comments)   "  out of it"      Medication List    STOP taking these medications   naproxen sodium 220 MG tablet Commonly known as: ALEVE     TAKE these medications   acetaminophen 650 MG CR tablet Commonly known as: TYLENOL Take 650 mg by mouth 2 (two) times daily as needed for pain. Notes to patient: Resume home regimen   allopurinol 100 MG tablet Commonly known as: ZYLOPRIM Take 300 mg by mouth daily.   amLODipine 10 MG tablet Commonly known as: NORVASC Take 10 mg by mouth daily.   blood glucose meter kit and supplies Kit Dispense based on patient and insurance preference. Use up to four times daily as directed.   cephALEXin 500 MG capsule Commonly known as: KEFLEX Take 1 capsule (500 mg total) by mouth 4 (four) times daily for 5 days.   cetirizine 10 MG tablet Commonly known as: ZYRTEC Take 5 mg by mouth at bedtime. Notes to patient: Resume home regimen   econazole nitrate 1 % cream Apply 1 application topically daily as needed (rash). Notes to patient: Resume home regimen   furosemide 80 MG  tablet Commonly known as: LASIX Take 80 mg by mouth 2 (two) times daily. Notes to patient: Resume home regimen   Lantus SoloStar 100 UNIT/ML Solostar Pen Generic drug: insulin glargine Inject 10 Units into the skin daily.   Pentips 32G X 4 MM Misc Generic drug: Insulin Pen Needle use as directed   potassium chloride SA 20 MEQ tablet Commonly known as: KLOR-CON Take 1 tablet (20 mEq total) by mouth 2 (two) times daily for 14 doses. What changed: when to take this   predniSONE 5 MG tablet Commonly known as: DELTASONE Take 5 mg by mouth daily with breakfast.   pregabalin 75 MG capsule Commonly known as: LYRICA Take 75 mg by mouth 2 (two) times daily.   triamcinolone cream 0.5 % Commonly known as: KENALOG Apply 1 application topically daily as needed (rash). Notes to patient: Resume home regimen       Follow-up Information    Lavone Orn, MD Follow up in 1 week(s).   Specialty: Internal Medicine Why: Check CBC and BMP in 5 days Contact information: [address] [place] [address] [phone]        [facility] [place] Follow up in 1 week(s).   Contact information: [place] [address] [phone]              Allergies  Allergen Reactions  . Ace Inhibitors Other (See Comments) and Cough  . Lisinopril Cough    Other reaction(s): Cough  . Oxycodone Other (See Comments)    "out of it"     The results of significant diagnostics from this hospitalization (including imaging, microbiology, ancillary and laboratory) are listed below for reference.    Microbiology: Recent Results (from the past 240 hour(s))  Culture, blood (routine x 2)     Status: None (Preliminary result)   Collection Time: 12/22/20  7:30 PM   Specimen: BLOOD RIGHT ARM  Result Value Ref Range Status   Specimen Description BLOOD RIGHT ARM  Final   Special Requests   Final    BOTTLES DRAWN AEROBIC AND ANAEROBIC Blood Culture adequate volume   Culture    Final    NO GROWTH 3 DAYS Performed at [facility], [address] [place], [place] [address]    Report Status PENDING  Incomplete  Culture, blood (routine x 2)  Status: None (Preliminary result)   Collection Time: 12/22/20  7:46 PM   Specimen: BLOOD LEFT ARM  Result Value Ref Range Status   Specimen Description BLOOD LEFT ARM  Final   Special Requests   Final    BOTTLES DRAWN AEROBIC ONLY Blood Culture results may not be optimal due to an excessive volume of blood received in culture bottles   Culture   Final    NO GROWTH 3 DAYS Performed at [facility], [place] [address] [place], [place] [address]    Report Status PENDING  Incomplete  Resp Panel by RT-PCR (Flu A&B, Covid) Nasopharyngeal Swab     Status: None   Collection Time: 12/22/20 11:56 PM   Specimen: Nasopharyngeal Swab; Nasopharyngeal(NP) swabs in vial transport medium  Result Value Ref Range Status   SARS Coronavirus 2 by RT PCR NEGATIVE NEGATIVE Final    Comment: (NOTE) SARS-CoV-2 target nucleic acids are NOT DETECTED.  The SARS-CoV-2 RNA is generally detectable in upper respiratory specimens during the acute phase of infection. The lowest concentration of SARS-CoV-2 viral copies this assay can detect is 138 copies/mL. A negative result does not preclude SARS-Cov-2 infection and should not be used as the sole basis for treatment or other patient management decisions. A negative result may occur with  improper specimen collection/handling, submission of specimen other than nasopharyngeal swab, presence of viral mutation(s) within the areas targeted by this assay, and inadequate number of viral copies(<138 copies/mL). A negative result must be combined with clinical observations, patient history, and epidemiological information. The expected result is Negative.  Fact Sheet for Patients:  EntrepreneurPulse.com.au  Fact Sheet for Healthcare Providers:   IncredibleEmployment.be  This test is no t yet approved or cleared by the [place] FDA and  has been authorized for detection and/or diagnosis of SARS-CoV-2 by FDA under an Emergency Use Authorization (EUA). This EUA will remain  in effect (meaning this test can be used) for the duration of the COVID-19 declaration under Section 564(b)(1) of the Act, 21 U.S.C.section 360bbb-3(b)(1), unless the authorization is terminated  or revoked sooner.       Influenza A by PCR NEGATIVE NEGATIVE Final   Influenza B by PCR NEGATIVE NEGATIVE Final    Comment: (NOTE) The Xpert Xpress SARS-CoV-2/FLU/RSV plus assay is intended as an aid in the diagnosis of influenza from Nasopharyngeal swab specimens and should not be used as a sole basis for treatment. Nasal washings and aspirates are unacceptable for Xpert Xpress SARS-CoV-2/FLU/RSV testing.  Fact Sheet for Patients: EntrepreneurPulse.com.au  Fact Sheet for Healthcare Providers: IncredibleEmployment.be  This test is not yet approved or cleared by the [place] FDA and has been authorized for detection and/or diagnosis of SARS-CoV-2 by FDA under an Emergency Use Authorization (EUA). This EUA will remain in effect (meaning this test can be used) for the duration of the COVID-19 declaration under Section 564(b)(1) of the Act, 21 U.S.C. section 360bbb-3(b)(1), unless the authorization is terminated or revoked.  Performed at [facility], [place] [address] [place], [place] [address]     Procedures/Studies: CT THORACIC SPINE WO CONTRAST  Result Date: 11/30/2020 CLINICAL DATA:  Back pain. Right hip pain. [place] in March of this year. EXAM: CT THORACIC AND LUMBAR SPINE WITHOUT CONTRAST TECHNIQUE: Multidetector CT imaging of the thoracic and lumbar spine was performed without contrast. Multiplanar CT image reconstructions were also generated. COMPARISON:  MRI 11/15/2020.  CT 03/12/2011.   CT 12/25/2007. FINDINGS: CT THORACIC SPINE FINDINGS Alignment: Normal Vertebrae: No  vertebral body fracture. Solid bridging osteophytes anteriorly from T7 through T10. Paraspinal and other soft tissues: Negative Disc levels: No significant disc level pathology throughout the thoracic region. No apparent compressive disc herniation or significant encroachment by facet arthritis. The canal and foramina appear sufficiently patent. There is a small calcified disc protrusion at T6-7 that indents the thecal sac slightly but should not cause neural compression. CT LUMBAR SPINE FINDINGS Segmentation: 5 lumbar type vertebral bodies as numbered previously. Alignment: Normal Vertebrae: Solid union from L2 to the sacrum. Pedicle screws and posterior rods from L2 through L4 without complicating feature. Screw shadows at the L5 level. Paraspinal and other soft tissues: Aortic atherosclerosis. No other finding. Disc levels: T12-L1: Disc degeneration with vacuum phenomenon. Mild bulging of the disc. No compressive stenosis. L1-2: Disc degeneration with vacuum phenomenon. Mild to moderate bulging of the disc. Mild facet and ligamentous hypertrophy. Moderate multifactorial spinal stenosis that could be symptomatic. L2 to sacrum: Previous discectomy and fusion procedure at L2-3. Sufficient patency of the canal and foramina. Previous discectomy, fusion and wide posterior decompression at L3-4. Sufficient patency of the canal and foramina. Previous discectomy, fusion wide posterior decompression at L4-5. Sufficient patency of the canal and foramina. L5-S1 is a transitional level with solid fusion and sufficient patency of the canal and foramina. IMPRESSION: CT THORACIC SPINE IMPRESSION Ordinary mild thoracic degenerative changes without significant narrowing of the canal or foramina. No traumatic finding. Solid anterior bridging osteophytes T7 through T11. CT LUMBAR SPINE IMPRESSION No acute or traumatic finding. Previous posterior  decompression, diskectomy and fusion from L2 to the sacrum. 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 Electronically Signed   By: Nelson Chimes M.D.   On: 11/30/2020 12:42   CT LUMBAR SPINE WO CONTRAST  Result Date: 11/30/2020 CLINICAL DATA:  Back pain. Right hip pain. [place] in March of this year. EXAM: CT THORACIC AND LUMBAR SPINE WITHOUT CONTRAST TECHNIQUE: Multidetector CT imaging of the thoracic and lumbar spine was performed without contrast. Multiplanar CT image reconstructions were also generated. COMPARISON:  MRI 11/15/2020.  CT 03/12/2011.  CT 12/25/2007. FINDINGS: CT THORACIC SPINE FINDINGS Alignment: Normal Vertebrae: No vertebral body fracture. Solid bridging osteophytes anteriorly from T7 through T10. Paraspinal and other soft tissues: Negative Disc levels: No significant disc level pathology throughout the thoracic region. No apparent compressive disc herniation or significant encroachment by facet arthritis. The canal and foramina appear sufficiently patent. There is a small calcified disc protrusion at T6-7 that indents the thecal sac slightly but should not cause neural compression. CT LUMBAR SPINE FINDINGS Segmentation: 5 lumbar type vertebral bodies as numbered previously. Alignment: Normal Vertebrae: Solid union from L2 to the sacrum. Pedicle screws and posterior rods from L2 through L4 without complicating feature. Screw shadows at the L5 level. Paraspinal and other soft tissues: Aortic atherosclerosis. No other finding. Disc levels: T12-L1: Disc degeneration with vacuum phenomenon. Mild bulging of the disc. No compressive stenosis. L1-2: Disc degeneration with vacuum phenomenon. Mild to moderate bulging of the disc. Mild facet and ligamentous hypertrophy. Moderate multifactorial spinal stenosis that could be symptomatic. L2 to sacrum: Previous discectomy and fusion procedure at L2-3. Sufficient patency of  the canal and foramina. Previous discectomy, fusion and wide posterior decompression at L3-4. Sufficient patency of the canal and foramina. Previous discectomy, fusion wide posterior decompression at L4-5. Sufficient patency of the canal and foramina. L5-S1 is a transitional level with solid fusion and sufficient patency of the  canal and foramina. IMPRESSION: CT THORACIC SPINE IMPRESSION Ordinary mild thoracic degenerative changes without significant narrowing of the canal or foramina. No traumatic finding. Solid anterior bridging osteophytes T7 through T11. CT LUMBAR SPINE IMPRESSION No acute or traumatic finding. Previous posterior decompression, diskectomy and fusion from L2 to the sacrum. 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 Electronically Signed   By: Nelson Chimes M.D.   On: 11/30/2020 12:42   VAS [place] LOWER EXTREMITY VENOUS (DVT)  Result Date: 12/25/2020  Lower Venous DVT Study Patient Name:  Robert Dickerson  Date of Exam:   12/25/2020 Medical Rec #: [id number]      Accession #:    [id number] Date of Birth: 01-26-1941     Patient Gender: M Patient Age:   79Y Exam Location:  [facility] Procedure:      VAS [place] LOWER EXTREMITY VENOUS (DVT) Referring Phys: [id number] [place] --------------------------------------------------------------------------------  Comparison Study: Previous 08/06/19 LT lower extremity venous was negative for                   DVT. Performing Technologist: Rogelia Rohrer RDMS, RVT  Examination Guidelines: A complete evaluation includes B-mode imaging, spectral Doppler, color Doppler, and power Doppler as needed of all accessible portions of each vessel. Bilateral testing is considered an integral part of a complete examination. Limited examinations for reoccurring indications may be performed as noted. The reflux portion of the exam is performed with the patient in reverse Trendelenburg.   +---------+---------------+---------+-----------+----------+--------------+ RIGHT    CompressibilityPhasicitySpontaneityPropertiesThrombus Aging +---------+---------------+---------+-----------+----------+--------------+ CFV      Full           Yes      Yes                                 +---------+---------------+---------+-----------+----------+--------------+ SFJ      Full                                                        +---------+---------------+---------+-----------+----------+--------------+ FV Prox  Full           Yes      Yes                                 +---------+---------------+---------+-----------+----------+--------------+ FV Mid   Full           Yes      Yes                                 +---------+---------------+---------+-----------+----------+--------------+ FV DistalFull           Yes      Yes                                 +---------+---------------+---------+-----------+----------+--------------+ PFV      Full                                                        +---------+---------------+---------+-----------+----------+--------------+  POP      Full           Yes      Yes                                 +---------+---------------+---------+-----------+----------+--------------+ PTV      Full                                                        +---------+---------------+---------+-----------+----------+--------------+ PERO     Full                                                        +---------+---------------+---------+-----------+----------+--------------+   +---------+---------------+---------+-----------+----------+--------------+ LEFT     CompressibilityPhasicitySpontaneityPropertiesThrombus Aging +---------+---------------+---------+-----------+----------+--------------+ CFV      Full           Yes      Yes                                  +---------+---------------+---------+-----------+----------+--------------+ SFJ      Full                                                        +---------+---------------+---------+-----------+----------+--------------+ FV Prox  Full           Yes      Yes                                 +---------+---------------+---------+-----------+----------+--------------+ FV Mid   Full           Yes      Yes                                 +---------+---------------+---------+-----------+----------+--------------+ FV DistalFull           Yes      Yes                                 +---------+---------------+---------+-----------+----------+--------------+ PFV      Full                                                        +---------+---------------+---------+-----------+----------+--------------+ POP      Full           Yes      Yes                                 +---------+---------------+---------+-----------+----------+--------------+ PTV  Full                                                        +---------+---------------+---------+-----------+----------+--------------+ PERO     Full                                                        +---------+---------------+---------+-----------+----------+--------------+    Summary: RIGHT: - There is no evidence of deep vein thrombosis in the lower extremity.  - No cystic structure found in the popliteal fossa.  LEFT: - There is no evidence of deep vein thrombosis in the lower extremity.  - No cystic structure found in the popliteal fossa.  *See table(s) above for measurements and observations.    Preliminary     Labs: BNP (last 3 results) No results for input(s): BNP in the last 8760 hours. Basic Metabolic Panel: Recent Labs  Lab 12/22/20 1957 12/23/20 0315 12/23/20 1456 12/24/20 0122 12/25/20 0253  NA 130* 132* 133* 135 137  K 2.8* 2.4* 3.2* 3.4* 3.6  CL 86* 90* 91* 101 104  CO2 _0 GLUCOSE 296* 241* 233* 222* 128*  BUN 72* 68* 61* 55* 39*  CREATININE 2.46* 2.41* 2.20* 2.03* 1.67*  CALCIUM 8.4* 7.9* 8.2* 8.0* 8.3*  MG  --  2.0 2.0  --   --    Liver Function Tests: No results for input(s): AST, ALT, ALKPHOS, BILITOT, PROT, ALBUMIN in the last 168 hours. No results for input(s): LIPASE, AMYLASE in the last 168 hours. No results for input(s): AMMONIA in the last 168 hours. CBC: Recent Labs  Lab 12/22/20 1957 12/23/20 0315 12/24/20 0122 12/25/20 0253  WBC 15.8* 10.5 7.9 7.8  NEUTROABS 13.3*  --   --   --   HGB 13.2 12.1* 11.2* 10.5*  HCT 39.0 35.5* 32.9* 32.9*  MCV 95.6 94.9 95.6 99.1  PLT 398 323 308 332   Cardiac Enzymes: No results for input(s): CKTOTAL, CKMB, CKMBINDEX, TROPONINI in the last 168 hours. BNP: Invalid input(s): POCBNP CBG: Recent Labs  Lab 12/24/20 1122 12/24/20 1619 12/24/20 2021 12/25/20 0204 12/25/20 0647  GLUCAP 283* 213* 211* 134* 123*   D-Dimer No results for input(s): DDIMER in the last 72 hours. Hgb A1c Recent Labs    12/23/20 0315  HGBA1C 9.5*   Lipid Profile No results for input(s): CHOL, HDL, LDLCALC, TRIG, CHOLHDL, LDLDIRECT in the last 72 hours. Thyroid function studies No results for input(s): TSH, T4TOTAL, T3FREE, THYROIDAB in the last 72 hours.  Invalid input(s): FREET3 Anemia work up No results for input(s): VITAMINB12, FOLATE, FERRITIN, TIBC, IRON, RETICCTPCT in the last 72 hours. Urinalysis    Component Value Date/Time   COLORURINE YELLOW 11/23/2015 1804   APPEARANCEUR CLEAR 11/23/2015 1804   LABSPEC 1.017 11/23/2015 1804   PHURINE 6.0 11/23/2015 1804   GLUCOSEU NEGATIVE 11/23/2015 1804   HGBUR NEGATIVE 11/23/2015 1804   BILIRUBINUR NEGATIVE 11/23/2015 1804   KETONESUR NEGATIVE 11/23/2015 1804   PROTEINUR NEGATIVE 11/23/2015 1804   UROBILINOGEN 0.2 03/15/2009 0921   NITRITE NEGATIVE 11/23/2015 1804   LEUKOCYTESUR NEGATIVE 11/23/2015 1804   Sepsis Labs Invalid input(s): PROCALCITONIN,  WBC,  [place] Micro[facility] Recent Results (from the past 240 hour(s))  Culture, blood (routine x 2)     Status: None (Preliminary result)   Collection Time: 12/22/20  7:30 PM   Specimen: BLOOD RIGHT ARM  Result Value Ref Range Status   Specimen Description BLOOD RIGHT ARM  Final   Special Requests   Final    BOTTLES DRAWN AEROBIC AND ANAEROBIC Blood Culture adequate volume   Culture   Final    NO GROWTH 3 DAYS Performed at [facility], [address] [place], [place] [address]    Report Status PENDING  Incomplete  Culture, blood (routine x 2)     Status: None (Preliminary result)   Collection Time: 12/22/20  7:46 PM   Specimen: BLOOD LEFT ARM  Result Value Ref Range Status   Specimen Description BLOOD LEFT ARM  Final   Special Requests   Final    BOTTLES DRAWN AEROBIC ONLY Blood Culture results may not be optimal due to an excessive volume of blood received in culture bottles   Culture   Final    NO GROWTH 3 DAYS Performed at [facility], [place] [address], [place], [place] [address]    Report Status PENDING  Incomplete  Resp Panel by RT-PCR (Flu A&B, Covid) Nasopharyngeal Swab     Status: None   Collection Time: 12/22/20 11:56 PM   Specimen: Nasopharyngeal Swab; Nasopharyngeal(NP) swabs in vial transport medium  Result Value Ref Range Status   SARS Coronavirus 2 by RT PCR NEGATIVE NEGATIVE Final    Comment: (NOTE) SARS-CoV-2 target nucleic acids are NOT DETECTED.  The SARS-CoV-2 RNA is generally detectable in upper respiratory specimens during the acute phase of infection. The lowest concentration of SARS-CoV-2 viral copies this assay can detect is 138 copies/mL. A negative result does not preclude SARS-Cov-2 infection and should not be used as the sole basis for treatment or other patient management decisions. A negative result may occur with  improper specimen collection/handling, submission of specimen other than nasopharyngeal swab, presence of viral  mutation(s) within the areas targeted by this assay, and inadequate number of viral copies(<138 copies/mL). A negative result must be combined with clinical observations, patient history, and epidemiological information. The expected result is Negative.  Fact Sheet for Patients:  EntrepreneurPulse.com.au  Fact Sheet for Healthcare Providers:  IncredibleEmployment.be  This test is no t yet approved or cleared by the [place] FDA and  has been authorized for detection and/or diagnosis of SARS-CoV-2 by FDA under an Emergency Use Authorization (EUA). This EUA will remain  in effect (meaning this test can be used) for the duration of the COVID-19 declaration under Section 564(b)(1) of the Act, 21 U.S.C.section 360bbb-3(b)(1), unless the authorization is terminated  or revoked sooner.       Influenza A by PCR NEGATIVE NEGATIVE Final   Influenza B by PCR NEGATIVE NEGATIVE Final    Comment: (NOTE) The Xpert Xpress SARS-CoV-2/FLU/RSV plus assay is intended as an aid in the diagnosis of influenza from Nasopharyngeal swab specimens and should not be used as a sole basis for treatment. Nasal washings and aspirates are unacceptable for Xpert Xpress SARS-CoV-2/FLU/RSV testing.  Fact Sheet for Patients: EntrepreneurPulse.com.au  Fact Sheet for Healthcare Providers: IncredibleEmployment.be  This test is not yet approved or cleared by the [place] FDA and has been authorized for detection and/or diagnosis of SARS-CoV-2 by FDA under an Emergency Use Authorization (EUA). This EUA will remain in effect (meaning this test can be used)  for the duration of the COVID-19 declaration under Section 564(b)(1) of the Act, 21 U.S.C. section 360bbb-3(b)(1), unless the authorization is terminated or revoked.  Performed at [facility], [place] [address], [place], [place] [address]      Time coordinating discharge:  35 minutes  SIGNED: Antonieta Pert, MD  [facility] 12/25/2020, 3:09 PM  If 7PM-7AM, please contact night-coverage [email or web address]

## 2020-12-25 NOTE — Plan of Care (Signed)

## 2020-12-25 NOTE — Progress Notes (Addendum)
Pharmacy Antibiotic Note  Robert Dickerson is a 80 y.o. male admitted on 12/22/2020 with RLE cellulitis/sepsis.  Pharmacy has been consulted for Vancomycin dosing.    Ordered 1500mg  Q48hr just given at 5/26 8am.  Renal function improved. ClCr ~42 ml/min.   5/26 Vancomycin 750mg  Q 24 hr Scr used: 1.67 mg/dL Weight: 99 kg Vd coeff: 0.5 L/kg Est AUC: 437  Plan: Adjust vancomycin to 750mg  Q12hr starting 5/28  Monitor cultures, clinical status, renal fx, vanc level if continuing Narrow abx as able and f/u duration    Height: 5\' 9"  (175.3 cm) Weight: 99 kg (218 lb 4.1 oz) IBW/kg (Calculated) : 70.7  Temp (24hrs), Avg:98.1 F (36.7 C), Min:97.8 F (36.6 C), Max:98.3 F (36.8 C)  Recent Labs  Lab 12/22/20 1957 12/22/20 2022 12/23/20 0026 12/23/20 0315 12/23/20 1456 12/24/20 0122 12/25/20 0253  WBC 15.8*  --   --  10.5  --  7.9 7.8  CREATININE 2.46*  --   --  2.41* 2.20* 2.03* 1.67*  LATICACIDVEN  --  3.2* 3.2* 2.2* 2.3*  --   --     Estimated Creatinine Clearance: 42.3 mL/min (A) (by C-G formula based on SCr of 1.67 mg/dL (H)).    Allergies  Allergen Reactions  . Ace Inhibitors Other (See Comments) and Cough  . Lisinopril Cough    Other reaction(s): Cough  . Oxycodone Other (See Comments)    "out of it"    Antimicrobials: Vanc 5/23>> Ceftriaxone 5/24>>  Microbiology Bcx 5/23: ngtd  Benetta Spar, PharmD, BCPS, [facility] Clinical Pharmacist  Please check AMION for all [place] phone numbers After 10:00 PM, call [place] [phone]

## 2020-12-25 NOTE — Progress Notes (Signed)
Instructed patient and daughter how to use an insulin pen. Discussed when to give injection, reviewed steps for giving insulin, patient gave feedback sufficiently on how to use the insulin pen. Discussed timing of insulin, treating hypoglycemia, and to follow up with PCP next week. Daughter was videoing the instructions while going over the steps.   Harvel Ricks RN BSN CDE Diabetes Coordinator Pager: [phone]  8am-5pm

## 2020-12-27 LAB — CULTURE, BLOOD (ROUTINE X 2)
Culture: NO GROWTH
Culture: NO GROWTH
Special Requests: ADEQUATE

## 2020-12-30 ENCOUNTER — Other Ambulatory Visit: Payer: Self-pay | Admitting: Neurosurgery

## 2020-12-30 DIAGNOSIS — I129 Hypertensive chronic kidney disease with stage 1 through stage 4 chronic kidney disease, or unspecified chronic kidney disease: Secondary | ICD-10-CM | POA: Diagnosis not present

## 2021-01-02 ENCOUNTER — Telehealth: Payer: Self-pay

## 2021-01-02 DIAGNOSIS — N184 Chronic kidney disease, stage 4 (severe): Secondary | ICD-10-CM | POA: Diagnosis not present

## 2021-01-02 NOTE — Telephone Encounter (Signed)
   [facility] Pre-operative Risk Assessment    Patient Name: Robert Dickerson  DOB: 19-May-1941  MRN: [id number]   HEARTCARE STAFF: - Please ensure there is not already an duplicate clearance open for this procedure.   Request for surgical clearance:  1. What type of surgery is being performed Lumbar Fusion   2. When is this surgery scheduled 01/22/2021  3. What type of clearance is required  Medical  4. Are there any medications that need to be held prior to surgery and how long  None  5. Practice name and name of physician performing surgery [facility]  Dr.Jonathan Thomas   6. What is the office phone number [phone]   7.   What is the office fax number [phone]  8.   Anesthesia type Not listed   Kathyrn Lass 01/02/2021, 1:49 PM  _________________________________________________________________   (provider comments below)

## 2021-01-02 NOTE — Telephone Encounter (Signed)
   Name: Robert Dickerson  DOB: [birth year]  MRN: [id number]  Primary Cardiologist: Sanda Klein, MD  Chart reviewed as part of pre-operative protocol coverage.  [name] is a new patient to our office and  will require a office visit in order to better assess preoperative cardiovascular risk.  Appointment has been scheduled for 01/08/21. Appointment notes already updated. Will route to Dr. Sallyanne Kuster as an Juluis Rainier.   Will route to requesting party that clearance will be addressed at upcoming appointment and remove from preop pool.  Loel Dubonnet, NP  01/02/2021, 2:49 PM

## 2021-01-06 DIAGNOSIS — I129 Hypertensive chronic kidney disease with stage 1 through stage 4 chronic kidney disease, or unspecified chronic kidney disease: Secondary | ICD-10-CM | POA: Diagnosis not present

## 2021-01-06 DIAGNOSIS — E1140 Type 2 diabetes mellitus with diabetic neuropathy, unspecified: Secondary | ICD-10-CM | POA: Diagnosis not present

## 2021-01-06 DIAGNOSIS — M1A00X0 Idiopathic chronic gout, unspecified site, without tophus (tophi): Secondary | ICD-10-CM | POA: Diagnosis not present

## 2021-01-06 DIAGNOSIS — E1159 Type 2 diabetes mellitus with other circulatory complications: Secondary | ICD-10-CM | POA: Diagnosis not present

## 2021-01-06 DIAGNOSIS — M353 Polymyalgia rheumatica: Secondary | ICD-10-CM | POA: Diagnosis not present

## 2021-01-06 DIAGNOSIS — I5030 Unspecified diastolic (congestive) heart failure: Secondary | ICD-10-CM | POA: Diagnosis not present

## 2021-01-06 DIAGNOSIS — L03115 Cellulitis of right lower limb: Secondary | ICD-10-CM | POA: Diagnosis not present

## 2021-01-06 DIAGNOSIS — M48061 Spinal stenosis, lumbar region without neurogenic claudication: Secondary | ICD-10-CM | POA: Diagnosis not present

## 2021-01-06 DIAGNOSIS — Z794 Long term (current) use of insulin: Secondary | ICD-10-CM | POA: Diagnosis not present

## 2021-01-06 DIAGNOSIS — N1832 Chronic kidney disease, stage 3b: Secondary | ICD-10-CM | POA: Diagnosis not present

## 2021-01-08 ENCOUNTER — Other Ambulatory Visit: Payer: Self-pay

## 2021-01-08 ENCOUNTER — Ambulatory Visit (INDEPENDENT_AMBULATORY_CARE_PROVIDER_SITE_OTHER): Payer: Medicare Other | Admitting: Cardiovascular Disease

## 2021-01-08 VITALS — BP 132/80 | HR 89 | Ht 70.0 in | Wt 222.0 lb

## 2021-01-08 DIAGNOSIS — N184 Chronic kidney disease, stage 4 (severe): Secondary | ICD-10-CM

## 2021-01-08 DIAGNOSIS — Z01810 Encounter for preprocedural cardiovascular examination: Secondary | ICD-10-CM | POA: Insufficient documentation

## 2021-01-08 DIAGNOSIS — I10 Essential (primary) hypertension: Secondary | ICD-10-CM

## 2021-01-08 DIAGNOSIS — E1140 Type 2 diabetes mellitus with diabetic neuropathy, unspecified: Secondary | ICD-10-CM

## 2021-01-08 DIAGNOSIS — Z7952 Long term (current) use of systemic steroids: Secondary | ICD-10-CM | POA: Insufficient documentation

## 2021-01-08 DIAGNOSIS — I5032 Chronic diastolic (congestive) heart failure: Secondary | ICD-10-CM

## 2021-01-08 DIAGNOSIS — E1165 Type 2 diabetes mellitus with hyperglycemia: Secondary | ICD-10-CM | POA: Diagnosis not present

## 2021-01-08 DIAGNOSIS — IMO0002 Reserved for concepts with insufficient information to code with codable children: Secondary | ICD-10-CM

## 2021-01-08 NOTE — Patient Instructions (Signed)

## 2021-01-08 NOTE — Progress Notes (Signed)
Cardiology Office Note:    Date:  01/08/2021   ID:  Robert Dickerson, DOB 1941/03/08, MRN [id number]  PCP:  Lavone Orn, MD   [facility] Providers Cardiologist:  Sanda Klein, MD     Referring MD: Lavone Orn, MD   No chief complaint on file. Robert Dickerson is a 80 y.o. male who is being seen today for the evaluation of Preop risk evaluation at the request of Lavone Orn, MD.   History of Present Illness:    Robert Dickerson is a 80 y.o. 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 Marcello Moores on June 23.  He does not have a history of coronary disease or peripheral arterial disease.  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  Glycemic control was previously fair with a hemoglobin A1c of 7.7% in January 2022, recently deteriorated to 9.5% in May 2022.  He also has significant CKD stage IV with a most recent creatinine of 2.2 and a history of gout.  He has NYHA functional class II exertional dyspnea, but his back pain limits him in equal amount as his dyspnea.  He does not become short of breath until he finishes climbing about a flight of stairs and does not have shortness of breath during routine activities of daily living  He denies chest discomfort either at rest or with activity.  He does not have dizziness, syncope or palpitations and has never had focal neurological events that would suggest stroke or TIA.  He was diagnosed with congestive heart failure in 2015 when his echocardiogram showed the EF 50-55% and "grade 1 diastolic dysfunction".  Frequent PACs were noted on that report.  A nuclear stress test performed in April 2016 did not  show perfusion abnormalities.  He has never had cardiac catheterization.  Past Medical History:  Diagnosis Date   Arthritis    all joints   Chronic kidney disease, stage 3 (HCC)    Congestive heart failure with LV diastolic dysfunction, NYHA class 2 (HCC)    GERD (gastroesophageal reflux disease)    Hypertension    Hypertensive chronic kidney disease    Sleep apnea    uses cpap, pt does not know settings    Past Surgical History:  Procedure Laterality Date   APPENDECTOMY     BACK SURGERY     4 surg, lower   CHOLECYSTECTOMY     COLONOSCOPY WITH PROPOFOL N/A 06/11/2014   Procedure: COLONOSCOPY WITH PROPOFOL;  Surgeon: Garlan Fair, MD;  Location: [facility];  Service: Endoscopy;  Laterality: N/A;   ESOPHAGOGASTRODUODENOSCOPY  08/26/2011   Procedure: ESOPHAGOGASTRODUODENOSCOPY (EGD);  Surgeon: Garlan Fair, MD;  Location: [facility];  Service: Endoscopy;  Laterality: N/A;   ESOPHAGOGASTRODUODENOSCOPY (EGD) WITH PROPOFOL N/A 06/11/2014   Procedure: ESOPHAGOGASTRODUODENOSCOPY (EGD) WITH PROPOFOL;  Surgeon: Garlan Fair, MD;  Location: [facility];  Service: Endoscopy;  Laterality: N/A;   EUS  09/15/2011   Procedure: UPPER ENDOSCOPIC ULTRASOUND (EUS) LINEAR;  Surgeon: Landry Dyke, MD;  Location: [facility];  Service: Endoscopy;  Laterality: N/A;  MAC   KNEE ARTHROSCOPY Right YEARS AGO    Current Medications: Current Meds  Medication Sig   ACCU-CHEK GUIDE test strip USE TO CHECK BLOOD  SUGAR AS DIRECTED UP TO FOUR TIMES DAILY   Accu-Chek Softclix Lancets lancets SMARTSIG:Topical 1-4 Times Daily   acetaminophen (TYLENOL) 650 MG CR tablet Take 650 mg by mouth 2 (two) times daily as needed for pain.   allopurinol (ZYLOPRIM) 100 MG tablet Take 300 mg by mouth daily.   amLODipine (NORVASC) 10 MG tablet Take 10 mg by mouth daily.   blood glucose meter kit and supplies KIT Dispense based on patient and insurance preference. Use up to four times daily as directed.    cetirizine (ZYRTEC) 10 MG tablet Take 5 mg by mouth at bedtime.    econazole nitrate 1 % cream Apply 1 application topically daily as needed (rash).    furosemide (LASIX) 80 MG tablet Take 80 mg by mouth. 1 Tablet Daily   insulin glargine (LANTUS) 100 UNIT/ML Solostar Pen Inject 10 Units into the skin daily.   Insulin Pen Needle (PENTIPS) 32G X 4 MM MISC use as directed   potassium chloride (KLOR-CON) 20 MEQ packet Take 20 mEq by mouth daily. 1 Tablet Daily   predniSONE (DELTASONE) 5 MG tablet Take 5 mg by mouth daily with breakfast.   pregabalin (LYRICA) 75 MG capsule Take 75 mg by mouth 2 (two) times daily.   triamcinolone cream (KENALOG) 0.5 % Apply 1 application topically daily as needed (rash).     Allergies:   Ace inhibitors, Lisinopril, and Oxycodone   Social History   Socioeconomic History   Marital status: Married    Spouse name: Not on file   Number of children: Not on file   Years of education: Not on file   Highest education level: Not on file  Occupational History   Not on file  Tobacco Use   Smoking status: Former    Packs/day: 2.00    Years: 20.00    Pack years: 40.00    Types: Cigarettes    Quit date: 08/02/1982    Years since quitting: 38.4   Smokeless tobacco: Never  Substance and Sexual Activity   Alcohol use: No   Drug use: No   Sexual activity: Not on file  Other Topics Concern   Not on file  Social History Narrative   Not on file   Social Determinants of Health   Financial Resource Strain: Not on file  Food Insecurity: Not on file  Transportation Needs: Not on file  Physical Activity: Not on file  Stress: Not on file  Social Connections: Not on file     Family History: The patient's family history is negative for Anesthesia problems, Hypotension, Malignant hyperthermia, and Pseudochol deficiency.  ROS:   Please see the history of present illness.     All other systems reviewed and are negative.  EKGs/Labs/Other Studies Reviewed:    The  following studies were reviewed today:  Nuclear stress test 11/22/2014  Impression Exercise Capacity:  Fair exercise capacity. BP Response:  Normal blood pressure response. Clinical Symptoms:  No chest pain. ECG Impression:  No significant ST segment change suggestive of ischemia. Comparison with Prior Nuclear Study: No previous nuclear study performed   Overall Impression:  Normal stress nuclear study. No EKG changes of ischemia. No chest pain.   LV Ejection Fraction: 57%.  LV Wall Motion:  NL LV Function; NL Wall Motion    ECHO 09/04/2013  - Left ventricle: The cavity size was normal. Wall thickness    was normal. Systolic function was low normal to mildly    reduced. The estimated ejection fraction was in  the range    of 50% to 55%. Wall motion was normal; there were no    regional wall motion abnormalities. Doppler parameters are    consistent with abnormal left ventricular relaxation    (grade 1 diastolic dysfunction).  - Aortic valve: There was no stenosis. Trivial    regurgitation.  - Aorta: Mildly dilated aortic root.  - Mitral valve: Mildly calcified annulus. Mildly calcified    leaflets . Trivial regurgitation.  - Left atrium: The atrium was mildly dilated.  - Right ventricle: The cavity size was normal. Systolic    function was normal.  - Pulmonary arteries: No complete TR doppler jet so unable    to estimate PA systolic pressure.  EKG:  EKG is ordered today.  The ekg ordered today demonstrates sinus rhythm with PACs, nonspecific T wave changes, QTC 418 ms  Recent Labs: 12/23/2020: Magnesium 2.0 12/25/2020: BUN 39; Creatinine, Ser 1.67; Hemoglobin 10.5; Platelets 332; Potassium 3.6; Sodium 137  Recent Lipid Panel    Component Value Date/Time   CHOL  07/09/2007 0500    151        ATP III CLASSIFICATION:  <200     mg/dL   Desirable  200-239  mg/dL   Borderline High  >=240    mg/dL   High   TRIG 191 (H) 07/09/2007 0500   HDL 19 (L) 07/09/2007 0500   CHOLHDL  7.9 07/09/2007 0500   VLDL 38 07/09/2007 0500   LDLCALC  07/09/2007 0500    94        Total Cholesterol/HDL:CHD Risk Coronary Heart Disease Risk Table                     Men   Women  1/2 Average Risk   3.4   3.3    Risk Assessment/Calculations:       Physical Exam:    VS:  BP 132/80   Pulse 89   Ht _0  (1.778 m)   Wt 222 lb (100.7 kg)   SpO2 97%   BMI 31.85 kg/m     Wt Readings from Last 3 Encounters:  01/08/21 222 lb (100.7 kg)  12/22/20 218 lb 4.1 oz (99 kg)  01/30/20 219 lb (99.3 kg)     GEN: Obese, Well nourished, well developed in no acute distress HEENT: Normal NECK: No JVD; No carotid bruits LYMPHATICS: No lymphadenopathy CARDIAC: occasional ectopy on background of RRR, no murmurs, rubs, gallops RESPIRATORY:  Clear to auscultation without rales, wheezing or rhonchi  ABDOMEN: Soft, non-tender, non-distended MUSCULOSKELETAL:  No edema; No deformity  SKIN: Warm and dry, small healing ulcer R pretibial NEUROLOGIC:  Alert and oriented x 3 PSYCHIATRIC:  Normal affect   ASSESSMENT:    1. Chronic diastolic heart failure ([place])   2. Type 2 diabetes mellitus, uncontrolled, with neuropathy ([place])   3. Essential hypertension   4. CKD (chronic kidney disease) stage 4, GFR 15-29 ml/min (HCC)   5. Current chronic use of systemic steroids   6. Preoperative cardiovascular examination    PLAN:    In order of problems listed above:  CHF: Currently appears clinically euvolemic, limited as much by his back pain as he is by shortness of breath.  With decent functional status.  He has not required recent escalation in diuretic therapy, in fact his dose of diuretic was recently decreased.  Appears optimized for surgery. DM: Glycemic control has recently deteriorated.  Discussed the fact that is important to have good glycemic control  around the time of surgery to help healing and reduce the risk of infection.  His HDL cholesterol is fine, but his LDL cholesterol is borderline  high at 104.  Advised healthy diet and exercise after he has his back surgery.  May need to consider statin therapy if LDL remains above 100. HTN: Well treated. CKD4: Creatinine appears to be at baseline.  Followed by Dr. Pearson Grippe at [facility]. Chronic steroid therapy: It sounds to me like he has been on prednisone and low-dose without interruption for about 2 years now.  It is possible that he has adrenal suppression.  If he develops refractory hypotension, stress doses of steroids may be necessary during surgery or serious illness. Preop CV exam: The risk of major cardiovascular complications with back surgery is low.  I do not think any cardiac investigations or changes in treatment are necessary at this time.  Avoid excessive intravenous fluid administration.        Medication Adjustments/Labs and Tests Ordered: Current medicines are reviewed at length with the patient today.  Concerns regarding medicines are outlined above.  Orders Placed This Encounter  Procedures   EKG 12-Lead   No orders of the defined types were placed in this encounter.   Patient Instructions  Medication Instructions:  No changes *If you need a refill on your cardiac medications before your next appointment, please call your pharmacy*   Lab Work: None ordered If you have labs (blood work) drawn today and your tests are completely normal, you will receive your results only by: [place] (if you have MyChart) OR A paper copy in the mail If you have any lab test that is abnormal or we need to change your treatment, we will call you to review the results.   Testing/Procedures: None ordered   Follow-Up: At [facility], you and your health needs are our priority.  As part of our continuing mission to provide you with exceptional heart care, we have created designated Provider Care Teams.  These Care Teams include your primary Cardiologist (physician) and Advanced Practice Providers (APPs -   Physician Assistants and Nurse Practitioners) who all work together to provide you with the care you need, when you need it.  We recommend signing up for the patient portal called "MyChart".  Sign up information is provided on this After Visit Summary.  MyChart is used to connect with patients for Virtual Visits (Telemedicine).  Patients are able to view lab/test results, encounter notes, upcoming appointments, etc.  Non-urgent messages can be sent to your provider as well.   To learn more about what you can do with MyChart, go to [email or web address].    Your next appointment:   12 month(s)  The format for your next appointment:   In Person  Provider:   You may see Sanda Klein, MD or one of the following Advanced Practice Providers on your designated Care Team:   Almyra Deforest, PA-C Fabian Sharp, [place] or  Roby Lofts, PA-C     Signed, Sanda Klein, MD  01/08/2021 6:35 PM    [place] '

## 2021-01-12 DIAGNOSIS — E871 Hypo-osmolality and hyponatremia: Secondary | ICD-10-CM | POA: Diagnosis not present

## 2021-01-12 DIAGNOSIS — I509 Heart failure, unspecified: Secondary | ICD-10-CM | POA: Diagnosis not present

## 2021-01-12 DIAGNOSIS — N184 Chronic kidney disease, stage 4 (severe): Secondary | ICD-10-CM | POA: Diagnosis not present

## 2021-01-12 DIAGNOSIS — E1122 Type 2 diabetes mellitus with diabetic chronic kidney disease: Secondary | ICD-10-CM | POA: Diagnosis not present

## 2021-01-12 DIAGNOSIS — I129 Hypertensive chronic kidney disease with stage 1 through stage 4 chronic kidney disease, or unspecified chronic kidney disease: Secondary | ICD-10-CM | POA: Diagnosis not present

## 2021-01-12 DIAGNOSIS — E876 Hypokalemia: Secondary | ICD-10-CM | POA: Diagnosis not present

## 2021-01-19 NOTE — Pre-Procedure Instructions (Signed)
Surgical Instructions    Your procedure is scheduled on Thursday, June 23rd.  Report to [facility] Main Entrance "A" at 5:30 A.M., then check in with the Admitting office.  Call this number if you have problems the morning of surgery:  [phone]   If you have any questions prior to your surgery date call [phone]: Open Monday-Friday 8am-4pm    Remember:  Do not eat or drink after midnight the night before your surgery    Take these medicines the morning of surgery with A SIP OF WATER allopurinol (ZYLOPRIM) amLODipine (NORVASC)  predniSONE (DELTASONE)  pregabalin (LYRICA)  acetaminophen (TYLENOL)-as needed   As of today, STOP taking any Aspirin (unless otherwise instructed by your surgeon) Aleve, Naproxen, Ibuprofen, Motrin, Advil, Goody's, BC's, all herbal medications, fish oil, and all vitamins.  WHAT DO I DO ABOUT MY DIABETES MEDICATION?  THE MORNING OF SURGERY, take 5 units of insulin glargine (LANTUS) insulin.   HOW TO MANAGE YOUR DIABETES BEFORE AND AFTER SURGERY  Why is it important to control my blood sugar before and after surgery? Improving blood sugar levels before and after surgery helps healing and can limit problems. A way of improving blood sugar control is eating a healthy diet by:  Eating less sugar and carbohydrates  Increasing activity/exercise  Talking with your doctor about reaching your blood sugar goals High blood sugars (greater than 180 mg/dL) can raise your risk of infections and slow your recovery, so you will need to focus on controlling your diabetes during the weeks before surgery. Make sure that the doctor who takes care of your diabetes knows about your planned surgery including the date and location.  How do I manage my blood sugar before surgery? Check your blood sugar at least 4 times a day, starting 2 days before surgery, to make sure that the level is not too high or low.  Check your blood sugar the morning of your surgery when you  wake up and every 2 hours until you get to the Short Stay unit.  If your blood sugar is less than 70 mg/dL, you will need to treat for low blood sugar: Do not take insulin. Treat a low blood sugar (less than 70 mg/dL) with  cup of clear juice (cranberry or apple), 4 glucose tablets, OR glucose gel. Recheck blood sugar in 15 minutes after treatment (to make sure it is greater than 70 mg/dL). If your blood sugar is not greater than 70 mg/dL on recheck, call [phone] for further instructions. Report your blood sugar to the short stay nurse when you get to Short Stay.  If you are admitted to the hospital after surgery: Your blood sugar will be checked by the staff and you will probably be given insulin after surgery (instead of oral diabetes medicines) to make sure you have good blood sugar levels. The goal for blood sugar control after surgery is 80-180 mg/dL.                      Do NOT Smoke (Tobacco/Vaping) or drink Alcohol 24 hours prior to your procedure.  If you use a CPAP at night, you may bring all equipment for your overnight stay.   Contacts, glasses, piercing's, hearing aid's, dentures or partials may not be worn into surgery, please bring cases for these belongings.    For patients admitted to the hospital, discharge time will be determined by your treatment team.   Patients discharged the day of surgery will not be  allowed to drive home, and someone needs to stay with them for 24 hours.  ONLY 1 SUPPORT PERSON MAY BE PRESENT WHILE YOU ARE IN SURGERY. IF YOU ARE TO BE ADMITTED ONCE YOU ARE IN YOUR ROOM YOU WILL BE ALLOWED TWO (2) VISITORS.  Minor children may have two parents present. Special consideration for safety and communication needs will be reviewed on a case by case basis.   Special instructions:   [facility]- Preparing For Surgery  Before surgery, you can play an important role. Because skin is not sterile, your skin needs to be as free of germs as possible. You  can reduce the number of germs on your skin by washing with CHG (chlorahexidine gluconate) Soap before surgery.  CHG is an antiseptic cleaner which kills germs and bonds with the skin to continue killing germs even after washing.    Oral Hygiene is also important to reduce your risk of infection.  Remember - BRUSH YOUR TEETH THE MORNING OF SURGERY WITH YOUR REGULAR TOOTHPASTE  Please do not use if you have an allergy to CHG or antibacterial soaps. If your skin becomes reddened/irritated stop using the CHG.  Do not shave (including legs and underarms) for at least 48 hours prior to first CHG shower. It is OK to shave your face.  Please follow these instructions carefully.   Shower the NIGHT BEFORE SURGERY and the MORNING OF SURGERY  If you chose to wash your hair, wash your hair first as usual with your normal shampoo.  After you shampoo, rinse your hair and body thoroughly to remove the shampoo.  Use CHG Soap as you would any other liquid soap. You can apply CHG directly to the skin and wash gently with a scrungie or a clean washcloth.   Apply the CHG Soap to your body ONLY FROM THE NECK DOWN.  Do not use on open wounds or open sores. Avoid contact with your eyes, ears, mouth and genitals (private parts). Wash Face and genitals (private parts)  with your normal soap.   Wash thoroughly, paying special attention to the area where your surgery will be performed.  Thoroughly rinse your body with warm water from the neck down.  DO NOT shower/wash with your normal soap after using and rinsing off the CHG Soap.  Pat yourself dry with a CLEAN TOWEL.  Wear CLEAN PAJAMAS to bed the night before surgery  Place CLEAN SHEETS on your bed the night before your surgery  DO NOT SLEEP WITH PETS.   Day of Surgery: Shower with CHG soap. Do not wear jewelry. Do not wear lotions, powders, colognes, or deodorant. Men may shave face and neck. Do not bring valuables to the hospital. [facility] is not  responsible for any belongings or valuables. Wear Clean/Comfortable clothing the morning of surgery Remember to brush your teeth WITH YOUR REGULAR TOOTHPASTE.   Please read over the following fact sheets that you were given.

## 2021-01-20 ENCOUNTER — Encounter (HOSPITAL_COMMUNITY)
Admission: RE | Admit: 2021-01-20 | Discharge: 2021-01-20 | Disposition: A | Discharge status: Home / Self Care | Payer: Medicare Other | Source: Ambulatory Visit | Attending: Neurosurgery | Admitting: Neurosurgery

## 2021-01-20 ENCOUNTER — Encounter (HOSPITAL_COMMUNITY): Payer: Self-pay

## 2021-01-20 ENCOUNTER — Other Ambulatory Visit: Payer: Self-pay

## 2021-01-20 DIAGNOSIS — Z20822 Contact with and (suspected) exposure to covid-19: Secondary | ICD-10-CM | POA: Insufficient documentation

## 2021-01-20 DIAGNOSIS — Z01812 Encounter for preprocedural laboratory examination: Secondary | ICD-10-CM | POA: Insufficient documentation

## 2021-01-20 LAB — SURGICAL PCR SCREEN
MRSA, PCR: NEGATIVE
Staphylococcus aureus: POSITIVE — AB

## 2021-01-20 LAB — SARS CORONAVIRUS 2 (TAT 6-24 HRS): SARS Coronavirus 2: NEGATIVE

## 2021-01-20 LAB — TYPE AND SCREEN
ABO/RH(D): O NEG
Antibody Screen: NEGATIVE

## 2021-01-20 LAB — GLUCOSE, CAPILLARY: Glucose-Capillary: 191 mg/dL — ABNORMAL HIGH (ref 70–99)

## 2021-01-20 NOTE — Progress Notes (Signed)
PCP - Dr. Lavone Orn Cardiologist - Dr. Sallyanne Kuster  Chest x-ray - n/a EKG - 01/08/21 Stress Test - 2016 ECHO - 2015 Cardiac Cath - denies  Sleep Study - OSA+ CPAP - uses nightly  Fasting Blood Sugar -120-190  Checks Blood Sugar 2 times a day CBG in PAT 191. Last A1C 9.5 on 12/23/20  Blood Thinner Instructions: n/a Aspirin Instructions: n/a  COVID TEST- 01/20/21 in PAT  Anesthesia review: Yes, hx of CHF. Recently evaluated by cardiology  Patient denies shortness of breath, fever, cough and chest pain at PAT appointment   All instructions explained to the patient, with a verbal understanding of the material. Patient agrees to go over the instructions while at home for a better understanding. Patient also instructed to self quarantine after being tested for COVID-19. The opportunity to ask questions was provided.

## 2021-01-20 NOTE — Anesthesia Preprocedure Evaluation (Addendum)
Anesthesia Evaluation  Patient identified by MRN, date of birth, ID band Patient awake    Reviewed: Allergy & Precautions, H&P , NPO status , Patient's Chart, lab work & pertinent test results  Airway Mallampati: II   Neck ROM: full    Dental   Pulmonary sleep apnea , former smoker,    breath sounds clear to auscultation       Cardiovascular hypertension, +CHF   Rhythm:regular Rate:Normal     Neuro/Psych  Neuromuscular disease    GI/Hepatic GERD  ,  Endo/Other  diabetes, Type 2  Renal/GU Renal InsufficiencyRenal disease     Musculoskeletal  (+) Arthritis ,   Abdominal   Peds  Hematology   Anesthesia Other Findings   Reproductive/Obstetrics                            Anesthesia Physical Anesthesia Plan  ASA: 3  Anesthesia Plan: General   Post-op Pain Management:    Induction: Intravenous  PONV Risk Score and Plan: 2 and Ondansetron, Dexamethasone, Midazolam and Treatment may vary due to age or medical condition  Airway Management Planned: Oral ETT  Additional Equipment:   Intra-op Plan:   Post-operative Plan: Extubation in OR  Informed Consent: I have reviewed the patients History and Physical, chart, labs and discussed the procedure including the risks, benefits and alternatives for the proposed anesthesia with the patient or authorized representative who has indicated his/her understanding and acceptance.     Dental advisory given  Plan Discussed with: CRNA, Anesthesiologist and Surgeon  Anesthesia Plan Comments: (PAT note by Karoline Caldwell, PA-C: Patient seen by cardiologist Dr. Sallyanne Kuster on 01/08/2021 for preop evaluation.  Has a history of heart failure with preserved ejection fraction, IDDM2 (last A1c 9.5 on 12/23/2020), HTN, CKD 4, aortic atherosclerosis, OSA on CPAP.  Per note, "Preop CV exam:The risk of major cardiovascular complications with back surgery is low. I do not  think any cardiac investigations or changes in treatment are necessary at this time. Avoid excessive intravenous fluid administration."  Patient is on chronic prednisone 5 mg a day for chronic pain, has been on this for many years.  CKD4 is followed by Dr. Pearson Grippe [place] [facility].  Review of labs in epic shows baseline creatinine to be around 2.0.  BMP and CBC from 12/25/2020 reviewed.  Mild anemia with hemoglobin 10.5.  Creatinine elevated 1.67 consistent with history of CKD 4.  EKG 01/08/2021: Sinus rhythm with marked sinus arrhythmia first-degree AV block.  Rate 89.  Nonspecific T abnormality.  Nuclear stress 11/22/2014: Overall Impression: Normal stress nuclear study.No EKG changes of ischemia. No chest pain.  LV Ejection Fraction: 57%. LV Wall Motion: NL LV Function; NL Wall Motion  TTE 09/04/2013: - Left ventricle: The cavity size was normal. Wall thickness  was normal. Systolic function was low normal to mildly  reduced. The estimated ejection fraction was in the range  of 50% to 55%. Wall motion was normal; there were no  regional wall motion abnormalities. Doppler parameters are  consistent with abnormal left ventricular relaxation  (grade 1 diastolic dysfunction).  - Aortic valve: There was no stenosis. Trivial  regurgitation.  - Aorta: Mildly dilated aortic root.  - Mitral valve: Mildly calcified annulus. Mildly calcified  leaflets . Trivial regurgitation.  - Left atrium: The atrium was mildly dilated.  - Right ventricle: The cavity size was normal. Systolic  function was normal.  - Pulmonary arteries: No complete TR doppler jet  so unable  to estimate PA systolic pressure.  Impressions:   - Normal LV size with low normal to mildly decreased  systolic function, EF 16-57%. EF difficult to estimate due  to ?frequent PACs. Normal RV size and systolic function.  No significant valvular abnormalities. )       Anesthesia Quick  Evaluation

## 2021-01-20 NOTE — Progress Notes (Signed)
Anesthesia Chart Review:  Patient seen by cardiologist Dr. Sallyanne Kuster on 01/08/2021 for preop evaluation.  Has a history of heart failure with preserved ejection fraction, IDDM2 (last A1c 9.5 on 12/23/2020), HTN, CKD 4, aortic atherosclerosis, OSA on CPAP.  Per note, "Preop CV exam: The risk of major cardiovascular complications with back surgery is low.  I do not think any cardiac investigations or changes in treatment are necessary at this time.  Avoid excessive intravenous fluid administration."  Patient is on chronic prednisone 5 mg a day for chronic pain, has been on this for many years.  CKD4 is followed by Dr. Pearson Grippe [place] [facility].  Review of labs in epic shows baseline creatinine to be around 2.0.  BMP and CBC from 12/25/2020 reviewed.  Mild anemia with hemoglobin 10.5.  Creatinine elevated 1.67 consistent with history of CKD 4.  EKG 01/08/2021: Sinus rhythm with marked sinus arrhythmia first-degree AV block.  Rate 89.  Nonspecific T abnormality.  Nuclear stress 11/22/2014: Overall Impression:  Normal stress nuclear study. No EKG changes of ischemia. No chest pain.   LV Ejection Fraction: 57%.  LV Wall Motion:  NL LV Function; NL Wall Motion   TTE 09/04/2013: - Left ventricle: The cavity size was normal. Wall thickness    was normal. Systolic function was low normal to mildly    reduced. The estimated ejection fraction was in the range    of 50% to 55%. Wall motion was normal; there were no    regional wall motion abnormalities. Doppler parameters are    consistent with abnormal left ventricular relaxation    (grade 1 diastolic dysfunction).  - Aortic valve: There was no stenosis. Trivial    regurgitation.  - Aorta: Mildly dilated aortic root.  - Mitral valve: Mildly calcified annulus. Mildly calcified    leaflets . Trivial regurgitation.  - Left atrium: The atrium was mildly dilated.  - Right ventricle: The cavity size was normal. Systolic    function was normal.  - Pulmonary  arteries: No complete TR doppler jet so unable    to estimate PA systolic pressure.  Impressions:   - Normal LV size with low normal to mildly decreased    systolic function, EF 03-15%. EF difficult to estimate due    to ?frequent PACs. Normal RV size and systolic function.    No significant valvular abnormalities.   [facility] Short Stay Center/Anesthesiology Phone [phone] 01/20/2021 1:15 PM

## 2021-01-21 NOTE — H&P (Addendum)
CC: Back and leg pain   HPI:     Patient is a 80 y.o. male with history of multiple lumbar surgeries with L2-5 posterior decompression and fusion who developed worsening midline back pain that was worse with activity as well as bilateral leg pain, R>L for the past 6 months that has not responded to physical therapy and injections.  He is a former smoker.   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   Chronic kidney disease, stage 4 (severe) ([place]) 12/03/2020   Degeneration of lumbar intervertebral disc 12/03/2020   Type 2 diabetes mellitus, uncontrolled, with neuropathy ([place]) 12/03/2020   Diabetic peripheral neuropathy associated with type 2 diabetes mellitus ([place]) 42/59/5638   Diastolic heart failure ([place]) 12/03/2020   Enlarged prostate 12/03/2020   Erectile dysfunction due to arterial insufficiency 12/03/2020   Essential hypertension 12/03/2020   Gastro-esophageal reflux disease without esophagitis 12/03/2020   Hematuria 12/03/2020   Impaired fasting glucose 12/03/2020   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   Morbid obesity ([place]) 12/03/2020   Obstructive sleep apnea 12/03/2020   Osteoarthritis of hip 12/03/2020   Osteoarthritis of knee 12/03/2020   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   Restless legs 12/03/2020   Skin sensation disturbance 12/03/2020   Body mass index (BMI) 29.0-29.9, adult 11/21/2020   Lumbar stenosis 11/21/2020   Tendinitis 09/03/2020   Subacute arthropathy 09/03/2020   Bilateral hearing loss 08/11/2020   Impacted cerumen of left ear 03/10/2017   Excessive cerumen in  left ear canal 03/10/2017   Faintness 12/09/2015   Backache 11/14/2014   CHRONIC PANCREATITIS 06/16/2009   Past Medical History:  Diagnosis Date   Arthritis    all joints   Chronic kidney disease, stage 3 (HCC)    Congestive heart failure with LV diastolic dysfunction, NYHA class 2 (HCC)    GERD (gastroesophageal reflux disease)    Hypertension    Hypertensive chronic kidney disease    Sleep apnea    uses cpap, pt does not know settings    Past Surgical History:  Procedure Laterality Date   APPENDECTOMY     BACK SURGERY     4 surg, lower   CHOLECYSTECTOMY     COLONOSCOPY WITH PROPOFOL N/A 06/11/2014   Procedure: COLONOSCOPY WITH PROPOFOL;  Surgeon: Garlan Fair, MD;  Location: [facility] ENDOSCOPY;  Service: Endoscopy;  Laterality: N/A;   ESOPHAGOGASTRODUODENOSCOPY  08/26/2011   Procedure: ESOPHAGOGASTRODUODENOSCOPY (EGD);  Surgeon: Garlan Fair, MD;  Location: [facility];  Service: Endoscopy;  Laterality: N/A;   ESOPHAGOGASTRODUODENOSCOPY (EGD) WITH PROPOFOL N/A 06/11/2014   Procedure: ESOPHAGOGASTRODUODENOSCOPY (EGD) WITH PROPOFOL;  Surgeon: Garlan Fair, MD;  Location: [facility] ENDOSCOPY;  Service: Endoscopy;  Laterality: N/A;   EUS  09/15/2011   Procedure: UPPER ENDOSCOPIC ULTRASOUND (EUS) LINEAR;  Surgeon: Landry Dyke, MD;  Location: [facility] ENDOSCOPY;  Service: Endoscopy;  Laterality: N/A;  MAC   KNEE ARTHROSCOPY Right YEARS AGO    No medications prior to admission.   Allergies  Allergen Reactions   Ace Inhibitors Other (See Comments) and Cough   Lisinopril Cough    Other reaction(s): Cough   Oxycodone Other (See Comments)    "out of  it"     Social History   Tobacco Use   Smoking status: Former    Packs/day: 2.00    Years: 20.00    Pack years: 40.00    Types: Cigarettes    Quit date: 08/02/1982    Years since quitting: 38.4   Smokeless tobacco: Never  Substance Use Topics   Alcohol use: No    Family History  Problem Relation Age of Onset   Anesthesia  problems Neg Hx    Hypotension Neg Hx    Malignant hyperthermia Neg Hx    Pseudochol deficiency Neg Hx      Review of Systems Pertinent items noted in HPI and remainder of comprehensive ROS otherwise negative.  Objective:   No data found. No intake/output data recorded. No intake/output data recorded.      General : Alert, cooperative, no distress, appears stated age   Head:  Normocephalic/atraumatic    Eyes: PERRL, conjunctiva/corneas clear, EOM's intact. Fundi could not be visualized Neck: Supple Chest:  Respirations unlabored Chest wall: no tenderness or deformity Heart: Regular rate and rhythm Abdomen: Soft, nontender and nondistended Extremities: warm and well-perfused Skin: normal turgor, color and texture Neurologic:  Alert, oriented x 3.  Eyes open spontaneously. PERRL, EOMI, VFC, no facial droop. V1-3 intact.  No dysarthria, tongue protrusion symmetric.  CNII-XII intact. Normal strength, sensation and reflexes throughout.  No pronator drift, full strength in legs Well-healed midline back incision. Bilateral positive SLR       Data ReviewCBC:  Lab Results  Component Value Date   WBC 7.8 12/25/2020   RBC 3.32 (L) 12/25/2020   BMP:  Lab Results  Component Value Date   GLUCOSE 128 (H) 12/25/2020   CO2 24 12/25/2020   BUN 39 (H) 12/25/2020   CREATININE 1.67 (H) 12/25/2020   CALCIUM 8.3 (L) 12/25/2020   Radiology review:  CT and MRI shows fused L2-5 segments with adjacent segment dz at L1-2 with associated severe stenosis.  At T12-L1 he has disc disease with moderate stenosis.  Assessment:   Active Problems:   * No active hospital problems. 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  Plan:   - plan for L1-2 DLIF, possible T12-L1 DLIF and posterior arthrodesis - risks, benefits, alternatives, and expected convalescence were discussed with him.  Risks discussed included, but were not limited to, bleeding, pain, infection,  scarring, pseudoarthrosis, adjacent segment degeneration, neurologic deficit, injury to nearby organs, and death.  Informed consent was obtained.

## 2021-01-22 ENCOUNTER — Inpatient Hospital Stay (HOSPITAL_COMMUNITY): Payer: Medicare Other

## 2021-01-22 ENCOUNTER — Other Ambulatory Visit: Payer: Self-pay

## 2021-01-22 ENCOUNTER — Encounter (HOSPITAL_COMMUNITY): Payer: Self-pay

## 2021-01-22 ENCOUNTER — Inpatient Hospital Stay (HOSPITAL_COMMUNITY)
Admission: RE | Admit: 2021-01-22 | Discharge: 2021-01-23 | DRG: 460 | Disposition: A | Discharge status: Home / Self Care | Payer: Medicare Other | Attending: Neurosurgery | Admitting: Neurosurgery

## 2021-01-22 ENCOUNTER — Inpatient Hospital Stay (HOSPITAL_COMMUNITY): Payer: Medicare Other | Admitting: Certified Registered Nurse Anesthetist

## 2021-01-22 ENCOUNTER — Inpatient Hospital Stay (HOSPITAL_COMMUNITY): Payer: Medicare Other | Admitting: Vascular Surgery

## 2021-01-22 ENCOUNTER — Encounter (HOSPITAL_COMMUNITY)
Admission: RE | Disposition: A | Discharge status: Home / Self Care | Payer: Self-pay | Source: Home / Self Care | Attending: Neurosurgery

## 2021-01-22 DIAGNOSIS — E876 Hypokalemia: Secondary | ICD-10-CM | POA: Diagnosis not present

## 2021-01-22 DIAGNOSIS — N184 Chronic kidney disease, stage 4 (severe): Secondary | ICD-10-CM | POA: Diagnosis not present

## 2021-01-22 DIAGNOSIS — E1122 Type 2 diabetes mellitus with diabetic chronic kidney disease: Secondary | ICD-10-CM | POA: Diagnosis present

## 2021-01-22 DIAGNOSIS — M48061 Spinal stenosis, lumbar region without neurogenic claudication: Secondary | ICD-10-CM | POA: Diagnosis not present

## 2021-01-22 DIAGNOSIS — M4326 Fusion of spine, lumbar region: Secondary | ICD-10-CM | POA: Diagnosis not present

## 2021-01-22 DIAGNOSIS — M1710 Unilateral primary osteoarthritis, unspecified knee: Secondary | ICD-10-CM | POA: Diagnosis present

## 2021-01-22 DIAGNOSIS — G2581 Restless legs syndrome: Secondary | ICD-10-CM | POA: Diagnosis present

## 2021-01-22 DIAGNOSIS — Z981 Arthrodesis status: Secondary | ICD-10-CM

## 2021-01-22 DIAGNOSIS — M5416 Radiculopathy, lumbar region: Secondary | ICD-10-CM | POA: Diagnosis not present

## 2021-01-22 DIAGNOSIS — M353 Polymyalgia rheumatica: Secondary | ICD-10-CM | POA: Diagnosis present

## 2021-01-22 DIAGNOSIS — M1A9XX0 Chronic gout, unspecified, without tophus (tophi): Secondary | ICD-10-CM | POA: Diagnosis present

## 2021-01-22 DIAGNOSIS — M2578 Osteophyte, vertebrae: Secondary | ICD-10-CM | POA: Diagnosis not present

## 2021-01-22 DIAGNOSIS — M48062 Spinal stenosis, lumbar region with neurogenic claudication: Principal | ICD-10-CM | POA: Diagnosis present

## 2021-01-22 DIAGNOSIS — M5135 Other intervertebral disc degeneration, thoracolumbar region: Secondary | ICD-10-CM | POA: Diagnosis present

## 2021-01-22 DIAGNOSIS — Z419 Encounter for procedure for purposes other than remedying health state, unspecified: Secondary | ICD-10-CM

## 2021-01-22 DIAGNOSIS — Z87891 Personal history of nicotine dependence: Secondary | ICD-10-CM | POA: Diagnosis not present

## 2021-01-22 DIAGNOSIS — M1990 Unspecified osteoarthritis, unspecified site: Secondary | ICD-10-CM | POA: Diagnosis present

## 2021-01-22 DIAGNOSIS — Z7952 Long term (current) use of systemic steroids: Secondary | ICD-10-CM | POA: Diagnosis not present

## 2021-01-22 DIAGNOSIS — I130 Hypertensive heart and chronic kidney disease with heart failure and stage 1 through stage 4 chronic kidney disease, or unspecified chronic kidney disease: Secondary | ICD-10-CM | POA: Diagnosis present

## 2021-01-22 DIAGNOSIS — Z6832 Body mass index (BMI) 32.0-32.9, adult: Secondary | ICD-10-CM | POA: Diagnosis not present

## 2021-01-22 DIAGNOSIS — I5032 Chronic diastolic (congestive) heart failure: Secondary | ICD-10-CM | POA: Diagnosis not present

## 2021-01-22 DIAGNOSIS — Z20822 Contact with and (suspected) exposure to covid-19: Secondary | ICD-10-CM | POA: Diagnosis not present

## 2021-01-22 DIAGNOSIS — E1142 Type 2 diabetes mellitus with diabetic polyneuropathy: Secondary | ICD-10-CM | POA: Diagnosis present

## 2021-01-22 DIAGNOSIS — H9193 Unspecified hearing loss, bilateral: Secondary | ICD-10-CM | POA: Diagnosis present

## 2021-01-22 DIAGNOSIS — M1610 Unilateral primary osteoarthritis, unspecified hip: Secondary | ICD-10-CM | POA: Diagnosis present

## 2021-01-22 DIAGNOSIS — M5116 Intervertebral disc disorders with radiculopathy, lumbar region: Secondary | ICD-10-CM | POA: Diagnosis not present

## 2021-01-22 DIAGNOSIS — G4733 Obstructive sleep apnea (adult) (pediatric): Secondary | ICD-10-CM | POA: Diagnosis present

## 2021-01-22 DIAGNOSIS — K219 Gastro-esophageal reflux disease without esophagitis: Secondary | ICD-10-CM | POA: Diagnosis present

## 2021-01-22 HISTORY — PX: ANTERIOR LAT LUMBAR FUSION: SHX1168

## 2021-01-22 LAB — GLUCOSE, CAPILLARY
Glucose-Capillary: 152 mg/dL — ABNORMAL HIGH (ref 70–99)
Glucose-Capillary: 163 mg/dL — ABNORMAL HIGH (ref 70–99)
Glucose-Capillary: 262 mg/dL — ABNORMAL HIGH (ref 70–99)
Glucose-Capillary: 199 mg/dL — ABNORMAL HIGH (ref 70–99)

## 2021-01-22 SURGERY — ANTERIOR LATERAL LUMBAR FUSION 1 LEVEL
Anesthesia: General | Site: Flank | Laterality: Left

## 2021-01-22 MED ORDER — ONDANSETRON HCL 4 MG/2ML IJ SOLN: 4.0000 mg | Freq: Four times a day (QID) | INTRAMUSCULAR | Status: DC | PRN

## 2021-01-22 MED ORDER — DEXAMETHASONE SODIUM PHOSPHATE 10 MG/ML IJ SOLN
INTRAMUSCULAR | Status: AC
  Filled 2021-01-22: qty 1

## 2021-01-22 MED ORDER — ONDANSETRON HCL 4 MG PO TABS: 4.0000 mg | ORAL_TABLET | Freq: Four times a day (QID) | ORAL | Status: DC | PRN

## 2021-01-22 MED ORDER — CYCLOBENZAPRINE HCL 10 MG PO TABS
10.0000 mg | ORAL_TABLET | Freq: Three times a day (TID) | ORAL | Status: DC | PRN
  Administered 2021-01-22 (×2): 10 mg via ORAL
  Filled 2021-01-22 (×2): qty 1

## 2021-01-22 MED ORDER — HYDROMORPHONE HCL 1 MG/ML IJ SOLN
0.5000 mg | INTRAMUSCULAR | Status: DC | PRN
  Administered 2021-01-22: 0.5 mg via INTRAVENOUS
  Filled 2021-01-22: qty 0.5

## 2021-01-22 MED ORDER — FENTANYL CITRATE (PF) 250 MCG/5ML IJ SOLN
INTRAMUSCULAR | Status: AC
  Filled 2021-01-22: qty 5

## 2021-01-22 MED ORDER — LACTATED RINGERS IV SOLN: INTRAVENOUS | Status: DC

## 2021-01-22 MED ORDER — CHLORHEXIDINE GLUCONATE 0.12 % MT SOLN
15.0000 mL | Freq: Once | OROMUCOSAL | Status: AC
  Administered 2021-01-22: 15 mL via OROMUCOSAL
  Filled 2021-01-22: qty 15

## 2021-01-22 MED ORDER — FUROSEMIDE 40 MG PO TABS: 80.0000 mg | ORAL_TABLET | Freq: Every day | ORAL | Status: DC

## 2021-01-22 MED ORDER — FENTANYL CITRATE (PF) 250 MCG/5ML IJ SOLN
INTRAMUSCULAR | Status: DC | PRN
  Administered 2021-01-22: 100 ug via INTRAVENOUS
  Administered 2021-01-22: 50 ug via INTRAVENOUS

## 2021-01-22 MED ORDER — LIDOCAINE 2% (20 MG/ML) 5 ML SYRINGE
INTRAMUSCULAR | Status: DC | PRN
  Administered 2021-01-22: 80 mg via INTRAVENOUS

## 2021-01-22 MED ORDER — HYDROCODONE-ACETAMINOPHEN 5-325 MG PO TABS
2.0000 | ORAL_TABLET | ORAL | Status: DC | PRN
  Administered 2021-01-22 – 2021-01-23 (×5): 2 via ORAL
  Filled 2021-01-22 (×5): qty 2

## 2021-01-22 MED ORDER — POTASSIUM CHLORIDE 20 MEQ PO PACK
20.0000 meq | PACK | Freq: Every day | ORAL | Status: DC
  Administered 2021-01-22: 20 meq via ORAL
  Filled 2021-01-22 (×2): qty 1

## 2021-01-22 MED ORDER — ENOXAPARIN SODIUM 40 MG/0.4ML IJ SOSY
40.0000 mg | PREFILLED_SYRINGE | INTRAMUSCULAR | Status: DC
  Administered 2021-01-23: 40 mg via SUBCUTANEOUS
  Filled 2021-01-22: qty 0.4

## 2021-01-22 MED ORDER — POTASSIUM CHLORIDE IN NACL 20-0.9 MEQ/L-% IV SOLN: INTRAVENOUS | Status: DC

## 2021-01-22 MED ORDER — ORAL CARE MOUTH RINSE
15.0000 mL | Freq: Once | OROMUCOSAL | Status: AC
Start: 2021-01-22 — End: 2021-01-22

## 2021-01-22 MED ORDER — MENTHOL 3 MG MT LOZG: 1.0000 | LOZENGE | OROMUCOSAL | Status: DC | PRN

## 2021-01-22 MED ORDER — CHLORHEXIDINE GLUCONATE CLOTH 2 % EX PADS: 6.0000 | MEDICATED_PAD | Freq: Once | CUTANEOUS | Status: DC

## 2021-01-22 MED ORDER — INSULIN GLARGINE 100 UNIT/ML ~~LOC~~ SOLN
10.0000 [IU] | Freq: Every day | SUBCUTANEOUS | Status: DC
  Administered 2021-01-22 – 2021-01-23 (×2): 10 [IU] via SUBCUTANEOUS
  Filled 2021-01-22 (×2): qty 0.1

## 2021-01-22 MED ORDER — PREDNISONE 5 MG PO TABS
5.0000 mg | ORAL_TABLET | Freq: Every day | ORAL | Status: DC
  Administered 2021-01-23: 5 mg via ORAL
  Filled 2021-01-22: qty 1

## 2021-01-22 MED ORDER — PROPOFOL 1000 MG/100ML IV EMUL
INTRAVENOUS | Status: AC
  Filled 2021-01-22: qty 200

## 2021-01-22 MED ORDER — AMLODIPINE BESYLATE 5 MG PO TABS: 10.0000 mg | ORAL_TABLET | Freq: Every day | ORAL | Status: DC

## 2021-01-22 MED ORDER — ACETAMINOPHEN 650 MG RE SUPP: 650.0000 mg | RECTAL | Status: DC | PRN

## 2021-01-22 MED ORDER — DOCUSATE SODIUM 100 MG PO CAPS
100.0000 mg | ORAL_CAPSULE | Freq: Two times a day (BID) | ORAL | Status: DC
  Administered 2021-01-22 – 2021-01-23 (×2): 100 mg via ORAL
  Filled 2021-01-22 (×2): qty 1

## 2021-01-22 MED ORDER — CEFAZOLIN SODIUM-DEXTROSE 2-4 GM/100ML-% IV SOLN
2.0000 g | Freq: Three times a day (TID) | INTRAVENOUS | Status: AC
  Administered 2021-01-22 – 2021-01-23 (×3): 2 g via INTRAVENOUS
  Filled 2021-01-22 (×3): qty 100

## 2021-01-22 MED ORDER — 0.9 % SODIUM CHLORIDE (POUR BTL) OPTIME
TOPICAL | Status: DC | PRN
  Administered 2021-01-22: 1000 mL

## 2021-01-22 MED ORDER — THROMBIN 5000 UNITS EX SOLR
CUTANEOUS | Status: AC
  Filled 2021-01-22: qty 10000

## 2021-01-22 MED ORDER — FENTANYL CITRATE (PF) 100 MCG/2ML IJ SOLN
INTRAMUSCULAR | Status: AC
  Filled 2021-01-22: qty 2

## 2021-01-22 MED ORDER — SODIUM CHLORIDE 0.9% FLUSH: 3.0000 mL | Freq: Two times a day (BID) | INTRAVENOUS | Status: DC

## 2021-01-22 MED ORDER — CEFAZOLIN SODIUM-DEXTROSE 2-4 GM/100ML-% IV SOLN
2.0000 g | INTRAVENOUS | Status: AC
  Administered 2021-01-22: 2 g via INTRAVENOUS
  Filled 2021-01-22: qty 100

## 2021-01-22 MED ORDER — LIDOCAINE-EPINEPHRINE 1 %-1:100000 IJ SOLN
INTRAMUSCULAR | Status: DC | PRN
  Administered 2021-01-22: 10 mL

## 2021-01-22 MED ORDER — ONDANSETRON HCL 4 MG/2ML IJ SOLN
INTRAMUSCULAR | Status: AC
  Filled 2021-01-22: qty 2

## 2021-01-22 MED ORDER — HYDROCODONE-ACETAMINOPHEN 5-325 MG PO TABS: 1.0000 | ORAL_TABLET | ORAL | Status: DC | PRN

## 2021-01-22 MED ORDER — SODIUM CHLORIDE 0.9 % IV SOLN: 250.0000 mL | INTRAVENOUS | Status: DC

## 2021-01-22 MED ORDER — SUCCINYLCHOLINE CHLORIDE 200 MG/10ML IV SOSY
PREFILLED_SYRINGE | INTRAVENOUS | Status: AC
  Filled 2021-01-22: qty 10

## 2021-01-22 MED ORDER — PREGABALIN 75 MG PO CAPS
75.0000 mg | ORAL_CAPSULE | Freq: Two times a day (BID) | ORAL | Status: DC
  Administered 2021-01-22 – 2021-01-23 (×2): 75 mg via ORAL
  Filled 2021-01-22 (×2): qty 1

## 2021-01-22 MED ORDER — DEXAMETHASONE SODIUM PHOSPHATE 10 MG/ML IJ SOLN
INTRAMUSCULAR | Status: DC | PRN
  Administered 2021-01-22: 5 mg via INTRAVENOUS

## 2021-01-22 MED ORDER — ALLOPURINOL 300 MG PO TABS
300.0000 mg | ORAL_TABLET | Freq: Every day | ORAL | Status: DC
  Administered 2021-01-23: 300 mg via ORAL
  Filled 2021-01-22: qty 1

## 2021-01-22 MED ORDER — PROPOFOL 10 MG/ML IV BOLUS
INTRAVENOUS | Status: AC
  Filled 2021-01-22: qty 20

## 2021-01-22 MED ORDER — PROPOFOL 10 MG/ML IV BOLUS
INTRAVENOUS | Status: DC | PRN
  Administered 2021-01-22: 150 mg via INTRAVENOUS

## 2021-01-22 MED ORDER — SODIUM CHLORIDE 0.9% FLUSH: 3.0000 mL | INTRAVENOUS | Status: DC | PRN

## 2021-01-22 MED ORDER — INSULIN ASPART 100 UNIT/ML IJ SOLN
0.0000 [IU] | Freq: Three times a day (TID) | INTRAMUSCULAR | Status: DC
  Administered 2021-01-23: 3 [IU] via SUBCUTANEOUS

## 2021-01-22 MED ORDER — THROMBIN 5000 UNITS EX SOLR: OROMUCOSAL | Status: DC | PRN

## 2021-01-22 MED ORDER — LIDOCAINE 2% (20 MG/ML) 5 ML SYRINGE
INTRAMUSCULAR | Status: AC
  Filled 2021-01-22: qty 5

## 2021-01-22 MED ORDER — POLYETHYLENE GLYCOL 3350 17 G PO PACK: 17.0000 g | PACK | Freq: Every day | ORAL | Status: DC | PRN

## 2021-01-22 MED ORDER — PROPOFOL 500 MG/50ML IV EMUL
INTRAVENOUS | Status: DC | PRN
  Administered 2021-01-22: 50 ug/kg/min via INTRAVENOUS

## 2021-01-22 MED ORDER — ONDANSETRON HCL 4 MG/2ML IJ SOLN
INTRAMUSCULAR | Status: DC | PRN
  Administered 2021-01-22: 4 mg via INTRAVENOUS

## 2021-01-22 MED ORDER — SODIUM CHLORIDE 0.9 % IV SOLN
0.0125 ug/kg/min | INTRAVENOUS | Status: AC
  Administered 2021-01-22: .05 ug/kg/min via INTRAVENOUS
  Filled 2021-01-22: qty 2000

## 2021-01-22 MED ORDER — ROCURONIUM BROMIDE 10 MG/ML (PF) SYRINGE
PREFILLED_SYRINGE | INTRAVENOUS | Status: AC
  Filled 2021-01-22: qty 10

## 2021-01-22 MED ORDER — FENTANYL CITRATE (PF) 100 MCG/2ML IJ SOLN
25.0000 ug | INTRAMUSCULAR | Status: DC | PRN
  Administered 2021-01-22 (×2): 50 ug via INTRAVENOUS

## 2021-01-22 MED ORDER — PHENYLEPHRINE HCL-NACL 10-0.9 MG/250ML-% IV SOLN
INTRAVENOUS | Status: DC | PRN
  Administered 2021-01-22: 40 ug/min via INTRAVENOUS

## 2021-01-22 MED ORDER — PHENOL 1.4 % MT LIQD: 1.0000 | OROMUCOSAL | Status: DC | PRN

## 2021-01-22 MED ORDER — ECONAZOLE NITRATE 1 % EX CREA: 1.0000 "application " | TOPICAL_CREAM | Freq: Every day | CUTANEOUS | Status: DC | PRN

## 2021-01-22 MED ORDER — INSULIN ASPART 100 UNIT/ML IJ SOLN: 0.0000 [IU] | Freq: Every day | INTRAMUSCULAR | Status: DC

## 2021-01-22 MED ORDER — FLEET ENEMA 7-19 GM/118ML RE ENEM: 1.0000 | ENEMA | Freq: Once | RECTAL | Status: DC | PRN

## 2021-01-22 MED ORDER — BUPIVACAINE HCL (PF) 0.5 % IJ SOLN
INTRAMUSCULAR | Status: AC
  Filled 2021-01-22: qty 30

## 2021-01-22 MED ORDER — LIDOCAINE-EPINEPHRINE 1 %-1:100000 IJ SOLN
INTRAMUSCULAR | Status: AC
  Filled 2021-01-22: qty 1

## 2021-01-22 MED ORDER — ACETAMINOPHEN 325 MG PO TABS: 650.0000 mg | ORAL_TABLET | ORAL | Status: DC | PRN

## 2021-01-22 SURGICAL SUPPLY — 59 items
ADH SKN CLS APL DERMABOND .7 (GAUZE/BANDAGES/DRESSINGS) ×2
BATTALION LLIF ITRADISCAL SHIM (MISCELLANEOUS) ×4
BLADE CLIPPER SURG (BLADE) IMPLANT
CLIP SPRING STIM LLIF SAFEOP (CLIP) ×3 IMPLANT
COVER WAND RF STERILE (DRAPES) ×1 IMPLANT
DECANTER SPIKE VIAL GLASS SM (MISCELLANEOUS) ×4 IMPLANT
DERMABOND ADVANCED (GAUZE/BANDAGES/DRESSINGS) ×2
DERMABOND ADVANCED .7 DNX12 (GAUZE/BANDAGES/DRESSINGS) ×2 IMPLANT
DILATOR INSULATED LLIF 8-13-18 (NEUROSURGERY SUPPLIES) ×3 IMPLANT
DISSECTOR BLUNT TIP ENDO 5MM (MISCELLANEOUS) IMPLANT
DRAPE C-ARM 42X72 X-RAY (DRAPES) ×4 IMPLANT
DRAPE C-ARMOR (DRAPES) ×4 IMPLANT
DRAPE LAPAROTOMY 100X72X124 (DRAPES) ×4 IMPLANT
DRSG OPSITE POSTOP 4X6 (GAUZE/BANDAGES/DRESSINGS) ×3 IMPLANT
DURAPREP 26ML APPLICATOR (WOUND CARE) ×4 IMPLANT
ELECT KIT SAFEOP SSEP/SURF (KITS) ×4
ELECT REM PT RETURN 9FT ADLT (ELECTROSURGICAL) ×4
ELECTRODE KT SAFEOP SSEP/SURF (KITS) ×1 IMPLANT
ELECTRODE REM PT RTRN 9FT ADLT (ELECTROSURGICAL) ×2 IMPLANT
GAUZE 4X4 16PLY RFD (DISPOSABLE) IMPLANT
GLOVE EXAM NITRILE XL STR (GLOVE) IMPLANT
GLOVE SURG LTX SZ7.5 (GLOVE) ×4 IMPLANT
GLOVE SURG POLYISO LF SZ7 (GLOVE) ×18 IMPLANT
GLOVE SURG UNDER POLY LF SZ7.5 (GLOVE) ×13 IMPLANT
GOWN STRL REUS W/ TWL LRG LVL3 (GOWN DISPOSABLE) IMPLANT
GOWN STRL REUS W/ TWL XL LVL3 (GOWN DISPOSABLE) ×5 IMPLANT
GOWN STRL REUS W/TWL 2XL LVL3 (GOWN DISPOSABLE) IMPLANT
GOWN STRL REUS W/TWL LRG LVL3 (GOWN DISPOSABLE)
GOWN STRL REUS W/TWL XL LVL3 (GOWN DISPOSABLE) ×12
GUIDEWIRE LLIF TT 320 (WIRE) ×3 IMPLANT
HEMOSTAT POWDER KIT SURGIFOAM (HEMOSTASIS) ×3 IMPLANT
KIT BASIN OR (CUSTOM PROCEDURE TRAY) ×4 IMPLANT
KIT INFUSE X SMALL 1.4CC (Orthopedic Implant) ×3 IMPLANT
KIT TURNOVER KIT B (KITS) ×4 IMPLANT
KNIFE ANNULOTOMY GREY RETRACT (ORTHOPEDIC DISPOSABLE SUPPLIES) ×3 IMPLANT
LIF ILLUMINATION SYSTEM STERIL (SYSTAGENIX WOUND MANAGEMENT) ×4
MATRIX STRIP NEOCORE 12C (Putty) ×1 IMPLANT
NEEDLE HYPO 22GX1.5 SAFETY (NEEDLE) ×4 IMPLANT
NS IRRIG 1000ML POUR BTL (IV SOLUTION) ×4 IMPLANT
PACK LAMINECTOMY NEURO (CUSTOM PROCEDURE TRAY) ×4 IMPLANT
PLATE LIF AMP 2/SCRW 04/15 (Plate) ×6 IMPLANT
PROBE BALL TIP LLIF SAFEOP (NEUROSURGERY SUPPLIES) ×3 IMPLANT
SCREW LIF AMP 5.5X55 (Screw) ×6 IMPLANT
SHIM ITRADISCAL BATTALION LLIF (MISCELLANEOUS) ×1 IMPLANT
SPACER LIF IDENTITI 8X22X55 0D (Spacer) ×3 IMPLANT
SPONGE LAP 4X18 RFD (DISPOSABLE) IMPLANT
SPONGE SURGIFOAM ABS GEL SZ50 (HEMOSTASIS) IMPLANT
SPONGE TONSIL TAPE 1 RFD (DISPOSABLE) IMPLANT
STAPLER VISISTAT 35W (STAPLE) ×4 IMPLANT
STRIP MATRIX NEOCORE 12CC (Putty) ×2 IMPLANT
SUT MNCRL AB 4-0 PS2 18 (SUTURE) ×4 IMPLANT
SUT VIC AB 0 CT1 18XCR BRD8 (SUTURE) ×2 IMPLANT
SUT VIC AB 0 CT1 8-18 (SUTURE) ×4
SUT VIC AB 2-0 CP2 18 (SUTURE) ×7 IMPLANT
SYSTEM ILLUMINATION LIF STERIL (SYSTAGENIX WOUND MANAGEMENT) ×1 IMPLANT
TOWEL GREEN STERILE (TOWEL DISPOSABLE) ×4 IMPLANT
TOWEL GREEN STERILE FF (TOWEL DISPOSABLE) ×4 IMPLANT
TRAY FOLEY MTR SLVR 16FR STAT (SET/KITS/TRAYS/PACK) ×4 IMPLANT
WATER STERILE IRR 1000ML POUR (IV SOLUTION) ×4 IMPLANT

## 2021-01-22 NOTE — Op Note (Signed)
Procedure(s): Lumbar one-Lumbar two Lateral Lumbar Interbody Fusion Procedure Note  Robert Dickerson male 80 y.o. 01/22/2021  Procedure(s) and Anesthesia Type:    * Lumbar one-Lumbar two Lateral Lumbar Interbody Fusion - General  Surgeon(s) and Role:    Marcello Moores, Dorcas Carrow, MD - Primary    * Dawley, Theodoro Doing, DO - Assisting   Indications: This is a 80 year old man with history of multiple lumbar spine surgeries with posterior decompression and fusion from L2-L5.  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  He also had degenerated disc at T12-L1 but without significant stenosis.  Previous injections and other therapies did not help.  I had a long discussion with the patient regarding treatment options.  I discussed with him the option of extension of fusion to L1 to via a lateral interbody approach, with possible extension of posterior instrumentation.  Given his age and his medical comorbidities, my preference was to focus on his most symptomatic level at L1-2 and avoid reopening of his scar and instrumentation posteriorly.  risk, benefits, alternatives, and expect convalescence were discussed with him.  Risks discussed included, but were not limited to, bleeding, pain, infection, scar, pseudoarthrosis, adjacent segment disease, instrumentation failure, neurologic deficit, damage to nearby organs, pneumothorax, coma, and death.  Informed consent was obtained.      Surgeon: Vallarie Mare   Assistants: Elwin Sleight, DO.  Please note there were no qualified trainees available to assist with the procedure.  Dr. Reatha Armour helped with the placement of the instrumentation.  Anesthesia: General endotracheal anesthesia  ASA Class: none  Procedure Detail  Lumbar one-Lumbar two Lateral Lumbar Interbody Fusion 2.  Placement of lateral lumbar plate and screw fixation 3. Harvest of 12th  rib for autograft 4.  Use of osteopromotive allograft  The patient was brought to the op room.  A timeout was performed.  General anesthesia was induced and patient was intubated by the anesthesia service.  Patient positioned prone with the left side up with all pressure points padded and eyes protected and was affixed to the bed.  Preoperative C-arm x-ray confirmed perfect lateral positioning of the patient.  The bed was slightly broken to increase axis between the rib and the iliac crest.  The disc spaces of L1-2 and T12-L1 were marked out over the skin.  The flank was preprepped with alcohol and prepped and draped in sterile fashion.  1% lidocaine with epinephrine was injected to the skin.  Preoperative antibiotics and dexamethasone were administered.  A incision along Langerhans lines over the 12th rib was made and the external obliques were opened.  The 12th rib was then dissected from the intercostal muscles and the intercostal bundle and was resected and harvested with a rib cutter.  The retroperitoneal space was then entered and the peritoneal and retroperitoneal contents were reflected anteriorly.  The quadratus lumborum, transverse process, and psoas muscle were identified.  Initial dilator was placed over the L1-2 interbody space and passed through the psoas.  Stimulation revealed no nearby nerves.  K wire was used to secure the initial dilator to the disc space.  Subsequent dilators were placed followed by stimulation which resulted in no activation of any nearby nerves.  Tubular retractor was then placed and secured to the bed.  Lateral x-ray confirmed good positioning over the disc space in the posterior third.  The dilator was removed and the field was stimulated and  no nerves were identified with neuro monitoring or visually.  Shim was placed in the posterior blade.  The retractor was then expanded.  Remaining small amount of muscle was dissected off of the disc space and osteophytes and  stimulation yielded no nerves in the field.  The disc was incised with a 15 blade and discectomy was performed with pituitary rongeurs.  Cobb elevators followed by box cutters were used to release the contralateral annulus and help perform the discectomy.  The endplates were then prepared with curettes and rasps.  With AP x-ray guidance, increasingly sized trials were then placed and a size 8 mm parallel implant was chosen and packed with allograft and extra extra small BMP.  This was tamped in place under AP and lateral x-ray guidance and showed excellent positioning with restoration of disc space height.  Lateral osteophytes were removed and a lateral plate was attached to the implant.  Using C-arm guidance, awls were used to puncture screw paths in the L1 body and L2 body and 5.5 x 55 mm screws were then placed into both bodies under C-arm guidance.  There was good purchase of the bone.  As the purchase was good and the implant felt very secure, I did not feel supplementary instrumentation posteriorly would give enough benefit to justify the risks of reopening his dense scar and revising his instrumentation.  The T12-L1 disc space was explored in the retroperitoneal space.  Good docking could not be performed without resection of the 11th rib.  Given his age and comorbidities, as this level was not clearly symptomatic, I felt it was most prudent to not fuse this level as it would risk intrathoracic entry and increase the complexity of his recovery, and additionally likely would necessitate posterior instrumentation revision for greater fixation.  Meticulous hemostasis was obtained.  The shim was removed and the retractor removed and meticulous hemostasis was obtained.  The wound was irrigated thoroughly.  The fascia was then closed with 0 Vicryl stitches.  The dermal layer was closed with 2-0 Vicryl stitches in buried interrupted fashion.  The skin was closed with 4-0 Monocryl in subcuticular manner followed by  Dermabond and a sterile dressing.  Patient was then extubated by the anesthesia service.  All counts were correct at the end of surgery.  No complications were noted.   Findings: Successful interbody fusion at L1-2.  Access to T12-L1 was limited by the diaphragm.  Estimated Blood Loss:  less than 50 mL         Drains: none         Total IV Fluids: See anesthesia records  Blood Given: none          Specimens: non         Implants:  Alphatec 8 x 22 x 55 mm interbody cage Size 4 AMP plate 5.5 x 55 screws x2        Complications:  * No complications entered in OR log *         Disposition: PACU - hemodynamically stable.         Condition: stable

## 2021-01-22 NOTE — Anesthesia Procedure Notes (Addendum)
Procedure Name: Intubation Date/Time: 01/22/2021 8:04 AM Performed by: Georgia Duff, CRNA Pre-anesthesia Checklist: Patient identified, Emergency Drugs available, Suction available and Patient being monitored Patient Re-evaluated:Patient Re-evaluated prior to induction Oxygen Delivery Method: Circle system utilized Preoxygenation: Pre-oxygenation with 100% oxygen Induction Type: IV induction Ventilation: Oral airway inserted - appropriate to patient size Grade View: Grade II Tube size: 7.5 mm Number of attempts: 2 Placement Confirmation: ETT inserted through vocal cords under direct vision, positive ETCO2 and breath sounds checked- equal and bilateral Secured at: 22 cm Dental Injury: Teeth and Oropharynx as per pre-operative assessment

## 2021-01-22 NOTE — Anesthesia Postprocedure Evaluation (Signed)
Anesthesia Post Note  Patient: Robert Dickerson  Procedure(s) Performed: Lumbar one-Lumbar two Lateral Lumbar Interbody Fusion (Left: Flank)     Patient location during evaluation: PACU Anesthesia Type: General Level of consciousness: awake and alert Pain management: pain level controlled Vital Signs Assessment: post-procedure vital signs reviewed and stable Respiratory status: spontaneous breathing, nonlabored ventilation, respiratory function stable and patient connected to nasal cannula oxygen Cardiovascular status: blood pressure returned to baseline and stable Postop Assessment: no apparent nausea or vomiting Anesthetic complications: no   No notable events documented.  Last Vitals:  Vitals:   01/22/21 1206 01/22/21 1249  BP: 126/60 122/65  Pulse: 78 70  Resp: 19 18  Temp: 36.6 C (!) 36.4 C  SpO2: 93% 96%    Last Pain:  Vitals:   01/22/21 1338  TempSrc:   PainSc: 10-Worst pain ever                 [name] S

## 2021-01-22 NOTE — Transfer of Care (Signed)
Immediate Anesthesia Transfer of Care Note  Patient: Robert Dickerson  Procedure(s) Performed: Lumbar one-Lumbar two Lateral Lumbar Interbody Fusion (Left: Flank)  Patient Location: PACU  Anesthesia Type:General  Level of Consciousness: drowsy and patient cooperative  Airway & Oxygen Therapy: Patient Spontanous Breathing and Patient connected to face mask oxygen  Post-op Assessment: Report given to RN and Post -op Vital signs reviewed and stable  Post vital signs: Reviewed and stable  Last Vitals:  Vitals Value Taken Time  BP 125/66 01/22/21 1121  Temp    Pulse 80 01/22/21 1125  Resp 14 01/22/21 1125  SpO2 100 % 01/22/21 1125  Vitals shown include unvalidated device data.  Last Pain:  Vitals:   01/22/21 0612  TempSrc:   PainSc: 7       Patients Stated Pain Goal: 4 (28/36/62 9476)  Complications: No notable events documented.

## 2021-01-22 NOTE — Progress Notes (Signed)
Orthopedic Tech Progress Note Patient Details:  Robert Dickerson 08-05-1940 [id number] Left in room Ortho Devices Type of Ortho Device: Other (comment) Ortho Device/Splint Location: TLSO Ortho Device/Splint Interventions: Ordered      Siddalee Vanderheiden A Huy Majid 01/22/2021, 1:01 PM

## 2021-01-22 NOTE — Evaluation (Signed)
Physical Therapy Evaluation Patient Details Name: Robert Dickerson MRN: [id number] DOB: 1940/12/07 Today's Date: 01/22/2021   History of Present Illness  Patient is a 80 y.o. male admitted following Lumbar one-Lumbar two Lateral Lumbar Interbody Fusion. pt with history of multiple lumbar surgeries with L2-5 posterior decompression and fusion who developed worsening midline back pain that was worse with activity as well as bilateral leg pain, R>L for the past 6 months that has not responded to physical therapy and injections  Clinical Impression  Pt admitted following surgery. Pt with increased pain during evaluation requiring use of RW. Pt I PTA. Pt with balance deficits and silght decrease in LE strength requiring use of RW with OOB for safety. Pt will benefit from skilled PT to address deficits with balance, strength, coordination, gait, endurance, safety and pain to maximize independence with functional mobility prior to discharge.     Follow Up Recommendations No PT follow up vs Outpatient PT pending progress with PT;Supervision for mobility/OOB    Equipment Recommendations  Rolling walker with 5" wheels; possible pending progress with PT   Recommendations for Other Services       Precautions / Restrictions Precautions Precautions: Fall;Back Precaution Booklet Issued: No Required Braces or Orthoses: Spinal Brace Spinal Brace: Thoracolumbosacral orthotic;Applied in sitting position Restrictions Weight Bearing Restrictions: No      Mobility  Bed Mobility Overal bed mobility: Needs Assistance Bed Mobility: Rolling;Sidelying to Sit;Sit to Sidelying Rolling: Supervision Sidelying to sit: Supervision     Sit to sidelying: Supervision General bed mobility comments: pt able to perform log roll technique following education    Transfers Overall transfer level: Needs assistance Equipment used: Rolling walker (2 wheeled) Transfers: Sit to/from Stand Sit to Stand: Min guard             Ambulation/Gait Ambulation/Gait assistance: Min guard Gait Distance (Feet): 120 Feet Assistive device: Rolling walker (2 wheeled) Gait Pattern/deviations: Decreased step length - right;Decreased step length - left Gait velocity: decreased velocity   General Gait Details: pt ambualted with RW wtih increased UE weight bearing. pt states he wants to try to ambulate without RW tomorrow  Stairs            Wheelchair Mobility    Modified Rankin (Stroke Patients Only)       Balance Overall balance assessment: Needs assistance Sitting-balance support: Feet supported Sitting balance-Leahy Scale: Good       Standing balance-Leahy Scale: Fair Standing balance comment: minimal standing without UE support with S. dyanmic requiring use of RW                             Pertinent Vitals/Pain Pain Assessment: 0-10 Pain Score: 10-Worst pain ever Pain Location: back    Home Living Family/patient expects to be discharged to:: Private residence Living Arrangements: Spouse/significant other Available Help at Discharge: Family;Available 24 hours/day Type of Home: House Home Access: Stairs to enter Entrance Stairs-Rails: None (post he can reach on both sides) Entrance Stairs-Number of Steps: 2 Home Layout: One level Home Equipment: Cane - single point      Prior Function Level of Independence: Independent         Comments: outside will use walking stick     Hand Dominance        Extremity/Trunk Assessment   Upper Extremity Assessment Upper Extremity Assessment: Defer to OT evaluation    Lower Extremity Assessment Lower Extremity Assessment: Overall WFL for tasks assessed  Communication   Communication: No difficulties  Cognition Arousal/Alertness: Awake/alert Behavior During Therapy: WFL for tasks assessed/performed Overall Cognitive Status: Within Functional Limits for tasks assessed                                         General Comments General comments (skin integrity, edema, etc.): therapist assisting with TLSO with education to family regarding brace    Exercises     Assessment/Plan    PT Assessment Patient needs continued PT services  PT Problem List Decreased mobility;Decreased coordination;Decreased activity tolerance;Decreased balance;Decreased strength;Pain       PT Treatment Interventions DME instruction;Therapeutic exercise;Gait training;Balance training;Stair training;Functional mobility training;Therapeutic activities;Patient/family education    PT Goals (Current goals can be found in the Care Plan section)  Acute Rehab PT Goals Patient Stated Goal: to go home, i dont like hospitals PT Goal Formulation: With patient Time For Goal Achievement: 02/05/21 Potential to Achieve Goals: Good    Frequency Min 5X/week   Barriers to discharge        Co-evaluation               AM-PAC PT "6 Clicks" Mobility  Outcome Measure Help needed turning from your back to your side while in a flat bed without using bedrails?: None Help needed moving from lying on your back to sitting on the side of a flat bed without using bedrails?: None Help needed moving to and from a bed to a chair (including a wheelchair)?: A Little Help needed standing up from a chair using your arms (e.g., wheelchair or bedside chair)?: A Little Help needed to walk in hospital room?: A Little Help needed climbing 3-5 steps with a railing? : A Little 6 Click Score: 20    End of Session Equipment Utilized During Treatment: Gait belt;Back brace Activity Tolerance: Patient tolerated treatment well Patient left: in bed;with call bell/phone within reach;with family/visitor present Nurse Communication: Mobility status PT Visit Diagnosis: Unsteadiness on feet (R26.81);Other abnormalities of gait and mobility (R26.89);Pain Pain - part of body:  (back)    Time: 7412-8786 PT Time Calculation (min) (ACUTE ONLY): 22  min   Charges:   PT Evaluation $PT Eval Low Complexity: [place], DPT Acute Rehabilitation Services [phone]   Robert Dickerson 01/22/2021, 3:50 PM

## 2021-01-23 LAB — GLUCOSE, CAPILLARY: Glucose-Capillary: 156 mg/dL — ABNORMAL HIGH (ref 70–99)

## 2021-01-23 MED ORDER — HYDROCODONE-ACETAMINOPHEN 5-325 MG PO TABS: 1.0000 | ORAL_TABLET | ORAL | 0 refills | Status: DC | PRN

## 2021-01-23 MED ORDER — DOCUSATE SODIUM 100 MG PO CAPS: 100.0000 mg | ORAL_CAPSULE | Freq: Two times a day (BID) | ORAL | 2 refills | Status: DC

## 2021-01-23 MED ORDER — CYCLOBENZAPRINE HCL 10 MG PO TABS: 10.0000 mg | ORAL_TABLET | Freq: Three times a day (TID) | ORAL | 0 refills | Status: DC | PRN

## 2021-01-23 NOTE — Progress Notes (Signed)
NURSING PROGRESS NOTE  COLIN ELLERS [id number] Discharge Data: 01/23/2021 12:50 PM Attending Provider: Vallarie Mare, MD OEV:[id number], Jenny Reichmann, MD     [name] to be D/C'd Home per MD order.  Discussed with the patient the After Visit Summary and all questions fully answered. All IV's discontinued with no bleeding noted. All belongings returned to patient for patient to take home.   Last Vital Signs:  Blood pressure 132/63, pulse 70, temperature (!) 97.5 F (36.4 C), temperature source Oral, resp. rate 18, height '5\' 10"'  (1.778 m), weight 101.9 kg, SpO2 99 %.  Discharge Medication List Allergies as of 01/23/2021       Reactions   Ace Inhibitors Other (See Comments), Cough   Lisinopril Cough   Other reaction(s): Cough   Oxycodone Other (See Comments)   "out of it"        Medication List     TAKE these medications    Accu-Chek Guide test strip Generic drug: glucose blood USE TO CHECK BLOOD SUGAR AS DIRECTED UP TO FOUR TIMES DAILY   Accu-Chek Softclix Lancets lancets SMARTSIG:Topical 1-4 Times Daily   acetaminophen 650 MG CR tablet Commonly known as: TYLENOL Take 650 mg by mouth 2 (two) times daily as needed for pain.   allopurinol 300 MG tablet Commonly known as: ZYLOPRIM Take 300 mg by mouth daily.   amLODipine 10 MG tablet Commonly known as: NORVASC Take 10 mg by mouth daily.   blood glucose meter kit and supplies Kit Dispense based on patient and insurance preference. Use up to four times daily as directed.   cetirizine 10 MG tablet Commonly known as: ZYRTEC Take 5 mg by mouth at bedtime.   cyclobenzaprine 10 MG tablet Commonly known as: FLEXERIL Take 1 tablet (10 mg total) by mouth 3 (three) times daily as needed for muscle spasms.   docusate sodium 100 MG capsule Commonly known as: COLACE Take 1 capsule (100 mg total) by mouth 2 (two) times daily.   econazole nitrate 1 % cream Apply 1 application topically daily as needed (rash).   furosemide  80 MG tablet Commonly known as: LASIX Take 80 mg by mouth. 1 Tablet Daily   HYDROcodone-acetaminophen 5-325 MG tablet Commonly known as: NORCO/VICODIN Take 1 tablet by mouth every 4 (four) hours as needed for moderate pain ((score 4 to 6)).   Lantus SoloStar 100 UNIT/ML Solostar Pen Generic drug: insulin glargine Inject 10 Units into the skin daily.   Pentips 32G X 4 MM Misc Generic drug: Insulin Pen Needle use as directed   potassium chloride 20 MEQ packet Commonly known as: KLOR-CON Take 20 mEq by mouth daily. 1 Tablet Daily   predniSONE 5 MG tablet Commonly known as: DELTASONE Take 5 mg by mouth daily with breakfast.   pregabalin 75 MG capsule Commonly known as: LYRICA Take 75 mg by mouth 2 (two) times daily.   triamcinolone cream 0.5 % Commonly known as: KENALOG Apply 1 application topically daily as needed (rash).               Durable Medical Equipment  (From admission, onward)           Start     Ordered   01/23/21 0911  For home use only DME Walker  Once       Question:  Patient needs a walker to treat with the following condition  Answer:  S/P lumbar spinal fusion   01/23/21 0910

## 2021-01-23 NOTE — Progress Notes (Signed)
Physical Therapy Treatment Patient Details Name: Robert Dickerson MRN: [id number] DOB: 12-15-1940 Today's Date: 01/23/2021    History of Present Illness Patient is a 80 y.o. male admitted following Lumbar one-Lumbar two Lateral Lumbar Interbody Fusion. pt with history of multiple lumbar surgeries with L2-5 posterior decompression and fusion who developed worsening midline back pain that was worse with activity as well as bilateral leg pain, R>L for the past 6 months that has not responded to physical therapy and injections    PT Comments    Pt making steady progress with functional mobility. He tolerated stair training well this session with cueing and min guard. PT reviewed 3/3 back precautions, donning/doffing TLSO and a generalized walking program for pt to initiate upon d/c home. Plan is to d/c home today with family support.    Follow Up Recommendations  No PT follow up     Equipment Recommendations  Rolling walker with 5" wheels    Recommendations for Other Services       Precautions / Restrictions Precautions Precautions: Fall;Back Precaution Booklet Issued: Yes (comment) Precaution Comments: PT reviewed 3/3 back precautions with pt throughout Required Braces or Orthoses: Spinal Brace Spinal Brace: Thoracolumbosacral orthotic;Applied in sitting position Restrictions Weight Bearing Restrictions: No    Mobility  Bed Mobility Overal bed mobility: Modified Independent     Sidelying to sit: Modified independent (Device/Increase time)     Sit to sidelying: Modified independent (Device/Increase time) General bed mobility comments: pt seated upright at EOB upon PT arrival    Transfers Overall transfer level: Modified independent Equipment used: Rolling walker (2 wheeled) Transfers: Sit to/from Stand Sit to Stand: Modified independent (Device/Increase time)         General transfer comment: increased time  Ambulation/Gait Ambulation/Gait assistance:  Supervision Gait Distance (Feet): 500 Feet Assistive device: Rolling walker (2 wheeled) Gait Pattern/deviations: Step-through pattern;Decreased stride length Gait velocity: decreased   General Gait Details: pt with slow, cautious gait with use of RW, no instability or overt LOB, no physical assistance needed   Stairs Stairs: Yes Stairs assistance: Min guard Stair Management: One rail Right;Step to pattern;Forwards Number of Stairs: 2 General stair comments: min guard and cueing for safety   Wheelchair Mobility    Modified Rankin (Stroke Patients Only)       Balance Overall balance assessment: Mild deficits observed, not formally tested                                          Cognition Arousal/Alertness: Awake/alert Behavior During Therapy: WFL for tasks assessed/performed Overall Cognitive Status: Within Functional Limits for tasks assessed                                        Exercises      General Comments        Pertinent Vitals/Pain Pain Assessment: 0-10 Pain Score: 3  Pain Location: back Pain Descriptors / Indicators: Operative site guarding Pain Intervention(s): Monitored during session;Repositioned    Home Living Family/patient expects to be discharged to:: Private residence Living Arrangements: Spouse/significant other Available Help at Discharge: Family;Available 24 hours/day Type of Home: House Home Access: Stairs to enter Entrance Stairs-Rails: None Home Layout: One level Home Equipment: Cane - single point      Prior Function Level of Independence: Independent  PT Goals (current goals can now be found in the care plan section) Acute Rehab PT Goals Patient Stated Goal: returning home PT Goal Formulation: With patient Time For Goal Achievement: 02/05/21 Potential to Achieve Goals: Good Progress towards PT goals: Progressing toward goals    Frequency    Min 5X/week      PT Plan  Current plan remains appropriate    Co-evaluation              AM-PAC PT "6 Clicks" Mobility   Outcome Measure  Help needed turning from your back to your side while in a flat bed without using bedrails?: None Help needed moving from lying on your back to sitting on the side of a flat bed without using bedrails?: None Help needed moving to and from a bed to a chair (including a wheelchair)?: None Help needed standing up from a chair using your arms (e.g., wheelchair or bedside chair)?: None Help needed to walk in hospital room?: None Help needed climbing 3-5 steps with a railing? : A Little 6 Click Score: 23    End of Session Equipment Utilized During Treatment: Back brace Activity Tolerance: Patient tolerated treatment well Patient left: with call bell/phone within reach;with family/visitor present;Other (comment) (seated EOB) Nurse Communication: Mobility status PT Visit Diagnosis: Unsteadiness on feet (R26.81);Other abnormalities of gait and mobility (R26.89);Pain Pain - part of body:  (back)     Time: 5056-9794 PT Time Calculation (min) (ACUTE ONLY): 13 min  Charges:  $Gait Training: 8-22 mins                     Anastasio Champion, DPT  Acute Rehabilitation Services Pager [phone] Office [place] 01/23/2021, 9:55 AM

## 2021-01-23 NOTE — Discharge Summary (Signed)
Physician Discharge Summary  Patient ID: Robert Dickerson MRN: [id number] DOB/AGE: [birth year] 80 y.o.  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 male with history of L2-5 posterior decompression and fusion who developed adjacent segment disease with severe stenosis and associated neurogenic claudication at L1-2.  He underwent L1-2 left-sided direct lateral interbody fusion on 01/22/2021.  Postoperatively, he was admitted to the spine unit.  There, he was mobilized with the help of physical therapy and Occupational Therapy.  He was placed in a TLSO brace his pain was well controlled with p.o. pain medication and he was voiding without difficulty.  He was deemed ready for discharge home on 01/23/2021  Treatments: Surgery -left L1-2 D LIF  Discharge Exam: Blood pressure 132/63, pulse 70, temperature (!) 97.5 F (36.4 C), temperature source Oral, resp. rate 18, height '5\' 10"'  (1.778 m), weight 101.9 kg, SpO2 99 %. Awake, alert, oriented Speech fluent, appropriate CN grossly intact 5/5 BUE/BLE Wound c/d/i  Disposition: Discharge disposition: 01-Home or Self Care       Discharge Instructions     Incentive spirometry RT   Complete by: As directed       Allergies as of 01/23/2021       Reactions   Ace Inhibitors Other (See Comments), Cough   Lisinopril Cough   Other reaction(s): Cough   Oxycodone Other (See Comments)   "out of it"        Medication List     TAKE these medications    Accu-Chek Guide test strip Generic drug: glucose blood USE TO CHECK BLOOD SUGAR AS DIRECTED UP TO FOUR TIMES DAILY   Accu-Chek Softclix Lancets lancets SMARTSIG:Topical 1-4 Times Daily   acetaminophen 650 MG CR tablet Commonly  known as: TYLENOL Take 650 mg by mouth 2 (two) times daily as needed for pain.   allopurinol 300 MG tablet Commonly known as: ZYLOPRIM Take 300 mg by mouth daily.   amLODipine 10 MG tablet Commonly known as: NORVASC Take 10 mg by mouth daily.   blood glucose meter kit and supplies Kit Dispense based on patient and insurance preference. Use up to four times daily as directed.   cetirizine 10 MG tablet Commonly known as: ZYRTEC Take 5 mg by mouth at bedtime.   cyclobenzaprine 10 MG tablet Commonly known as: FLEXERIL Take 1 tablet (10 mg total) by mouth 3 (three) times daily as needed for muscle spasms.   docusate sodium 100 MG capsule Commonly known as: COLACE Take 1 capsule (100 mg total) by mouth 2 (two) times daily.   econazole nitrate 1 % cream Apply 1 application topically daily as needed (rash).   furosemide 80 MG tablet Commonly known as: LASIX Take 80 mg by mouth. 1 Tablet Daily   HYDROcodone-acetaminophen 5-325 MG tablet Commonly known as: NORCO/VICODIN Take 1 tablet by mouth every 4 (four) hours as needed for moderate pain ((score 4 to 6)).   Lantus SoloStar 100 UNIT/ML Solostar Pen Generic drug: insulin glargine Inject 10 Units into the skin daily.   Pentips 32G X 4 MM Misc Generic drug: Insulin Pen Needle use as directed   potassium chloride 20 MEQ packet Commonly known as: KLOR-CON Take 20 mEq by mouth daily. 1 Tablet Daily   predniSONE 5 MG tablet Commonly known  as: DELTASONE Take 5 mg by mouth daily with breakfast.   pregabalin 75 MG capsule Commonly known as: LYRICA Take 75 mg by mouth 2 (two) times daily.   triamcinolone cream 0.5 % Commonly known as: KENALOG Apply 1 application topically daily as needed (rash).               Durable Medical Equipment  (From admission, onward)           Start     Ordered   01/23/21 0911  For home use only DME Walker  Once       Question:  Patient needs a walker to treat with the following  condition  Answer:  S/P lumbar spinal fusion   01/23/21 0910             Signed: Vallarie Mare 01/23/2021, 10:26 AM

## 2021-01-23 NOTE — Plan of Care (Signed)
ADEQUATELY READY FOR DISCHARGE 

## 2021-01-23 NOTE — Discharge Instructions (Addendum)
Wound Care REMOVE OUTER DRESSING IN 3 DAYS Leave incision open to air. You may shower. Do not scrub directly on incision.  Do not put any creams, lotions, or ointments on incision. Activity Walk each and every day, increasing distance each day. No lifting greater than 5 lbs.  Avoid bending, arching, and twisting. No driving for 2 weeks; may ride as a passenger locally. If provided with back brace, wear when out of bed.  It is not necessary to wear in bed. Diet Resume your normal diet.   Call Your Doctor If Any of These Occur Redness, drainage, or swelling at the wound.  Temperature greater than 101 degrees. Severe pain not relieved by pain medication. Incision starts to come apart. Follow Up Appt Call today for appointment in 2 weeks ([phone]) or for problems.  If you have any hardware placed in your spine, you will need an x-ray before your appointment.

## 2021-01-23 NOTE — Plan of Care (Signed)
  Problem: Education: Goal: Ability to verbalize activity precautions or restrictions will improve Outcome: Adequate for Discharge Goal: Knowledge of the prescribed therapeutic regimen will improve Outcome: Adequate for Discharge Goal: Understanding of discharge needs will improve Outcome: Adequate for Discharge   Problem: Activity: Goal: Ability to avoid complications of mobility impairment will improve Outcome: Adequate for Discharge Goal: Ability to tolerate increased activity will improve Outcome: Adequate for Discharge Goal: Will remain free from falls Outcome: Adequate for Discharge   Problem: Bowel/Gastric: Goal: Gastrointestinal status for postoperative course will improve Outcome: Adequate for Discharge   Problem: Clinical Measurements: Goal: Ability to maintain clinical measurements within normal limits will improve Outcome: Adequate for Discharge Goal: Postoperative complications will be avoided or minimized Outcome: Adequate for Discharge Goal: Diagnostic test results will improve Outcome: Adequate for Discharge   Problem: Pain Management: Goal: Pain level will decrease Outcome: Adequate for Discharge   Problem: Skin Integrity: Goal: Will show signs of wound healing Outcome: Adequate for Discharge   Problem: Health Behavior/Discharge Planning: Goal: Identification of resources available to assist in meeting health care needs will improve Outcome: Adequate for Discharge   Problem: Bladder/Genitourinary: Goal: Urinary functional status for postoperative course will improve Outcome: Adequate for Discharge

## 2021-01-23 NOTE — Evaluation (Signed)
Occupational Therapy Evaluation Patient Details Name: Robert Dickerson MRN: [id number] DOB: 1940-10-10 Today's Date: 01/23/2021    History of Present Illness Patient is a 80 y.o. male admitted following Lumbar one-Lumbar two Lateral Lumbar Interbody Fusion. pt with history of multiple lumbar surgeries with L2-5 posterior decompression and fusion who developed worsening midline back pain that was worse with activity as well as bilateral leg pain, R>L for the past 6 months that has not responded to physical therapy and injections   Clinical Impression   Patient admitted for the diagnosis and procedure above.  PTA he lives with his spouse, who can provide assist as needed.  Barrier is mild back pain s/p surgery, but he is close to Mod I for ADL at sit/stand level, and Mod I for basic mobility at RW level.  All questions answered, and review of back precautions done.  No further OT needs in the acute setting.      Follow Up Recommendations  No OT follow up    Equipment Recommendations  Other (comment) (hip kit)    Recommendations for Other Services       Precautions / Restrictions Precautions Precautions: Fall;Back Precaution Booklet Issued: No Required Braces or Orthoses: Spinal Brace Spinal Brace: Thoracolumbosacral orthotic;Applied in sitting position Restrictions Weight Bearing Restrictions: No      Mobility Bed Mobility Overal bed mobility: Modified Independent     Sidelying to sit: Modified independent (Device/Increase time)     Sit to sidelying: Modified independent (Device/Increase time) General bed mobility comments: use of SR's Patient Response: Cooperative  Transfers Overall transfer level: Modified independent Equipment used: Rolling walker (2 wheeled) Transfers: Sit to/from Stand Sit to Stand: Modified independent (Device/Increase time)              Balance Overall balance assessment: Mild deficits observed, not formally tested                                          ADL either performed or assessed with clinical judgement   ADL Overall ADL's : Modified independent                                             Vision Patient Visual Report: No change from baseline       Perception     Praxis      Pertinent Vitals/Pain Pain Score: 3  Pain Location: back Pain Descriptors / Indicators: Operative site guarding Pain Intervention(s): Monitored during session     Hand Dominance Right   Extremity/Trunk Assessment Upper Extremity Assessment Upper Extremity Assessment: Overall WFL for tasks assessed       Cervical / Trunk Assessment Cervical / Trunk Assessment: Other exceptions Cervical / Trunk Exceptions: spine surgeries   Communication Communication Communication: No difficulties   Cognition Arousal/Alertness: Awake/alert Behavior During Therapy: WFL for tasks assessed/performed Overall Cognitive Status: Within Functional Limits for tasks assessed                                                      Home Living Family/patient expects to be discharged to:: Private residence Living Arrangements: Spouse/significant other  Available Help at Discharge: Family;Available 24 hours/day Type of Home: House Home Access: Stairs to enter   Entrance Stairs-Rails: None Home Layout: One level     Bathroom Shower/Tub: Teacher, early years/pre: Standard Bathroom Accessibility: Yes How Accessible: Accessible via walker Home Equipment: Cane - single point          Prior Functioning/Environment Level of Independence: Independent                 OT Problem List: Pain      OT Treatment/Interventions:      OT Goals(Current goals can be found in the care plan section) Acute Rehab OT Goals Patient Stated Goal: returning home OT Goal Formulation: With patient Time For Goal Achievement: 01/23/21 Potential to Achieve Goals: Fair  OT Frequency:      Barriers to D/C:  None noted          Co-evaluation              AM-PAC OT "6 Clicks" Daily Activity     Outcome Measure Help from another person eating meals?: None Help from another person taking care of personal grooming?: None Help from another person toileting, which includes using toliet, bedpan, or urinal?: None Help from another person bathing (including washing, rinsing, drying)?: None Help from another person to put on and taking off regular upper body clothing?: None Help from another person to put on and taking off regular lower body clothing?: A Little 6 Click Score: 23   End of Session Equipment Utilized During Treatment: Rolling walker;Back brace  Activity Tolerance: Patient tolerated treatment well Patient left: in bed;with call bell/phone within reach  OT Visit Diagnosis: Pain                Time: 2841-3244 OT Time Calculation (min): 28 min Charges:  OT General Charges $OT Visit: 1 Visit OT Evaluation $OT Eval Moderate Complexity: 1 Mod OT Treatments $Self Care/Home Management : 8-22 mins  01/23/2021  Rich, OTR/L  Acute Rehabilitation Services  Office:  [phone]   Metta Clines 01/23/2021, 8:51 AM

## 2021-01-23 NOTE — Progress Notes (Signed)
Patient alert and oriented, voiding adequately, MAE well with no difficulty. Incision area cdi with no s/s of infection. Patient discharged home per order. Patient and wife stated understanding of discharge instructions given. Patient has an appointment with Dr. Marcello Moores in 2 weeks

## 2021-01-27 ENCOUNTER — Encounter (HOSPITAL_COMMUNITY): Payer: Self-pay | Admitting: Neurosurgery

## 2021-01-28 DIAGNOSIS — E1140 Type 2 diabetes mellitus with diabetic neuropathy, unspecified: Secondary | ICD-10-CM | POA: Diagnosis not present

## 2021-01-28 DIAGNOSIS — I10 Essential (primary) hypertension: Secondary | ICD-10-CM | POA: Diagnosis not present

## 2021-01-28 DIAGNOSIS — N1832 Chronic kidney disease, stage 3b: Secondary | ICD-10-CM | POA: Diagnosis not present

## 2021-01-28 DIAGNOSIS — I5030 Unspecified diastolic (congestive) heart failure: Secondary | ICD-10-CM | POA: Diagnosis not present

## 2021-01-28 DIAGNOSIS — I129 Hypertensive chronic kidney disease with stage 1 through stage 4 chronic kidney disease, or unspecified chronic kidney disease: Secondary | ICD-10-CM | POA: Diagnosis not present

## 2021-01-28 DIAGNOSIS — K219 Gastro-esophageal reflux disease without esophagitis: Secondary | ICD-10-CM | POA: Diagnosis not present

## 2021-01-28 DIAGNOSIS — M10372 Gout due to renal impairment, left ankle and foot: Secondary | ICD-10-CM | POA: Diagnosis not present

## 2021-01-29 DIAGNOSIS — I10 Essential (primary) hypertension: Secondary | ICD-10-CM | POA: Diagnosis not present

## 2021-02-08 DIAGNOSIS — Z20822 Contact with and (suspected) exposure to covid-19: Secondary | ICD-10-CM | POA: Diagnosis not present

## 2021-02-16 DIAGNOSIS — N184 Chronic kidney disease, stage 4 (severe): Secondary | ICD-10-CM | POA: Diagnosis not present

## 2021-02-27 DIAGNOSIS — I10 Essential (primary) hypertension: Secondary | ICD-10-CM | POA: Diagnosis not present

## 2021-03-02 ENCOUNTER — Encounter: Payer: Self-pay | Admitting: Registered"

## 2021-03-02 ENCOUNTER — Other Ambulatory Visit: Payer: Self-pay

## 2021-03-02 ENCOUNTER — Encounter: Payer: Medicare Other | Attending: Internal Medicine | Admitting: Registered"

## 2021-03-02 DIAGNOSIS — E876 Hypokalemia: Secondary | ICD-10-CM | POA: Diagnosis not present

## 2021-03-02 DIAGNOSIS — I129 Hypertensive chronic kidney disease with stage 1 through stage 4 chronic kidney disease, or unspecified chronic kidney disease: Secondary | ICD-10-CM | POA: Diagnosis not present

## 2021-03-02 DIAGNOSIS — E1165 Type 2 diabetes mellitus with hyperglycemia: Secondary | ICD-10-CM | POA: Diagnosis not present

## 2021-03-02 DIAGNOSIS — N1832 Chronic kidney disease, stage 3b: Secondary | ICD-10-CM | POA: Diagnosis not present

## 2021-03-02 DIAGNOSIS — E871 Hypo-osmolality and hyponatremia: Secondary | ICD-10-CM | POA: Diagnosis not present

## 2021-03-02 DIAGNOSIS — E1140 Type 2 diabetes mellitus with diabetic neuropathy, unspecified: Secondary | ICD-10-CM | POA: Insufficient documentation

## 2021-03-02 DIAGNOSIS — IMO0002 Reserved for concepts with insufficient information to code with codable children: Secondary | ICD-10-CM

## 2021-03-02 DIAGNOSIS — E1122 Type 2 diabetes mellitus with diabetic chronic kidney disease: Secondary | ICD-10-CM | POA: Diagnosis not present

## 2021-03-02 DIAGNOSIS — I509 Heart failure, unspecified: Secondary | ICD-10-CM | POA: Diagnosis not present

## 2021-03-02 NOTE — Progress Notes (Signed)
Diabetes Self-Management Education  Visit Type: First/Initial  Appt. Start Time: 1100 Appt. End Time: 1205  03/02/2021  Mr. Venita Sheffield, identified by name and date of birth, is a 80 y.o. male with a diagnosis of Diabetes: Type 2.   ASSESSMENT  There were no vitals taken for this visit. There is no height or weight on file to calculate BMI.  A1c 9.5% Lantus 10 units GFR 30-40 mL/min recent labs during hospital admission  Sleep: 6-8 hrs Stress: 5/10  Patient states he feels his diet is pretty good now, no sweets, doesn't use much salt, and has reduced intake of fried foods and bread. Pt states they use an air fryer often to prepare food. Pt reports he doesn't eat much meat, sometimes just once a day. Pt sates he probably eats too much fruit.  Pt states physical activity has been limited due to getting out of breath easily. Pt state is able to walk around his property and is hoping that he will be healed enough from his recent back surgery that he will be able to mow his lawn again which is about 2 acres. Pt states his wife has been helping out a lot.  Pt states he is interested in getting a CGM because his fingers are getting sore.    Diabetes Self-Management Education - 03/02/21 1111       Visit Information   Visit Type First/Initial      Initial Visit   Diabetes Type Type 2    Are you currently following a meal plan? Yes    Are you taking your medications as prescribed? Yes    Date Diagnosed 2022      Health Coping   How would you rate your overall health? Poor      Psychosocial Assessment   Patient Belief/Attitude about Diabetes Other (comment)   accepts it, not happy   How often do you need to have someone help you when you read instructions, pamphlets, or other written materials from your doctor or pharmacy? 1 - Never    What is the last grade level you completed in school? 12      Complications   Last HgB A1C per patient/outside source 9.5 %    How often do you  check your blood sugar? 1-2 times/day    Fasting Blood glucose range (mg/dL) 70-129   106-120   Have you had a dilated eye exam in the past 12 months? Yes    Have you had a dental exam in the past 12 months? Yes    Are you checking your feet? Yes   has appt with podiatrist every 3 months, wife looks at feet   How many days per week are you checking your feet? 7      Dietary Intake   Breakfast 2 eggs, 1 toast, 1 fruit OR grits or oatmeal OR black coffee    Snack (morning) fruit    Lunch 2 vegetables, sometimes has a meat, cooks in Theme park manager (afternoon) none    Dinner salad, no sugar salad dressing    Snack (evening) fruit    Beverage(s) water, occassional no-sugar drinks      Exercise   Exercise Type ADL's   recent back surgery   How many days per week to you exercise? 0    How many minutes per day do you exercise? 0    Total minutes per week of exercise 0      Patient Education  Previous Diabetes Education Yes (please comment)   hospital adx 2022   Disease state  Definition of diabetes, type 1 and 2, and the diagnosis of diabetes    Nutrition management  Role of diet in the treatment of diabetes and the relationship between the three main macronutrients and blood glucose level;Carbohydrate counting;Other (comment)   sodium   Physical activity and exercise  Role of exercise on diabetes management, blood pressure control and cardiac health.    Chronic complications Assessed and discussed foot care and prevention of foot problems      Individualized Goals (developed by patient)   Nutrition General guidelines for healthy choices and portions discussed    Physical Activity 30 minutes per day    Medications take my medication as prescribed    Monitoring  test my blood glucose as discussed    Reducing Risk Other (comment)   reducing sodium intake     Outcomes   Expected Outcomes Demonstrated interest in learning. Expect positive outcomes    Future DMSE 4-6 wks    Program  Status Not Completed            Individualized Plan for Diabetes Self-Management Training:   Learning Objective:  Patient will have a greater understanding of diabetes self-management. Patient education plan is to attend individual and/or group sessions per assessed needs and concerns.   Plan:   Patient Instructions  Goals you have set: Call insurance about getting a continuous glucose monitor Talk to MD about medications Continue making healthier choices Continue eating plenty of non-starch vegetables To reduce sodium start rinsing off canned vegetables Ask at restaurants to leave out the salt when preparing foods walking 30 minutes per day as tolerated  Expected Outcomes:  Demonstrated interest in learning. Expect positive outcomes  Education material provided: ADA - How to Thrive: A Guide for Your Journey with Diabetes, A1C conversion sheet, and Carbohydrate counting sheet  If problems or questions, patient to contact team via:  Phone and MyChart  Future DSME appointment: 4-6 wks

## 2021-03-02 NOTE — Patient Instructions (Signed)
Goals you have set: Call insurance about getting a continuous glucose monitor Talk to MD about medications Continue making healthier choices Continue eating plenty of non-starch vegetables To reduce sodium start rinsing off canned vegetables Ask at restaurants to leave out the salt when preparing foods walking 30 minutes per day as tolerated

## 2021-03-06 DIAGNOSIS — I10 Essential (primary) hypertension: Secondary | ICD-10-CM | POA: Diagnosis not present

## 2021-03-06 DIAGNOSIS — M5416 Radiculopathy, lumbar region: Secondary | ICD-10-CM | POA: Diagnosis not present

## 2021-03-09 ENCOUNTER — Other Ambulatory Visit: Payer: Self-pay

## 2021-03-09 ENCOUNTER — Ambulatory Visit (INDEPENDENT_AMBULATORY_CARE_PROVIDER_SITE_OTHER): Payer: Medicare Other | Admitting: Podiatry

## 2021-03-09 ENCOUNTER — Encounter: Payer: Self-pay | Admitting: Podiatry

## 2021-03-09 DIAGNOSIS — G609 Hereditary and idiopathic neuropathy, unspecified: Secondary | ICD-10-CM

## 2021-03-09 DIAGNOSIS — M79675 Pain in left toe(s): Secondary | ICD-10-CM

## 2021-03-09 DIAGNOSIS — E1142 Type 2 diabetes mellitus with diabetic polyneuropathy: Secondary | ICD-10-CM | POA: Diagnosis not present

## 2021-03-09 DIAGNOSIS — M79674 Pain in right toe(s): Secondary | ICD-10-CM | POA: Diagnosis not present

## 2021-03-09 DIAGNOSIS — B351 Tinea unguium: Secondary | ICD-10-CM

## 2021-03-09 NOTE — Progress Notes (Signed)
This patient returns to the office for evaluation and treatment of long thick painful nails .  This patient is unable to trim his own nails since the patient cannot reach his feet.  Patient says the nails are painful walking and wearing his shoes. Patient has history of back surgery.   He returns for preventive foot care services.  General Appearance  Alert, conversant and in no acute stress.  Vascular  Dorsalis pedis and posterior tibial  pulses are palpable  bilaterally.  Capillary return is within normal limits  bilaterally. Temperature is within normal limits  bilaterally.  Neurologic  Senn-Weinstein monofilament wire test diminished  bilaterally. Muscle power within normal limits bilaterally.  Nails Thick disfigured discolored nails with subungual debris  from hallux to second  toes bilaterally. No evidence of bacterial infection or drainage bilaterally.  Orthopedic  No limitations of motion  feet .  No crepitus or effusions noted.  No bony pathology or digital deformities noted. Prominent metatarsal heads  B/L.  Skin  Dry scaly  skin with no porokeratosis noted bilaterally.  No signs of infections or ulcers noted.     Onychomycosis  Pain in toes right foot  Pain in toes left foot  Debridement  of nails  1-5  B/L with a nail nipper.  Nails were then filed using a dremel tool with no incidents.   RTC 3 months    Gardiner Barefoot DPM

## 2021-03-23 DIAGNOSIS — M353 Polymyalgia rheumatica: Secondary | ICD-10-CM | POA: Diagnosis not present

## 2021-03-23 DIAGNOSIS — E1169 Type 2 diabetes mellitus with other specified complication: Secondary | ICD-10-CM | POA: Diagnosis not present

## 2021-03-23 DIAGNOSIS — E0859 Diabetes mellitus due to underlying condition with other circulatory complications: Secondary | ICD-10-CM | POA: Diagnosis not present

## 2021-03-23 DIAGNOSIS — N1832 Chronic kidney disease, stage 3b: Secondary | ICD-10-CM | POA: Diagnosis not present

## 2021-03-23 DIAGNOSIS — I129 Hypertensive chronic kidney disease with stage 1 through stage 4 chronic kidney disease, or unspecified chronic kidney disease: Secondary | ICD-10-CM | POA: Diagnosis not present

## 2021-03-23 DIAGNOSIS — Z794 Long term (current) use of insulin: Secondary | ICD-10-CM | POA: Diagnosis not present

## 2021-03-23 DIAGNOSIS — I5030 Unspecified diastolic (congestive) heart failure: Secondary | ICD-10-CM | POA: Diagnosis not present

## 2021-04-01 DIAGNOSIS — I10 Essential (primary) hypertension: Secondary | ICD-10-CM | POA: Diagnosis not present

## 2021-04-08 DIAGNOSIS — Z23 Encounter for immunization: Secondary | ICD-10-CM | POA: Diagnosis not present

## 2021-04-13 DIAGNOSIS — M1711 Unilateral primary osteoarthritis, right knee: Secondary | ICD-10-CM | POA: Diagnosis not present

## 2021-04-13 DIAGNOSIS — M25512 Pain in left shoulder: Secondary | ICD-10-CM | POA: Diagnosis not present

## 2021-04-15 DIAGNOSIS — Z794 Long term (current) use of insulin: Secondary | ICD-10-CM | POA: Diagnosis not present

## 2021-04-15 DIAGNOSIS — E1140 Type 2 diabetes mellitus with diabetic neuropathy, unspecified: Secondary | ICD-10-CM | POA: Diagnosis not present

## 2021-04-20 DIAGNOSIS — I10 Essential (primary) hypertension: Secondary | ICD-10-CM | POA: Diagnosis not present

## 2021-04-20 DIAGNOSIS — Z6827 Body mass index (BMI) 27.0-27.9, adult: Secondary | ICD-10-CM | POA: Diagnosis not present

## 2021-04-20 DIAGNOSIS — M5416 Radiculopathy, lumbar region: Secondary | ICD-10-CM | POA: Diagnosis not present

## 2021-04-20 DIAGNOSIS — M1711 Unilateral primary osteoarthritis, right knee: Secondary | ICD-10-CM | POA: Diagnosis not present

## 2021-04-21 DIAGNOSIS — L308 Other specified dermatitis: Secondary | ICD-10-CM | POA: Diagnosis not present

## 2021-04-27 ENCOUNTER — Other Ambulatory Visit: Payer: Self-pay

## 2021-04-27 ENCOUNTER — Encounter: Payer: Medicare Other | Attending: Internal Medicine | Admitting: Registered"

## 2021-04-27 ENCOUNTER — Encounter: Payer: Self-pay | Admitting: Registered"

## 2021-04-27 DIAGNOSIS — E1165 Type 2 diabetes mellitus with hyperglycemia: Secondary | ICD-10-CM | POA: Diagnosis not present

## 2021-04-27 DIAGNOSIS — IMO0002 Reserved for concepts with insufficient information to code with codable children: Secondary | ICD-10-CM

## 2021-04-27 DIAGNOSIS — E1140 Type 2 diabetes mellitus with diabetic neuropathy, unspecified: Secondary | ICD-10-CM | POA: Diagnosis not present

## 2021-04-27 DIAGNOSIS — M1711 Unilateral primary osteoarthritis, right knee: Secondary | ICD-10-CM | POA: Diagnosis not present

## 2021-04-27 NOTE — Patient Instructions (Addendum)
Consider using fruits and vegetables infuse flavor into water.  Keep fruit to just 1 serving per day and include protein with it.  Consider checking your blood sugar after some meals. Start timing from when you start eating and 2 hours later check your blood sugar. The goal 180 mg/dL or less. If higher, start measuring the carbohydrate.  If you need to start carb counting, come in for follow-up visit to get more instruction.  Call your doctor to see if you need to come in sooner to talk about mediations since your pharmacist had made some recommendations.

## 2021-04-27 NOTE — Progress Notes (Signed)
Diabetes Self-Management Education  Visit Type: Follow-up  Appt. Start Time: 1050 Appt. End Time: 2505  04/27/2021  [name], identified by name and date of birth, is a 80 y.o. male with a diagnosis of Diabetes: Type 2.   ASSESSMENT  There were no vitals taken for this visit. There is no height or weight on file to calculate BMI.  A1c 9.5% Lantus 8 units (per patient after visit with pharmacist last week)  Pt reports next MD appointment in January.  Pt reports he saw a pharmacist last week after wearing a CGM for a week and she lowered insulin from 10 - 8 units, and does injection in the morning instead of at night because blood sugar was going low at night. Pt denies hypoglycemic symptoms. Pt reports sleeping better since insulin change  SMBG: 135-160 mg/dL fasting.  Pt reports before insulin change FBS was not going over 130 mg/dL.  Pt states he thinks blood sugar also going up because he started eating more sweets but still not a lot. Pt reports he is probably eating too much fruit: watermellon, berries, bananas, or peaches. Pt states he eats oatmeal because he believes it is good for him, but he doesn't like it and has to add fruit and eats a very small serving but  is hungry again around 10 am and eats a snack, peanut butter cracker or fruit.  Dinner last night was 1 1/2 c rice, stewed beef, 1 c green peas, water. Pt states he doesn't eat rice often and avoids potatoes. Pt reports he has a lot of vegetables in his freezer and enjoys a variety.  Post back surgery: pt states he has been wearing back brace x3 months and scheduled to wear 3 more months. Pt reports lifting puts pressure on hips and restricted to lifting less than 15-20 lbs.   Pt states this morning he had is regularly scheduled knee injection, pt states sometimes it helps.   Diabetes Self-Management Education - 04/27/21 1149       Visit Information   Visit Type Follow-up      Initial Visit   Diabetes  Type Type 2      Complications   How often do you check your blood sugar? 3-4 times / week    Fasting Blood glucose range (mg/dL) 130-179   135-160 since lantus was reduced     Dietary Intake   Breakfast oatmeal, berries    Snack (morning) peanut butter crackers    Snack (afternoon) fruit    Dinner stewed beef, 1 1/2 c rice, 1 c green peas    Snack (evening) sometimes fruit or a little something sweet    Beverage(s) water, sugar free drinks      Exercise   Exercise Type ADL's      Patient Education   Nutrition management  Role of diet in the treatment of diabetes and the relationship between the three main macronutrients and blood glucose level;Other (comment)   carbohydrate sources, intro to carb counting   Monitoring Purpose and frequency of SMBG.;Identified appropriate SMBG and/or A1C goals.      Individualized Goals (developed by patient)   Nutrition General guidelines for healthy choices and portions discussed    Medications Other (comment)   contact MD     Outcomes   Expected Outcomes Demonstrated interest in learning. Expect positive outcomes    Future DMSE PRN    Program Status Not Completed      Subsequent Visit   Since your  last visit have you continued or begun to take your medications as prescribed? Yes   lantus            Individualized Plan for Diabetes Self-Management Training:   Learning Objective:  Patient will have a greater understanding of diabetes self-management. Patient education plan is to attend individual and/or group sessions per assessed needs and concerns.   Patient Instructions  Consider using fruits and vegetables infuse flavor into water.  Keep fruit to just 1 serving per day and include protein with it.  Consider checking your blood sugar after some meals. Start timing from when you start eating and 2 hours later check your blood sugar. The goal 180 mg/dL or less. If higher, start measuring the carbohydrate.  If you need to start  carb counting, come in for follow-up visit to get more instruction.  Call your doctor to see if you need to come in sooner to talk about mediations since your pharmacist had made some recommendations.  Expected Outcomes:  Demonstrated interest in learning. Expect positive outcomes  Education material provided: encouraged patient go review handouts from last visit and get familiar with foods that contain carbohydrates.  If problems or questions, patient to contact team via:  Phone  Future DSME appointment: PRN

## 2021-04-30 DIAGNOSIS — N1832 Chronic kidney disease, stage 3b: Secondary | ICD-10-CM | POA: Diagnosis not present

## 2021-04-30 DIAGNOSIS — E0869 Diabetes mellitus due to underlying condition with other specified complication: Secondary | ICD-10-CM | POA: Diagnosis not present

## 2021-04-30 DIAGNOSIS — E1140 Type 2 diabetes mellitus with diabetic neuropathy, unspecified: Secondary | ICD-10-CM | POA: Diagnosis not present

## 2021-04-30 DIAGNOSIS — I10 Essential (primary) hypertension: Secondary | ICD-10-CM | POA: Diagnosis not present

## 2021-04-30 DIAGNOSIS — M10372 Gout due to renal impairment, left ankle and foot: Secondary | ICD-10-CM | POA: Diagnosis not present

## 2021-04-30 DIAGNOSIS — I129 Hypertensive chronic kidney disease with stage 1 through stage 4 chronic kidney disease, or unspecified chronic kidney disease: Secondary | ICD-10-CM | POA: Diagnosis not present

## 2021-04-30 DIAGNOSIS — K219 Gastro-esophageal reflux disease without esophagitis: Secondary | ICD-10-CM | POA: Diagnosis not present

## 2021-05-18 ENCOUNTER — Emergency Department (HOSPITAL_COMMUNITY): Payer: No Typology Code available for payment source

## 2021-05-18 ENCOUNTER — Encounter: Payer: Self-pay | Admitting: Internal Medicine

## 2021-05-18 ENCOUNTER — Encounter (HOSPITAL_COMMUNITY): Payer: Self-pay

## 2021-05-18 ENCOUNTER — Inpatient Hospital Stay (HOSPITAL_COMMUNITY)
Admission: EM | Admit: 2021-05-18 | Discharge: 2021-05-21 | DRG: 576 | Disposition: A | Discharge status: Home Health | Payer: No Typology Code available for payment source | Attending: Internal Medicine | Admitting: Internal Medicine

## 2021-05-18 ENCOUNTER — Encounter (HOSPITAL_COMMUNITY): Payer: Self-pay | Admitting: Internal Medicine

## 2021-05-18 ENCOUNTER — Other Ambulatory Visit: Payer: Self-pay

## 2021-05-18 DIAGNOSIS — E1159 Type 2 diabetes mellitus with other circulatory complications: Secondary | ICD-10-CM | POA: Diagnosis present

## 2021-05-18 DIAGNOSIS — N184 Chronic kidney disease, stage 4 (severe): Secondary | ICD-10-CM | POA: Insufficient documentation

## 2021-05-18 DIAGNOSIS — G4733 Obstructive sleep apnea (adult) (pediatric): Secondary | ICD-10-CM | POA: Insufficient documentation

## 2021-05-18 DIAGNOSIS — J9601 Acute respiratory failure with hypoxia: Secondary | ICD-10-CM | POA: Diagnosis present

## 2021-05-18 DIAGNOSIS — S81819A Laceration without foreign body, unspecified lower leg, initial encounter: Secondary | ICD-10-CM | POA: Diagnosis present

## 2021-05-18 DIAGNOSIS — I5032 Chronic diastolic (congestive) heart failure: Secondary | ICD-10-CM | POA: Diagnosis present

## 2021-05-18 DIAGNOSIS — R0902 Hypoxemia: Secondary | ICD-10-CM

## 2021-05-18 DIAGNOSIS — I152 Hypertension secondary to endocrine disorders: Secondary | ICD-10-CM | POA: Diagnosis present

## 2021-05-18 DIAGNOSIS — T1490XA Injury, unspecified, initial encounter: Secondary | ICD-10-CM

## 2021-05-18 DIAGNOSIS — E119 Type 2 diabetes mellitus without complications: Secondary | ICD-10-CM

## 2021-05-18 DIAGNOSIS — S81811A Laceration without foreign body, right lower leg, initial encounter: Secondary | ICD-10-CM | POA: Diagnosis present

## 2021-05-18 DIAGNOSIS — N179 Acute kidney failure, unspecified: Secondary | ICD-10-CM | POA: Diagnosis present

## 2021-05-18 DIAGNOSIS — M25519 Pain in unspecified shoulder: Secondary | ICD-10-CM | POA: Diagnosis not present

## 2021-05-18 DIAGNOSIS — I5033 Acute on chronic diastolic (congestive) heart failure: Secondary | ICD-10-CM | POA: Diagnosis not present

## 2021-05-18 DIAGNOSIS — M160 Bilateral primary osteoarthritis of hip: Secondary | ICD-10-CM | POA: Diagnosis not present

## 2021-05-18 DIAGNOSIS — Z87891 Personal history of nicotine dependence: Secondary | ICD-10-CM

## 2021-05-18 DIAGNOSIS — Z20822 Contact with and (suspected) exposure to covid-19: Secondary | ICD-10-CM | POA: Diagnosis not present

## 2021-05-18 DIAGNOSIS — K4090 Unilateral inguinal hernia, without obstruction or gangrene, not specified as recurrent: Secondary | ICD-10-CM | POA: Diagnosis not present

## 2021-05-18 DIAGNOSIS — R0689 Other abnormalities of breathing: Secondary | ICD-10-CM | POA: Diagnosis not present

## 2021-05-18 DIAGNOSIS — E1122 Type 2 diabetes mellitus with diabetic chronic kidney disease: Secondary | ICD-10-CM | POA: Diagnosis not present

## 2021-05-18 DIAGNOSIS — N189 Chronic kidney disease, unspecified: Secondary | ICD-10-CM | POA: Diagnosis not present

## 2021-05-18 DIAGNOSIS — M50222 Other cervical disc displacement at C5-C6 level: Secondary | ICD-10-CM | POA: Diagnosis not present

## 2021-05-18 DIAGNOSIS — M7989 Other specified soft tissue disorders: Secondary | ICD-10-CM | POA: Diagnosis not present

## 2021-05-18 DIAGNOSIS — S81812A Laceration without foreign body, left lower leg, initial encounter: Secondary | ICD-10-CM | POA: Diagnosis not present

## 2021-05-18 DIAGNOSIS — G5793 Unspecified mononeuropathy of bilateral lower limbs: Secondary | ICD-10-CM | POA: Diagnosis present

## 2021-05-18 DIAGNOSIS — Z9989 Dependence on other enabling machines and devices: Secondary | ICD-10-CM | POA: Diagnosis not present

## 2021-05-18 DIAGNOSIS — S0990XA Unspecified injury of head, initial encounter: Secondary | ICD-10-CM | POA: Diagnosis not present

## 2021-05-18 DIAGNOSIS — M533 Sacrococcygeal disorders, not elsewhere classified: Secondary | ICD-10-CM | POA: Diagnosis not present

## 2021-05-18 DIAGNOSIS — Z6833 Body mass index (BMI) 33.0-33.9, adult: Secondary | ICD-10-CM | POA: Diagnosis not present

## 2021-05-18 DIAGNOSIS — S299XXA Unspecified injury of thorax, initial encounter: Secondary | ICD-10-CM | POA: Diagnosis not present

## 2021-05-18 DIAGNOSIS — M2578 Osteophyte, vertebrae: Secondary | ICD-10-CM | POA: Diagnosis not present

## 2021-05-18 DIAGNOSIS — Z79899 Other long term (current) drug therapy: Secondary | ICD-10-CM

## 2021-05-18 DIAGNOSIS — M4802 Spinal stenosis, cervical region: Secondary | ICD-10-CM | POA: Diagnosis not present

## 2021-05-18 DIAGNOSIS — K861 Other chronic pancreatitis: Secondary | ICD-10-CM | POA: Diagnosis present

## 2021-05-18 DIAGNOSIS — Z794 Long term (current) use of insulin: Secondary | ICD-10-CM | POA: Diagnosis not present

## 2021-05-18 DIAGNOSIS — E1141 Type 2 diabetes mellitus with diabetic mononeuropathy: Secondary | ICD-10-CM | POA: Diagnosis present

## 2021-05-18 DIAGNOSIS — E1169 Type 2 diabetes mellitus with other specified complication: Secondary | ICD-10-CM | POA: Diagnosis present

## 2021-05-18 DIAGNOSIS — Z041 Encounter for examination and observation following transport accident: Secondary | ICD-10-CM | POA: Diagnosis not present

## 2021-05-18 DIAGNOSIS — S50819A Abrasion of unspecified forearm, initial encounter: Secondary | ICD-10-CM | POA: Diagnosis present

## 2021-05-18 DIAGNOSIS — S41111A Laceration without foreign body of right upper arm, initial encounter: Secondary | ICD-10-CM | POA: Diagnosis present

## 2021-05-18 DIAGNOSIS — Z7952 Long term (current) use of systemic steroids: Secondary | ICD-10-CM

## 2021-05-18 DIAGNOSIS — I10 Essential (primary) hypertension: Secondary | ICD-10-CM | POA: Diagnosis not present

## 2021-05-18 DIAGNOSIS — E876 Hypokalemia: Secondary | ICD-10-CM | POA: Diagnosis present

## 2021-05-18 DIAGNOSIS — Y92410 Unspecified street and highway as the place of occurrence of the external cause: Secondary | ICD-10-CM

## 2021-05-18 DIAGNOSIS — K5730 Diverticulosis of large intestine without perforation or abscess without bleeding: Secondary | ICD-10-CM | POA: Diagnosis not present

## 2021-05-18 DIAGNOSIS — Z981 Arthrodesis status: Secondary | ICD-10-CM

## 2021-05-18 DIAGNOSIS — I700 Atherosclerosis of aorta: Secondary | ICD-10-CM | POA: Diagnosis not present

## 2021-05-18 DIAGNOSIS — I130 Hypertensive heart and chronic kidney disease with heart failure and stage 1 through stage 4 chronic kidney disease, or unspecified chronic kidney disease: Secondary | ICD-10-CM | POA: Diagnosis present

## 2021-05-18 DIAGNOSIS — S41112A Laceration without foreign body of left upper arm, initial encounter: Secondary | ICD-10-CM | POA: Diagnosis present

## 2021-05-18 DIAGNOSIS — I5042 Chronic combined systolic (congestive) and diastolic (congestive) heart failure: Secondary | ICD-10-CM | POA: Diagnosis present

## 2021-05-18 DIAGNOSIS — G894 Chronic pain syndrome: Secondary | ICD-10-CM | POA: Diagnosis present

## 2021-05-18 DIAGNOSIS — M19042 Primary osteoarthritis, left hand: Secondary | ICD-10-CM | POA: Diagnosis not present

## 2021-05-18 DIAGNOSIS — E669 Obesity, unspecified: Secondary | ICD-10-CM | POA: Diagnosis present

## 2021-05-18 DIAGNOSIS — S199XXA Unspecified injury of neck, initial encounter: Secondary | ICD-10-CM | POA: Diagnosis not present

## 2021-05-18 DIAGNOSIS — R000 Tachycardia, unspecified: Secondary | ICD-10-CM | POA: Diagnosis not present

## 2021-05-18 DIAGNOSIS — Z8249 Family history of ischemic heart disease and other diseases of the circulatory system: Secondary | ICD-10-CM

## 2021-05-18 DIAGNOSIS — S81801A Unspecified open wound, right lower leg, initial encounter: Secondary | ICD-10-CM | POA: Diagnosis not present

## 2021-05-18 DIAGNOSIS — I672 Cerebral atherosclerosis: Secondary | ICD-10-CM | POA: Diagnosis not present

## 2021-05-18 DIAGNOSIS — I2510 Atherosclerotic heart disease of native coronary artery without angina pectoris: Secondary | ICD-10-CM | POA: Diagnosis not present

## 2021-05-18 HISTORY — DX: Hypertension secondary to endocrine disorders: E11.59

## 2021-05-18 HISTORY — DX: Chronic kidney disease, stage 4 (severe): N18.4

## 2021-05-18 HISTORY — DX: Hypertension secondary to endocrine disorders: I15.2

## 2021-05-18 HISTORY — DX: Type 2 diabetes mellitus without complications: Z79.4

## 2021-05-18 HISTORY — DX: Chronic diastolic (congestive) heart failure: I50.32

## 2021-05-18 HISTORY — DX: Obstructive sleep apnea (adult) (pediatric): G47.33

## 2021-05-18 HISTORY — DX: Type 2 diabetes mellitus without complications: E11.9

## 2021-05-18 HISTORY — DX: Type 2 diabetes mellitus with other circulatory complications: E11.59

## 2021-05-18 LAB — URINALYSIS, ROUTINE W REFLEX MICROSCOPIC
Bacteria, UA: NONE SEEN
Bilirubin Urine: NEGATIVE
Glucose, UA: 50 mg/dL — AB
Hgb urine dipstick: NEGATIVE
Ketones, ur: NEGATIVE mg/dL
Leukocytes,Ua: NEGATIVE
Nitrite: NEGATIVE
Protein, ur: 30 mg/dL — AB
Specific Gravity, Urine: 1.009 (ref 1.005–1.030)
pH: 6 (ref 5.0–8.0)

## 2021-05-18 LAB — COMPREHENSIVE METABOLIC PANEL
ALT: 28 U/L (ref 0–44)
AST: 34 U/L (ref 15–41)
Albumin: 3.2 g/dL — ABNORMAL LOW (ref 3.5–5.0)
Alkaline Phosphatase: 106 U/L (ref 38–126)
Anion gap: 17 — ABNORMAL HIGH (ref 5–15)
BUN: 65 mg/dL — ABNORMAL HIGH (ref 8–23)
CO2: 28 mmol/L (ref 22–32)
Calcium: 9 mg/dL (ref 8.9–10.3)
Chloride: 89 mmol/L — ABNORMAL LOW (ref 98–111)
Creatinine, Ser: 2.71 mg/dL — ABNORMAL HIGH (ref 0.61–1.24)
GFR, Estimated: 23 mL/min — ABNORMAL LOW (ref >60)
Glucose, Bld: 364 mg/dL — ABNORMAL HIGH (ref 70–99)
Potassium: 3.2 mmol/L — ABNORMAL LOW (ref 3.5–5.1)
Sodium: 134 mmol/L — ABNORMAL LOW (ref 135–145)
Total Bilirubin: 0.6 mg/dL (ref 0.3–1.2)
Total Protein: 6.8 g/dL (ref 6.5–8.1)

## 2021-05-18 LAB — I-STAT CHEM 8, ED
BUN: 59 mg/dL — ABNORMAL HIGH (ref 8–23)
Calcium, Ion: 0.99 mmol/L — ABNORMAL LOW (ref 1.15–1.40)
Chloride: 92 mmol/L — ABNORMAL LOW (ref 98–111)
Creatinine, Ser: 2.7 mg/dL — ABNORMAL HIGH (ref 0.61–1.24)
Glucose, Bld: 365 mg/dL — ABNORMAL HIGH (ref 70–99)
HCT: 43 % (ref 39.0–52.0)
Hemoglobin: 14.6 g/dL (ref 13.0–17.0)
Potassium: 3.1 mmol/L — ABNORMAL LOW (ref 3.5–5.1)
Sodium: 133 mmol/L — ABNORMAL LOW (ref 135–145)
TCO2: 30 mmol/L (ref 22–32)

## 2021-05-18 LAB — CBC
HCT: 42.7 % (ref 39.0–52.0)
Hemoglobin: 14.4 g/dL (ref 13.0–17.0)
MCH: 29.3 pg (ref 26.0–34.0)
MCHC: 33.7 g/dL (ref 30.0–36.0)
MCV: 86.8 fL (ref 80.0–100.0)
Platelets: 390 10*3/uL (ref 150–400)
RBC: 4.92 MIL/uL (ref 4.22–5.81)
RDW: 16 % — ABNORMAL HIGH (ref 11.5–15.5)
WBC: 10.8 10*3/uL — ABNORMAL HIGH (ref 4.0–10.5)
nRBC: 0 % (ref 0.0–0.2)

## 2021-05-18 LAB — PROTIME-INR
INR: 1.1 (ref 0.8–1.2)
Prothrombin Time: 14.3 seconds (ref 11.4–15.2)

## 2021-05-18 LAB — LACTIC ACID, PLASMA
Lactic Acid, Venous: 3.7 mmol/L (ref 0.5–1.9)
Lactic Acid, Venous: 3.3 mmol/L (ref 0.5–1.9)

## 2021-05-18 LAB — ETHANOL: Alcohol, Ethyl (B): <10 mg/dL (ref <10)

## 2021-05-18 LAB — RESP PANEL BY RT-PCR (FLU A&B, COVID) ARPGX2
Influenza A by PCR: NEGATIVE
Influenza B by PCR: NEGATIVE
SARS Coronavirus 2 by RT PCR: NEGATIVE

## 2021-05-18 MED ORDER — ACETAMINOPHEN 650 MG RE SUPP: 650.0000 mg | Freq: Four times a day (QID) | RECTAL | Status: DC | PRN

## 2021-05-18 MED ORDER — LACTATED RINGERS IV BOLUS
1000.0000 mL | Freq: Once | INTRAVENOUS | Status: AC
  Administered 2021-05-18: 1000 mL via INTRAVENOUS

## 2021-05-18 MED ORDER — SODIUM CHLORIDE 0.9% FLUSH
3.0000 mL | Freq: Two times a day (BID) | INTRAVENOUS | Status: DC
  Administered 2021-05-19 – 2021-05-21 (×6): 3 mL via INTRAVENOUS

## 2021-05-18 MED ORDER — INSULIN GLARGINE-YFGN 100 UNIT/ML ~~LOC~~ SOLN
10.0000 [IU] | Freq: Every day | SUBCUTANEOUS | Status: DC
  Administered 2021-05-19 – 2021-05-21 (×3): 10 [IU] via SUBCUTANEOUS
  Filled 2021-05-18 (×4): qty 0.1

## 2021-05-18 MED ORDER — HYDROMORPHONE HCL 1 MG/ML IJ SOLN
0.5000 mg | INTRAMUSCULAR | Status: AC | PRN
  Administered 2021-05-19 (×2): 0.5 mg via INTRAVENOUS
  Filled 2021-05-18 (×2): qty 1

## 2021-05-18 MED ORDER — LIDOCAINE HCL (PF) 1 % IJ SOLN
10.0000 mL | Freq: Once | INTRAMUSCULAR | Status: AC
  Administered 2021-05-18: 10 mL via INTRADERMAL
  Filled 2021-05-18: qty 10

## 2021-05-18 MED ORDER — METOLAZONE 5 MG PO TABS
5.0000 mg | ORAL_TABLET | Freq: Every day | ORAL | Status: DC
  Administered 2021-05-19 – 2021-05-20 (×3): 5 mg via ORAL
  Filled 2021-05-18 (×4): qty 1

## 2021-05-18 MED ORDER — ONDANSETRON HCL 4 MG/2ML IJ SOLN
4.0000 mg | Freq: Four times a day (QID) | INTRAMUSCULAR | Status: DC | PRN
  Administered 2021-05-19 (×2): 4 mg via INTRAVENOUS
  Filled 2021-05-18 (×2): qty 2

## 2021-05-18 MED ORDER — FENTANYL CITRATE PF 50 MCG/ML IJ SOSY
PREFILLED_SYRINGE | INTRAMUSCULAR | Status: AC
  Filled 2021-05-18: qty 1

## 2021-05-18 MED ORDER — LIDOCAINE HCL (PF) 1 % IJ SOLN
10.0000 mL | Freq: Once | INTRAMUSCULAR | Status: DC
  Filled 2021-05-18: qty 10

## 2021-05-18 MED ORDER — PREGABALIN 75 MG PO CAPS
75.0000 mg | ORAL_CAPSULE | Freq: Two times a day (BID) | ORAL | Status: DC
  Administered 2021-05-19 – 2021-05-21 (×6): 75 mg via ORAL
  Filled 2021-05-18: qty 1
  Filled 2021-05-18: qty 3
  Filled 2021-05-18 (×3): qty 1
  Filled 2021-05-18: qty 3

## 2021-05-18 MED ORDER — OXYCODONE HCL 5 MG PO TABS
5.0000 mg | ORAL_TABLET | ORAL | Status: DC | PRN
  Administered 2021-05-19 – 2021-05-21 (×6): 5 mg via ORAL
  Filled 2021-05-18 (×6): qty 1

## 2021-05-18 MED ORDER — BACITRACIN ZINC 500 UNIT/GM EX OINT: TOPICAL_OINTMENT | Freq: Once | CUTANEOUS | Status: AC

## 2021-05-18 MED ORDER — AMLODIPINE BESYLATE 10 MG PO TABS
10.0000 mg | ORAL_TABLET | Freq: Every day | ORAL | Status: DC
  Administered 2021-05-19 – 2021-05-21 (×3): 10 mg via ORAL
  Filled 2021-05-18: qty 2
  Filled 2021-05-18 (×2): qty 1

## 2021-05-18 MED ORDER — OXYCODONE-ACETAMINOPHEN 5-325 MG PO TABS
1.0000 | ORAL_TABLET | Freq: Once | ORAL | Status: AC
Start: 2021-05-18 — End: 2021-05-18
  Administered 2021-05-18: 1 via ORAL
  Filled 2021-05-18: qty 1

## 2021-05-18 MED ORDER — ONDANSETRON HCL 4 MG PO TABS: 4.0000 mg | ORAL_TABLET | Freq: Four times a day (QID) | ORAL | Status: DC | PRN

## 2021-05-18 MED ORDER — CEFAZOLIN SODIUM-DEXTROSE 1-4 GM/50ML-% IV SOLN
1.0000 g | Freq: Once | INTRAVENOUS | Status: AC
  Administered 2021-05-18: 1 g via INTRAVENOUS
  Filled 2021-05-18: qty 50

## 2021-05-18 MED ORDER — ACETAMINOPHEN 500 MG PO TABS
1000.0000 mg | ORAL_TABLET | Freq: Four times a day (QID) | ORAL | Status: DC | PRN
  Administered 2021-05-20: 1000 mg via ORAL
  Filled 2021-05-18: qty 2

## 2021-05-18 MED ORDER — PREDNISONE 5 MG PO TABS
5.0000 mg | ORAL_TABLET | Freq: Every day | ORAL | Status: DC
  Administered 2021-05-19 – 2021-05-21 (×3): 5 mg via ORAL
  Filled 2021-05-18 (×3): qty 1

## 2021-05-18 MED ORDER — SENNOSIDES-DOCUSATE SODIUM 8.6-50 MG PO TABS: 1.0000 | ORAL_TABLET | Freq: Every evening | ORAL | Status: DC | PRN

## 2021-05-18 MED ORDER — INSULIN ASPART 100 UNIT/ML IJ SOLN
0.0000 [IU] | Freq: Every day | INTRAMUSCULAR | Status: DC
  Administered 2021-05-19: 2 [IU] via SUBCUTANEOUS
  Administered 2021-05-20: 4 [IU] via SUBCUTANEOUS

## 2021-05-18 MED ORDER — POTASSIUM CHLORIDE 20 MEQ PO PACK
40.0000 meq | PACK | Freq: Once | ORAL | Status: AC
  Administered 2021-05-19: 40 meq via ORAL
  Filled 2021-05-18: qty 2

## 2021-05-18 MED ORDER — FUROSEMIDE 40 MG PO TABS
80.0000 mg | ORAL_TABLET | Freq: Every day | ORAL | Status: DC
  Administered 2021-05-19 – 2021-05-21 (×3): 80 mg via ORAL
  Filled 2021-05-18: qty 2
  Filled 2021-05-18: qty 4
  Filled 2021-05-18: qty 2

## 2021-05-18 MED ORDER — INSULIN ASPART 100 UNIT/ML IJ SOLN
0.0000 [IU] | Freq: Three times a day (TID) | INTRAMUSCULAR | Status: DC
  Administered 2021-05-19 (×2): 2 [IU] via SUBCUTANEOUS
  Administered 2021-05-19: 5 [IU] via SUBCUTANEOUS
  Administered 2021-05-20 (×2): 2 [IU] via SUBCUTANEOUS
  Administered 2021-05-21 (×2): 3 [IU] via SUBCUTANEOUS
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
## 2021-05-18 NOTE — ED Notes (Signed)
Admitting MD at bedside discussing plan of care with patient
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
## 2021-05-18 NOTE — ED Provider Notes (Signed)
..  Laceration Repair  Date/Time: 05/19/2021 12:15 AM Performed by: Coralee Pesa, MD Authorized by: Lenore Cordia, MD   Consent:    Consent obtained:  Verbal   Consent given by:  Patient   Risks, benefits, and alternatives were discussed: yes     Risks discussed:  Infection, pain, poor cosmetic result and poor wound healing Universal protocol:    Patient identity confirmed:  Verbally with patient Anesthesia:    Anesthesia method:  Local infiltration   Local anesthetic:  Lidocaine 1% w/o epi Laceration details:    Location:  Leg   Leg location:  R lower leg   Length (cm):  6   Depth (mm):  1 Treatment:    Area cleansed with:  Saline   Amount of cleaning:  Extensive   Irrigation solution:  Sterile saline   Irrigation volume:  1000   Irrigation method:  Syringe Skin repair:    Repair method:  Sutures   Suture size:  3-0   Suture material:  Prolene   Suture technique:  Horizontal mattress and simple interrupted   Number of sutures:  5 Approximation:    Approximation:  Close Repair type:    Repair type:  Complex Post-procedure details:    Dressing:  Antibiotic ointment   Procedure completion:  Tolerated Comments:     Multiple attempts to close wound but skin too thin and likely not vascular enough for good wound healing. Irrigated extensively. Able to approximately medial aspect of wound but lateral aspect skin too thin for closure. Left open with saline soaked gauze on top per orthopedics.       Coralee Pesa, MD 05/19/21 0018    Teressa Lower, MD 05/21/21 [phone]
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
## 2021-05-18 NOTE — ED Notes (Signed)
Transported to imaging.

## 2021-05-19 ENCOUNTER — Observation Stay (HOSPITAL_COMMUNITY): Payer: No Typology Code available for payment source

## 2021-05-19 DIAGNOSIS — Z794 Long term (current) use of insulin: Secondary | ICD-10-CM

## 2021-05-19 DIAGNOSIS — E119 Type 2 diabetes mellitus without complications: Secondary | ICD-10-CM

## 2021-05-19 DIAGNOSIS — J9601 Acute respiratory failure with hypoxia: Secondary | ICD-10-CM | POA: Diagnosis not present

## 2021-05-19 DIAGNOSIS — N179 Acute kidney failure, unspecified: Secondary | ICD-10-CM | POA: Diagnosis not present

## 2021-05-19 DIAGNOSIS — I152 Hypertension secondary to endocrine disorders: Secondary | ICD-10-CM

## 2021-05-19 DIAGNOSIS — N189 Chronic kidney disease, unspecified: Secondary | ICD-10-CM | POA: Diagnosis not present

## 2021-05-19 DIAGNOSIS — G4733 Obstructive sleep apnea (adult) (pediatric): Secondary | ICD-10-CM | POA: Diagnosis not present

## 2021-05-19 DIAGNOSIS — E1159 Type 2 diabetes mellitus with other circulatory complications: Secondary | ICD-10-CM

## 2021-05-19 DIAGNOSIS — Z9989 Dependence on other enabling machines and devices: Secondary | ICD-10-CM | POA: Diagnosis not present

## 2021-05-19 DIAGNOSIS — I5032 Chronic diastolic (congestive) heart failure: Secondary | ICD-10-CM | POA: Diagnosis not present

## 2021-05-19 DIAGNOSIS — R0902 Hypoxemia: Secondary | ICD-10-CM | POA: Diagnosis not present

## 2021-05-19 LAB — HEMOGLOBIN A1C
Hgb A1c MFr Bld: 7.6 % — ABNORMAL HIGH (ref 4.8–5.6)
Mean Plasma Glucose: 171.42 mg/dL

## 2021-05-19 LAB — BASIC METABOLIC PANEL
Anion gap: 14 (ref 5–15)
BUN: 62 mg/dL — ABNORMAL HIGH (ref 8–23)
CO2: 27 mmol/L (ref 22–32)
Calcium: 8.4 mg/dL — ABNORMAL LOW (ref 8.9–10.3)
Chloride: 93 mmol/L — ABNORMAL LOW (ref 98–111)
Creatinine, Ser: 2.42 mg/dL — ABNORMAL HIGH (ref 0.61–1.24)
GFR, Estimated: 27 mL/min — ABNORMAL LOW (ref >60)
Glucose, Bld: 245 mg/dL — ABNORMAL HIGH (ref 70–99)
Potassium: 3 mmol/L — ABNORMAL LOW (ref 3.5–5.1)
Sodium: 134 mmol/L — ABNORMAL LOW (ref 135–145)

## 2021-05-19 LAB — SAMPLE TO BLOOD BANK

## 2021-05-19 LAB — CBG MONITORING, ED
Glucose-Capillary: 225 mg/dL — ABNORMAL HIGH (ref 70–99)
Glucose-Capillary: 189 mg/dL — ABNORMAL HIGH (ref 70–99)
Glucose-Capillary: 169 mg/dL — ABNORMAL HIGH (ref 70–99)
Glucose-Capillary: 257 mg/dL — ABNORMAL HIGH (ref 70–99)

## 2021-05-19 LAB — MAGNESIUM: Magnesium: 2.2 mg/dL (ref 1.7–2.4)

## 2021-05-19 LAB — GLUCOSE, CAPILLARY: Glucose-Capillary: 200 mg/dL — ABNORMAL HIGH (ref 70–99)

## 2021-05-19 MED ORDER — POTASSIUM CHLORIDE CRYS ER 20 MEQ PO TBCR
40.0000 meq | EXTENDED_RELEASE_TABLET | ORAL | Status: AC
Start: 2021-05-19 — End: 2021-05-19
  Administered 2021-05-19 (×2): 40 meq via ORAL
  Filled 2021-05-19 (×2): qty 2

## 2021-05-19 NOTE — Progress Notes (Signed)
Pt refused cpap for the night.  

## 2021-05-19 NOTE — Consult Note (Signed)
WOC Nurse Consult Note: Reason for Consult: Severe laceration of right pretibial area (avulsion). Several sutures (5) placed in ED but further approximation of wound edges was not possible due to fragility of tissue and area of skin loss. Wound type:Trauma Pressure Injury POA: N/A Measurement:Per ED Resident's notes, 6cm with 1cm depth Wound bed: Red, moist Drainage (amount, consistency, odor) serosanguinous Periwound: ecchymosis Dressing procedure/placement/frequency:  Orthopedics has evaluated and recommends Plastic Surgery consultation. I agree with that recommendation.   In the interim, I will provide Nursing staff with guidance for twice daily cleansing with NS and follow with application of folded antimicrobial nonadherent dressing (xeroform). This is to be topped with dry gauze and covered with an ABD pad. Securement will be with a few turns of Kerlix roll gauze/paper tape.   Patient's heels to be floated using bilateral pressure redistribution heel boots and a sacral prophylactic foam dressing is to be used for Pressure Injury prevention.  [place] nursing team will not follow, but will remain available to this patient, the nursing and medical teams.  Please re-consult if needed. Thanks, Maudie Flakes, MSN, RN, [place], [facility]  Pager# [phone]

## 2021-05-19 NOTE — Progress Notes (Signed)
    After speaking with Dr. Sharol Given, he has agreed to take over management of this patient's laceration repair. Please contact him for any further orthopedic questions. Our team will sign off.   Britt Bottom, PA-C Office [phone] 05/19/2021, 2:51 PM
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
## 2021-05-20 ENCOUNTER — Inpatient Hospital Stay (HOSPITAL_COMMUNITY): Payer: No Typology Code available for payment source | Admitting: Anesthesiology

## 2021-05-20 ENCOUNTER — Encounter (HOSPITAL_COMMUNITY): Payer: Self-pay | Admitting: Internal Medicine

## 2021-05-20 ENCOUNTER — Encounter (HOSPITAL_COMMUNITY)
Admission: EM | Disposition: A | Discharge status: Home Health | Payer: Self-pay | Source: Home / Self Care | Attending: Internal Medicine

## 2021-05-20 DIAGNOSIS — S81819A Laceration without foreign body, unspecified lower leg, initial encounter: Secondary | ICD-10-CM | POA: Diagnosis present

## 2021-05-20 DIAGNOSIS — S81812A Laceration without foreign body, left lower leg, initial encounter: Secondary | ICD-10-CM | POA: Diagnosis present

## 2021-05-20 DIAGNOSIS — I5033 Acute on chronic diastolic (congestive) heart failure: Secondary | ICD-10-CM | POA: Diagnosis present

## 2021-05-20 DIAGNOSIS — G894 Chronic pain syndrome: Secondary | ICD-10-CM | POA: Diagnosis present

## 2021-05-20 DIAGNOSIS — N184 Chronic kidney disease, stage 4 (severe): Secondary | ICD-10-CM | POA: Diagnosis present

## 2021-05-20 DIAGNOSIS — S81801A Unspecified open wound, right lower leg, initial encounter: Secondary | ICD-10-CM | POA: Diagnosis not present

## 2021-05-20 DIAGNOSIS — E669 Obesity, unspecified: Secondary | ICD-10-CM | POA: Diagnosis present

## 2021-05-20 DIAGNOSIS — J9601 Acute respiratory failure with hypoxia: Secondary | ICD-10-CM | POA: Diagnosis present

## 2021-05-20 DIAGNOSIS — Y92410 Unspecified street and highway as the place of occurrence of the external cause: Secondary | ICD-10-CM | POA: Diagnosis not present

## 2021-05-20 DIAGNOSIS — I130 Hypertensive heart and chronic kidney disease with heart failure and stage 1 through stage 4 chronic kidney disease, or unspecified chronic kidney disease: Secondary | ICD-10-CM | POA: Diagnosis present

## 2021-05-20 DIAGNOSIS — S50819A Abrasion of unspecified forearm, initial encounter: Secondary | ICD-10-CM | POA: Diagnosis present

## 2021-05-20 DIAGNOSIS — Z981 Arthrodesis status: Secondary | ICD-10-CM | POA: Diagnosis not present

## 2021-05-20 DIAGNOSIS — S81811A Laceration without foreign body, right lower leg, initial encounter: Principal | ICD-10-CM

## 2021-05-20 DIAGNOSIS — E1141 Type 2 diabetes mellitus with diabetic mononeuropathy: Secondary | ICD-10-CM | POA: Diagnosis present

## 2021-05-20 DIAGNOSIS — E1169 Type 2 diabetes mellitus with other specified complication: Secondary | ICD-10-CM | POA: Diagnosis present

## 2021-05-20 DIAGNOSIS — S41112A Laceration without foreign body of left upper arm, initial encounter: Secondary | ICD-10-CM | POA: Diagnosis present

## 2021-05-20 DIAGNOSIS — I152 Hypertension secondary to endocrine disorders: Secondary | ICD-10-CM | POA: Diagnosis present

## 2021-05-20 DIAGNOSIS — E876 Hypokalemia: Secondary | ICD-10-CM | POA: Diagnosis present

## 2021-05-20 DIAGNOSIS — Z87891 Personal history of nicotine dependence: Secondary | ICD-10-CM | POA: Diagnosis not present

## 2021-05-20 DIAGNOSIS — E1122 Type 2 diabetes mellitus with diabetic chronic kidney disease: Secondary | ICD-10-CM | POA: Diagnosis present

## 2021-05-20 DIAGNOSIS — N179 Acute kidney failure, unspecified: Secondary | ICD-10-CM | POA: Diagnosis present

## 2021-05-20 DIAGNOSIS — G5793 Unspecified mononeuropathy of bilateral lower limbs: Secondary | ICD-10-CM | POA: Diagnosis present

## 2021-05-20 DIAGNOSIS — S41111A Laceration without foreign body of right upper arm, initial encounter: Secondary | ICD-10-CM | POA: Diagnosis present

## 2021-05-20 DIAGNOSIS — Z9989 Dependence on other enabling machines and devices: Secondary | ICD-10-CM | POA: Diagnosis not present

## 2021-05-20 DIAGNOSIS — G4733 Obstructive sleep apnea (adult) (pediatric): Secondary | ICD-10-CM | POA: Diagnosis present

## 2021-05-20 DIAGNOSIS — Z20822 Contact with and (suspected) exposure to covid-19: Secondary | ICD-10-CM | POA: Diagnosis present

## 2021-05-20 DIAGNOSIS — K861 Other chronic pancreatitis: Secondary | ICD-10-CM | POA: Diagnosis present

## 2021-05-20 DIAGNOSIS — Z6833 Body mass index (BMI) 33.0-33.9, adult: Secondary | ICD-10-CM | POA: Diagnosis not present

## 2021-05-20 HISTORY — PX: I & D EXTREMITY: SHX5045

## 2021-05-20 LAB — BASIC METABOLIC PANEL
Anion gap: 9 (ref 5–15)
BUN: 57 mg/dL — ABNORMAL HIGH (ref 8–23)
CO2: 28 mmol/L (ref 22–32)
Calcium: 7.9 mg/dL — ABNORMAL LOW (ref 8.9–10.3)
Chloride: 92 mmol/L — ABNORMAL LOW (ref 98–111)
Creatinine, Ser: 2.38 mg/dL — ABNORMAL HIGH (ref 0.61–1.24)
GFR, Estimated: 27 mL/min — ABNORMAL LOW (ref >60)
Glucose, Bld: 168 mg/dL — ABNORMAL HIGH (ref 70–99)
Potassium: 3.3 mmol/L — ABNORMAL LOW (ref 3.5–5.1)
Sodium: 129 mmol/L — ABNORMAL LOW (ref 135–145)

## 2021-05-20 LAB — GLUCOSE, CAPILLARY
Glucose-Capillary: 170 mg/dL — ABNORMAL HIGH (ref 70–99)
Glucose-Capillary: 170 mg/dL — ABNORMAL HIGH (ref 70–99)
Glucose-Capillary: 179 mg/dL — ABNORMAL HIGH (ref 70–99)
Glucose-Capillary: 181 mg/dL — ABNORMAL HIGH (ref 70–99)
Glucose-Capillary: 304 mg/dL — ABNORMAL HIGH (ref 70–99)
Glucose-Capillary: 343 mg/dL — ABNORMAL HIGH (ref 70–99)

## 2021-05-20 LAB — SURGICAL PCR SCREEN
MRSA, PCR: NEGATIVE
Staphylococcus aureus: POSITIVE — AB

## 2021-05-20 SURGERY — IRRIGATION AND DEBRIDEMENT EXTREMITY
Anesthesia: General | Laterality: Right

## 2021-05-20 MED ORDER — POVIDONE-IODINE 10 % EX SWAB
2.0000 "application " | Freq: Once | CUTANEOUS | Status: AC
  Administered 2021-05-20: 2 via TOPICAL

## 2021-05-20 MED ORDER — POLYETHYLENE GLYCOL 3350 17 G PO PACK: 17.0000 g | PACK | Freq: Every day | ORAL | Status: DC | PRN

## 2021-05-20 MED ORDER — ACETAMINOPHEN 160 MG/5ML PO SOLN: 1000.0000 mg | Freq: Once | ORAL | Status: DC | PRN

## 2021-05-20 MED ORDER — ACETAMINOPHEN 10 MG/ML IV SOLN: 1000.0000 mg | Freq: Once | INTRAVENOUS | Status: DC | PRN

## 2021-05-20 MED ORDER — BISACODYL 10 MG RE SUPP: 10.0000 mg | Freq: Every day | RECTAL | Status: DC | PRN

## 2021-05-20 MED ORDER — SODIUM CHLORIDE 0.9 % IV SOLN: INTRAVENOUS | Status: DC

## 2021-05-20 MED ORDER — ONDANSETRON HCL 4 MG/2ML IJ SOLN
INTRAMUSCULAR | Status: DC | PRN
  Administered 2021-05-20: 4 mg via INTRAVENOUS

## 2021-05-20 MED ORDER — DEXAMETHASONE SODIUM PHOSPHATE 10 MG/ML IJ SOLN
INTRAMUSCULAR | Status: DC | PRN
  Administered 2021-05-20: 5 mg via INTRAVENOUS

## 2021-05-20 MED ORDER — FENTANYL CITRATE (PF) 100 MCG/2ML IJ SOLN
INTRAMUSCULAR | Status: DC | PRN
  Administered 2021-05-20 (×2): 50 ug via INTRAVENOUS

## 2021-05-20 MED ORDER — METOCLOPRAMIDE HCL 5 MG PO TABS
5.0000 mg | ORAL_TABLET | Freq: Three times a day (TID) | ORAL | Status: DC | PRN
Start: 2021-05-20 — End: 2021-05-21

## 2021-05-20 MED ORDER — CHLORHEXIDINE GLUCONATE 0.12 % MT SOLN
OROMUCOSAL | Status: AC
  Administered 2021-05-20: 15 mL via OROMUCOSAL
  Filled 2021-05-20: qty 15

## 2021-05-20 MED ORDER — DOCUSATE SODIUM 100 MG PO CAPS
100.0000 mg | ORAL_CAPSULE | Freq: Two times a day (BID) | ORAL | Status: DC
  Administered 2021-05-20 – 2021-05-21 (×2): 100 mg via ORAL
  Filled 2021-05-20 (×2): qty 1

## 2021-05-20 MED ORDER — ACETAMINOPHEN 500 MG PO TABS: 1000.0000 mg | ORAL_TABLET | Freq: Once | ORAL | Status: DC | PRN

## 2021-05-20 MED ORDER — MUPIROCIN 2 % EX OINT
1.0000 "application " | TOPICAL_OINTMENT | Freq: Two times a day (BID) | CUTANEOUS | Status: DC
  Administered 2021-05-21 (×2): 1 via NASAL
  Filled 2021-05-20 (×2): qty 22

## 2021-05-20 MED ORDER — ONDANSETRON HCL 4 MG PO TABS: 4.0000 mg | ORAL_TABLET | Freq: Four times a day (QID) | ORAL | Status: DC | PRN

## 2021-05-20 MED ORDER — DEXAMETHASONE SODIUM PHOSPHATE 10 MG/ML IJ SOLN
INTRAMUSCULAR | Status: AC
  Filled 2021-05-20: qty 1

## 2021-05-20 MED ORDER — METHOCARBAMOL 1000 MG/10ML IJ SOLN
500.0000 mg | Freq: Four times a day (QID) | INTRAVENOUS | Status: DC | PRN
  Filled 2021-05-20: qty 5

## 2021-05-20 MED ORDER — CEFAZOLIN SODIUM-DEXTROSE 1-4 GM/50ML-% IV SOLN
1.0000 g | Freq: Three times a day (TID) | INTRAVENOUS | Status: AC
  Administered 2021-05-20 – 2021-05-21 (×2): 1 g via INTRAVENOUS
  Filled 2021-05-20 (×2): qty 50

## 2021-05-20 MED ORDER — ONDANSETRON HCL 4 MG/2ML IJ SOLN: 4.0000 mg | Freq: Four times a day (QID) | INTRAMUSCULAR | Status: DC | PRN

## 2021-05-20 MED ORDER — 0.9 % SODIUM CHLORIDE (POUR BTL) OPTIME
TOPICAL | Status: DC | PRN
  Administered 2021-05-20 (×2): 1000 mL

## 2021-05-20 MED ORDER — CHLORHEXIDINE GLUCONATE CLOTH 2 % EX PADS
6.0000 | MEDICATED_PAD | Freq: Every day | CUTANEOUS | Status: DC
  Administered 2021-05-21: 6 via TOPICAL

## 2021-05-20 MED ORDER — FENTANYL CITRATE (PF) 100 MCG/2ML IJ SOLN
INTRAMUSCULAR | Status: AC
  Filled 2021-05-20: qty 2

## 2021-05-20 MED ORDER — PROPOFOL 10 MG/ML IV BOLUS
INTRAVENOUS | Status: DC | PRN
  Administered 2021-05-20: 30 mg via INTRAVENOUS
  Administered 2021-05-20: 80 mg via INTRAVENOUS

## 2021-05-20 MED ORDER — FENTANYL CITRATE (PF) 100 MCG/2ML IJ SOLN
25.0000 ug | INTRAMUSCULAR | Status: DC | PRN
  Administered 2021-05-20 (×2): 25 ug via INTRAVENOUS

## 2021-05-20 MED ORDER — MORPHINE SULFATE (PF) 2 MG/ML IV SOLN
1.0000 mg | INTRAVENOUS | Status: DC | PRN
  Administered 2021-05-20 – 2021-05-21 (×4): 1 mg via INTRAVENOUS
  Filled 2021-05-20 (×5): qty 1

## 2021-05-20 MED ORDER — POTASSIUM CHLORIDE CRYS ER 20 MEQ PO TBCR
40.0000 meq | EXTENDED_RELEASE_TABLET | Freq: Every day | ORAL | Status: DC
  Administered 2021-05-20 – 2021-05-21 (×2): 40 meq via ORAL
  Filled 2021-05-20 (×2): qty 2

## 2021-05-20 MED ORDER — CHLORHEXIDINE GLUCONATE 4 % EX LIQD
60.0000 mL | Freq: Once | CUTANEOUS | Status: AC
  Administered 2021-05-20: 4 via TOPICAL
  Filled 2021-05-20: qty 60

## 2021-05-20 MED ORDER — ONDANSETRON HCL 4 MG/2ML IJ SOLN
INTRAMUSCULAR | Status: AC
  Filled 2021-05-20: qty 2

## 2021-05-20 MED ORDER — CHLORHEXIDINE GLUCONATE 0.12 % MT SOLN: 15.0000 mL | Freq: Once | OROMUCOSAL | Status: AC

## 2021-05-20 MED ORDER — METHOCARBAMOL 500 MG PO TABS
500.0000 mg | ORAL_TABLET | Freq: Four times a day (QID) | ORAL | Status: DC | PRN
  Administered 2021-05-20 – 2021-05-21 (×2): 500 mg via ORAL
  Filled 2021-05-20 (×2): qty 1

## 2021-05-20 MED ORDER — CEFAZOLIN SODIUM-DEXTROSE 2-4 GM/100ML-% IV SOLN
2.0000 g | INTRAVENOUS | Status: AC
  Administered 2021-05-20: 2 g via INTRAVENOUS
  Filled 2021-05-20: qty 100

## 2021-05-20 MED ORDER — OXYCODONE HCL 5 MG/5ML PO SOLN
5.0000 mg | Freq: Once | ORAL | Status: DC | PRN
Start: 2021-05-20 — End: 2021-05-20

## 2021-05-20 MED ORDER — ORAL CARE MOUTH RINSE: 15.0000 mL | Freq: Once | OROMUCOSAL | Status: AC

## 2021-05-20 MED ORDER — METOCLOPRAMIDE HCL 5 MG/ML IJ SOLN: 5.0000 mg | Freq: Three times a day (TID) | INTRAMUSCULAR | Status: DC | PRN

## 2021-05-20 MED ORDER — FENTANYL CITRATE (PF) 250 MCG/5ML IJ SOLN
INTRAMUSCULAR | Status: AC
  Filled 2021-05-20: qty 5

## 2021-05-20 MED ORDER — OXYCODONE HCL 5 MG PO TABS: 5.0000 mg | ORAL_TABLET | Freq: Once | ORAL | Status: DC | PRN

## 2021-05-20 SURGICAL SUPPLY — 43 items
BAG COUNTER SPONGE SURGICOUNT (BAG) IMPLANT
BAG SPNG CNTER NS LX DISP (BAG)
BLADE SURG 21 STRL SS (BLADE) ×2 IMPLANT
BNDG COHESIVE 6X5 TAN NS LF (GAUZE/BANDAGES/DRESSINGS) ×1 IMPLANT
BNDG COHESIVE 6X5 TAN STRL LF (GAUZE/BANDAGES/DRESSINGS) IMPLANT
BNDG GAUZE ELAST 4 BULKY (GAUZE/BANDAGES/DRESSINGS) ×4 IMPLANT
COVER SURGICAL LIGHT HANDLE (MISCELLANEOUS) ×4 IMPLANT
DRAPE DERMATAC (DRAPES) ×2 IMPLANT
DRAPE U-SHAPE 47X51 STRL (DRAPES) ×2 IMPLANT
DRESSING VERAFLO CLEANS CC MED (GAUZE/BANDAGES/DRESSINGS) IMPLANT
DRSG ADAPTIC 3X8 NADH LF (GAUZE/BANDAGES/DRESSINGS) ×2 IMPLANT
DRSG MEPILEX BORDER 4X12 (GAUZE/BANDAGES/DRESSINGS) ×1 IMPLANT
DRSG VERAFLO CLEANSE CC MED (GAUZE/BANDAGES/DRESSINGS) ×2
DURAPREP 26ML APPLICATOR (WOUND CARE) ×2 IMPLANT
ELECT REM PT RETURN 9FT ADLT (ELECTROSURGICAL)
ELECTRODE REM PT RTRN 9FT ADLT (ELECTROSURGICAL) IMPLANT
GAUZE SPONGE 4X4 12PLY STRL (GAUZE/BANDAGES/DRESSINGS) ×2 IMPLANT
GLOVE SURG ORTHO LTX SZ9 (GLOVE) ×2 IMPLANT
GLOVE SURG UNDER POLY LF SZ9 (GLOVE) ×2 IMPLANT
GOWN STRL REUS W/ TWL XL LVL3 (GOWN DISPOSABLE) ×2 IMPLANT
GOWN STRL REUS W/TWL XL LVL3 (GOWN DISPOSABLE) ×4
GRAFT MYRIAD 3 LAYER 10X10 (Graft) ×1 IMPLANT
HANDPIECE INTERPULSE COAX TIP (DISPOSABLE)
KIT BASIN OR (CUSTOM PROCEDURE TRAY) ×2 IMPLANT
KIT DRSG PREVENA PLUS 7DAY 125 (MISCELLANEOUS) ×1 IMPLANT
KIT TURNOVER KIT B (KITS) ×2 IMPLANT
MANIFOLD NEPTUNE II (INSTRUMENTS) ×2 IMPLANT
NS IRRIG 1000ML POUR BTL (IV SOLUTION) ×2 IMPLANT
PACK ORTHO EXTREMITY (CUSTOM PROCEDURE TRAY) ×2 IMPLANT
PAD ARMBOARD 7.5X6 YLW CONV (MISCELLANEOUS) ×4 IMPLANT
PAD NEG PRESSURE SENSATRAC (MISCELLANEOUS) ×1 IMPLANT
POWDER MYRIAD MORCELLS 1000MG (Miscellaneous) ×1 IMPLANT
SET HNDPC FAN SPRY TIP SCT (DISPOSABLE) IMPLANT
STAPLER VISISTAT 35W (STAPLE) ×1 IMPLANT
STOCKINETTE IMPERVIOUS 9X36 MD (GAUZE/BANDAGES/DRESSINGS) IMPLANT
SUT ETHILON 2 0 PSLX (SUTURE) ×2 IMPLANT
SUT SILK 2 0 (SUTURE) ×2
SUT SILK 2-0 18XBRD TIE 12 (SUTURE) IMPLANT
SWAB COLLECTION DEVICE MRSA (MISCELLANEOUS) ×2 IMPLANT
SWAB CULTURE ESWAB REG 1ML (MISCELLANEOUS) IMPLANT
TOWEL GREEN STERILE (TOWEL DISPOSABLE) ×2 IMPLANT
TUBE CONNECTING 12X1/4 (SUCTIONS) ×2 IMPLANT
YANKAUER SUCT BULB TIP NO VENT (SUCTIONS) ×2 IMPLANT
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
## 2021-05-20 NOTE — Progress Notes (Signed)
Inpatient Diabetes Program Recommendations  AACE/ADA: New Consensus Statement on Inpatient Glycemic Control (2015)  Target Ranges:  Prepandial:   less than 140 mg/dL      Peak postprandial:   less than 180 mg/dL (1-2 hours)      Critically ill patients:  140 - 180 mg/dL   Lab Results  Component Value Date   GLUCAP 170 (H) 05/20/2021   HGBA1C 7.6 (H) 05/19/2021    Review of Glycemic Control Results for PSALM, SCHAPPELL (MRN [id number]) as of 05/20/2021 11:26  Ref. Range 05/19/2021 08:02 05/19/2021 12:12 05/19/2021 16:27 05/19/2021 20:33 05/20/2021 09:01  Glucose-Capillary Latest Ref Range: 70 - 99 mg/dL 189 (H) 169 (H) 257 (H) 200 (H) 170 (H)   Diabetes history: DM 2 Outpatient Diabetes medications:  Lantus 10 units daily, Prednisone 5 mg daily Current orders for Inpatient glycemic control:  Novolog sensitive tid with meals and HS Semglee 10 units daily Inpatient Diabetes Program Recommendations:    May consider increasing Semglee to 12 units daily.   Thanks,  Adah Perl, RN, BC-ADM Inpatient Diabetes Coordinator Pager [phone]  (8a-5p)

## 2021-05-20 NOTE — Op Note (Signed)
05/20/2021  4:38 PM  PATIENT:  Robert Dickerson    PRE-OPERATIVE DIAGNOSIS:  right leg wound  POST-OPERATIVE DIAGNOSIS:  Same  PROCEDURE:  IRRIGATION AND DEBRIDEMENT OF LEG AND APPLICATION OF SKIN GRAFT  SURGEON:  Newt Minion, MD  PHYSICIAN ASSISTANT:None ANESTHESIA:   General  PREOPERATIVE INDICATIONS:  Robert Dickerson is a  80 y.o. male with a diagnosis of right leg wound who failed conservative measures and elected for surgical management.    The risks benefits and alternatives were discussed with the patient preoperatively including but not limited to the risks of infection, bleeding, nerve injury, cardiopulmonary complications, the need for revision surgery, among others, and the patient was willing to proceed.  OPERATIVE IMPLANTS: Myriad skin graft 10 x 10 sheet and 1 g of powder  '@ENCIMAGES' @  OPERATIVE FINDINGS: Good healthy granulation tissue in the wound bed.  There was exposed bone this was covered with powder and to radiographic of the skin graft  OPERATIVE PROCEDURE: Patient was brought the operating room underwent a general anesthetic.  After ANESTHESIA obtained patient's right lower extremity was prepped using DuraPrep draped into a sterile field a timeout was called.  A 21 blade knife was used to excise the nonviable ischemic tissue.  This left a wound that was 10 x 10 cm.  There was exposed tendon there is no contamination.  The adipose tissue was resected there was good healthy muscle envelopes.  The wound was irrigated normal saline electrocautery was used hemostasis.  The myriad powder was applied in the wound including over the exposed tibia.  2 extra pieces of the graft were applied over the tibia the large sheet was applied over the 10 x 10 cm wound and secured in place with staples.  This was then covered with the cleanse choice wound VAC sponge covered with derma tack and a Coban compression wrap from the metatarsal heads to the tibial tubercle.  This had a good  suction fit patient was extubated taken the PACU in stable condition.  Debridement type: Excisional Debridement  Side: right  Body Location: leg   Tools used for debridement: scalpel and rongeur  Pre-debridement Wound size (cm):   Length: 5        Width: 5     Depth: 1   Post-debridement Wound size (cm):   Length: 10        Width: 10     Depth: 1   Debridement depth beyond dead/damaged tissue down to healthy viable tissue: yes  Tissue layer involved: skin, subcutaneous tissue  Nature of tissue removed: Non-viable tissue  Irrigation volume: 1 liter     Irrigation fluid type: Normal Saline     DISCHARGE PLANNING:  Antibiotic duration: 24-hour IV antibiotics postoperatively  Weightbearing: Weightbearing as tolerated bilaterally  Pain medication: Patient currently on the opioid pathway  Dressing care/ Wound VAC: Continue wound VAC for 1 week at discharge  Ambulatory devices: Anticipate use of walker  Discharge to: Discharge to home if recommended by therapy.  Follow-up: In the office 1 week post operative.

## 2021-05-20 NOTE — Anesthesia Procedure Notes (Signed)
Procedure Name: LMA Insertion Date/Time: 05/20/2021 3:53 PM Performed by: Gwyndolyn Saxon, CRNA Pre-anesthesia Checklist: Patient identified, Emergency Drugs available, Suction available and Patient being monitored Patient Re-evaluated:Patient Re-evaluated prior to induction Oxygen Delivery Method: Circle system utilized Preoxygenation: Pre-oxygenation with 100% oxygen Induction Type: IV induction Ventilation: Mask ventilation without difficulty LMA: LMA inserted LMA Size: 4.0 Number of attempts: 1 Airway Equipment and Method: Patient positioned with wedge pillow Placement Confirmation: positive ETCO2 and breath sounds checked- equal and bilateral Tube secured with: Tape Dental Injury: Teeth and Oropharynx as per pre-operative assessment

## 2021-05-20 NOTE — Anesthesia Preprocedure Evaluation (Addendum)
Anesthesia Evaluation  Patient identified by MRN, date of birth, ID band Patient awake    Reviewed: Allergy & Precautions, NPO status , Patient's Chart, lab work & pertinent test results  History of Anesthesia Complications Negative for: history of anesthetic complications  Airway Mallampati: III  TM Distance: >3 FB Neck ROM: Full    Dental  (+) Dental Advisory Given   Pulmonary former smoker,    breath sounds clear to auscultation       Cardiovascular hypertension, +CHF   Rhythm:Regular  Left ventricle: The cavity size was normal. Wall thickness  was normal. Systolic function was low normal to mildly  reduced. The estimated ejection fraction was in the range  of 50% to 55%. Wall motion was normal; there were no  regional wall motion abnormalities. Doppler parameters are  consistent with abnormal left ventricular relaxation  (grade 1 diastolic dysfunction).  - Aortic valve: There was no stenosis. Trivial  regurgitation.  - Aorta: Mildly dilated aortic root.  - Mitral valve: Mildly calcified annulus. Mildly calcified  leaflets . Trivial regurgitation.  - Left atrium: The atrium was mildly dilated.  - Right ventricle: The cavity size was normal. Systolic  function was normal.  - Pulmonary arteries: No complete TR doppler jet so unable  to estimate PA systolic pressure.       Neuro/Psych    GI/Hepatic GERD  ,  Endo/Other  diabetes  Renal/GU Renal disease     Musculoskeletal  (+) Arthritis ,   Abdominal   Peds  Hematology  (+) Blood dyscrasia, anemia , Lab Results      Component                Value               Date                      WBC                      11.2 (H)            05/21/2021                HGB                      11.8 (L)            05/21/2021                HCT                      35.5 (L)            05/21/2021                MCV                      88.3                 05/21/2021                PLT                      317                 05/21/2021              Anesthesia Other Findings   Reproductive/Obstetrics  Anesthesia Physical Anesthesia Plan  ASA: 3  Anesthesia Plan: General   Post-op Pain Management:    Induction: Intravenous  PONV Risk Score and Plan: 2 and Ondansetron and Dexamethasone  Airway Management Planned: LMA  Additional Equipment: None  Intra-op Plan:   Post-operative Plan: Extubation in OR  Informed Consent: I have reviewed the patients History and Physical, chart, labs and discussed the procedure including the risks, benefits and alternatives for the proposed anesthesia with the patient or authorized representative who has indicated his/her understanding and acceptance.     Dental advisory given  Plan Discussed with: CRNA and Anesthesiologist  Anesthesia Plan Comments:         Anesthesia Quick Evaluation

## 2021-05-20 NOTE — Transfer of Care (Signed)
Immediate Anesthesia Transfer of Care Note  Patient: Robert Dickerson  Procedure(s) Performed: IRRIGATION AND DEBRIDEMENT OF LEG AND APPLICATION OF SKIN GRAFT (Right)  Patient Location: PACU  Anesthesia Type:General  Level of Consciousness: drowsy and patient cooperative  Airway & Oxygen Therapy: Patient Spontanous Breathing and Patient connected to face mask oxygen  Post-op Assessment: Report given to RN and Post -op Vital signs reviewed and stable  Post vital signs: Reviewed and stable  Last Vitals:  Vitals Value Taken Time  BP    Temp    Pulse 82 05/20/21 1633  Resp 13 05/20/21 1633  SpO2 98 % 05/20/21 1633    Last Pain:  Vitals:   05/20/21 1412  TempSrc:   PainSc: 8          Complications: No notable events documented.

## 2021-05-20 NOTE — Progress Notes (Signed)
Pt refusing cpap for the night. ?

## 2021-05-21 ENCOUNTER — Encounter (HOSPITAL_COMMUNITY): Payer: Self-pay | Admitting: Orthopedic Surgery

## 2021-05-21 ENCOUNTER — Encounter (HOSPITAL_COMMUNITY): Payer: Self-pay

## 2021-05-21 DIAGNOSIS — J9601 Acute respiratory failure with hypoxia: Secondary | ICD-10-CM | POA: Diagnosis not present

## 2021-05-21 LAB — BASIC METABOLIC PANEL
Anion gap: 12 (ref 5–15)
BUN: 56 mg/dL — ABNORMAL HIGH (ref 8–23)
CO2: 28 mmol/L (ref 22–32)
Calcium: 8.3 mg/dL — ABNORMAL LOW (ref 8.9–10.3)
Chloride: 95 mmol/L — ABNORMAL LOW (ref 98–111)
Creatinine, Ser: 2.18 mg/dL — ABNORMAL HIGH (ref 0.61–1.24)
GFR, Estimated: 30 mL/min — ABNORMAL LOW (ref >60)
Glucose, Bld: 236 mg/dL — ABNORMAL HIGH (ref 70–99)
Potassium: 3.9 mmol/L (ref 3.5–5.1)
Sodium: 135 mmol/L (ref 135–145)

## 2021-05-21 LAB — CBC
HCT: 35.5 % — ABNORMAL LOW (ref 39.0–52.0)
Hemoglobin: 11.8 g/dL — ABNORMAL LOW (ref 13.0–17.0)
MCH: 29.4 pg (ref 26.0–34.0)
MCHC: 33.2 g/dL (ref 30.0–36.0)
MCV: 88.3 fL (ref 80.0–100.0)
Platelets: 317 10*3/uL (ref 150–400)
RBC: 4.02 MIL/uL — ABNORMAL LOW (ref 4.22–5.81)
RDW: 15.9 % — ABNORMAL HIGH (ref 11.5–15.5)
WBC: 11.2 10*3/uL — ABNORMAL HIGH (ref 4.0–10.5)
nRBC: 0 % (ref 0.0–0.2)

## 2021-05-21 LAB — GLUCOSE, CAPILLARY
Glucose-Capillary: 214 mg/dL — ABNORMAL HIGH (ref 70–99)
Glucose-Capillary: 237 mg/dL — ABNORMAL HIGH (ref 70–99)

## 2021-05-21 MED ORDER — MUPIROCIN 2 % EX OINT: 1.0000 "application " | TOPICAL_OINTMENT | Freq: Two times a day (BID) | CUTANEOUS | 0 refills | Status: AC

## 2021-05-21 MED ORDER — OXYCODONE HCL 5 MG PO TABS: 5.0000 mg | ORAL_TABLET | ORAL | 0 refills | Status: DC | PRN

## 2021-05-21 MED ORDER — SENNOSIDES-DOCUSATE SODIUM 8.6-50 MG PO TABS: 1.0000 | ORAL_TABLET | Freq: Every evening | ORAL | Status: DC | PRN

## 2021-05-21 NOTE — Anesthesia Postprocedure Evaluation (Signed)
Anesthesia Post Note  Patient: Robert Dickerson  Procedure(s) Performed: IRRIGATION AND DEBRIDEMENT OF LEG AND APPLICATION OF SKIN GRAFT (Right)     Patient location during evaluation: PACU Anesthesia Type: General Level of consciousness: awake and alert Pain management: pain level controlled Vital Signs Assessment: post-procedure vital signs reviewed and stable Respiratory status: spontaneous breathing, nonlabored ventilation, respiratory function stable and patient connected to nasal cannula oxygen Cardiovascular status: blood pressure returned to baseline and stable Postop Assessment: no apparent nausea or vomiting Anesthetic complications: no   No notable events documented.  Last Vitals:  Vitals:   05/21/21 0100 05/21/21 0426  BP:  127/64  Pulse:  86  Resp: 15   Temp:  36.9 C  SpO2:  94%    Last Pain:  Vitals:   05/21/21 1230  TempSrc:   PainSc: 0-No pain                 Florina Glas

## 2021-05-21 NOTE — Progress Notes (Addendum)
Patient ID: Robert Dickerson, male   DOB: 12-19-1940, 80 y.o.   MRN: [id number] Is a 80 year old gentleman who is postoperative day 1 application of skin graft right leg.  Patient also had multiple evulsion abrasions on his arms that are treated with Mepilex dressing.  Patient may discharge to home when he is safe with therapy.  I will follow-up in office in 1 week.  Patient to discharge with the portable Praveena wound VAC pump.  There is 25 cc in the wound VAC canister.
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
## 2021-05-21 NOTE — TOC Transition Note (Addendum)
Transition of Care [facility]) - CM/SW Discharge Note   Patient Details  Name: Robert Dickerson MRN: [id number] Date of Birth: [birth year]  Transition of Care [facility] CM/SW Contact:  Angelita Ingles, RN Phone [phone]  05/21/2021, 1:23 PM   Clinical Narrative:    [facility] consulted for [facility] needs. CM at bedside to offer choice for [facility]. HH has been set up with [facility]. No other needs notes at this time. TOC will sign off         Patient Goals and CMS Choice        Discharge Placement                       Discharge Plan and Services                                     Social Determinants of Health (SDOH) Interventions     Readmission Risk Interventions No flowsheet data found.
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
## 2021-05-22 ENCOUNTER — Ambulatory Visit: Payer: PRIVATE HEALTH INSURANCE

## 2021-05-22 NOTE — Progress Notes (Signed)
Patient is s/p a RLE I&D application of skin graft and wound vac on 05/20/21. Pt's wife walked into the office holding the preveena wound vac and said that she had been advised by the hospital to d/c the device and just follow up in the office on Thursday. I asked for her to bring the pt in the office for a nurse only visit to evaluate the leg. Called Dr. Sharol Given and advised what has happened. Order given to change canister and reconnect device. This was done in the office 6 green light checks remain. No alarm indications. Extra canister given to patient and reviewed the steps to remove current canister when full and attach new one. Encouraged pt to elevate his leg higher than his heart. To call the office with any questions or concerns. Will keep appt scheduled for 05/28/21  Mariana Arn, RMA, [facility]

## 2021-05-25 ENCOUNTER — Telehealth: Payer: Self-pay | Admitting: Orthopedic Surgery

## 2021-05-25 ENCOUNTER — Ambulatory Visit (INDEPENDENT_AMBULATORY_CARE_PROVIDER_SITE_OTHER): Payer: Medicare Other | Admitting: Orthopedic Surgery

## 2021-05-25 ENCOUNTER — Other Ambulatory Visit: Payer: Self-pay

## 2021-05-25 ENCOUNTER — Encounter: Payer: Self-pay | Admitting: Orthopedic Surgery

## 2021-05-25 DIAGNOSIS — Z981 Arthrodesis status: Secondary | ICD-10-CM | POA: Diagnosis not present

## 2021-05-25 DIAGNOSIS — S3992XA Unspecified injury of lower back, initial encounter: Secondary | ICD-10-CM | POA: Diagnosis not present

## 2021-05-25 DIAGNOSIS — S81811D Laceration without foreign body, right lower leg, subsequent encounter: Secondary | ICD-10-CM

## 2021-05-25 NOTE — Telephone Encounter (Signed)
I spoke with pt. He has used up two canisters since Friday, 10/21. He says the canister is not beeping yet, but it does have drainage at the very top. He was scheduled to see Dr. Sharol Given this morning at 10 am.

## 2021-05-25 NOTE — Telephone Encounter (Signed)
Pt called stating that his wound vac is full again and he lives in [place] and cant come to [place]. Please call pt with instructions. Pt phone number is [phone].
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
## 2021-05-26 DIAGNOSIS — K5730 Diverticulosis of large intestine without perforation or abscess without bleeding: Secondary | ICD-10-CM | POA: Diagnosis not present

## 2021-05-26 DIAGNOSIS — N179 Acute kidney failure, unspecified: Secondary | ICD-10-CM | POA: Diagnosis not present

## 2021-05-26 DIAGNOSIS — I5032 Chronic diastolic (congestive) heart failure: Secondary | ICD-10-CM | POA: Diagnosis not present

## 2021-05-26 DIAGNOSIS — J9601 Acute respiratory failure with hypoxia: Secondary | ICD-10-CM | POA: Diagnosis not present

## 2021-05-26 DIAGNOSIS — S81811D Laceration without foreign body, right lower leg, subsequent encounter: Secondary | ICD-10-CM | POA: Diagnosis not present

## 2021-05-26 DIAGNOSIS — Z981 Arthrodesis status: Secondary | ICD-10-CM | POA: Diagnosis not present

## 2021-05-26 DIAGNOSIS — G319 Degenerative disease of nervous system, unspecified: Secondary | ICD-10-CM | POA: Diagnosis not present

## 2021-05-26 DIAGNOSIS — E1159 Type 2 diabetes mellitus with other circulatory complications: Secondary | ICD-10-CM | POA: Diagnosis not present

## 2021-05-26 DIAGNOSIS — E669 Obesity, unspecified: Secondary | ICD-10-CM | POA: Diagnosis not present

## 2021-05-26 DIAGNOSIS — Z7952 Long term (current) use of systemic steroids: Secondary | ICD-10-CM | POA: Diagnosis not present

## 2021-05-26 DIAGNOSIS — M50321 Other cervical disc degeneration at C4-C5 level: Secondary | ICD-10-CM | POA: Diagnosis not present

## 2021-05-26 DIAGNOSIS — M545 Low back pain, unspecified: Secondary | ICD-10-CM | POA: Diagnosis not present

## 2021-05-26 DIAGNOSIS — E1122 Type 2 diabetes mellitus with diabetic chronic kidney disease: Secondary | ICD-10-CM | POA: Diagnosis not present

## 2021-05-26 DIAGNOSIS — Z794 Long term (current) use of insulin: Secondary | ICD-10-CM | POA: Diagnosis not present

## 2021-05-26 DIAGNOSIS — G4733 Obstructive sleep apnea (adult) (pediatric): Secondary | ICD-10-CM | POA: Diagnosis not present

## 2021-05-26 DIAGNOSIS — Z9981 Dependence on supplemental oxygen: Secondary | ICD-10-CM | POA: Diagnosis not present

## 2021-05-26 DIAGNOSIS — N184 Chronic kidney disease, stage 4 (severe): Secondary | ICD-10-CM | POA: Diagnosis not present

## 2021-05-26 DIAGNOSIS — G894 Chronic pain syndrome: Secondary | ICD-10-CM | POA: Diagnosis not present

## 2021-05-26 DIAGNOSIS — I152 Hypertension secondary to endocrine disorders: Secondary | ICD-10-CM | POA: Diagnosis not present

## 2021-05-26 DIAGNOSIS — E876 Hypokalemia: Secondary | ICD-10-CM | POA: Diagnosis not present

## 2021-05-26 DIAGNOSIS — M4802 Spinal stenosis, cervical region: Secondary | ICD-10-CM | POA: Diagnosis not present

## 2021-05-28 ENCOUNTER — Other Ambulatory Visit: Payer: Self-pay

## 2021-05-28 ENCOUNTER — Ambulatory Visit (INDEPENDENT_AMBULATORY_CARE_PROVIDER_SITE_OTHER): Payer: Medicare Other | Admitting: Orthopedic Surgery

## 2021-05-28 DIAGNOSIS — S81811D Laceration without foreign body, right lower leg, subsequent encounter: Secondary | ICD-10-CM | POA: Diagnosis not present

## 2021-05-28 NOTE — Progress Notes (Signed)
Office Visit Note   Patient: Robert Dickerson           Date of Birth: [birth year]           MRN: [id number] Visit Date: 05/28/2021              Requested by: Lavone Orn, MD [address] [place],  [place] [address] PCP: Lavone Orn, MD  Chief Complaint  Patient presents with   Right Leg - Routine Post Op    05/20/21 I&D RLE myraid graft       HPI: Patient is a 80 year old gentleman status post myriad skin graft to his right leg.  Patient also had skin avulsion from the right forearm.  Patient states the compression sleeve is peeling off the skin.  Assessment & Plan: Visit Diagnoses: No diagnosis found.  Plan: We will apply compression wrap to the right lower extremity and right upper extremity.  Follow-Up Instructions: No follow-ups on file.   Ortho Exam  Patient is alert, oriented, no adenopathy, well-dressed, normal affect, normal respiratory effort. Examination patient is short of breath seated.  He has excellent granulation tissue in the right forearm the granulation tissue is flat healthy viable.  There is also some ischemic changes to the [facility] skin graft on the right calf there is some ecchymosis laterally.  We will apply compression with a moist dressing.  Imaging: No results found.     Labs: Lab Results  Component Value Date   HGBA1C 7.6 (H) 05/19/2021   HGBA1C 9.5 (H) 12/23/2020   LABURIC 6.4 03/04/2011   REPTSTATUS 12/27/2020 FINAL 12/22/2020   CULT  12/22/2020    NO GROWTH 5 DAYS Performed at [facility], [place] [address], [place], [place] [address]      Lab Results  Component Value Date   ALBUMIN 3.2 (L) 05/18/2021   ALBUMIN 3.5 (L) 12/15/2015   ALBUMIN 3.4 (L) 10/28/2015    Lab Results  Component Value Date   MG 2.2 05/19/2021   MG 2.0 12/23/2020   MG 2.0 12/23/2020   No results found for: VD25OH  No results found for: PREALBUMIN CBC EXTENDED Latest Ref Rng & Units 05/21/2021 05/18/2021 05/18/2021  WBC 4.0 -  10.5 K/uL 11.2(H) - 10.8(H)  RBC 4.22 - 5.81 MIL/uL 4.02(L) - 4.92  HGB 13.0 - 17.0 g/dL 11.8(L) 14.6 14.4  HCT 39.0 - 52.0 % 35.5(L) 43.0 42.7  PLT 150 - 400 K/uL 317 - 390  NEUTROABS 1.7 - 7.7 K/uL - - -  LYMPHSABS 0.7 - 4.0 K/uL - - -     There is no height or weight on file to calculate BMI.  Orders:  No orders of the defined types were placed in this encounter.  No orders of the defined types were placed in this encounter.    Procedures: No procedures performed  Clinical Data: No additional findings.  ROS:  All other systems negative, except as noted in the HPI. Review of Systems  Objective: Vital Signs: There were no vitals taken for this visit.  Specialty Comments:  No specialty comments available.  PMFS History: Patient Active Problem List   Diagnosis Date Noted   Laceration of leg 05/20/2021   Acute respiratory failure with hypoxia (HCC) 05/18/2021   Chronic diastolic CHF (congestive heart failure) ([place]) 05/18/2021   Chronic kidney disease (CKD), stage IV (severe) ([place]) 05/18/2021   Hypertension associated with diabetes ([place]) 05/18/2021   Insulin dependent type 2 diabetes mellitus ([place]) 05/18/2021  OSA on CPAP 05/18/2021   Laceration of right lower extremity 05/18/2021   Acute kidney injury superimposed on CKD ([place]) 05/18/2021   Current chronic use of systemic steroids 01/08/2021   Preoperative cardiovascular examination 01/08/2021   Cellulitis 12/23/2020   Sepsis ([place]) 12/23/2020   Acute kidney injury superimposed on chronic kidney disease ([place]) 12/23/2020   Hypokalemia 12/23/2020   Benign neoplasm of duodenum, jejunum, 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   Chronic kidney disease, stage 4 (severe) ([place]) 12/03/2020   Degeneration of lumbar intervertebral disc 12/03/2020   Type 2 diabetes mellitus, uncontrolled, with neuropathy 12/03/2020   Diabetic peripheral neuropathy associated with type 2 diabetes mellitus ([place]) 48/08/6551    Diastolic heart failure ([place]) 12/03/2020   Enlarged prostate 12/03/2020   Erectile dysfunction due to arterial insufficiency 12/03/2020   Essential hypertension 12/03/2020   Gastro-esophageal reflux disease without esophagitis 12/03/2020   Hematuria 12/03/2020   Impaired fasting glucose 12/03/2020   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   Morbid obesity ([place]) 12/03/2020   Obstructive sleep apnea 12/03/2020   Osteoarthritis of hip 12/03/2020   Osteoarthritis of knee 12/03/2020   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/2020   Restless legs 12/03/2020   Skin sensation disturbance 12/03/2020   Body mass index (BMI) 29.0-29.9, adult 11/21/2020   Lumbar stenosis 11/21/2020   Tendinitis 09/03/2020   Subacute arthropathy 09/03/2020   Bilateral hearing loss 08/11/2020   Impacted cerumen of left ear 03/10/2017   Excessive cerumen in left ear canal 03/10/2017   Faintness 12/09/2015   Backache 11/14/2014   CHRONIC PANCREATITIS 06/16/2009   Past Medical History:  Diagnosis Date   Arthritis    all joints   Chronic diastolic CHF (congestive heart failure) (HCC)    Chronic kidney disease (CKD), stage IV (severe) (HCC)    Chronic kidney disease, stage 3 (HCC)    Congestive heart failure with LV diastolic dysfunction, NYHA class 2 (HCC)    GERD (gastroesophageal reflux disease)    Hypertension    Hypertension associated with diabetes ([place])    Hypertensive chronic kidney disease    Insulin dependent type 2 diabetes mellitus (HCC)    OSA on CPAP    Sleep apnea    uses cpap, pt does not know settings    Family History  Problem Relation Age of Onset   Hypertension Mother    Anesthesia problems Neg Hx    Hypotension Neg Hx    Malignant hyperthermia Neg Hx    Pseudochol deficiency Neg Hx     Past Surgical History:  Procedure Laterality Date   ANTERIOR LAT LUMBAR FUSION Left 01/22/2021   Procedure: Lumbar one-Lumbar two Lateral Lumbar Interbody Fusion;  Surgeon: Vallarie Mare, MD;  Location: [facility];  Service: Neurosurgery;  Laterality: Left;   ANTERIOR LAT LUMBAR FUSION  01/22/2021   APPENDECTOMY     BACK SURGERY     4 surg, lower   CHOLECYSTECTOMY     COLONOSCOPY WITH PROPOFOL N/A 06/11/2014   Procedure: COLONOSCOPY WITH PROPOFOL;  Surgeon: Garlan Fair, MD;  [facility];  Service: Endoscopy;  Laterality: N/A;   ESOPHAGOGASTRODUODENOSCOPY  08/26/2011   Procedure: ESOPHAGOGASTRODUODENOSCOPY (EGD);  Surgeon: Garlan Fair, MD;  [facility];  Service: Endoscopy;  Laterality: N/A;   ESOPHAGOGASTRODUODENOSCOPY (EGD) WITH PROPOFOL N/A 06/11/2014   Procedure: ESOPHAGOGASTRODUODENOSCOPY (EGD) WITH PROPOFOL;  Surgeon: Garlan Fair, MD;  [facility];  Service: Endoscopy;  Laterality: N/A;   EUS  09/15/2011   Procedure: UPPER  ENDOSCOPIC ULTRASOUND (EUS) LINEAR;  Surgeon: Landry Dyke, MD;  [facility];  Service: Endoscopy;  Laterality: N/A;  MAC   I & D EXTREMITY Right 05/20/2021   Procedure: IRRIGATION AND DEBRIDEMENT OF LEG AND APPLICATION OF SKIN GRAFT;  Surgeon: Newt Minion, MD;  Location: [facility];  Service: Orthopedics;  Laterality: Right;   KNEE ARTHROSCOPY Right YEARS AGO   Social History   Occupational History   Not on file  Tobacco Use   Smoking status: Former    Packs/day: 2.00    Years: 20.00    Pack years: 40.00    Types: Cigarettes    Quit date: 67    Years since quitting: 38.8   Smokeless tobacco: Never  Substance and Sexual Activity   Alcohol use: Not Currently   Drug use: Never   Sexual activity: Not on file

## 2021-05-29 ENCOUNTER — Telehealth: Payer: Self-pay | Admitting: Orthopedic Surgery

## 2021-05-29 ENCOUNTER — Other Ambulatory Visit: Payer: Self-pay | Admitting: Orthopedic Surgery

## 2021-05-29 DIAGNOSIS — N1832 Chronic kidney disease, stage 3b: Secondary | ICD-10-CM | POA: Diagnosis not present

## 2021-05-29 DIAGNOSIS — K219 Gastro-esophageal reflux disease without esophagitis: Secondary | ICD-10-CM | POA: Diagnosis not present

## 2021-05-29 DIAGNOSIS — I5032 Chronic diastolic (congestive) heart failure: Secondary | ICD-10-CM | POA: Diagnosis not present

## 2021-05-29 DIAGNOSIS — I129 Hypertensive chronic kidney disease with stage 1 through stage 4 chronic kidney disease, or unspecified chronic kidney disease: Secondary | ICD-10-CM | POA: Diagnosis not present

## 2021-05-29 DIAGNOSIS — E1140 Type 2 diabetes mellitus with diabetic neuropathy, unspecified: Secondary | ICD-10-CM | POA: Diagnosis not present

## 2021-05-29 DIAGNOSIS — S81811D Laceration without foreign body, right lower leg, subsequent encounter: Secondary | ICD-10-CM | POA: Diagnosis not present

## 2021-05-29 DIAGNOSIS — E1122 Type 2 diabetes mellitus with diabetic chronic kidney disease: Secondary | ICD-10-CM | POA: Diagnosis not present

## 2021-05-29 DIAGNOSIS — M545 Low back pain, unspecified: Secondary | ICD-10-CM | POA: Diagnosis not present

## 2021-05-29 DIAGNOSIS — I10 Essential (primary) hypertension: Secondary | ICD-10-CM | POA: Diagnosis not present

## 2021-05-29 DIAGNOSIS — N184 Chronic kidney disease, stage 4 (severe): Secondary | ICD-10-CM | POA: Diagnosis not present

## 2021-05-29 DIAGNOSIS — M179 Osteoarthritis of knee, unspecified: Secondary | ICD-10-CM | POA: Diagnosis not present

## 2021-05-29 DIAGNOSIS — G894 Chronic pain syndrome: Secondary | ICD-10-CM | POA: Diagnosis not present

## 2021-05-29 DIAGNOSIS — N400 Enlarged prostate without lower urinary tract symptoms: Secondary | ICD-10-CM | POA: Diagnosis not present

## 2021-05-29 MED ORDER — OXYCODONE HCL 5 MG PO TABS: 5.0000 mg | ORAL_TABLET | ORAL | 0 refills | Status: DC | PRN

## 2021-05-29 NOTE — Telephone Encounter (Signed)
Pt calling asking to speak with nurse. Pt stated he was here for an appt yesterday and had a lot of pain last night. Pt wanted to check if it was normal or if he can do something else to help him sleep at night. The best call back number is [phone].

## 2021-05-29 NOTE — Telephone Encounter (Signed)
Pt in office yesterday s/p myriad graft to the RLE applied Pofore dressing. Pt c/o horrible pain last night and was not able to sleep. Encouraged elevation higher than heart and he requested refill on pain medication. Uses the [facility] last rx was oxycodone 05/21/21 # 20

## 2021-05-29 NOTE — Telephone Encounter (Signed)
Called pt to advise of rx and appt made for 06/01/21 at 2:30

## 2021-05-30 DIAGNOSIS — N184 Chronic kidney disease, stage 4 (severe): Secondary | ICD-10-CM | POA: Diagnosis not present

## 2021-05-30 DIAGNOSIS — G894 Chronic pain syndrome: Secondary | ICD-10-CM | POA: Diagnosis not present

## 2021-05-30 DIAGNOSIS — E1122 Type 2 diabetes mellitus with diabetic chronic kidney disease: Secondary | ICD-10-CM | POA: Diagnosis not present

## 2021-05-30 DIAGNOSIS — M545 Low back pain, unspecified: Secondary | ICD-10-CM | POA: Diagnosis not present

## 2021-05-30 DIAGNOSIS — S81811D Laceration without foreign body, right lower leg, subsequent encounter: Secondary | ICD-10-CM | POA: Diagnosis not present

## 2021-05-30 DIAGNOSIS — I5032 Chronic diastolic (congestive) heart failure: Secondary | ICD-10-CM | POA: Diagnosis not present

## 2021-05-31 ENCOUNTER — Encounter: Payer: Self-pay | Admitting: Orthopedic Surgery

## 2021-06-01 ENCOUNTER — Other Ambulatory Visit: Payer: Self-pay

## 2021-06-01 ENCOUNTER — Encounter: Payer: Self-pay | Admitting: Orthopedic Surgery

## 2021-06-01 ENCOUNTER — Ambulatory Visit (INDEPENDENT_AMBULATORY_CARE_PROVIDER_SITE_OTHER): Payer: Medicare Other | Admitting: Orthopedic Surgery

## 2021-06-01 DIAGNOSIS — S81811D Laceration without foreign body, right lower leg, subsequent encounter: Secondary | ICD-10-CM

## 2021-06-01 MED ORDER — SILVER SULFADIAZINE 1 % EX CREA: 1.0000 "application " | TOPICAL_CREAM | Freq: Every day | CUTANEOUS | 3 refills | Status: DC

## 2021-06-01 NOTE — Progress Notes (Signed)
Office Visit Note   Patient: Robert Dickerson           Date of Birth: [birth year]           MRN: [id number] Visit Date: 06/01/2021              Requested by: Lavone Orn, MD [address] [place],  [place] [address] PCP: Lavone Orn, MD  Chief Complaint  Patient presents with   Right Leg - Routine Post Op    10/19/22I&D RLE myraid skin graft       HPI: Patient is a 80 year old gentleman status post [employer] skin graft to the right leg for a large hematoma from a fall.  Patient also sustained a shear injury to the right arm and he has been doing dressing changes to this as well.  Patient was placed in a 3 layer compression wrap and due to swelling patient was unable to elevate enough to decrease swelling and presents at this time with pain.  Assessment & Plan: Visit Diagnoses:  1. Laceration of right lower leg, subsequent encounter     Plan: Will start Silvadene dressing changes the [place] area graft has desiccated.  We will request orders for home health nursing to perform Silvadene dressing changes 3 times a week patient will start with Dial soap cleansing Silvadene to the 4 x 4 gauze applied to the arm and leg wrapped with an Ace wrap the importance of elevation was discussed.  Follow-Up Instructions: Return in about 2 weeks (around 06/15/2021).   Ortho Exam  Patient is alert, oriented, no adenopathy, well-dressed, normal affect, normal respiratory effort. Examination there is desiccation of the skin graft.  The arm has excellent healthy granulation tissue.  There is increased swelling in the right leg there is no cellulitis no signs of infection.  We will start Silvadene dressing changes.  Imaging: No results found.    Labs: Lab Results  Component Value Date   HGBA1C 7.6 (H) 05/19/2021   HGBA1C 9.5 (H) 12/23/2020   LABURIC 6.4 03/04/2011   REPTSTATUS 12/27/2020 FINAL 12/22/2020   CULT  12/22/2020    NO GROWTH 5 DAYS Performed at [facility] [place] [address], [place],  [address]      Lab Results  Component Value Date   ALBUMIN 3.2 (L) 05/18/2021   ALBUMIN 3.5 (L) 12/15/2015   ALBUMIN 3.4 (L) 10/28/2015    Lab Results  Component Value Date   MG 2.2 05/19/2021   MG 2.0 12/23/2020   MG 2.0 12/23/2020   No results found for: VD25OH  No results found for: PREALBUMIN CBC EXTENDED Latest Ref Rng & Units 05/21/2021 05/18/2021 05/18/2021  WBC 4.0 - 10.5 K/uL 11.2(H) - 10.8(H)  RBC 4.22 - 5.81 MIL/uL 4.02(L) - 4.92  HGB 13.0 - 17.0 g/dL 11.8(L) 14.6 14.4  HCT 39.0 - 52.0 % 35.5(L) 43.0 42.7  PLT 150 - 400 K/uL 317 - 390  NEUTROABS 1.7 - 7.7 K/uL - - -  LYMPHSABS 0.7 - 4.0 K/uL - - -     There is no height or weight on file to calculate BMI.  Orders:  No orders of the defined types were placed in this encounter.  No orders of the defined types were placed in this encounter.    Procedures: No procedures performed  Clinical Data: No additional findings.  ROS:  All other systems negative, except as noted in the HPI. Review of Systems  Objective: Vital Signs: There were  no vitals taken for this visit.  Specialty Comments:  No specialty comments available.  PMFS History: Patient Active Problem List   Diagnosis Date Noted   Laceration of leg 05/20/2021   Acute respiratory failure with hypoxia (HCC) 05/18/2021   Chronic diastolic CHF (congestive heart failure) ([place]) 05/18/2021   Chronic kidney disease (CKD), stage IV (severe) ([place]) 05/18/2021   Hypertension associated with diabetes ([place]) 05/18/2021   Insulin dependent type 2 diabetes mellitus ([place]) 05/18/2021   OSA on CPAP 05/18/2021   Laceration of right lower extremity 05/18/2021   Acute kidney injury superimposed on CKD ([place]) 05/18/2021   Current chronic use of systemic steroids 01/08/2021   Preoperative cardiovascular examination 01/08/2021   Cellulitis 12/23/2020   Sepsis ([place]) 12/23/2020   Acute kidney injury superimposed on chronic  kidney disease ([place]) 12/23/2020   Hypokalemia 12/23/2020   Benign neoplasm of duodenum, jejunum, 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   Chronic kidney disease, stage 4 (severe) ([place]) 12/03/2020   Degeneration of lumbar intervertebral disc 12/03/2020   Type 2 diabetes mellitus, uncontrolled, with neuropathy 12/03/2020   Diabetic peripheral neuropathy associated with type 2 diabetes mellitus ([place]) 28/63/8177   Diastolic heart failure ([place]) 12/03/2020   Enlarged prostate 12/03/2020   Erectile dysfunction due to arterial insufficiency 12/03/2020   Essential hypertension 12/03/2020   Gastro-esophageal reflux disease without esophagitis 12/03/2020   Hematuria 12/03/2020   Impaired fasting glucose 12/03/2020   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   Morbid obesity ([place]) 12/03/2020   Obstructive sleep apnea 12/03/2020   Osteoarthritis of hip 12/03/2020   Osteoarthritis of knee 12/03/2020   Polymyalgia rheumatica ([place]) 12/03/2020   Restless legs 12/03/2020   Skin sensation disturbance 12/03/2020   Body mass index (BMI) 29.0-29.9, adult 11/21/2020   Lumbar stenosis 11/21/2020   Tendinitis 09/03/2020   Subacute arthropathy 09/03/2020   Bilateral hearing loss 08/11/2020   Impacted cerumen of left ear 03/10/2017   Excessive cerumen in left ear canal 03/10/2017   Faintness 12/09/2015   Backache 11/14/2014   CHRONIC PANCREATITIS 06/16/2009   Past Medical History:  Diagnosis Date   Arthritis    all joints   Chronic diastolic CHF (congestive heart failure) (HCC)    Chronic kidney disease (CKD), stage IV (severe) (HCC)    Chronic kidney disease, stage 3 (HCC)    Congestive heart failure with LV diastolic dysfunction, NYHA class 2 (HCC)    GERD (gastroesophageal reflux disease)    Hypertension    Hypertension associated with diabetes ([place])    Hypertensive chronic kidney disease    Insulin dependent type 2 diabetes mellitus (HCC)    OSA on CPAP     Sleep apnea    uses cpap, pt does not know settings    Family History  Problem Relation Age of Onset   Hypertension Mother    Anesthesia problems Neg Hx    Hypotension Neg Hx    Malignant hyperthermia Neg Hx    Pseudochol deficiency Neg Hx     Past Surgical History:  Procedure Laterality Date   ANTERIOR LAT LUMBAR FUSION Left 01/22/2021   Procedure: Lumbar one-Lumbar two Lateral Lumbar Interbody Fusion;  Surgeon: Vallarie Mare, MD;  Location: [facility];  Service: Neurosurgery;  Laterality: Left;   ANTERIOR LAT LUMBAR FUSION  01/22/2021   APPENDECTOMY     BACK SURGERY     4 surg, lower   CHOLECYSTECTOMY     COLONOSCOPY WITH PROPOFOL N/A 06/11/2014   Procedure: COLONOSCOPY WITH PROPOFOL;  Surgeon: Garlan Fair, MD;  Location: [facility];  Service: Endoscopy;  Laterality: N/A;   ESOPHAGOGASTRODUODENOSCOPY  08/26/2011   Procedure: ESOPHAGOGASTRODUODENOSCOPY (EGD);  Surgeon: Garlan Fair, MD;  Location: [facility];  Service: Endoscopy;  Laterality: N/A;   ESOPHAGOGASTRODUODENOSCOPY (EGD) WITH PROPOFOL N/A 06/11/2014   Procedure: ESOPHAGOGASTRODUODENOSCOPY (EGD) WITH PROPOFOL;  Surgeon: Garlan Fair, MD;  Location: [facility];  Service: Endoscopy;  Laterality: N/A;   EUS  09/15/2011   Procedure: UPPER ENDOSCOPIC ULTRASOUND (EUS) LINEAR;  Surgeon: Landry Dyke, MD;  Location: [facility];  Service: Endoscopy;  Laterality: N/A;  MAC   I & D EXTREMITY Right 05/20/2021   Procedure: IRRIGATION AND DEBRIDEMENT OF LEG AND APPLICATION OF SKIN GRAFT;  Surgeon: Newt Minion, MD;  Location: [facility];  Service: Orthopedics;  Laterality: Right;   KNEE ARTHROSCOPY Right YEARS AGO   Social History   Occupational History   Not on file  Tobacco Use   Smoking status: Former    Packs/day: 2.00    Years: 20.00    Pack years: 40.00    Types: Cigarettes    Quit date: 37    Years since quitting: 38.8   Smokeless tobacco: Never  Substance and Sexual Activity   Alcohol use: Not  Currently   Drug use: Never   Sexual activity: Not on file

## 2021-06-02 DIAGNOSIS — E1122 Type 2 diabetes mellitus with diabetic chronic kidney disease: Secondary | ICD-10-CM | POA: Diagnosis not present

## 2021-06-02 DIAGNOSIS — N184 Chronic kidney disease, stage 4 (severe): Secondary | ICD-10-CM | POA: Diagnosis not present

## 2021-06-02 DIAGNOSIS — M545 Low back pain, unspecified: Secondary | ICD-10-CM | POA: Diagnosis not present

## 2021-06-02 DIAGNOSIS — I5032 Chronic diastolic (congestive) heart failure: Secondary | ICD-10-CM | POA: Diagnosis not present

## 2021-06-02 DIAGNOSIS — G894 Chronic pain syndrome: Secondary | ICD-10-CM | POA: Diagnosis not present

## 2021-06-02 DIAGNOSIS — S81811D Laceration without foreign body, right lower leg, subsequent encounter: Secondary | ICD-10-CM | POA: Diagnosis not present

## 2021-06-03 ENCOUNTER — Encounter: Payer: PRIVATE HEALTH INSURANCE | Admitting: Family

## 2021-06-03 DIAGNOSIS — E876 Hypokalemia: Secondary | ICD-10-CM | POA: Diagnosis not present

## 2021-06-03 DIAGNOSIS — N1832 Chronic kidney disease, stage 3b: Secondary | ICD-10-CM | POA: Diagnosis not present

## 2021-06-03 DIAGNOSIS — E871 Hypo-osmolality and hyponatremia: Secondary | ICD-10-CM | POA: Diagnosis not present

## 2021-06-03 DIAGNOSIS — I509 Heart failure, unspecified: Secondary | ICD-10-CM | POA: Diagnosis not present

## 2021-06-03 DIAGNOSIS — E1122 Type 2 diabetes mellitus with diabetic chronic kidney disease: Secondary | ICD-10-CM | POA: Diagnosis not present

## 2021-06-03 DIAGNOSIS — I129 Hypertensive chronic kidney disease with stage 1 through stage 4 chronic kidney disease, or unspecified chronic kidney disease: Secondary | ICD-10-CM | POA: Diagnosis not present

## 2021-06-03 DIAGNOSIS — S81811S Laceration without foreign body, right lower leg, sequela: Secondary | ICD-10-CM | POA: Diagnosis not present

## 2021-06-04 DIAGNOSIS — S81811D Laceration without foreign body, right lower leg, subsequent encounter: Secondary | ICD-10-CM | POA: Diagnosis not present

## 2021-06-04 DIAGNOSIS — G894 Chronic pain syndrome: Secondary | ICD-10-CM | POA: Diagnosis not present

## 2021-06-04 DIAGNOSIS — E1122 Type 2 diabetes mellitus with diabetic chronic kidney disease: Secondary | ICD-10-CM | POA: Diagnosis not present

## 2021-06-04 DIAGNOSIS — I5032 Chronic diastolic (congestive) heart failure: Secondary | ICD-10-CM | POA: Diagnosis not present

## 2021-06-04 DIAGNOSIS — M545 Low back pain, unspecified: Secondary | ICD-10-CM | POA: Diagnosis not present

## 2021-06-04 DIAGNOSIS — N184 Chronic kidney disease, stage 4 (severe): Secondary | ICD-10-CM | POA: Diagnosis not present

## 2021-06-05 ENCOUNTER — Emergency Department (HOSPITAL_COMMUNITY)
Admission: EM | Admit: 2021-06-05 | Discharge: 2021-06-05 | Disposition: A | Discharge status: Home / Self Care | Payer: Medicare Other | Attending: Emergency Medicine | Admitting: Emergency Medicine

## 2021-06-05 ENCOUNTER — Encounter (HOSPITAL_COMMUNITY): Payer: Self-pay | Admitting: Emergency Medicine

## 2021-06-05 DIAGNOSIS — L03115 Cellulitis of right lower limb: Secondary | ICD-10-CM | POA: Insufficient documentation

## 2021-06-05 DIAGNOSIS — I130 Hypertensive heart and chronic kidney disease with heart failure and stage 1 through stage 4 chronic kidney disease, or unspecified chronic kidney disease: Secondary | ICD-10-CM | POA: Insufficient documentation

## 2021-06-05 DIAGNOSIS — Z87891 Personal history of nicotine dependence: Secondary | ICD-10-CM | POA: Diagnosis not present

## 2021-06-05 DIAGNOSIS — E1122 Type 2 diabetes mellitus with diabetic chronic kidney disease: Secondary | ICD-10-CM | POA: Diagnosis not present

## 2021-06-05 DIAGNOSIS — Z794 Long term (current) use of insulin: Secondary | ICD-10-CM | POA: Insufficient documentation

## 2021-06-05 DIAGNOSIS — E1142 Type 2 diabetes mellitus with diabetic polyneuropathy: Secondary | ICD-10-CM | POA: Insufficient documentation

## 2021-06-05 DIAGNOSIS — N184 Chronic kidney disease, stage 4 (severe): Secondary | ICD-10-CM | POA: Insufficient documentation

## 2021-06-05 DIAGNOSIS — M79604 Pain in right leg: Secondary | ICD-10-CM | POA: Diagnosis present

## 2021-06-05 DIAGNOSIS — I5032 Chronic diastolic (congestive) heart failure: Secondary | ICD-10-CM | POA: Diagnosis not present

## 2021-06-05 DIAGNOSIS — L089 Local infection of the skin and subcutaneous tissue, unspecified: Secondary | ICD-10-CM | POA: Insufficient documentation

## 2021-06-05 DIAGNOSIS — Z79899 Other long term (current) drug therapy: Secondary | ICD-10-CM | POA: Diagnosis not present

## 2021-06-05 DIAGNOSIS — T148XXA Other injury of unspecified body region, initial encounter: Secondary | ICD-10-CM

## 2021-06-05 DIAGNOSIS — L0390 Cellulitis, unspecified: Secondary | ICD-10-CM | POA: Diagnosis not present

## 2021-06-05 LAB — COMPREHENSIVE METABOLIC PANEL
ALT: 14 U/L (ref 0–44)
AST: 22 U/L (ref 15–41)
Albumin: 2.6 g/dL — ABNORMAL LOW (ref 3.5–5.0)
Alkaline Phosphatase: 164 U/L — ABNORMAL HIGH (ref 38–126)
Anion gap: 16 — ABNORMAL HIGH (ref 5–15)
BUN: 72 mg/dL — ABNORMAL HIGH (ref 8–23)
CO2: 27 mmol/L (ref 22–32)
Calcium: 8.4 mg/dL — ABNORMAL LOW (ref 8.9–10.3)
Chloride: 91 mmol/L — ABNORMAL LOW (ref 98–111)
Creatinine, Ser: 2.91 mg/dL — ABNORMAL HIGH (ref 0.61–1.24)
GFR, Estimated: 21 mL/min — ABNORMAL LOW (ref >60)
Glucose, Bld: 255 mg/dL — ABNORMAL HIGH (ref 70–99)
Potassium: 3 mmol/L — ABNORMAL LOW (ref 3.5–5.1)
Sodium: 134 mmol/L — ABNORMAL LOW (ref 135–145)
Total Bilirubin: 0.6 mg/dL (ref 0.3–1.2)
Total Protein: 6.6 g/dL (ref 6.5–8.1)

## 2021-06-05 LAB — LACTIC ACID, PLASMA: Lactic Acid, Venous: 1.8 mmol/L (ref 0.5–1.9)

## 2021-06-05 LAB — CBC WITH DIFFERENTIAL/PLATELET
Abs Immature Granulocytes: 0.14 10*3/uL — ABNORMAL HIGH (ref 0.00–0.07)
Basophils Absolute: 0.1 10*3/uL (ref 0.0–0.1)
Basophils Relative: 1 %
Eosinophils Absolute: 0.2 10*3/uL (ref 0.0–0.5)
Eosinophils Relative: 2 %
HCT: 37.1 % — ABNORMAL LOW (ref 39.0–52.0)
Hemoglobin: 12.5 g/dL — ABNORMAL LOW (ref 13.0–17.0)
Immature Granulocytes: 1 %
Lymphocytes Relative: 10 %
Lymphs Abs: 1 10*3/uL (ref 0.7–4.0)
MCH: 28.9 pg (ref 26.0–34.0)
MCHC: 33.7 g/dL (ref 30.0–36.0)
MCV: 85.9 fL (ref 80.0–100.0)
Monocytes Absolute: 0.6 10*3/uL (ref 0.1–1.0)
Monocytes Relative: 6 %
Neutro Abs: 8 10*3/uL — ABNORMAL HIGH (ref 1.7–7.7)
Neutrophils Relative %: 80 %
Platelets: 492 10*3/uL — ABNORMAL HIGH (ref 150–400)
RBC: 4.32 MIL/uL (ref 4.22–5.81)
RDW: 15.2 % (ref 11.5–15.5)
WBC: 10 10*3/uL (ref 4.0–10.5)
nRBC: 0 % (ref 0.0–0.2)

## 2021-06-05 MED ORDER — VANCOMYCIN HCL 1500 MG/300ML IV SOLN: 1500.0000 mg | INTRAVENOUS | Status: DC

## 2021-06-05 MED ORDER — CEFAZOLIN SODIUM-DEXTROSE 1-4 GM/50ML-% IV SOLN
1.0000 g | Freq: Once | INTRAVENOUS | Status: AC
  Administered 2021-06-05: 1 g via INTRAVENOUS
  Filled 2021-06-05: qty 50

## 2021-06-05 MED ORDER — CIPROFLOXACIN HCL 500 MG PO TABS: 500.0000 mg | ORAL_TABLET | Freq: Two times a day (BID) | ORAL | 0 refills | Status: DC

## 2021-06-05 MED ORDER — MORPHINE SULFATE (PF) 4 MG/ML IV SOLN
4.0000 mg | Freq: Once | INTRAVENOUS | Status: AC
Start: 2021-06-05 — End: 2021-06-05
  Administered 2021-06-05: 4 mg via INTRAVENOUS
  Filled 2021-06-05: qty 1

## 2021-06-05 MED ORDER — SODIUM CHLORIDE 0.9 % IV BOLUS
1000.0000 mL | Freq: Once | INTRAVENOUS | Status: AC
  Administered 2021-06-05: 1000 mL via INTRAVENOUS

## 2021-06-05 MED ORDER — VANCOMYCIN HCL 1750 MG/350ML IV SOLN
1750.0000 mg | Freq: Once | INTRAVENOUS | Status: AC
  Administered 2021-06-05: 1750 mg via INTRAVENOUS
  Filled 2021-06-05: qty 350

## 2021-06-05 MED ORDER — SILVER SULFADIAZINE 1 % EX CREA
TOPICAL_CREAM | Freq: Once | CUTANEOUS | Status: AC
  Filled 2021-06-05: qty 85

## 2021-06-05 MED ORDER — ONDANSETRON HCL 4 MG/2ML IJ SOLN
4.0000 mg | Freq: Once | INTRAMUSCULAR | Status: AC
  Administered 2021-06-05: 4 mg via INTRAVENOUS
  Filled 2021-06-05: qty 2
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
## 2021-06-05 NOTE — Progress Notes (Signed)
Pharmacy Antibiotic Note  Robert Dickerson is a 80 y.o. male admitted on 06/05/2021 with cellulitis.  Pharmacy has been consulted for vancomycin dosing.  WBC wnl, SCr elevated and slightly above baseline   Plan: -Vancomycin 1750 mg IV load followed by vancomycin 1500 mg IV Q 48 hours per traditional nomogram  -Monitor CBC, renal fx, cultures and clinical progress -Vanc levels as indicated      Temp (24hrs), Avg:97.6 F (36.4 C), Min:97.6 F (36.4 C), Max:97.6 F (36.4 C)  Recent Labs  Lab 06/05/21 1156  WBC 10.0  CREATININE 2.91*    CrCl cannot be calculated (Unknown ideal weight.).    Allergies  Allergen Reactions   Ace Inhibitors Other (See Comments) and Cough   Lisinopril Cough    Other reaction(s): Cough   Oxycodone Other (See Comments)    "out of it"     Antimicrobials this admission: Cefazolin 11/4 >>  Vancomycin 11/4 >>   Dose adjustments this admission:  Microbiology results:   Thank you for allowing pharmacy to be a part of this patient's care.  Albertina Parr, PharmD., BCPS, BCCCP Clinical Pharmacist Please refer to [facility] for unit-specific pharmacist

## 2021-06-05 NOTE — Consult Note (Signed)
On call for Dr. Sharol Given  CC: infected right pretibial hematoma post incision and drainage and skin graft coverage with sheep skin material. 80 year patient with DM right anterior medial mid distal 1/3 pretibial hematoma due to fall.  Had I&D and eventually a skin graft procedure using sheep skin. Seen by Dr. Sharol Given 10/31 and the wound noted to be deccicated and was started on silvadiene dressing 3 times weekly by [facility]. Presents to ER with right leg wound with drainage and green discloloration of the open ulcer wound.   Blood sugars are under control. Afebrile with normal vital signs. CBC with normal wbc decreased from admission lab. No antibiotics Wound evaluated, approximately 4inch x 6 inch  Open wound anteromedial right distal leg. No surrounding erythrema, mild leg swelling likely dependent. 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 Expressing the edges show no sign of abscess. Photo the area is in the chart from the most recent office visit and the wound is  Now more wet with more pseudamonas colonization. Foot and rest of leg do not appear red, warm or cellulitic.  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 This is likely grafted to induce a good granulation bed for later spit thickness grafting with patients own Skin as donor. No sign of deep infection or abscess. Plan: Patient and his family will check his temperature at least twice a day. Dressing with silvadiene then adaptic, 4x4s and multiple ABDs or combine dressings held in  Place with kerlix and avoid tape to thin skin. I will see him at the office at noon on Saturday 06/06/2021. Cipro to try and decrease the degree of colonization.  [place] Dr. Jess Barters plan is eventual STSG from patient thigh or abdomen to the wound site it granulation is acceptable.

## 2021-06-05 NOTE — ED Notes (Signed)
Pt took 325 mg of oxy while in the lobby RN notified and approved
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
## 2021-06-07 ENCOUNTER — Other Ambulatory Visit: Payer: Self-pay | Admitting: Specialist

## 2021-06-07 MED ORDER — HYDROCODONE-ACETAMINOPHEN 5-325 MG PO TABS: 1.0000 | ORAL_TABLET | Freq: Four times a day (QID) | ORAL | 0 refills | Status: DC | PRN

## 2021-06-08 DIAGNOSIS — I5032 Chronic diastolic (congestive) heart failure: Secondary | ICD-10-CM | POA: Diagnosis not present

## 2021-06-08 DIAGNOSIS — N184 Chronic kidney disease, stage 4 (severe): Secondary | ICD-10-CM | POA: Diagnosis not present

## 2021-06-08 DIAGNOSIS — E1122 Type 2 diabetes mellitus with diabetic chronic kidney disease: Secondary | ICD-10-CM | POA: Diagnosis not present

## 2021-06-08 DIAGNOSIS — M545 Low back pain, unspecified: Secondary | ICD-10-CM | POA: Diagnosis not present

## 2021-06-08 DIAGNOSIS — S81811D Laceration without foreign body, right lower leg, subsequent encounter: Secondary | ICD-10-CM | POA: Diagnosis not present

## 2021-06-08 DIAGNOSIS — G894 Chronic pain syndrome: Secondary | ICD-10-CM | POA: Diagnosis not present

## 2021-06-09 DIAGNOSIS — S81811D Laceration without foreign body, right lower leg, subsequent encounter: Secondary | ICD-10-CM | POA: Diagnosis not present

## 2021-06-09 DIAGNOSIS — I5032 Chronic diastolic (congestive) heart failure: Secondary | ICD-10-CM | POA: Diagnosis not present

## 2021-06-09 DIAGNOSIS — E1122 Type 2 diabetes mellitus with diabetic chronic kidney disease: Secondary | ICD-10-CM | POA: Diagnosis not present

## 2021-06-09 DIAGNOSIS — N184 Chronic kidney disease, stage 4 (severe): Secondary | ICD-10-CM | POA: Diagnosis not present

## 2021-06-09 DIAGNOSIS — G894 Chronic pain syndrome: Secondary | ICD-10-CM | POA: Diagnosis not present

## 2021-06-09 DIAGNOSIS — M545 Low back pain, unspecified: Secondary | ICD-10-CM | POA: Diagnosis not present

## 2021-06-10 ENCOUNTER — Encounter: Payer: Self-pay | Admitting: Podiatry

## 2021-06-10 ENCOUNTER — Ambulatory Visit (INDEPENDENT_AMBULATORY_CARE_PROVIDER_SITE_OTHER): Payer: Medicare Other | Admitting: Podiatry

## 2021-06-10 ENCOUNTER — Other Ambulatory Visit: Payer: Self-pay

## 2021-06-10 DIAGNOSIS — G609 Hereditary and idiopathic neuropathy, unspecified: Secondary | ICD-10-CM

## 2021-06-10 DIAGNOSIS — M545 Low back pain, unspecified: Secondary | ICD-10-CM | POA: Diagnosis not present

## 2021-06-10 DIAGNOSIS — B351 Tinea unguium: Secondary | ICD-10-CM

## 2021-06-10 DIAGNOSIS — M79675 Pain in left toe(s): Secondary | ICD-10-CM | POA: Diagnosis not present

## 2021-06-10 DIAGNOSIS — N184 Chronic kidney disease, stage 4 (severe): Secondary | ICD-10-CM | POA: Diagnosis not present

## 2021-06-10 DIAGNOSIS — E1122 Type 2 diabetes mellitus with diabetic chronic kidney disease: Secondary | ICD-10-CM | POA: Diagnosis not present

## 2021-06-10 DIAGNOSIS — M79674 Pain in right toe(s): Secondary | ICD-10-CM | POA: Diagnosis not present

## 2021-06-10 DIAGNOSIS — S81811D Laceration without foreign body, right lower leg, subsequent encounter: Secondary | ICD-10-CM | POA: Diagnosis not present

## 2021-06-10 DIAGNOSIS — G894 Chronic pain syndrome: Secondary | ICD-10-CM | POA: Diagnosis not present

## 2021-06-10 DIAGNOSIS — E1142 Type 2 diabetes mellitus with diabetic polyneuropathy: Secondary | ICD-10-CM

## 2021-06-10 DIAGNOSIS — I5032 Chronic diastolic (congestive) heart failure: Secondary | ICD-10-CM | POA: Diagnosis not present

## 2021-06-10 DIAGNOSIS — W450XXA Nail entering through skin, initial encounter: Secondary | ICD-10-CM | POA: Diagnosis not present

## 2021-06-10 LAB — CULTURE, BLOOD (ROUTINE X 2)
Culture: NO GROWTH
Culture: NO GROWTH
Special Requests: ADEQUATE
Special Requests: ADEQUATE

## 2021-06-10 NOTE — Addendum Note (Signed)
Addended by: Gardiner Barefoot on: 06/10/2021 09:29 AM   Modules accepted: Level of Service

## 2021-06-10 NOTE — Progress Notes (Signed)
This patient returns to the office for evaluation and treatment of long thick painful nails .  This patient is unable to trim his own nails since the patient cannot reach his feet.  Patient says the nails are painful walking and wearing his shoes. Patient has history of back surgery. Patient was in [place] in October 2022.  He says he injured his second toenail left foot.  Due to bleeding at the nail site he has applied a band-aid.  He returns for preventive foot care services.  He presents to the office with his wife.  General Appearance  Alert, conversant and in no acute stress.  Vascular  Dorsalis pedis and posterior tibial  pulses are palpable  bilaterally.  Capillary return is within normal limits  bilaterally. Temperature is within normal limits  bilaterally.  Neurologic  Senn-Weinstein monofilament wire test diminished  bilaterally. Muscle power within normal limits bilaterally.  Nails Thick disfigured discolored nails with subungual debris  from hallux to second  toes bilaterally. No evidence of bacterial infection or drainage bilaterally.Lateral distal aspect of the second toenail is absent.  Orthopedic  No limitations of motion  feet .  No crepitus or effusions noted.  No bony pathology or digital deformities noted. Prominent metatarsal heads  B/L.  Skin  Dry scaly  skin with no porokeratosis noted bilaterally.  No signs of infections or ulcers noted.     Onychomycosis  Pain in toes right foot  Pain in toes left foot  Nail injury second toenail left foot.  Debridement  of nails  1-5  B/L with a nail nipper.  Nails were then filed using a dremel tool with no incidents.  Examined and discussed nail trauma left foot.  RTC 3 months    Gardiner Barefoot DPM

## 2021-06-11 DIAGNOSIS — E1122 Type 2 diabetes mellitus with diabetic chronic kidney disease: Secondary | ICD-10-CM | POA: Diagnosis not present

## 2021-06-11 DIAGNOSIS — N184 Chronic kidney disease, stage 4 (severe): Secondary | ICD-10-CM | POA: Diagnosis not present

## 2021-06-11 DIAGNOSIS — G894 Chronic pain syndrome: Secondary | ICD-10-CM | POA: Diagnosis not present

## 2021-06-11 DIAGNOSIS — M545 Low back pain, unspecified: Secondary | ICD-10-CM | POA: Diagnosis not present

## 2021-06-11 DIAGNOSIS — S81811D Laceration without foreign body, right lower leg, subsequent encounter: Secondary | ICD-10-CM | POA: Diagnosis not present

## 2021-06-11 DIAGNOSIS — I5032 Chronic diastolic (congestive) heart failure: Secondary | ICD-10-CM | POA: Diagnosis not present

## 2021-06-15 ENCOUNTER — Other Ambulatory Visit: Payer: Self-pay

## 2021-06-15 ENCOUNTER — Ambulatory Visit (INDEPENDENT_AMBULATORY_CARE_PROVIDER_SITE_OTHER): Payer: Medicare Other | Admitting: Orthopedic Surgery

## 2021-06-15 ENCOUNTER — Encounter: Payer: Self-pay | Admitting: Orthopedic Surgery

## 2021-06-15 ENCOUNTER — Encounter: Payer: PRIVATE HEALTH INSURANCE | Admitting: Orthopedic Surgery

## 2021-06-15 DIAGNOSIS — S81811D Laceration without foreign body, right lower leg, subsequent encounter: Secondary | ICD-10-CM

## 2021-06-15 NOTE — Progress Notes (Signed)
Office Visit Note   Patient: Robert Dickerson           Date of Birth: 1940-10-07           MRN: [id number] Visit Date: 06/15/2021              Requested by: Lavone Orn, MD [address] [place],  [place] [address] PCP: Lavone Orn, MD  Chief Complaint  Patient presents with   Right Leg - Routine Post Op    05/20/21 I&D myriad skin graft       HPI: Patient is a 80 year old gentleman who presents he is about 1 month status post debridement of a large traumatic hematoma right leg as well as treatment for skin avulsion shear injuries to the right arm.  The right arm has completely healed.  Patient states the wound started looking worse this Friday and patient saw Dr. Louanne Skye on Saturday and Sunday was started on Cipro and was started on peroxide cleansing with wet-to-dry dressing changes.  Assessment & Plan: Visit Diagnoses:  1. Laceration of right lower leg, subsequent encounter     Plan: With patient's diminished pulses and worsening of the ischemic ulcer laterally and regression of the skin graft medially we will plan for consultation with vascular vein surgery to evaluate for vascular intervention to improve microcirculation.  Follow-Up Instructions: Return in about 2 weeks (around 06/29/2021).   Ortho Exam  Patient is alert, oriented, no adenopathy, well-dressed, normal affect, normal respiratory effort. Examination the bruise laterally on the right calf now has a full-thickness black eschar.  The wound bed has approximately 25% granulation tissue with remainder of fibrinous tissue.  The ulcer is 11 x 11 cm.  The Doppler was used and patient has an irregular rate consistent with his A. fib and has dampened monophasic dorsalis pedis and posterior tibial pulses.  Patient has protein caloric malnutrition as well as elevated hemoglobin A1c.  Imaging: No results found.   Labs: Lab Results  Component Value Date   HGBA1C 7.6 (H) 05/19/2021   HGBA1C 9.5 (H)  12/23/2020   LABURIC 6.4 03/04/2011   REPTSTATUS 06/10/2021 FINAL 06/05/2021   CULT  06/05/2021    NO GROWTH 5 DAYS Performed at [facility], [place] [address], [place], [place] [address]      Lab Results  Component Value Date   ALBUMIN 2.6 (L) 06/05/2021   ALBUMIN 3.2 (L) 05/18/2021   ALBUMIN 3.5 (L) 12/15/2015    Lab Results  Component Value Date   MG 2.2 05/19/2021   MG 2.0 12/23/2020   MG 2.0 12/23/2020   No results found for: VD25OH  No results found for: PREALBUMIN CBC EXTENDED Latest Ref Rng & Units 06/05/2021 05/21/2021 05/18/2021  WBC 4.0 - 10.5 K/uL 10.0 11.2(H) -  RBC 4.22 - 5.81 MIL/uL 4.32 4.02(L) -  HGB 13.0 - 17.0 g/dL 12.5(L) 11.8(L) 14.6  HCT 39.0 - 52.0 % 37.1(L) 35.5(L) 43.0  PLT 150 - 400 K/uL 492(H) 317 -  NEUTROABS 1.7 - 7.7 K/uL 8.0(H) - -  LYMPHSABS 0.7 - 4.0 K/uL 1.0 - -     There is no height or weight on file to calculate BMI.  Orders:  No orders of the defined types were placed in this encounter.  No orders of the defined types were placed in this encounter.    Procedures: No procedures performed  Clinical Data: No additional findings.  ROS:  All other systems negative, except as noted in the  HPI. Review of Systems  Objective: Vital Signs: There were no vitals taken for this visit.  Specialty Comments:  No specialty comments available.  PMFS History: Patient Active Problem List   Diagnosis Date Noted   Nail, injury by 06/10/2021   Laceration of leg 05/20/2021   Acute respiratory failure with hypoxia (HCC) 05/18/2021   Chronic diastolic CHF (congestive heart failure) (HCC) 05/18/2021   Chronic kidney disease (CKD), stage IV (severe) (HCC) 05/18/2021   Hypertension associated with diabetes (HCC) 05/18/2021   Insulin dependent type 2 diabetes mellitus (HCC) 05/18/2021   OSA on CPAP 05/18/2021   Laceration of right lower extremity 05/18/2021   Acute kidney injury superimposed on CKD (HCC) 05/18/2021   Current  chronic use of systemic steroids 01/08/2021   Preoperative cardiovascular examination 01/08/2021   Cellulitis 12/23/2020   Sepsis (HCC) 12/23/2020   Acute kidney injury superimposed on chronic kidney disease (HCC) 12/23/2020   Hypokalemia 12/23/2020   Benign neoplasm of duodenum, jejunum, 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   Chronic kidney disease, stage 4 (severe) (HCC) 12/03/2020   Degeneration of lumbar intervertebral disc 12/03/2020   Type 2 diabetes mellitus, uncontrolled, with neuropathy 12/03/2020   Diabetic peripheral neuropathy associated with type 2 diabetes mellitus (HCC) 12/03/2020   Diastolic heart failure (HCC) 12/03/2020   Enlarged prostate 12/03/2020   Erectile dysfunction due to arterial insufficiency 12/03/2020   Essential hypertension 12/03/2020   Gastro-esophageal reflux disease without esophagitis 12/03/2020   Hematuria 12/03/2020   Impaired fasting glucose 12/03/2020   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   Morbid obesity (HCC) 12/03/2020   Obstructive sleep apnea 12/03/2020   Osteoarthritis of hip 12/03/2020   Osteoarthritis of knee 12/03/2020   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   Restless legs 12/03/2020   Skin sensation disturbance 12/03/2020   Body mass index (BMI) 29.0-29.9, adult 11/21/2020   Lumbar stenosis 11/21/2020   Tendinitis 09/03/2020   Subacute arthropathy 09/03/2020   Bilateral hearing loss 08/11/2020   Impacted cerumen of left ear 03/10/2017   Excessive cerumen in left ear canal 03/10/2017   Faintness 12/09/2015   Backache 11/14/2014   CHRONIC PANCREATITIS 06/16/2009   Past Medical History:  Diagnosis Date   Arthritis    all joints   Chronic diastolic CHF (congestive heart failure) (HCC)    Chronic kidney disease (CKD), stage IV (severe) (HCC)    Chronic kidney disease, stage 3 (HCC)    Congestive heart failure with LV diastolic dysfunction, NYHA class 2 (HCC)    GERD  (gastroesophageal reflux disease)    Hypertension    Hypertension associated with diabetes (HCC)    Hypertensive chronic kidney disease    Insulin dependent type 2 diabetes mellitus (HCC)    OSA on CPAP    Sleep apnea    uses cpap, pt does not know settings    Family History  Problem Relation Age of Onset   Hypertension Mother    Anesthesia problems Neg Hx    Hypotension Neg Hx    Malignant hyperthermia Neg Hx    Pseudochol deficiency Neg Hx     Past Surgical History:  Procedure Laterality Date   ANTERIOR LAT LUMBAR FUSION Left 01/22/2021   Procedure: Lumbar one-Lumbar two Lateral Lumbar Interbody Fusion;  Surgeon: Thomas, Jonathan G, MD;  Location: [facility];  Service: Neurosurgery;  Laterality: Left;   ANTERIOR LAT LUMBAR FUSION  01/22/2021   APPENDECTOMY     BACK SURGERY     4 surg, lower     CHOLECYSTECTOMY     COLONOSCOPY WITH PROPOFOL N/A 06/11/2014   Procedure: COLONOSCOPY WITH PROPOFOL;  Surgeon: Garlan Fair, MD;  Location: [facility];  Service: Endoscopy;  Laterality: N/A;   ESOPHAGOGASTRODUODENOSCOPY  08/26/2011   Procedure: ESOPHAGOGASTRODUODENOSCOPY (EGD);  Surgeon: Garlan Fair, MD;  Location: [facility];  Service: Endoscopy;  Laterality: N/A;   ESOPHAGOGASTRODUODENOSCOPY (EGD) WITH PROPOFOL N/A 06/11/2014   Procedure: ESOPHAGOGASTRODUODENOSCOPY (EGD) WITH PROPOFOL;  Surgeon: Garlan Fair, MD;  Location: [facility];  Service: Endoscopy;  Laterality: N/A;   EUS  09/15/2011   Procedure: UPPER ENDOSCOPIC ULTRASOUND (EUS) LINEAR;  Surgeon: Landry Dyke, MD;  Location: [facility];  Service: Endoscopy;  Laterality: N/A;  MAC   I & D EXTREMITY Right 05/20/2021   Procedure: IRRIGATION AND DEBRIDEMENT OF LEG AND APPLICATION OF SKIN GRAFT;  Surgeon: Newt Minion, MD;  Location: [facility];  Service: Orthopedics;  Laterality: Right;   KNEE ARTHROSCOPY Right YEARS AGO   Social History   Occupational History   Not on file  Tobacco Use   Smoking status: Former     Packs/day: 2.00    Years: 20.00    Pack years: 40.00    Types: Cigarettes    Quit date: 34    Years since quitting: 38.8   Smokeless tobacco: Never  Substance and Sexual Activity   Alcohol use: Not Currently   Drug use: Never   Sexual activity: Not on file

## 2021-06-16 ENCOUNTER — Other Ambulatory Visit: Payer: Self-pay

## 2021-06-16 DIAGNOSIS — L98499 Non-pressure chronic ulcer of skin of other sites with unspecified severity: Secondary | ICD-10-CM

## 2021-06-17 ENCOUNTER — Telehealth: Payer: Self-pay | Admitting: Orthopedic Surgery

## 2021-06-17 DIAGNOSIS — G894 Chronic pain syndrome: Secondary | ICD-10-CM | POA: Diagnosis not present

## 2021-06-17 DIAGNOSIS — M545 Low back pain, unspecified: Secondary | ICD-10-CM | POA: Diagnosis not present

## 2021-06-17 DIAGNOSIS — S81811D Laceration without foreign body, right lower leg, subsequent encounter: Secondary | ICD-10-CM | POA: Diagnosis not present

## 2021-06-17 DIAGNOSIS — E1122 Type 2 diabetes mellitus with diabetic chronic kidney disease: Secondary | ICD-10-CM | POA: Diagnosis not present

## 2021-06-17 DIAGNOSIS — I5032 Chronic diastolic (congestive) heart failure: Secondary | ICD-10-CM | POA: Diagnosis not present

## 2021-06-17 DIAGNOSIS — N184 Chronic kidney disease, stage 4 (severe): Secondary | ICD-10-CM | POA: Diagnosis not present

## 2021-06-17 MED ORDER — HYDROCODONE-ACETAMINOPHEN 5-325 MG PO TABS: 1.0000 | ORAL_TABLET | Freq: Four times a day (QID) | ORAL | 0 refills | Status: DC | PRN

## 2021-06-17 NOTE — Addendum Note (Signed)
Addended by: Dondra Prader R on: 06/17/2021 11:46 AM   Modules accepted: Orders

## 2021-06-17 NOTE — Telephone Encounter (Signed)
Pt would like a refill on hydrocodone. [place].   CB [phone]

## 2021-06-17 NOTE — Telephone Encounter (Signed)
S/p I&D calf requesting refill on hydrocodone please advise.
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
## 2021-06-22 ENCOUNTER — Encounter: Payer: Self-pay | Admitting: Vascular Surgery

## 2021-06-22 ENCOUNTER — Ambulatory Visit (INDEPENDENT_AMBULATORY_CARE_PROVIDER_SITE_OTHER): Payer: Medicare Other | Admitting: Vascular Surgery

## 2021-06-22 ENCOUNTER — Other Ambulatory Visit: Payer: Self-pay

## 2021-06-22 ENCOUNTER — Ambulatory Visit (HOSPITAL_COMMUNITY)
Admission: RE | Admit: 2021-06-22 | Discharge: 2021-06-22 | Disposition: A | Discharge status: Home / Self Care | Payer: Medicare Other | Source: Ambulatory Visit | Attending: Vascular Surgery | Admitting: Vascular Surgery

## 2021-06-22 VITALS — BP 134/68 | HR 91 | Temp 98.3°F | Resp 16 | Ht 70.0 in | Wt 218.0 lb

## 2021-06-22 DIAGNOSIS — L98499 Non-pressure chronic ulcer of skin of other sites with unspecified severity: Secondary | ICD-10-CM | POA: Diagnosis not present

## 2021-06-22 DIAGNOSIS — I739 Peripheral vascular disease, unspecified: Secondary | ICD-10-CM

## 2021-06-25 DIAGNOSIS — I152 Hypertension secondary to endocrine disorders: Secondary | ICD-10-CM | POA: Diagnosis not present

## 2021-06-25 DIAGNOSIS — N184 Chronic kidney disease, stage 4 (severe): Secondary | ICD-10-CM | POA: Diagnosis not present

## 2021-06-25 DIAGNOSIS — Z981 Arthrodesis status: Secondary | ICD-10-CM | POA: Diagnosis not present

## 2021-06-25 DIAGNOSIS — J9601 Acute respiratory failure with hypoxia: Secondary | ICD-10-CM | POA: Diagnosis not present

## 2021-06-25 DIAGNOSIS — E1122 Type 2 diabetes mellitus with diabetic chronic kidney disease: Secondary | ICD-10-CM | POA: Diagnosis not present

## 2021-06-25 DIAGNOSIS — M545 Low back pain, unspecified: Secondary | ICD-10-CM | POA: Diagnosis not present

## 2021-06-25 DIAGNOSIS — E1159 Type 2 diabetes mellitus with other circulatory complications: Secondary | ICD-10-CM | POA: Diagnosis not present

## 2021-06-25 DIAGNOSIS — Z794 Long term (current) use of insulin: Secondary | ICD-10-CM | POA: Diagnosis not present

## 2021-06-25 DIAGNOSIS — E876 Hypokalemia: Secondary | ICD-10-CM | POA: Diagnosis not present

## 2021-06-25 DIAGNOSIS — Z7952 Long term (current) use of systemic steroids: Secondary | ICD-10-CM | POA: Diagnosis not present

## 2021-06-25 DIAGNOSIS — G4733 Obstructive sleep apnea (adult) (pediatric): Secondary | ICD-10-CM | POA: Diagnosis not present

## 2021-06-25 DIAGNOSIS — N179 Acute kidney failure, unspecified: Secondary | ICD-10-CM | POA: Diagnosis not present

## 2021-06-25 DIAGNOSIS — M50321 Other cervical disc degeneration at C4-C5 level: Secondary | ICD-10-CM | POA: Diagnosis not present

## 2021-06-25 DIAGNOSIS — S81811D Laceration without foreign body, right lower leg, subsequent encounter: Secondary | ICD-10-CM | POA: Diagnosis not present

## 2021-06-25 DIAGNOSIS — G894 Chronic pain syndrome: Secondary | ICD-10-CM | POA: Diagnosis not present

## 2021-06-25 DIAGNOSIS — G319 Degenerative disease of nervous system, unspecified: Secondary | ICD-10-CM | POA: Diagnosis not present

## 2021-06-25 DIAGNOSIS — I5032 Chronic diastolic (congestive) heart failure: Secondary | ICD-10-CM | POA: Diagnosis not present

## 2021-06-25 DIAGNOSIS — Z9981 Dependence on supplemental oxygen: Secondary | ICD-10-CM | POA: Diagnosis not present

## 2021-06-25 DIAGNOSIS — K5730 Diverticulosis of large intestine without perforation or abscess without bleeding: Secondary | ICD-10-CM | POA: Diagnosis not present

## 2021-06-25 DIAGNOSIS — M4802 Spinal stenosis, cervical region: Secondary | ICD-10-CM | POA: Diagnosis not present

## 2021-06-25 DIAGNOSIS — E669 Obesity, unspecified: Secondary | ICD-10-CM | POA: Diagnosis not present

## 2021-06-29 ENCOUNTER — Encounter: Payer: Self-pay | Admitting: Orthopedic Surgery

## 2021-06-29 ENCOUNTER — Ambulatory Visit (INDEPENDENT_AMBULATORY_CARE_PROVIDER_SITE_OTHER): Payer: Medicare Other | Admitting: Orthopedic Surgery

## 2021-06-29 DIAGNOSIS — S81811D Laceration without foreign body, right lower leg, subsequent encounter: Secondary | ICD-10-CM

## 2021-06-29 NOTE — Progress Notes (Signed)
Office Visit Note   Patient: Robert Dickerson           Date of Birth: [birth year]           MRN: [id number] Visit Date: 06/29/2021              Requested by: Lavone Orn, MD [address] [place],  [place] [address] PCP: Lavone Orn, MD  Chief Complaint  Patient presents with   Right Leg - Routine Post Op    05/20/21 I&D RLE myriad skin graft       HPI: Patient is a 80 year old gentleman who presents in follow-up for traumatic wound right leg status post myriad skin graft on 05/20/2021.  Patient has been seen by Dr. Unk Lightning and stated that he had adequate macro circulation.  Assessment & Plan: Visit Diagnoses:  1. Laceration of right lower leg, subsequent encounter     Plan: With the nonhealing of the wound and a new wound anterior laterally we will plan for debridement Wednesday surgically with placement of an installation wound VAC and return to the operating room on Friday for skin grafting.  Risk and benefits were discussed including potential for additional surgery.  Patient states he understands wished to proceed at this time.  Follow-Up Instructions: Return in about 2 weeks (around 07/13/2021).   Ortho Exam  Patient is alert, oriented, no adenopathy, well-dressed, normal affect, normal respiratory effort. Examination patient has a new wound anterior laterally with nonviable tissue in the wound which measures 2 x 4 cm.  The previous traumatic wound has hypergranulation tissue with approximately 50% fibrinous exudative tissue 50% hypergranulation tissue in the wound is 11 x 11 cm.  There is brawny skin color edema with pitting edema in the entire right lower extremity.  No cellulitis no odor no drainage.  Patient also complains from itching the only new medication he has been taking his Vicodin recommended he discontinue the Vicodin and take Benadryl.  Imaging: No results found.    Labs: Lab Results  Component Value Date   HGBA1C 7.6 (H) 05/19/2021    HGBA1C 9.5 (H) 12/23/2020   LABURIC 6.4 03/04/2011   REPTSTATUS 06/10/2021 FINAL 06/05/2021   CULT  06/05/2021    NO GROWTH 5 DAYS Performed at [facility], [place] [address], [place],  [address]      Lab Results  Component Value Date   ALBUMIN 2.6 (L) 06/05/2021   ALBUMIN 3.2 (L) 05/18/2021   ALBUMIN 3.5 (L) 12/15/2015    Lab Results  Component Value Date   MG 2.2 05/19/2021   MG 2.0 12/23/2020   MG 2.0 12/23/2020   No results found for: VD25OH  No results found for: PREALBUMIN CBC EXTENDED Latest Ref Rng & Units 06/05/2021 05/21/2021 05/18/2021  WBC 4.0 - 10.5 K/uL 10.0 11.2(H) -  RBC 4.22 - 5.81 MIL/uL 4.32 4.02(L) -  HGB 13.0 - 17.0 g/dL 12.5(L) 11.8(L) 14.6  HCT 39.0 - 52.0 % 37.1(L) 35.5(L) 43.0  PLT 150 - 400 K/uL 492(H) 317 -  NEUTROABS 1.7 - 7.7 K/uL 8.0(H) - -  LYMPHSABS 0.7 - 4.0 K/uL 1.0 - -     There is no height or weight on file to calculate BMI.  Orders:  No orders of the defined types were placed in this encounter.  No orders of the defined types were placed in this encounter.    Procedures: No procedures performed  Clinical Data: No additional findings.  ROS:  All other systems  negative, except as noted in the HPI. Review of Systems  Objective: Vital Signs: There were no vitals taken for this visit.  Specialty Comments:  No specialty comments available.  PMFS History: Patient Active Problem List   Diagnosis Date Noted   Nail, injury by 06/10/2021   Laceration of leg 05/20/2021   Acute respiratory failure with hypoxia ([place]) 05/18/2021   Chronic diastolic CHF (congestive heart failure) ([place]) 05/18/2021   Chronic kidney disease (CKD), stage IV (severe) ([place]) 05/18/2021   Hypertension associated with diabetes ([place]) 05/18/2021   Insulin dependent type 2 diabetes mellitus ([place]) 05/18/2021   OSA on CPAP 05/18/2021   Laceration of right lower extremity 05/18/2021   Acute kidney injury superimposed on CKD ([place]) 05/18/2021    Current chronic use of systemic steroids 01/08/2021   Preoperative cardiovascular examination 01/08/2021   Cellulitis 12/23/2020   Sepsis ([place]) 12/23/2020   Acute kidney injury superimposed on chronic kidney disease ([place]) 12/23/2020   Hypokalemia 12/23/2020   Benign neoplasm of duodenum, jejunum, 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   Chronic kidney disease, stage 4 (severe) ([place]) 12/03/2020   Degeneration of lumbar intervertebral disc 12/03/2020   Type 2 diabetes mellitus, uncontrolled, with neuropathy 12/03/2020   Diabetic peripheral neuropathy associated with type 2 diabetes mellitus ([place]) 78/29/5621   Diastolic heart failure ([place]) 12/03/2020   Enlarged prostate 12/03/2020   Erectile dysfunction due to arterial insufficiency 12/03/2020   Essential hypertension 12/03/2020   Gastro-esophageal reflux disease without esophagitis 12/03/2020   Hematuria 12/03/2020   Impaired fasting glucose 12/03/2020   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   Morbid obesity ([place]) 12/03/2020   Obstructive sleep apnea 12/03/2020   Osteoarthritis of hip 12/03/2020   Osteoarthritis of knee 12/03/2020   Polymyalgia rheumatica ([place]) 12/03/2020   Restless legs 12/03/2020   Skin sensation disturbance 12/03/2020   Body mass index (BMI) 29.0-29.9, adult 11/21/2020   Lumbar stenosis 11/21/2020   Tendinitis 09/03/2020   Subacute arthropathy 09/03/2020   Bilateral hearing loss 08/11/2020   Impacted cerumen of left ear 03/10/2017   Excessive cerumen in left ear canal 03/10/2017   Faintness 12/09/2015   Backache 11/14/2014   CHRONIC PANCREATITIS 06/16/2009   Past Medical History:  Diagnosis Date   Arthritis    all joints   Chronic diastolic CHF (congestive heart failure) (HCC)    Chronic kidney disease (CKD), stage IV (severe) (HCC)    Chronic kidney disease, stage 3 (HCC)    Congestive heart failure with LV diastolic dysfunction, NYHA class 2 (HCC)    GERD  (gastroesophageal reflux disease)    Hypertension    Hypertension associated with diabetes ([place])    Hypertensive chronic kidney disease    Insulin dependent type 2 diabetes mellitus (HCC)    OSA on CPAP    Sleep apnea    uses cpap, pt does not know settings    Family History  Problem Relation Age of Onset   Hypertension Mother    Anesthesia problems Neg Hx    Hypotension Neg Hx    Malignant hyperthermia Neg Hx    Pseudochol deficiency Neg Hx     Past Surgical History:  Procedure Laterality Date   ANTERIOR LAT LUMBAR FUSION Left 01/22/2021   Procedure: Lumbar one-Lumbar two Lateral Lumbar Interbody Fusion;  Surgeon: Vallarie Mare, MD;  Location: [facility];  Service: Neurosurgery;  Laterality: Left;   ANTERIOR LAT LUMBAR FUSION  01/22/2021   APPENDECTOMY     BACK SURGERY  4 surg, lower   CHOLECYSTECTOMY     COLONOSCOPY WITH PROPOFOL N/A 06/11/2014   Procedure: COLONOSCOPY WITH PROPOFOL;  Surgeon: Garlan Fair, MD;  Location: [facility];  Service: Endoscopy;  Laterality: N/A;   ESOPHAGOGASTRODUODENOSCOPY  08/26/2011   Procedure: ESOPHAGOGASTRODUODENOSCOPY (EGD);  Surgeon: Garlan Fair, MD;  Location: [facility];  Service: Endoscopy;  Laterality: N/A;   ESOPHAGOGASTRODUODENOSCOPY (EGD) WITH PROPOFOL N/A 06/11/2014   Procedure: ESOPHAGOGASTRODUODENOSCOPY (EGD) WITH PROPOFOL;  Surgeon: Garlan Fair, MD;  Location: [facility];  Service: Endoscopy;  Laterality: N/A;   EUS  09/15/2011   Procedure: UPPER ENDOSCOPIC ULTRASOUND (EUS) LINEAR;  Surgeon: Landry Dyke, MD;  Location: [facility];  Service: Endoscopy;  Laterality: N/A;  MAC   I & D EXTREMITY Right 05/20/2021   Procedure: IRRIGATION AND DEBRIDEMENT OF LEG AND APPLICATION OF SKIN GRAFT;  Surgeon: Newt Minion, MD;  Location: [facility];  Service: Orthopedics;  Laterality: Right;   KNEE ARTHROSCOPY Right YEARS AGO   Social History   Occupational History   Not on file  Tobacco Use   Smoking status: Former     Packs/day: 2.00    Years: 20.00    Pack years: 40.00    Types: Cigarettes    Quit date: 35    Years since quitting: 38.9   Smokeless tobacco: Never  Substance and Sexual Activity   Alcohol use: Not Currently   Drug use: Never   Sexual activity: Not on file

## 2021-06-30 ENCOUNTER — Encounter (HOSPITAL_COMMUNITY): Payer: Self-pay | Admitting: Orthopedic Surgery

## 2021-06-30 ENCOUNTER — Other Ambulatory Visit: Payer: Self-pay

## 2021-06-30 NOTE — Progress Notes (Signed)
[name] denies chest pain or shortness of breath at this time. Patient can walk up a flight of stairs without getting short of breath, does get short of breath going up hill. Patient states his PCP : Dr. Lavone Orn sent patient to cardiologist Dr. Benjie Karvonen to evaluate.  [name] has CHF and Kidney disease.  [name] denies having any s/s of Covid in his household.  Patient denies any known exposure to Covid.   [name] has type II diabetes, patient reports that CBG has ran 130 and less for a wile, this am CBG was 178.  I instructed [name] to take 1/2 of Lantus dose in am = 4 units, if CBG is > 70. I instructed patient to check CBG after awaking and every 2 hours until arrival  to the hospital.  I Instructed patient if CBG is less than 70 to take 4 Glucose Tablets or 1 tube of Glucose Gel or 1/2 cup of a clear juice. Recheck CBG in 15 minutes if CBG is not over 70 call, pre- op desk at [phone] for further instructions. If scheduled to receive Insulin, do not take Insulin   I instructed patient to shower with antibiotic soap, if it is available.  Dry off with a clean towel. Do not put lotion, powder, cologne or deodorant or makeup.No jewelry or piercings. Men may shave their face and neck. Woman should not shave. No nail polish, artificial or acrylic nails. Wear clean clothes, brush your teeth. Glasses, contact lens,dentures or partials may not be worn in the OR. If you need to wear them, please bring a case for glasses, do not wear contacts or bring a case, the hospital does not have contact cases, dentures or partials will have to be removed , make sure they are clean, we will provide a denture cup to put them in. You will need some one to drive you home and a responsible person over the age of 37 to stay with you for the first 24 hours after surgery.

## 2021-07-01 ENCOUNTER — Inpatient Hospital Stay (HOSPITAL_COMMUNITY)
Admission: RE | Admit: 2021-07-01 | Discharge: 2021-07-04 | DRG: 574 | Disposition: A | Discharge status: Home / Self Care | Payer: Medicare Other | Attending: Orthopedic Surgery | Admitting: Orthopedic Surgery

## 2021-07-01 ENCOUNTER — Encounter (HOSPITAL_COMMUNITY): Payer: Self-pay | Admitting: Orthopedic Surgery

## 2021-07-01 ENCOUNTER — Encounter (HOSPITAL_COMMUNITY)
Admission: RE | Disposition: A | Discharge status: Home / Self Care | Payer: Self-pay | Source: Home / Self Care | Attending: Orthopedic Surgery

## 2021-07-01 ENCOUNTER — Other Ambulatory Visit: Payer: Self-pay

## 2021-07-01 ENCOUNTER — Inpatient Hospital Stay (HOSPITAL_COMMUNITY): Payer: Medicare Other | Admitting: Anesthesiology

## 2021-07-01 DIAGNOSIS — Z87891 Personal history of nicotine dependence: Secondary | ICD-10-CM | POA: Diagnosis not present

## 2021-07-01 DIAGNOSIS — E1142 Type 2 diabetes mellitus with diabetic polyneuropathy: Secondary | ICD-10-CM | POA: Diagnosis not present

## 2021-07-01 DIAGNOSIS — K219 Gastro-esophageal reflux disease without esophagitis: Secondary | ICD-10-CM | POA: Diagnosis present

## 2021-07-01 DIAGNOSIS — Z20822 Contact with and (suspected) exposure to covid-19: Secondary | ICD-10-CM | POA: Diagnosis present

## 2021-07-01 DIAGNOSIS — Z794 Long term (current) use of insulin: Secondary | ICD-10-CM | POA: Diagnosis not present

## 2021-07-01 DIAGNOSIS — Z8249 Family history of ischemic heart disease and other diseases of the circulatory system: Secondary | ICD-10-CM

## 2021-07-01 DIAGNOSIS — Z7952 Long term (current) use of systemic steroids: Secondary | ICD-10-CM | POA: Diagnosis not present

## 2021-07-01 DIAGNOSIS — M159 Polyosteoarthritis, unspecified: Secondary | ICD-10-CM | POA: Diagnosis present

## 2021-07-01 DIAGNOSIS — Z885 Allergy status to narcotic agent status: Secondary | ICD-10-CM | POA: Diagnosis not present

## 2021-07-01 DIAGNOSIS — T8189XA Other complications of procedures, not elsewhere classified, initial encounter: Secondary | ICD-10-CM | POA: Diagnosis not present

## 2021-07-01 DIAGNOSIS — Z79899 Other long term (current) drug therapy: Secondary | ICD-10-CM

## 2021-07-01 DIAGNOSIS — I5032 Chronic diastolic (congestive) heart failure: Secondary | ICD-10-CM | POA: Diagnosis present

## 2021-07-01 DIAGNOSIS — E1122 Type 2 diabetes mellitus with diabetic chronic kidney disease: Secondary | ICD-10-CM | POA: Diagnosis not present

## 2021-07-01 DIAGNOSIS — L98499 Non-pressure chronic ulcer of skin of other sites with unspecified severity: Secondary | ICD-10-CM | POA: Diagnosis not present

## 2021-07-01 DIAGNOSIS — Z981 Arthrodesis status: Secondary | ICD-10-CM

## 2021-07-01 DIAGNOSIS — N184 Chronic kidney disease, stage 4 (severe): Secondary | ICD-10-CM | POA: Diagnosis not present

## 2021-07-01 DIAGNOSIS — L97919 Non-pressure chronic ulcer of unspecified part of right lower leg with unspecified severity: Principal | ICD-10-CM | POA: Diagnosis present

## 2021-07-01 DIAGNOSIS — S81801S Unspecified open wound, right lower leg, sequela: Secondary | ICD-10-CM | POA: Diagnosis not present

## 2021-07-01 DIAGNOSIS — E1169 Type 2 diabetes mellitus with other specified complication: Secondary | ICD-10-CM | POA: Diagnosis present

## 2021-07-01 DIAGNOSIS — Z888 Allergy status to other drugs, medicaments and biological substances status: Secondary | ICD-10-CM

## 2021-07-01 DIAGNOSIS — E1165 Type 2 diabetes mellitus with hyperglycemia: Secondary | ICD-10-CM | POA: Diagnosis not present

## 2021-07-01 DIAGNOSIS — E11622 Type 2 diabetes mellitus with other skin ulcer: Secondary | ICD-10-CM | POA: Diagnosis not present

## 2021-07-01 DIAGNOSIS — I130 Hypertensive heart and chronic kidney disease with heart failure and stage 1 through stage 4 chronic kidney disease, or unspecified chronic kidney disease: Secondary | ICD-10-CM | POA: Diagnosis not present

## 2021-07-01 DIAGNOSIS — G4733 Obstructive sleep apnea (adult) (pediatric): Secondary | ICD-10-CM | POA: Diagnosis present

## 2021-07-01 DIAGNOSIS — E11621 Type 2 diabetes mellitus with foot ulcer: Secondary | ICD-10-CM | POA: Diagnosis not present

## 2021-07-01 DIAGNOSIS — I152 Hypertension secondary to endocrine disorders: Secondary | ICD-10-CM | POA: Diagnosis present

## 2021-07-01 DIAGNOSIS — Y838 Other surgical procedures as the cause of abnormal reaction of the patient, or of later complication, without mention of misadventure at the time of the procedure: Secondary | ICD-10-CM | POA: Diagnosis present

## 2021-07-01 DIAGNOSIS — E1140 Type 2 diabetes mellitus with diabetic neuropathy, unspecified: Secondary | ICD-10-CM | POA: Diagnosis not present

## 2021-07-01 DIAGNOSIS — S81811S Laceration without foreign body, right lower leg, sequela: Secondary | ICD-10-CM

## 2021-07-01 DIAGNOSIS — M79661 Pain in right lower leg: Secondary | ICD-10-CM | POA: Diagnosis not present

## 2021-07-01 HISTORY — PX: I & D EXTREMITY: SHX5045

## 2021-07-01 HISTORY — DX: Polyneuropathy, unspecified: G62.9

## 2021-07-01 HISTORY — DX: Family history of other specified conditions: Z84.89

## 2021-07-01 LAB — POCT I-STAT, CHEM 8
BUN: 45 mg/dL — ABNORMAL HIGH (ref 8–23)
Calcium, Ion: 1.08 mmol/L — ABNORMAL LOW (ref 1.15–1.40)
Chloride: 97 mmol/L — ABNORMAL LOW (ref 98–111)
Creatinine, Ser: 2.2 mg/dL — ABNORMAL HIGH (ref 0.61–1.24)
Glucose, Bld: 191 mg/dL — ABNORMAL HIGH (ref 70–99)
HCT: 40 % (ref 39.0–52.0)
Hemoglobin: 13.6 g/dL (ref 13.0–17.0)
Potassium: 3.2 mmol/L — ABNORMAL LOW (ref 3.5–5.1)
Sodium: 136 mmol/L (ref 135–145)
TCO2: 31 mmol/L (ref 22–32)

## 2021-07-01 LAB — GLUCOSE, CAPILLARY
Glucose-Capillary: 199 mg/dL — ABNORMAL HIGH (ref 70–99)
Glucose-Capillary: 157 mg/dL — ABNORMAL HIGH (ref 70–99)
Glucose-Capillary: 137 mg/dL — ABNORMAL HIGH (ref 70–99)
Glucose-Capillary: 270 mg/dL — ABNORMAL HIGH (ref 70–99)
Glucose-Capillary: 188 mg/dL — ABNORMAL HIGH (ref 70–99)

## 2021-07-01 LAB — SARS CORONAVIRUS 2 BY RT PCR (HOSPITAL ORDER, PERFORMED IN ~~LOC~~ HOSPITAL LAB): SARS Coronavirus 2: NEGATIVE

## 2021-07-01 SURGERY — IRRIGATION AND DEBRIDEMENT EXTREMITY
Anesthesia: Monitor Anesthesia Care | Laterality: Right

## 2021-07-01 MED ORDER — HYDROCODONE-ACETAMINOPHEN 7.5-325 MG PO TABS
1.0000 | ORAL_TABLET | ORAL | Status: DC | PRN
  Administered 2021-07-02 – 2021-07-04 (×5): 2 via ORAL
  Filled 2021-07-01 (×5): qty 2

## 2021-07-01 MED ORDER — ACETAMINOPHEN 325 MG PO TABS: 325.0000 mg | ORAL_TABLET | Freq: Four times a day (QID) | ORAL | Status: DC | PRN

## 2021-07-01 MED ORDER — ALLOPURINOL 300 MG PO TABS
300.0000 mg | ORAL_TABLET | Freq: Every day | ORAL | Status: DC
  Administered 2021-07-01 – 2021-07-04 (×4): 300 mg via ORAL
  Filled 2021-07-01 (×4): qty 1

## 2021-07-01 MED ORDER — HYDROCODONE-ACETAMINOPHEN 5-325 MG PO TABS
ORAL_TABLET | ORAL | Status: AC
  Filled 2021-07-01: qty 1

## 2021-07-01 MED ORDER — ORAL CARE MOUTH RINSE: 15.0000 mL | Freq: Once | OROMUCOSAL | Status: AC

## 2021-07-01 MED ORDER — PROPOFOL 500 MG/50ML IV EMUL
INTRAVENOUS | Status: DC | PRN
  Administered 2021-07-01: 75 ug/kg/min via INTRAVENOUS

## 2021-07-01 MED ORDER — METHOCARBAMOL 1000 MG/10ML IJ SOLN
500.0000 mg | Freq: Four times a day (QID) | INTRAVENOUS | Status: DC | PRN
  Filled 2021-07-01 (×3): qty 5

## 2021-07-01 MED ORDER — POLYETHYLENE GLYCOL 3350 17 G PO PACK: 17.0000 g | PACK | Freq: Every day | ORAL | Status: DC | PRN

## 2021-07-01 MED ORDER — FENTANYL CITRATE (PF) 250 MCG/5ML IJ SOLN
INTRAMUSCULAR | Status: AC
  Filled 2021-07-01: qty 5

## 2021-07-01 MED ORDER — FUROSEMIDE 40 MG PO TABS
160.0000 mg | ORAL_TABLET | Freq: Every day | ORAL | Status: DC
  Administered 2021-07-01 – 2021-07-04 (×4): 160 mg via ORAL
  Filled 2021-07-01 (×4): qty 4

## 2021-07-01 MED ORDER — CHLORHEXIDINE GLUCONATE 0.12 % MT SOLN
15.0000 mL | Freq: Once | OROMUCOSAL | Status: AC
  Administered 2021-07-01: 15 mL via OROMUCOSAL
  Filled 2021-07-01: qty 15

## 2021-07-01 MED ORDER — ONDANSETRON HCL 4 MG/2ML IJ SOLN: 4.0000 mg | Freq: Once | INTRAMUSCULAR | Status: DC | PRN

## 2021-07-01 MED ORDER — FENTANYL CITRATE (PF) 100 MCG/2ML IJ SOLN
25.0000 ug | INTRAMUSCULAR | Status: DC | PRN
  Administered 2021-07-01 (×2): 25 ug via INTRAVENOUS

## 2021-07-01 MED ORDER — MORPHINE SULFATE (PF) 2 MG/ML IV SOLN
0.5000 mg | INTRAVENOUS | Status: DC | PRN
  Administered 2021-07-03 (×2): 1 mg via INTRAVENOUS
  Filled 2021-07-01 (×2): qty 1

## 2021-07-01 MED ORDER — DOCUSATE SODIUM 100 MG PO CAPS
100.0000 mg | ORAL_CAPSULE | Freq: Two times a day (BID) | ORAL | Status: DC
  Administered 2021-07-01 – 2021-07-04 (×6): 100 mg via ORAL
  Filled 2021-07-01 (×6): qty 1

## 2021-07-01 MED ORDER — ROPIVACAINE HCL 5 MG/ML IJ SOLN
INTRAMUSCULAR | Status: DC | PRN
  Administered 2021-07-01: 40 mL via PERINEURAL

## 2021-07-01 MED ORDER — CEFAZOLIN SODIUM-DEXTROSE 2-4 GM/100ML-% IV SOLN
2.0000 g | INTRAVENOUS | Status: AC
  Administered 2021-07-01: 2 g via INTRAVENOUS
  Filled 2021-07-01: qty 100

## 2021-07-01 MED ORDER — INSULIN GLARGINE 100 UNIT/ML SOLOSTAR PEN: 8.0000 [IU] | PEN_INJECTOR | Freq: Every morning | SUBCUTANEOUS | Status: DC

## 2021-07-01 MED ORDER — INSULIN GLARGINE 100 UNIT/ML ~~LOC~~ SOLN
8.0000 [IU] | Freq: Every day | SUBCUTANEOUS | Status: DC
  Administered 2021-07-02 – 2021-07-04 (×3): 8 [IU] via SUBCUTANEOUS
  Filled 2021-07-01 (×3): qty 0.08

## 2021-07-01 MED ORDER — HYDROCODONE-ACETAMINOPHEN 5-325 MG PO TABS
1.0000 | ORAL_TABLET | ORAL | Status: DC | PRN
  Administered 2021-07-01 (×2): 1 via ORAL
  Filled 2021-07-01: qty 1
  Filled 2021-07-01: qty 2

## 2021-07-01 MED ORDER — ONDANSETRON HCL 4 MG PO TABS: 4.0000 mg | ORAL_TABLET | Freq: Four times a day (QID) | ORAL | Status: DC | PRN

## 2021-07-01 MED ORDER — BISACODYL 10 MG RE SUPP: 10.0000 mg | Freq: Every day | RECTAL | Status: DC | PRN

## 2021-07-01 MED ORDER — 0.9 % SODIUM CHLORIDE (POUR BTL) OPTIME
TOPICAL | Status: DC | PRN
  Administered 2021-07-01: 1000 mL

## 2021-07-01 MED ORDER — DEXAMETHASONE SODIUM PHOSPHATE 10 MG/ML IJ SOLN
INTRAMUSCULAR | Status: DC | PRN
  Administered 2021-07-01: 10 mg

## 2021-07-01 MED ORDER — SODIUM CHLORIDE 0.9 % IV SOLN: INTRAVENOUS | Status: DC

## 2021-07-01 MED ORDER — ACETAMINOPHEN 500 MG PO TABS
1000.0000 mg | ORAL_TABLET | Freq: Once | ORAL | Status: AC
  Administered 2021-07-01: 1000 mg via ORAL
  Filled 2021-07-01: qty 2

## 2021-07-01 MED ORDER — CEFAZOLIN SODIUM-DEXTROSE 1-4 GM/50ML-% IV SOLN
1.0000 g | Freq: Three times a day (TID) | INTRAVENOUS | Status: DC
  Administered 2021-07-01 – 2021-07-04 (×8): 1 g via INTRAVENOUS
  Filled 2021-07-01 (×8): qty 50

## 2021-07-01 MED ORDER — METOCLOPRAMIDE HCL 5 MG PO TABS: 5.0000 mg | ORAL_TABLET | Freq: Three times a day (TID) | ORAL | Status: DC | PRN

## 2021-07-01 MED ORDER — DEXMEDETOMIDINE (PRECEDEX) IN NS 20 MCG/5ML (4 MCG/ML) IV SYRINGE
PREFILLED_SYRINGE | INTRAVENOUS | Status: DC | PRN
  Administered 2021-07-01: 8 ug via INTRAVENOUS

## 2021-07-01 MED ORDER — PREDNISONE 5 MG PO TABS
5.0000 mg | ORAL_TABLET | Freq: Every day | ORAL | Status: DC
  Administered 2021-07-02 – 2021-07-04 (×3): 5 mg via ORAL
  Filled 2021-07-01 (×3): qty 1

## 2021-07-01 MED ORDER — METHOCARBAMOL 500 MG PO TABS: 500.0000 mg | ORAL_TABLET | Freq: Four times a day (QID) | ORAL | Status: DC | PRN

## 2021-07-01 MED ORDER — POTASSIUM CHLORIDE CRYS ER 20 MEQ PO TBCR
20.0000 meq | EXTENDED_RELEASE_TABLET | Freq: Every day | ORAL | Status: DC
  Administered 2021-07-01 – 2021-07-04 (×4): 20 meq via ORAL
  Filled 2021-07-01 (×4): qty 1

## 2021-07-01 MED ORDER — LACTATED RINGERS IV SOLN: INTRAVENOUS | Status: DC

## 2021-07-01 MED ORDER — FENTANYL CITRATE (PF) 100 MCG/2ML IJ SOLN: 50.0000 ug | Freq: Once | INTRAMUSCULAR | Status: AC

## 2021-07-01 MED ORDER — ONDANSETRON HCL 4 MG/2ML IJ SOLN
INTRAMUSCULAR | Status: AC
  Filled 2021-07-01: qty 2

## 2021-07-01 MED ORDER — ZOLPIDEM TARTRATE 5 MG PO TABS: 5.0000 mg | ORAL_TABLET | Freq: Every evening | ORAL | Status: DC | PRN

## 2021-07-01 MED ORDER — FENTANYL CITRATE (PF) 250 MCG/5ML IJ SOLN
INTRAMUSCULAR | Status: DC | PRN
  Administered 2021-07-01 (×2): 50 ug via INTRAVENOUS

## 2021-07-01 MED ORDER — METOCLOPRAMIDE HCL 5 MG/ML IJ SOLN: 5.0000 mg | Freq: Three times a day (TID) | INTRAMUSCULAR | Status: DC | PRN

## 2021-07-01 MED ORDER — PREGABALIN 75 MG PO CAPS
75.0000 mg | ORAL_CAPSULE | Freq: Two times a day (BID) | ORAL | Status: DC
  Administered 2021-07-01 – 2021-07-04 (×6): 75 mg via ORAL
  Filled 2021-07-01 (×6): qty 1

## 2021-07-01 MED ORDER — METOLAZONE 5 MG PO TABS
5.0000 mg | ORAL_TABLET | Freq: Every morning | ORAL | Status: DC
  Administered 2021-07-02 – 2021-07-04 (×3): 5 mg via ORAL
  Filled 2021-07-01 (×3): qty 1

## 2021-07-01 MED ORDER — INSULIN ASPART 100 UNIT/ML IJ SOLN
0.0000 [IU] | Freq: Three times a day (TID) | INTRAMUSCULAR | Status: DC
  Administered 2021-07-01: 8 [IU] via SUBCUTANEOUS
  Administered 2021-07-02: 3 [IU] via SUBCUTANEOUS
  Administered 2021-07-02: 2 [IU] via SUBCUTANEOUS
  Administered 2021-07-02: 3 [IU] via SUBCUTANEOUS
  Administered 2021-07-03: 2 [IU] via SUBCUTANEOUS
  Administered 2021-07-03: 3 [IU] via SUBCUTANEOUS

## 2021-07-01 MED ORDER — DIPHENHYDRAMINE HCL 12.5 MG/5ML PO ELIX: 12.5000 mg | ORAL_SOLUTION | ORAL | Status: DC | PRN

## 2021-07-01 MED ORDER — INSULIN ASPART 100 UNIT/ML IJ SOLN
4.0000 [IU] | Freq: Three times a day (TID) | INTRAMUSCULAR | Status: DC
  Administered 2021-07-01 – 2021-07-04 (×7): 4 [IU] via SUBCUTANEOUS

## 2021-07-01 MED ORDER — AMLODIPINE BESYLATE 10 MG PO TABS
10.0000 mg | ORAL_TABLET | Freq: Every day | ORAL | Status: DC
  Administered 2021-07-02 – 2021-07-04 (×3): 10 mg via ORAL
  Filled 2021-07-01 (×4): qty 1

## 2021-07-01 MED ORDER — FENTANYL CITRATE (PF) 100 MCG/2ML IJ SOLN
INTRAMUSCULAR | Status: AC
  Filled 2021-07-01: qty 2

## 2021-07-01 MED ORDER — FENTANYL CITRATE (PF) 100 MCG/2ML IJ SOLN
INTRAMUSCULAR | Status: AC
  Administered 2021-07-01: 50 ug via INTRAVENOUS
  Filled 2021-07-01: qty 2

## 2021-07-01 MED ORDER — INSULIN ASPART 100 UNIT/ML IJ SOLN
INTRAMUSCULAR | Status: AC
  Administered 2021-07-01: 5 [IU] via SUBCUTANEOUS
  Filled 2021-07-01: qty 1

## 2021-07-01 MED ORDER — ONDANSETRON HCL 4 MG/2ML IJ SOLN: 4.0000 mg | Freq: Four times a day (QID) | INTRAMUSCULAR | Status: DC | PRN

## 2021-07-01 MED ORDER — INSULIN ASPART 100 UNIT/ML IJ SOLN: 5.0000 [IU] | Freq: Once | INTRAMUSCULAR | Status: AC

## 2021-07-01 MED ORDER — CEFAZOLIN SODIUM-DEXTROSE 1-4 GM/50ML-% IV SOLN
1.0000 g | Freq: Four times a day (QID) | INTRAVENOUS | Status: DC
  Administered 2021-07-01: 1 g via INTRAVENOUS
  Filled 2021-07-01: qty 50

## 2021-07-01 SURGICAL SUPPLY — 34 items
BAG COUNTER SPONGE SURGICOUNT (BAG) IMPLANT
BAG SPNG CNTER NS LX DISP (BAG)
BLADE SURG 21 STRL SS (BLADE) ×2 IMPLANT
BNDG COHESIVE 6X5 TAN NS LF (GAUZE/BANDAGES/DRESSINGS) ×1 IMPLANT
BNDG COHESIVE 6X5 TAN STRL LF (GAUZE/BANDAGES/DRESSINGS) IMPLANT
BNDG GAUZE ELAST 4 BULKY (GAUZE/BANDAGES/DRESSINGS) ×4 IMPLANT
CASSETTE VERAFLO VERALINK (MISCELLANEOUS) ×1 IMPLANT
COVER SURGICAL LIGHT HANDLE (MISCELLANEOUS) ×4 IMPLANT
DRAPE U-SHAPE 47X51 STRL (DRAPES) ×2 IMPLANT
DRSG ADAPTIC 3X8 NADH LF (GAUZE/BANDAGES/DRESSINGS) ×2 IMPLANT
DRSG VERAFLO VAC MED (GAUZE/BANDAGES/DRESSINGS) ×1 IMPLANT
DURAPREP 26ML APPLICATOR (WOUND CARE) ×2 IMPLANT
ELECT REM PT RETURN 9FT ADLT (ELECTROSURGICAL)
ELECTRODE REM PT RTRN 9FT ADLT (ELECTROSURGICAL) IMPLANT
GAUZE SPONGE 4X4 12PLY STRL (GAUZE/BANDAGES/DRESSINGS) ×2 IMPLANT
GLOVE SURG ORTHO LTX SZ9 (GLOVE) ×2 IMPLANT
GLOVE SURG UNDER POLY LF SZ9 (GLOVE) ×2 IMPLANT
GOWN STRL REUS W/ TWL XL LVL3 (GOWN DISPOSABLE) ×2 IMPLANT
GOWN STRL REUS W/TWL XL LVL3 (GOWN DISPOSABLE) ×4
HANDPIECE INTERPULSE COAX TIP (DISPOSABLE)
KIT BASIN OR (CUSTOM PROCEDURE TRAY) ×2 IMPLANT
KIT TURNOVER KIT B (KITS) ×2 IMPLANT
MANIFOLD NEPTUNE II (INSTRUMENTS) ×2 IMPLANT
NS IRRIG 1000ML POUR BTL (IV SOLUTION) ×2 IMPLANT
PACK ORTHO EXTREMITY (CUSTOM PROCEDURE TRAY) ×2 IMPLANT
PAD ARMBOARD 7.5X6 YLW CONV (MISCELLANEOUS) ×4 IMPLANT
SET HNDPC FAN SPRY TIP SCT (DISPOSABLE) IMPLANT
STOCKINETTE IMPERVIOUS 9X36 MD (GAUZE/BANDAGES/DRESSINGS) IMPLANT
SUT ETHILON 2 0 PSLX (SUTURE) ×2 IMPLANT
SWAB COLLECTION DEVICE MRSA (MISCELLANEOUS) ×2 IMPLANT
SWAB CULTURE ESWAB REG 1ML (MISCELLANEOUS) IMPLANT
TOWEL GREEN STERILE (TOWEL DISPOSABLE) ×2 IMPLANT
TUBE CONNECTING 12X1/4 (SUCTIONS) ×2 IMPLANT
YANKAUER SUCT BULB TIP NO VENT (SUCTIONS) ×2 IMPLANT

## 2021-07-01 NOTE — Anesthesia Preprocedure Evaluation (Addendum)
Anesthesia Evaluation  Patient identified by MRN, date of birth, ID band Patient awake    Reviewed: Allergy & Precautions, NPO status , Patient's Chart, lab work & pertinent test results  Airway Mallampati: III  TM Distance: >3 FB Neck ROM: Full    Dental  (+) Dental Advisory Given, Chipped, Missing,    Pulmonary sleep apnea and Continuous Positive Airway Pressure Ventilation , former smoker,  Quit smoking 1984, 40 pack year history    Pulmonary exam normal breath sounds clear to auscultation       Cardiovascular hypertension, Pt. on medications +CHF (grade 1 diastolic dysfunction)  Normal cardiovascular exam Rhythm:Regular Rate:Normal  Echo 2015: - Left ventricle: The cavity size was normal. Wall thickness  was normal. Systolic function was low normal to mildly  reduced. The estimated ejection fraction was in the range  of 50% to 55%. Wall motion was normal; there were no  regional wall motion abnormalities. Doppler parameters are  consistent with abnormal left ventricular relaxation  (grade 1 diastolic dysfunction).  - Aortic valve: There was no stenosis. Trivial  regurgitation.  - Aorta: Mildly dilated aortic root.  - Mitral valve: Mildly calcified annulus. Mildly calcified  leaflets . Trivial regurgitation.  - Left atrium: The atrium was mildly dilated.  - Right ventricle: The cavity size was normal. Systolic  function was normal.  - Pulmonary arteries: No complete TR doppler jet so unable  to estimate PA systolic pressure.     Neuro/Psych  Neuromuscular disease (peripheral neuropathy 2/2 T2DM) negative psych ROS   GI/Hepatic Neg liver ROS, GERD  Controlled,  Endo/Other  diabetes, Well Controlled, Type 2, Insulin Dependenta1c 7.6 FS 199 in preop, took 4 units this AM  Renal/GU Renal InsufficiencyRenal diseaseCKD 3  negative genitourinary   Musculoskeletal  (+) Arthritis , Osteoarthritis,  R  leg ulcer   Abdominal (+) + obese,   Peds  Hematology negative hematology ROS (+)   Anesthesia Other Findings Chronic steroids-   Reproductive/Obstetrics negative OB ROS                            Anesthesia Physical Anesthesia Plan  ASA: 3  Anesthesia Plan: MAC and Regional   Post-op Pain Management: Regional block and Tylenol PO (pre-op)   Induction:   PONV Risk Score and Plan: 1 and Ondansetron, Dexamethasone, Treatment may vary due to age or medical condition, Propofol infusion and TIVA  Airway Management Planned: Natural Airway and Simple Face Mask  Additional Equipment: None  Intra-op Plan:   Post-operative Plan:   Informed Consent: I have reviewed the patients History and Physical, chart, labs and discussed the procedure including the risks, benefits and alternatives for the proposed anesthesia with the patient or authorized representative who has indicated his/her understanding and acceptance.     Dental advisory given  Plan Discussed with: CRNA  Anesthesia Plan Comments:        Anesthesia Quick Evaluation

## 2021-07-01 NOTE — Op Note (Signed)
07/01/2021  9:42 AM  PATIENT:  Robert Dickerson    PRE-OPERATIVE DIAGNOSIS:  Right Leg Ulcer  POST-OPERATIVE DIAGNOSIS:  Same  PROCEDURE:  RIGHT LEG EXCISIONAL DEBRIDEMENT of skin and soft tissue muscle and fascia. Application of cleanse choice wound VAC sponge.  SURGEON:  Newt Minion, MD  PHYSICIAN ASSISTANT:None ANESTHESIA:   General  PREOPERATIVE INDICATIONS:  Robert Dickerson is a  80 y.o. male with a diagnosis of Right Leg Ulcer who failed conservative measures and elected for surgical management.    The risks benefits and alternatives were discussed with the patient preoperatively including but not limited to the risks of infection, bleeding, nerve injury, cardiopulmonary complications, the need for revision surgery, among others, and the patient was willing to proceed.  OPERATIVE IMPLANTS: Blue cleanse choice wound VAC sponge x1  _0 @  OPERATIVE FINDINGS: Good bleeding granulation tissue.  OPERATIVE PROCEDURE: Patient was brought the operating room after undergoing a regional anesthetic he then underwent a MAC anesthetic.  After adequate levels anesthesia were obtained patient's right lower extremity was prepped using DuraPrep draped into a sterile field a timeout was called.  A 21 blade knife was used to excise skin and soft tissue muscle and fascia from the 2 wounds.  This left a wound that was 13 x 11 cm medially and a wound that was 5 x 3 cm laterally.  The wound was irrigated normal saline tissue was sent for cultures.  The [place] between the 2 wounds was covered with derma tack the cleanse choice sponge was applied this was secured with derma tack this had a good suction fit leg was overwrapped with [place] patient was taken the PACU in stable condition.  Debridement type: Excisional Debridement  Side: right  Body Location: Right leg  Tools used for debridement: scalpel  Pre-debridement Wound size (cm):   Length: 7        Width: 7     Depth: 1   Post-debridement  Wound size (cm):   Length: 13        Width: 11     Depth: 1   Debridement depth beyond dead/damaged tissue down to healthy viable tissue: yes  Tissue layer involved: skin, subcutaneous tissue, muscle / fascia  Nature of tissue removed: Slough, Nutritional therapist, and Non-viable tissue  Irrigation volume: 1 liter     Irrigation fluid type: Normal Saline     DISCHARGE PLANNING:  Antibiotic duration: Continue IV antibiotics.  Weightbearing: Weightbearing as tolerated  Pain medication: Opioid pathway  Dressing care/ Wound VAC: Cleanse choice wound VAC  Ambulatory devices: Walker  Discharge to: Plan for return to the operating room on Friday for skin graft.  IV antibiotics adjust according to cultures  Follow-up: In the office 1 week post operative.

## 2021-07-01 NOTE — Transfer of Care (Signed)
Immediate Anesthesia Transfer of Care Note  Patient: Robert Dickerson  Procedure(s) Performed: RIGHT LEG DEBRIDEMENT (Right)  Patient Location: PACU  Anesthesia Type:MAC and Regional  Level of Consciousness: drowsy  Airway & Oxygen Therapy: Patient Spontanous Breathing  Post-op Assessment: Report given to RN and Post -op Vital signs reviewed and stable  Post vital signs: Reviewed and stable  Last Vitals:  Vitals Value Taken Time  BP    Temp    Pulse 79 07/01/21 0941  Resp 22 07/01/21 0941  SpO2 97 % 07/01/21 0941  Vitals shown include unvalidated device data.  Last Pain:  Vitals:   07/01/21 0721  TempSrc:   PainSc: 5       Patients Stated Pain Goal: 0 (60/02/98 4730)  Complications: No notable events documented.

## 2021-07-01 NOTE — Anesthesia Procedure Notes (Signed)
Anesthesia Regional Block: Adductor canal block   Pre-Anesthetic Checklist: , timeout performed,  Correct Patient, Correct Site, Correct Laterality,  Correct Procedure, Correct Position, site marked,  Risks and benefits discussed,  Surgical consent,  Pre-op evaluation,  At surgeon's request and post-op pain management  Laterality: Right  Prep: Maximum Sterile Barrier Precautions used, chloraprep       Needles:  Injection technique: Single-shot  Needle Type: Echogenic Stimulator Needle     Needle Length: 9cm  Needle Gauge: 22     Additional Needles:   Procedures:,,,, ultrasound used (permanent image in chart),,    Narrative:  Start time: 07/01/2021 7:55 AM End time: 07/01/2021 8:00 AM Injection made incrementally with aspirations every 5 mL.  Performed by: Personally  Anesthesiologist: Pervis Hocking, DO  Additional Notes: Monitors applied. No increased pain on injection. No increased resistance to injection. Injection made in 5cc increments. Good needle visualization. Patient tolerated procedure well.

## 2021-07-01 NOTE — Addendum Note (Signed)
Addendum  created 07/01/21 1246 by Pervis Hocking, DO   Clinical Note Signed

## 2021-07-01 NOTE — Anesthesia Postprocedure Evaluation (Addendum)
Anesthesia Post Note  Patient: Robert Dickerson  Procedure(s) Performed: RIGHT LEG DEBRIDEMENT (Right)     Patient location during evaluation: PACU Anesthesia Type: Regional Level of consciousness: awake and alert Pain management: pain level controlled Vital Signs Assessment: post-procedure vital signs reviewed and stable Respiratory status: spontaneous breathing, nonlabored ventilation and respiratory function stable Cardiovascular status: blood pressure returned to baseline and stable Postop Assessment: no apparent nausea or vomiting Anesthetic complications: no Comments: Called to PACU for monitor reading Afib- same issue in preop, pt has sinus arrhythmia w/ bifid p wave before each beat and long PR interval   No notable events documented.  Last Vitals:  Vitals:   07/01/21 1015 07/01/21 1030  BP: 116/64 119/68  Pulse: 76 74  Resp: 17 14  Temp:    SpO2: 95% 96%    Last Pain:  Vitals:   07/01/21 1030  TempSrc:   PainSc: [name]

## 2021-07-01 NOTE — H&P (Signed)
Robert Dickerson is an 80 y.o. male.   Chief Complaint: Ulceration right leg status postsurgical debridement with new ulceration and nonhealing. HPI: Patient is a 80 year old gentleman who presents in follow-up for traumatic wound right leg status post myriad skin graft on 05/20/2021.  Patient has been seen by Dr. Unk Lightning and stated that he had adequate macro circulation.  Patient has progressive ulceration anterior laterally as well as nonhealing of the current wound bed.  Past Medical History:  Diagnosis Date   Arthritis    all joints   Chronic diastolic CHF (congestive heart failure) (HCC)    Chronic kidney disease (CKD), stage IV (severe) (HCC)    Chronic kidney disease, stage 3 (HCC)    Congestive heart failure with LV diastolic dysfunction, NYHA class 2 ([place])    Family history of adverse reaction to anesthesia    father- change in personaility   GERD (gastroesophageal reflux disease)    Hypertension    Hypertension associated with diabetes ([place])    Hypertensive chronic kidney disease    Insulin dependent type 2 diabetes mellitus (HCC)    Neuropathy    OSA on CPAP    Sleep apnea    uses cpap, pt does not know settings    Past Surgical History:  Procedure Laterality Date   ANTERIOR LAT LUMBAR FUSION Left 01/22/2021   Procedure: Lumbar one-Lumbar two Lateral Lumbar Interbody Fusion;  Surgeon: Vallarie Mare, MD;  Location: [facility];  Service: Neurosurgery;  Laterality: Left;   ANTERIOR LAT LUMBAR FUSION  01/22/2021   APPENDECTOMY     BACK SURGERY     4 surg, lower   CHOLECYSTECTOMY     COLONOSCOPY WITH PROPOFOL N/A 06/11/2014   Procedure: COLONOSCOPY WITH PROPOFOL;  Surgeon: Garlan Fair, MD;  Location: [facility];  Service: Endoscopy;  Laterality: N/A;   ESOPHAGOGASTRODUODENOSCOPY  08/26/2011   Procedure: ESOPHAGOGASTRODUODENOSCOPY (EGD);  Surgeon: Garlan Fair, MD;  Location: [facility];  Service: Endoscopy;  Laterality: N/A;   ESOPHAGOGASTRODUODENOSCOPY (EGD) WITH  PROPOFOL N/A 06/11/2014   Procedure: ESOPHAGOGASTRODUODENOSCOPY (EGD) WITH PROPOFOL;  Surgeon: Garlan Fair, MD;  Location: [facility];  Service: Endoscopy;  Laterality: N/A;   EUS  09/15/2011   Procedure: UPPER ENDOSCOPIC ULTRASOUND (EUS) LINEAR;  Surgeon: Landry Dyke, MD;  Location: [facility];  Service: Endoscopy;  Laterality: N/A;  MAC   I & D EXTREMITY Right 05/20/2021   Procedure: IRRIGATION AND DEBRIDEMENT OF LEG AND APPLICATION OF SKIN GRAFT;  Surgeon: Newt Minion, MD;  Location: [place];  Service: Orthopedics;  Laterality: Right;   KNEE ARTHROSCOPY Right YEARS AGO    Family History  Problem Relation Age of Onset   Hypertension Mother    Anesthesia problems Neg Hx    Hypotension Neg Hx    Malignant hyperthermia Neg Hx    Pseudochol deficiency Neg Hx    Social History:  reports that he quit smoking about 38 years ago. His smoking use included cigarettes. He has a 40.00 pack-year smoking history. He has never used smokeless tobacco. He reports that he does not currently use alcohol. He reports that he does not use drugs.  Allergies:  Allergies  Allergen Reactions   Ace Inhibitors Cough   Lisinopril Cough   Oxycodone Other (See Comments)    "out of it"     Medications Prior to Admission  Medication Sig Dispense Refill   allopurinol (ZYLOPRIM) 300 MG tablet Take 300 mg by mouth at bedtime.     amLODipine (NORVASC)  10 MG tablet Take 10 mg by mouth daily.     econazole nitrate 1 % cream Apply 1 application topically daily as needed [facility]).     furosemide (LASIX) 80 MG tablet Take 160 mg by mouth daily.     HYDROcodone-acetaminophen (NORCO/VICODIN) 5-325 MG tablet Take 1 tablet by mouth every 6 (six) hours as needed for moderate pain ((score 4 to 6)). 30 tablet 0   hydrOXYzine (VISTARIL) 25 MG capsule Take 25 mg by mouth at bedtime.     LANTUS SOLOSTAR 100 UNIT/ML Solostar Pen Inject 8 Units into the skin in the morning.     metolazone (ZAROXOLYN) 5 MG tablet  Take 5 mg by mouth in the morning.     potassium chloride SA (KLOR-CON) 20 MEQ tablet Take 20 mEq by mouth daily.     predniSONE (DELTASONE) 5 MG tablet Take 5 mg by mouth daily with breakfast.     pregabalin (LYRICA) 75 MG capsule Take 75 mg by mouth 2 (two) times daily.     ACCU-CHEK GUIDE test strip USE TO CHECK BLOOD SUGAR AS DIRECTED UP TO FOUR TIMES DAILY     Accu-Chek Softclix Lancets lancets SMARTSIG:Topical 1-4 Times Daily     blood glucose meter kit and supplies KIT Dispense based on patient and insurance preference. Use up to four times daily as directed. 1 each 0   blood glucose meter kit and supplies by Other route as directed. Check blood sugar every morning     ciprofloxacin (CIPRO) 500 MG tablet Take 1 tablet (500 mg total) by mouth 2 (two) times daily. (Patient not taking: Reported on 06/29/2021) 14 tablet 0   cyclobenzaprine (FLEXERIL) 10 MG tablet Take 1 tablet (10 mg total) by mouth 3 (three) times daily as needed for muscle spasms. (Patient not taking: Reported on 06/29/2021) 50 tablet 0   docusate sodium (COLACE) 100 MG capsule Take 1 capsule (100 mg total) by mouth 2 (two) times daily. (Patient not taking: Reported on 06/29/2021) 60 capsule 2   silver sulfADIAZINE (SILVADENE) 1 % cream Apply 1 application topically daily. Apply to affected area daily plus dry dressing (Patient not taking: Reported on 06/29/2021) 400 g 3    No results found for this or any previous visit (from the past 48 hour(s)). No results found.  Review of Systems  All other systems reviewed and are negative.  Blood pressure (!) 147/68, pulse 88, temperature 97.7 F (36.5 C), temperature source Oral, resp. rate 18, height _0  (1.778 m), weight 99.8 kg, SpO2 98 %. Physical Exam  Patient is alert, oriented, no adenopathy, well-dressed, normal affect, normal respiratory effort. Examination patient has a new wound anterior laterally with nonviable tissue in the wound which measures 2 x 4 cm.  The  previous traumatic wound has hypergranulation tissue with approximately 50% fibrinous exudative tissue 50% hypergranulation tissue in the wound is 11 x 11 cm.  There is brawny skin color edema with pitting edema in the entire right lower extremity.  No cellulitis no odor no drainage.  Assessment/Plan Assessment: Traumatic wound right leg with progressive wound breakdown and new ulceration laterally.  Plan: We will plan for repeat debridement placement of installation wound VAC and return to the operating room on Friday for evaluation for skin graft.  Risk and benefits were discussed including the need for additional surgery.  Patient states he understands wished to proceed at this time.  Newt Minion, MD 07/01/2021, 6:36 AM

## 2021-07-01 NOTE — Anesthesia Procedure Notes (Signed)
Anesthesia Regional Block: Popliteal block   Pre-Anesthetic Checklist: , timeout performed,  Correct Patient, Correct Site, Correct Laterality,  Correct Procedure, Correct Position, site marked,  Risks and benefits discussed,  Surgical consent,  Pre-op evaluation,  At surgeon's request and post-op pain management  Laterality: Right  Prep: Maximum Sterile Barrier Precautions used, chloraprep       Needles:  Injection technique: Single-shot  Needle Type: Echogenic Stimulator Needle     Needle Length: 9cm  Needle Gauge: 22     Additional Needles:   Procedures:,,,, ultrasound used (permanent image in chart),,    Narrative:  Start time: 07/01/2021 7:50 AM End time: 07/01/2021 7:55 AM Injection made incrementally with aspirations every 5 mL.  Performed by: Personally  Anesthesiologist: Pervis Hocking, DO  Additional Notes: Monitors applied. No increased pain on injection. No increased resistance to injection. Injection made in 5cc increments. Good needle visualization. Patient tolerated procedure well.

## 2021-07-02 ENCOUNTER — Encounter (HOSPITAL_COMMUNITY): Payer: Self-pay | Admitting: Orthopedic Surgery

## 2021-07-02 LAB — GLUCOSE, CAPILLARY
Glucose-Capillary: 184 mg/dL — ABNORMAL HIGH (ref 70–99)
Glucose-Capillary: 166 mg/dL — ABNORMAL HIGH (ref 70–99)
Glucose-Capillary: 137 mg/dL — ABNORMAL HIGH (ref 70–99)
Glucose-Capillary: 292 mg/dL — ABNORMAL HIGH (ref 70–99)

## 2021-07-02 NOTE — Evaluation (Addendum)
Physical Therapy Evaluation Patient Details Name: Robert Dickerson MRN: [id number] DOB: 1940-10-26 Today's Date: 07/03/2021  History of Present Illness  80 yo male s/p RLE excisional debridement of skin, soft tissue, muscle, and fascia; application of wound vac on 11/30. Plan for return to OR on 12/2 for skin graft. PMH includes OA, HF, CKD IV, HF, HTN, DM with neuropathy, lumbar fusion 12/2020.  Clinical Impression   Pt presents with mild RLE post-operative pain, increased time and effort to mobilize, and decreased activity tolerance vs baseline. Pt to benefit from acute PT to address deficits. Pt ambulated good hallway distance with use of RW and supervision for safety, anticipate no immediate PT services needed, may benefit from OPPT at a later follow up with ortho. PT to progress mobility as tolerated, and will continue to follow acutely.         Recommendations for follow up therapy are one component of a multi-disciplinary discharge planning process, led by the attending physician.  Recommendations may be updated based on patient status, additional functional criteria and insurance authorization.  Follow Up Recommendations Follow physician's recommendations for discharge plan and follow up therapies    Assistance Recommended at Discharge Set up Supervision/Assistance  Functional Status Assessment    Equipment Recommendations  None recommended by PT    Recommendations for Other Services       Precautions / Restrictions Precautions Precautions: Fall Restrictions Weight Bearing Restrictions: No RLE Weight Bearing: Weight bearing as tolerated      Mobility  Bed Mobility Overal bed mobility: Modified Independent Bed Mobility: Supine to Sit;Sit to Supine                Transfers Overall transfer level: Modified independent Equipment used: Rolling walker (2 wheels) Transfers: Sit to/from Stand Sit to Stand: Modified independent (Device/Increase time)            General transfer comment: no physical assist    Ambulation/Gait Ambulation/Gait assistance: Modified independent (Device/Increase time) Gait Distance (Feet): 250 Feet Assistive device: Rolling walker (2 wheels) Gait Pattern/deviations: Step-through pattern Gait velocity: decr     General Gait Details: mod I for increased time, pt reporting pain but no antalgic gait  Stairs Stairs:  (reviewed stair technique, up with "good" leg and down with "bad" leg)          Wheelchair Mobility    Modified Rankin (Stroke Patients Only)       Balance Overall balance assessment: Mild deficits observed, not formally tested                                           Pertinent Vitals/Pain Pain Assessment: 0-10 Pain Score: 5  Pain Location: RLE Pain Descriptors / Indicators: Sore Pain Intervention(s): Monitored during session;Limited activity within patient's tolerance;Repositioned    Home Living                          Prior Function                       Hand Dominance        Extremity/Trunk Assessment                Communication      Cognition Arousal/Alertness: Awake/alert Behavior During Therapy: WFL for tasks assessed/performed Overall Cognitive Status: Within Functional Limits for tasks assessed  General Comments      Exercises     Assessment/Plan    PT Assessment    PT Problem List         PT Treatment Interventions      PT Goals (Current goals can be found in the Care Plan section)  Acute Rehab PT Goals PT Goal Formulation: With patient Time For Goal Achievement: 07/16/21 Potential to Achieve Goals: Good    Frequency Min 3X/week   Barriers to discharge        Co-evaluation               AM-PAC PT "6 Clicks" Mobility  Outcome Measure Help needed turning from your back to your side while in a flat bed without using bedrails?:  None Help needed moving from lying on your back to sitting on the side of a flat bed without using bedrails?: None Help needed moving to and from a bed to a chair (including a wheelchair)?: None Help needed standing up from a chair using your arms (e.g., wheelchair or bedside chair)?: None Help needed to walk in hospital room?: None Help needed climbing 3-5 steps with a railing? : A Little 6 Click Score: 23    End of Session   Activity Tolerance: Patient tolerated treatment well Patient left: in chair;with call bell/phone within reach;with family/visitor present;Other (comment) (pt verbalizes he will press call button and wait for assist prior to mobilizing out of chair) Nurse Communication: Mobility status PT Visit Diagnosis: Other abnormalities of gait and mobility (R26.89);Pain Pain - Right/Left: Right Pain - part of body: Leg    Time: 9163-8466 PT Time Calculation (min) (ACUTE ONLY): 15 min   Charges:     PT Treatments $Gait Training: 8-22 mins       Stacie Glaze, PT DPT Acute Rehabilitation Services Pager [phone]  Office [phone]   Louis Matte 07/03/2021, 4:49 PM

## 2021-07-02 NOTE — Progress Notes (Signed)
Patient ID: Robert Dickerson, male   DOB: [birth year], 80 y.o.   MRN: [id number] Patient is postoperative day 1 debridement ulcerations right lower extremity.  There is 250 cc in the wound VAC canister this is a good suction for.  Plan for return to the operating room tomorrow Friday for application of split-thickness skin graft.  Anticipate discharge to home once there is minimal drainage and patient can be discharged with the portable Praveena wound VAC pump.

## 2021-07-02 NOTE — H&P (View-Only) (Signed)
Patient ID: Robert Dickerson, male   DOB: 22-Jan-1941, 80 y.o.   MRN: [id number] Patient is postoperative day 1 debridement ulcerations right lower extremity.  There is 250 cc in the wound VAC canister this is a good suction for.  Plan for return to the operating room tomorrow Friday for application of split-thickness skin graft.  Anticipate discharge to home once there is minimal drainage and patient can be discharged with the portable Praveena wound VAC pump.

## 2021-07-03 ENCOUNTER — Inpatient Hospital Stay (HOSPITAL_COMMUNITY): Payer: Medicare Other | Admitting: Anesthesiology

## 2021-07-03 ENCOUNTER — Encounter (HOSPITAL_COMMUNITY)
Admission: RE | Disposition: A | Discharge status: Home / Self Care | Payer: Self-pay | Source: Home / Self Care | Attending: Orthopedic Surgery

## 2021-07-03 ENCOUNTER — Encounter (HOSPITAL_COMMUNITY): Payer: Self-pay | Admitting: Orthopedic Surgery

## 2021-07-03 DIAGNOSIS — S81801S Unspecified open wound, right lower leg, sequela: Secondary | ICD-10-CM | POA: Diagnosis not present

## 2021-07-03 DIAGNOSIS — M79661 Pain in right lower leg: Secondary | ICD-10-CM

## 2021-07-03 HISTORY — PX: I & D EXTREMITY: SHX5045

## 2021-07-03 LAB — GLUCOSE, CAPILLARY
Glucose-Capillary: 119 mg/dL — ABNORMAL HIGH (ref 70–99)
Glucose-Capillary: 126 mg/dL — ABNORMAL HIGH (ref 70–99)
Glucose-Capillary: 143 mg/dL — ABNORMAL HIGH (ref 70–99)
Glucose-Capillary: 151 mg/dL — ABNORMAL HIGH (ref 70–99)
Glucose-Capillary: 151 mg/dL — ABNORMAL HIGH (ref 70–99)
Glucose-Capillary: 155 mg/dL — ABNORMAL HIGH (ref 70–99)

## 2021-07-03 SURGERY — IRRIGATION AND DEBRIDEMENT EXTREMITY
Anesthesia: General | Laterality: Right

## 2021-07-03 MED ORDER — ACETAMINOPHEN 500 MG PO TABS
ORAL_TABLET | ORAL | Status: AC
  Administered 2021-07-03: 1000 mg via ORAL
  Filled 2021-07-03: qty 2

## 2021-07-03 MED ORDER — PHENYLEPHRINE 40 MCG/ML (10ML) SYRINGE FOR IV PUSH (FOR BLOOD PRESSURE SUPPORT)
PREFILLED_SYRINGE | INTRAVENOUS | Status: AC
  Filled 2021-07-03: qty 10

## 2021-07-03 MED ORDER — PROPOFOL 10 MG/ML IV BOLUS
INTRAVENOUS | Status: DC | PRN
  Administered 2021-07-03: 170 mg via INTRAVENOUS

## 2021-07-03 MED ORDER — CEFAZOLIN SODIUM-DEXTROSE 2-4 GM/100ML-% IV SOLN
2.0000 g | INTRAVENOUS | Status: DC
  Filled 2021-07-03: qty 100

## 2021-07-03 MED ORDER — FENTANYL CITRATE (PF) 250 MCG/5ML IJ SOLN
INTRAMUSCULAR | Status: AC
  Filled 2021-07-03: qty 5

## 2021-07-03 MED ORDER — CHLORHEXIDINE GLUCONATE 4 % EX LIQD: 60.0000 mL | Freq: Once | CUTANEOUS | Status: DC

## 2021-07-03 MED ORDER — FENTANYL CITRATE (PF) 250 MCG/5ML IJ SOLN
INTRAMUSCULAR | Status: DC | PRN
  Administered 2021-07-03: 60 ug via INTRAVENOUS
  Administered 2021-07-03: 50 ug via INTRAVENOUS

## 2021-07-03 MED ORDER — CHLORHEXIDINE GLUCONATE 0.12 % MT SOLN: 15.0000 mL | Freq: Once | OROMUCOSAL | Status: AC

## 2021-07-03 MED ORDER — CEFAZOLIN SODIUM-DEXTROSE 2-3 GM-%(50ML) IV SOLR
INTRAVENOUS | Status: DC | PRN
  Administered 2021-07-03: 2 g via INTRAVENOUS

## 2021-07-03 MED ORDER — LIDOCAINE 2% (20 MG/ML) 5 ML SYRINGE
INTRAMUSCULAR | Status: AC
  Filled 2021-07-03: qty 20

## 2021-07-03 MED ORDER — ORAL CARE MOUTH RINSE: 15.0000 mL | Freq: Once | OROMUCOSAL | Status: AC

## 2021-07-03 MED ORDER — POVIDONE-IODINE 10 % EX SWAB: 2.0000 "application " | Freq: Once | CUTANEOUS | Status: DC

## 2021-07-03 MED ORDER — 0.9 % SODIUM CHLORIDE (POUR BTL) OPTIME
TOPICAL | Status: DC | PRN
  Administered 2021-07-03: 1000 mL

## 2021-07-03 MED ORDER — SUCCINYLCHOLINE CHLORIDE 200 MG/10ML IV SOSY
PREFILLED_SYRINGE | INTRAVENOUS | Status: AC
  Filled 2021-07-03: qty 10

## 2021-07-03 MED ORDER — SODIUM CHLORIDE 0.9 % IV SOLN: INTRAVENOUS | Status: DC

## 2021-07-03 MED ORDER — ACETAMINOPHEN 500 MG PO TABS: 1000.0000 mg | ORAL_TABLET | Freq: Once | ORAL | Status: AC

## 2021-07-03 MED ORDER — ONDANSETRON HCL 4 MG/2ML IJ SOLN
INTRAMUSCULAR | Status: DC | PRN
  Administered 2021-07-03: 4 mg via INTRAVENOUS

## 2021-07-03 MED ORDER — CEFAZOLIN SODIUM-DEXTROSE 2-4 GM/100ML-% IV SOLN: 2.0000 g | INTRAVENOUS | Status: DC

## 2021-07-03 MED ORDER — PROPOFOL 10 MG/ML IV BOLUS
INTRAVENOUS | Status: AC
  Filled 2021-07-03: qty 20

## 2021-07-03 MED ORDER — FENTANYL CITRATE (PF) 100 MCG/2ML IJ SOLN
25.0000 ug | INTRAMUSCULAR | Status: DC | PRN
  Administered 2021-07-03 (×3): 50 ug via INTRAVENOUS

## 2021-07-03 MED ORDER — ONDANSETRON HCL 4 MG/2ML IJ SOLN
INTRAMUSCULAR | Status: AC
  Filled 2021-07-03: qty 10

## 2021-07-03 MED ORDER — DEXAMETHASONE SODIUM PHOSPHATE 10 MG/ML IJ SOLN
INTRAMUSCULAR | Status: AC
  Filled 2021-07-03: qty 3

## 2021-07-03 MED ORDER — ONDANSETRON HCL 4 MG/2ML IJ SOLN: 4.0000 mg | Freq: Once | INTRAMUSCULAR | Status: DC | PRN

## 2021-07-03 MED ORDER — FENTANYL CITRATE (PF) 100 MCG/2ML IJ SOLN
INTRAMUSCULAR | Status: AC
  Filled 2021-07-03: qty 2

## 2021-07-03 MED ORDER — POVIDONE-IODINE 10 % EX SWAB
2.0000 "application " | Freq: Once | CUTANEOUS | Status: AC
  Administered 2021-07-03: 2 via TOPICAL

## 2021-07-03 MED ORDER — ROCURONIUM BROMIDE 10 MG/ML (PF) SYRINGE
PREFILLED_SYRINGE | INTRAVENOUS | Status: AC
  Filled 2021-07-03: qty 20

## 2021-07-03 MED ORDER — CHLORHEXIDINE GLUCONATE 0.12 % MT SOLN
OROMUCOSAL | Status: AC
  Administered 2021-07-03: 15 mL via OROMUCOSAL
  Filled 2021-07-03: qty 15

## 2021-07-03 SURGICAL SUPPLY — 38 items
BAG COUNTER SPONGE SURGICOUNT (BAG) IMPLANT
BAG SPNG CNTER NS LX DISP (BAG)
BLADE SURG 21 STRL SS (BLADE) ×2 IMPLANT
BNDG COHESIVE 6X5 TAN NS LF (GAUZE/BANDAGES/DRESSINGS) ×1 IMPLANT
BNDG COHESIVE 6X5 TAN STRL LF (GAUZE/BANDAGES/DRESSINGS) IMPLANT
BNDG GAUZE ELAST 4 BULKY (GAUZE/BANDAGES/DRESSINGS) ×4 IMPLANT
CASSETTE VERAFLO VERALINK (MISCELLANEOUS) ×1 IMPLANT
COVER SURGICAL LIGHT HANDLE (MISCELLANEOUS) ×4 IMPLANT
DRAPE U-SHAPE 47X51 STRL (DRAPES) ×2 IMPLANT
DRSG ADAPTIC 3X8 NADH LF (GAUZE/BANDAGES/DRESSINGS) ×2 IMPLANT
DRSG VERAFLO VAC MED (GAUZE/BANDAGES/DRESSINGS) ×1 IMPLANT
DURAPREP 26ML APPLICATOR (WOUND CARE) ×2 IMPLANT
ELECT REM PT RETURN 9FT ADLT (ELECTROSURGICAL)
ELECTRODE REM PT RTRN 9FT ADLT (ELECTROSURGICAL) IMPLANT
GAUZE SPONGE 4X4 12PLY STRL (GAUZE/BANDAGES/DRESSINGS) ×2 IMPLANT
GLOVE SURG ORTHO LTX SZ9 (GLOVE) ×2 IMPLANT
GLOVE SURG UNDER POLY LF SZ9 (GLOVE) ×2 IMPLANT
GOWN STRL REUS W/ TWL XL LVL3 (GOWN DISPOSABLE) ×2 IMPLANT
GOWN STRL REUS W/TWL XL LVL3 (GOWN DISPOSABLE) ×4
GRAFT SKIN WND OMEGA3 3X3.5 (Tissue) ×1 IMPLANT
GRAFT SKIN WND OMEGA3 7X10 (Tissue) ×2 IMPLANT
GRAFT SKN 7X10XSTRL LF DISP (Tissue) IMPLANT
HANDPIECE INTERPULSE COAX TIP (DISPOSABLE)
KIT BASIN OR (CUSTOM PROCEDURE TRAY) ×2 IMPLANT
KIT DRSG PREVENA PLUS 7DAY 125 (MISCELLANEOUS) ×1 IMPLANT
KIT TURNOVER KIT B (KITS) ×2 IMPLANT
MANIFOLD NEPTUNE II (INSTRUMENTS) ×2 IMPLANT
NS IRRIG 1000ML POUR BTL (IV SOLUTION) ×2 IMPLANT
PACK ORTHO EXTREMITY (CUSTOM PROCEDURE TRAY) ×2 IMPLANT
PAD ARMBOARD 7.5X6 YLW CONV (MISCELLANEOUS) ×4 IMPLANT
SET HNDPC FAN SPRY TIP SCT (DISPOSABLE) IMPLANT
STOCKINETTE IMPERVIOUS 9X36 MD (GAUZE/BANDAGES/DRESSINGS) IMPLANT
SUT ETHILON 2 0 PSLX (SUTURE) ×2 IMPLANT
SWAB COLLECTION DEVICE MRSA (MISCELLANEOUS) ×2 IMPLANT
SWAB CULTURE ESWAB REG 1ML (MISCELLANEOUS) IMPLANT
TOWEL GREEN STERILE (TOWEL DISPOSABLE) ×2 IMPLANT
TUBE CONNECTING 12X1/4 (SUCTIONS) ×2 IMPLANT
YANKAUER SUCT BULB TIP NO VENT (SUCTIONS) ×2 IMPLANT

## 2021-07-03 NOTE — Op Note (Signed)
07/03/2021  9:09 AM  PATIENT:  [name]    PRE-OPERATIVE DIAGNOSIS:  Right Leg Ulcer  POST-OPERATIVE DIAGNOSIS:  Same  PROCEDURE:  REPEAT RIGHT LEG DEBRIDEMENT  SURGEON:  Newt Minion, MD  PHYSICIAN ASSISTANT:None ANESTHESIA:   General  PREOPERATIVE INDICATIONS:  Robert Dickerson is a  80 y.o. male with a diagnosis of Right Leg Ulcer who failed conservative measures and elected for surgical management.    The risks benefits and alternatives were discussed with the patient preoperatively including but not limited to the risks of infection, bleeding, nerve injury, cardiopulmonary complications, the need for revision surgery, among others, and the patient was willing to proceed.  OPERATIVE IMPLANTS: Kerecis split-thickness skin graft times two 1 unit 7 x 10 cm the other 5 x 3 cm  @ENCIMAGES @  OPERATIVE FINDINGS: Good healthy bleeding granulation tissue  OPERATIVE PROCEDURE: Patient brought the operating room and underwent a general anesthetic.  After adequate levels anesthesia were obtained patient's right lower extremity was prepped using Betadine paint over the wound the remainder of the extremity was prepped using DuraPrep draped into a sterile field a timeout was called.  Patient underwent debridement of the 2 wounds 7 x 10 5 x 3 cm.  There was healthy granulation tissue electrocardio is used for hemostasis.  The 2 wounds were covered with Kerecis graft 7 x 10 cm and 3 x 5 cm.  This was secured with staples this was covered with a cleanse choice wound VAC sponge secured with derma tack Ioban and Covan.  Patient was extubated taken the PACU in stable condition.  Debridement type: Excisional Debridement  Side: right  Body Location: right leg   Tools used for debridement: scalpel and rongeur  Pre-debridement Wound size (cm):   Length: 7        Width: 10     Depth: 1   Post-debridement Wound size (cm):   Length: 7        Width: 10     Depth: 1   Debridement depth beyond  dead/damaged tissue down to healthy viable tissue: yes  Tissue layer involved: skin, subcutaneous tissue  Nature of tissue removed: Non-viable tissue  Irrigation volume: 1 liter     Irrigation fluid type: Normal Saline    DISCHARGE PLANNING:  Antibiotic duration: Continue antibiotics for 24 hours  Weightbearing: Weightbearing as tolerated  Pain medication: Continue opioid pathway  Dressing care/ Wound VAC: Continue wound VAC for 1 week  Ambulatory devices: Walker or crutches  Discharge to: Anticipate discharge to home tomorrow.  Follow-up: In the office 1 week post operative.

## 2021-07-03 NOTE — Progress Notes (Signed)
Pt refusing CPAP tonight. Requested machine be removed from room as its not compatible with his mask, and he should be discharged in the morning. Machine removed at this time. Advised pt to notify for RT should he change his mind.

## 2021-07-03 NOTE — Anesthesia Preprocedure Evaluation (Addendum)
Anesthesia Evaluation  Patient identified by MRN, date of birth, ID band Patient awake    Reviewed: Allergy & Precautions, NPO status , Patient's Chart, lab work & pertinent test results  History of Anesthesia Complications Negative for: history of anesthetic complications  Airway Mallampati: II  TM Distance: >3 FB Neck ROM: Full    Dental  (+) Dental Advisory Given   Pulmonary sleep apnea and Continuous Positive Airway Pressure Ventilation , former smoker,    Pulmonary exam normal        Cardiovascular hypertension, Pt. on medications Normal cardiovascular exam     Neuro/Psych negative neurological ROS  negative psych ROS   GI/Hepatic Neg liver ROS, GERD  Controlled,  Endo/Other  diabetes, Type 2, Insulin Dependent Obesity   Renal/GU CRFRenal disease     Musculoskeletal  (+) Arthritis , Osteoarthritis,   Gout    Abdominal   Peds  Hematology negative hematology ROS (+)   Anesthesia Other Findings   Reproductive/Obstetrics                            Anesthesia Physical Anesthesia Plan  ASA: 3  Anesthesia Plan: General   Post-op Pain Management:    Induction: Intravenous  PONV Risk Score and Plan: 2 and Treatment may vary due to age or medical condition and Ondansetron  Airway Management Planned: LMA  Additional Equipment: None  Intra-op Plan:   Post-operative Plan: Extubation in OR  Informed Consent: I have reviewed the patients History and Physical, chart, labs and discussed the procedure including the risks, benefits and alternatives for the proposed anesthesia with the patient or authorized representative who has indicated his/her understanding and acceptance.     Dental advisory given  Plan Discussed with: CRNA and Anesthesiologist  Anesthesia Plan Comments:        Anesthesia Quick Evaluation

## 2021-07-03 NOTE — Care Management Important Message (Signed)
Important Message  Patient Details  Name: Robert Dickerson MRN: [id number] Date of Birth: [birth year]   Medicare Important Message Given:  Yes     Hannah Beat 07/03/2021, 1:24 PM

## 2021-07-03 NOTE — Transfer of Care (Signed)
Immediate Anesthesia Transfer of Care Note  Patient: Robert Dickerson  Procedure(s) Performed: REPEAT RIGHT LEG DEBRIDEMENT (Right)  Patient Location: PACU  Anesthesia Type:General  Level of Consciousness: awake, alert  and oriented  Airway & Oxygen Therapy: Patient Spontanous Breathing  Post-op Assessment: Report given to RN and Post -op Vital signs reviewed and stable  Post vital signs: Reviewed and stable  Last Vitals:  Vitals Value Taken Time  BP 112/85 07/03/21 0907  Temp    Pulse 73 07/03/21 0907  Resp 11 07/03/21 0907  SpO2 99 % 07/03/21 0907  Vitals shown include unvalidated device data.  Last Pain:  Vitals:   07/03/21 0825  TempSrc: Oral  PainSc: 4       Patients Stated Pain Goal: 3 (49/44/96 7591)  Complications: No notable events documented.

## 2021-07-03 NOTE — Progress Notes (Signed)
Physical Therapy Treatment Patient Details Name: Robert Dickerson MRN: [id number] DOB: 28-Jun-1941 Today's Date: 07/03/2021   History of Present Illness 80 yo male s/p RLE excisional debridement of skin, soft tissue, muscle, and fascia; application of wound vac on 11/30. Plan for return to OR on 12/2 for skin graft. PMH includes OA, HF, CKD IV, HF, HTN, DM with neuropathy, lumbar fusion 12/2020.    PT Comments    Pt demonstrating mod Independent level of mobility at this time, only limiting factor is pain. Pt ambulatory for great hallway distance with use of RW, understands how to navigate steps, and will have wife to assist with any needs at home. PT to sign off, all acute PT goals met and appropriate to d/c from a mobility standpoint.     Recommendations for follow up therapy are one component of a multi-disciplinary discharge planning process, led by the attending physician.  Recommendations may be updated based on patient status, additional functional criteria and insurance authorization.  Follow Up Recommendations  Follow physician's recommendations for discharge plan and follow up therapies     Assistance Recommended at Discharge Set up Supervision/Assistance  Equipment Recommendations  None recommended by PT    Recommendations for Other Services       Precautions / Restrictions Precautions Precautions: Fall Restrictions Weight Bearing Restrictions: No RLE Weight Bearing: Weight bearing as tolerated     Mobility  Bed Mobility Overal bed mobility: Modified Independent Bed Mobility: Supine to Sit;Sit to Supine                Transfers Overall transfer level: Modified independent Equipment used: Rolling walker (2 wheels) Transfers: Sit to/from Stand Sit to Stand: Modified independent (Device/Increase time)           General transfer comment: no physical assist    Ambulation/Gait Ambulation/Gait assistance: Modified independent (Device/Increase time) Gait  Distance (Feet): 250 Feet Assistive device: Rolling walker (2 wheels) Gait Pattern/deviations: Step-through pattern Gait velocity: decr     General Gait Details: mod I for increased time, pt reporting pain but no antalgic gait   Stairs Stairs:  (reviewed stair technique, up with "good" leg and down with "bad" leg)           Wheelchair Mobility    Modified Rankin (Stroke Patients Only)       Balance Overall balance assessment: Mild deficits observed, not formally tested                                          Cognition Arousal/Alertness: Awake/alert Behavior During Therapy: WFL for tasks assessed/performed Overall Cognitive Status: Within Functional Limits for tasks assessed                                          Exercises      General Comments        Pertinent Vitals/Pain Pain Assessment: 0-10 Pain Score: 5  Pain Location: RLE Pain Descriptors / Indicators: Sore Pain Intervention(s): Monitored during session;Limited activity within patient's tolerance;Repositioned    Home Living                          Prior Function            PT Goals (current goals can  now be found in the care plan section) Acute Rehab PT Goals PT Goal Formulation: With patient Time For Goal Achievement: 07/16/21 Potential to Achieve Goals: Good Progress towards PT goals: Goals met/education completed, patient discharged from PT    Frequency    Min 3X/week      PT Plan Current plan remains appropriate    Co-evaluation              AM-PAC PT "6 Clicks" Mobility   Outcome Measure  Help needed turning from your back to your side while in a flat bed without using bedrails?: None Help needed moving from lying on your back to sitting on the side of a flat bed without using bedrails?: None Help needed moving to and from a bed to a chair (including a wheelchair)?: None Help needed standing up from a chair using your arms  (e.g., wheelchair or bedside chair)?: None Help needed to walk in hospital room?: None Help needed climbing 3-5 steps with a railing? : A Little 6 Click Score: 23    End of Session   Activity Tolerance: Patient tolerated treatment well Patient left: in chair;with call bell/phone within reach;with family/visitor present;Other (comment) (pt verbalizes he will press call button and wait for assist prior to mobilizing out of chair) Nurse Communication: Mobility status PT Visit Diagnosis: Other abnormalities of gait and mobility (R26.89);Pain Pain - Right/Left: Right Pain - part of body: Leg     Time: 9437-0052 PT Time Calculation (min) (ACUTE ONLY): 15 min  Charges:  $Therapeutic Activity: 8-22 mins                    Stacie Glaze, PT DPT Acute Rehabilitation Services Pager [phone]  Office [phone]    Louis Matte 07/03/2021, 4:46 PM

## 2021-07-03 NOTE — Interval H&P Note (Signed)
History and Physical Interval Note:  07/03/2021 6:49 AM  [name]  has presented today for surgery, with the diagnosis of Right Leg Ulcer.  The various methods of treatment have been discussed with the patient and family. After consideration of risks, benefits and other options for treatment, the patient has consented to  Procedure(s): REPEAT RIGHT LEG DEBRIDEMENT (Right) as a surgical intervention.  The patient's history has been reviewed, patient examined, no change in status, stable for surgery.  I have reviewed the patient's chart and labs.  Questions were answered to the patient's satisfaction.     Newt Minion

## 2021-07-03 NOTE — Anesthesia Procedure Notes (Signed)
Procedure Name: LMA Insertion Date/Time: 07/03/2021 8:44 AM Performed by: Clearnce Sorrel, CRNA Pre-anesthesia Checklist: Patient identified, Emergency Drugs available, Suction available and Patient being monitored Patient Re-evaluated:Patient Re-evaluated prior to induction Oxygen Delivery Method: Circle System Utilized Preoxygenation: Pre-oxygenation with 100% oxygen Induction Type: IV induction Ventilation: Mask ventilation without difficulty LMA: LMA inserted LMA Size: 4.0 Number of attempts: 1 Airway Equipment and Method: Bite block Placement Confirmation: positive ETCO2 Tube secured with: Tape Dental Injury: Teeth and Oropharynx as per pre-operative assessment

## 2021-07-03 NOTE — Anesthesia Postprocedure Evaluation (Signed)
Anesthesia Post Note  Patient: Robert Dickerson  Procedure(s) Performed: REPEAT RIGHT LEG DEBRIDEMENT (Right)     Patient location during evaluation: PACU Anesthesia Type: General Level of consciousness: awake and alert Pain management: pain level controlled Vital Signs Assessment: post-procedure vital signs reviewed and stable Respiratory status: spontaneous breathing, nonlabored ventilation and respiratory function stable Cardiovascular status: stable and blood pressure returned to baseline Anesthetic complications: no   No notable events documented.  Last Vitals:  Vitals:   07/03/21 1009 07/03/21 1159  BP: 124/63 (!) 158/71  Pulse: 77 87  Resp: 16 15  Temp: 36.5 C 36.8 C  SpO2: 98% 98%    Last Pain:  Vitals:   07/03/21 1202  TempSrc:   PainSc: 0-No pain                 Audry Pili

## 2021-07-04 LAB — GLUCOSE, CAPILLARY
Glucose-Capillary: 104 mg/dL — ABNORMAL HIGH (ref 70–99)
Glucose-Capillary: 99 mg/dL (ref 70–99)

## 2021-07-04 NOTE — Plan of Care (Signed)
  Problem: Education: Goal: Knowledge of General Education information will improve Description: Including pain rating scale, medication(s)/side effects and non-pharmacologic comfort measures Outcome: Progressing   Problem: Health Behavior/Discharge Planning: Goal: Ability to manage health-related needs will improve Outcome: Progressing   Problem: Clinical Measurements: Goal: Ability to maintain clinical measurements within normal limits will improve Outcome: Progressing Goal: Will remain free from infection Outcome: Progressing Goal: Diagnostic test results will improve Outcome: Progressing Goal: Respiratory complications will improve Outcome: Progressing Goal: Cardiovascular complication will be avoided Outcome: Progressing   Problem: Activity: Goal: Risk for activity intolerance will decrease Outcome: Progressing   Problem: Elimination: Goal: Will not experience complications related to bowel motility Outcome: Progressing Goal: Will not experience complications related to urinary retention Outcome: Progressing   Problem: Safety: Goal: Ability to remain free from injury will improve Outcome: Progressing   Problem: Pain Managment: Goal: General experience of comfort will improve Outcome: Progressing   Problem: Skin Integrity: Goal: Risk for impaired skin integrity will decrease Outcome: Progressing

## 2021-07-04 NOTE — Progress Notes (Signed)
Patient ID: Robert Dickerson, male   DOB: 1941/04/18, 80 y.o.   MRN: [id number] Patient without complaints of pain this morning.  There is no drainage to the wound VAC canister.  Will discharge patient today.  He requires no narcotic pain medicine he has prescription at home.  He will continue with the portable Praveena wound VAC pump at discharge.

## 2021-07-04 NOTE — Progress Notes (Signed)
Discharge instructions given to patient and wife at bedside. No concerns voiced. Wound vac (Preveena) without charge; therefore patient had to wait for vac to get adequately charged before leaving.
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
## 2021-07-04 NOTE — Progress Notes (Signed)
Pt discharged to home. Left unit in wheelchair pushed by Helene Kelp, NT accompanied by wife. Left in stable condition. Preveena wound vac connected. Pump working well with no alarms.  VWilliams,RN.

## 2021-07-06 ENCOUNTER — Telehealth: Payer: Self-pay

## 2021-07-06 ENCOUNTER — Other Ambulatory Visit: Payer: Self-pay | Admitting: Orthopedic Surgery

## 2021-07-06 ENCOUNTER — Encounter (HOSPITAL_COMMUNITY): Payer: Self-pay | Admitting: Orthopedic Surgery

## 2021-07-06 ENCOUNTER — Ambulatory Visit (INDEPENDENT_AMBULATORY_CARE_PROVIDER_SITE_OTHER): Payer: Medicare Other | Admitting: Orthopedic Surgery

## 2021-07-06 DIAGNOSIS — S81811D Laceration without foreign body, right lower leg, subsequent encounter: Secondary | ICD-10-CM

## 2021-07-06 LAB — AEROBIC/ANAEROBIC CULTURE W GRAM STAIN (SURGICAL/DEEP WOUND): Gram Stain: NONE SEEN

## 2021-07-06 MED ORDER — CIPROFLOXACIN HCL 500 MG PO TABS: 500.0000 mg | ORAL_TABLET | Freq: Two times a day (BID) | ORAL | 0 refills | Status: DC

## 2021-07-06 NOTE — Telephone Encounter (Signed)
I called pt to offer appt this morning and he states that he already called back and make an appt for 10:30 today.

## 2021-07-06 NOTE — Progress Notes (Signed)
Office Visit Note   Patient: Robert Dickerson           Date of Birth: 1940-08-23           MRN: [id number] Visit Date: 07/06/2021              Requested by: Lavone Orn, MD [address] [place],  [place] [address] PCP: Lavone Orn, MD  Chief Complaint  Patient presents with   Right Leg - Routine Post Op    07/03/21 RLE debridement and Kerecis graft application       HPI: Patient is a 80 year old gentleman who is status post repeat skin graft right lower extremity.  Patient had no drainage in the hospital with his leg elevated he was discharged with the portable Praveena pump and had immediate increased drainage at discharge to home presents at this time stating the vac is full.  Assessment & Plan: Visit Diagnoses:  1. Laceration of right lower leg, subsequent encounter     Plan: Recommended elevation of his foot recommended 4 x 4's and Ace wrap dry dressing change daily.  Follow-up in 1 week may be able to start Silvadene dressing changes or compression  garments.  Follow-Up Instructions: Return in about 1 week (around 07/13/2021).   Ortho Exam  Patient is alert, oriented, no adenopathy, well-dressed, normal affect, normal respiratory effort. Examination the wound beds have healthy granulation tissue there is no ischemic changes no cellulitis no signs of infection.  There is no drainage.  Imaging: No results found.    Labs: Lab Results  Component Value Date   HGBA1C 7.6 (H) 05/19/2021   HGBA1C 9.5 (H) 12/23/2020   LABURIC 6.4 03/04/2011   REPTSTATUS PENDING 07/01/2021   GRAMSTAIN  07/01/2021    NO WBC SEEN NO ORGANISMS SEEN Performed at [facility], [place] [address], [place], [place] [address]    CULT  07/01/2021    FEW STAPHYLOCOCCUS AUREUS RARE PROTEUS MIRABILIS NO ANAEROBES ISOLATED; CULTURE IN PROGRESS FOR 5 DAYS    LABORGA STAPHYLOCOCCUS AUREUS 07/01/2021   LABORGA PROTEUS MIRABILIS 07/01/2021     Lab Results  Component Value  Date   ALBUMIN 2.6 (L) 06/05/2021   ALBUMIN 3.2 (L) 05/18/2021   ALBUMIN 3.5 (L) 12/15/2015    Lab Results  Component Value Date   MG 2.2 05/19/2021   MG 2.0 12/23/2020   MG 2.0 12/23/2020   No results found for: VD25OH  No results found for: PREALBUMIN CBC EXTENDED Latest Ref Rng & Units 07/01/2021 06/05/2021 05/21/2021  WBC 4.0 - 10.5 K/uL - 10.0 11.2(H)  RBC 4.22 - 5.81 MIL/uL - 4.32 4.02(L)  HGB 13.0 - 17.0 g/dL 13.6 12.5(L) 11.8(L)  HCT 39.0 - 52.0 % 40.0 37.1(L) 35.5(L)  PLT 150 - 400 K/uL - 492(H) 317  NEUTROABS 1.7 - 7.7 K/uL - 8.0(H) -  LYMPHSABS 0.7 - 4.0 K/uL - 1.0 -     There is no height or weight on file to calculate BMI.  Orders:  No orders of the defined types were placed in this encounter.  No orders of the defined types were placed in this encounter.    Procedures: No procedures performed  Clinical Data: No additional findings.  ROS:  All other systems negative, except as noted in the HPI. Review of Systems  Objective: Vital Signs: There were no vitals taken for this visit.  Specialty Comments:  No specialty comments available.  PMFS History: Patient Active Problem List   Diagnosis  Date Noted   Wound of right leg, sequela 07/01/2021   Nail, injury by 06/10/2021   Laceration of leg 05/20/2021   Acute respiratory failure with hypoxia (HCC) 05/18/2021   Chronic diastolic CHF (congestive heart failure) ([place]) 05/18/2021   Chronic kidney disease (CKD), stage IV (severe) ([place]) 05/18/2021   Hypertension associated with diabetes ([place]) 05/18/2021   Insulin dependent type 2 diabetes mellitus ([place]) 05/18/2021   OSA on CPAP 05/18/2021   Laceration of right lower extremity 05/18/2021   Acute kidney injury superimposed on CKD ([place]) 05/18/2021   Current chronic use of systemic steroids 01/08/2021   Preoperative cardiovascular examination 01/08/2021   Cellulitis 12/23/2020   Sepsis ([place]) 12/23/2020   Acute kidney injury superimposed on chronic  kidney disease ([place]) 12/23/2020   Hypokalemia 12/23/2020   Benign neoplasm of duodenum, jejunum, 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   Chronic kidney disease, stage 4 (severe) ([place]) 12/03/2020   Degeneration of lumbar intervertebral disc 12/03/2020   Type 2 diabetes mellitus, uncontrolled, with neuropathy 12/03/2020   Diabetic peripheral neuropathy associated with type 2 diabetes mellitus ([place]) 22/97/9892   Diastolic heart failure ([place]) 12/03/2020   Enlarged prostate 12/03/2020   Erectile dysfunction due to arterial insufficiency 12/03/2020   Essential hypertension 12/03/2020   Gastro-esophageal reflux disease without esophagitis 12/03/2020   Hematuria 12/03/2020   Impaired fasting glucose 12/03/2020   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   Morbid obesity ([place]) 12/03/2020   Obstructive sleep apnea 12/03/2020   Osteoarthritis of hip 12/03/2020   Osteoarthritis of knee 12/03/2020   Polymyalgia rheumatica ([place]) 12/03/2020   Restless legs 12/03/2020   Skin sensation disturbance 12/03/2020   Body mass index (BMI) 29.0-29.9, adult 11/21/2020   Lumbar stenosis 11/21/2020   Tendinitis 09/03/2020   Subacute arthropathy 09/03/2020   Bilateral hearing loss 08/11/2020   Impacted cerumen of left ear 03/10/2017   Excessive cerumen in left ear canal 03/10/2017   Faintness 12/09/2015   Backache 11/14/2014   CHRONIC PANCREATITIS 06/16/2009   Past Medical History:  Diagnosis Date   Arthritis    all joints   Chronic diastolic CHF (congestive heart failure) (HCC)    Chronic kidney disease (CKD), stage IV (severe) (HCC)    Chronic kidney disease, stage 3 (HCC)    Congestive heart failure with LV diastolic dysfunction, NYHA class 2 ([place])    Family history of adverse reaction to anesthesia    father- change in personaility   GERD (gastroesophageal reflux disease)    Hypertension    Hypertension associated with diabetes ([place])    Hypertensive chronic  kidney disease    Insulin dependent type 2 diabetes mellitus ([place])    Neuropathy    OSA on CPAP    Sleep apnea    uses cpap, pt does not know settings    Family History  Problem Relation Age of Onset   Hypertension Mother    Anesthesia problems Neg Hx    Hypotension Neg Hx    Malignant hyperthermia Neg Hx    Pseudochol deficiency Neg Hx     Past Surgical History:  Procedure Laterality Date   ANTERIOR LAT LUMBAR FUSION Left 01/22/2021   Procedure: Lumbar one-Lumbar two Lateral Lumbar Interbody Fusion;  Surgeon: Vallarie Mare, MD;  Location: [facility];  Service: Neurosurgery;  Laterality: Left;   ANTERIOR LAT LUMBAR FUSION  01/22/2021   APPENDECTOMY     BACK SURGERY     4 surg, lower   CHOLECYSTECTOMY     COLONOSCOPY  WITH PROPOFOL N/A 06/11/2014   Procedure: COLONOSCOPY WITH PROPOFOL;  Surgeon: Garlan Fair, MD;  Location: [facility];  Service: Endoscopy;  Laterality: N/A;   ESOPHAGOGASTRODUODENOSCOPY  08/26/2011   Procedure: ESOPHAGOGASTRODUODENOSCOPY (EGD);  Surgeon: Garlan Fair, MD;  Location: [facility];  Service: Endoscopy;  Laterality: N/A;   ESOPHAGOGASTRODUODENOSCOPY (EGD) WITH PROPOFOL N/A 06/11/2014   Procedure: ESOPHAGOGASTRODUODENOSCOPY (EGD) WITH PROPOFOL;  Surgeon: Garlan Fair, MD;  Location: [facility];  Service: Endoscopy;  Laterality: N/A;   EUS  09/15/2011   Procedure: UPPER ENDOSCOPIC ULTRASOUND (EUS) LINEAR;  Surgeon: Landry Dyke, MD;  Location: [facility];  Service: Endoscopy;  Laterality: N/A;  MAC   I & D EXTREMITY Right 05/20/2021   Procedure: IRRIGATION AND DEBRIDEMENT OF LEG AND APPLICATION OF SKIN GRAFT;  Surgeon: Newt Minion, MD;  Location: [facility];  Service: Orthopedics;  Laterality: Right;   I & D EXTREMITY Right 07/01/2021   Procedure: RIGHT LEG DEBRIDEMENT;  Surgeon: Newt Minion, MD;  Location: [facility];  Service: Orthopedics;  Laterality: Right;   I & D EXTREMITY Right 07/03/2021   Procedure: REPEAT RIGHT LEG DEBRIDEMENT;   Surgeon: Newt Minion, MD;  Location: [facility];  Service: Orthopedics;  Laterality: Right;   KNEE ARTHROSCOPY Right YEARS AGO   Social History   Occupational History   Not on file  Tobacco Use   Smoking status: Former    Packs/day: 2.00    Years: 20.00    Pack years: 40.00    Types: Cigarettes    Quit date: 55    Years since quitting: 38.9   Smokeless tobacco: Never  Vaping Use   Vaping Use: Never used  Substance and Sexual Activity   Alcohol use: Not Currently   Drug use: Never   Sexual activity: Not on file

## 2021-07-06 NOTE — Telephone Encounter (Signed)
Patient called triage. He states that his wound vac filled up last night. He needs an appointment with Dr.Duda today if that is okay? Please call him at [phone]  Thanks!

## 2021-07-07 ENCOUNTER — Telehealth: Payer: Self-pay | Admitting: Orthopedic Surgery

## 2021-07-07 ENCOUNTER — Encounter: Payer: PRIVATE HEALTH INSURANCE | Admitting: Vascular Surgery

## 2021-07-07 ENCOUNTER — Encounter (HOSPITAL_COMMUNITY): Payer: PRIVATE HEALTH INSURANCE

## 2021-07-07 NOTE — Telephone Encounter (Signed)
Pt informed of the below message.

## 2021-07-07 NOTE — Telephone Encounter (Signed)
-----   Message from Altamese Cicero sent at 07/07/2021  8:09 AM EST -----  ----- Message ----- From: Newt Minion, MD Sent: 07/06/2021   5:05 PM EST To: Pamella Pert, RMA  Call patient.  Prescription for Cipro sent to [facility].  Cultures came back positive for 2 bacteria that are both sensitive to the Cipro.  He may stop the doxycycline once he is started the Cipro. ----- Message ----- From: Interface, Lab In [place] Sent: 07/06/2021   2:38 PM EST To: Newt Minion, MD

## 2021-07-08 DIAGNOSIS — M545 Low back pain, unspecified: Secondary | ICD-10-CM | POA: Diagnosis not present

## 2021-07-08 DIAGNOSIS — G894 Chronic pain syndrome: Secondary | ICD-10-CM | POA: Diagnosis not present

## 2021-07-08 DIAGNOSIS — E1122 Type 2 diabetes mellitus with diabetic chronic kidney disease: Secondary | ICD-10-CM | POA: Diagnosis not present

## 2021-07-08 DIAGNOSIS — R7303 Prediabetes: Secondary | ICD-10-CM | POA: Diagnosis not present

## 2021-07-08 DIAGNOSIS — I5032 Chronic diastolic (congestive) heart failure: Secondary | ICD-10-CM | POA: Diagnosis not present

## 2021-07-08 DIAGNOSIS — N184 Chronic kidney disease, stage 4 (severe): Secondary | ICD-10-CM | POA: Diagnosis not present

## 2021-07-08 DIAGNOSIS — H26493 Other secondary cataract, bilateral: Secondary | ICD-10-CM | POA: Diagnosis not present

## 2021-07-08 DIAGNOSIS — S81811D Laceration without foreign body, right lower leg, subsequent encounter: Secondary | ICD-10-CM | POA: Diagnosis not present

## 2021-07-09 DIAGNOSIS — G894 Chronic pain syndrome: Secondary | ICD-10-CM | POA: Diagnosis not present

## 2021-07-09 DIAGNOSIS — E1122 Type 2 diabetes mellitus with diabetic chronic kidney disease: Secondary | ICD-10-CM | POA: Diagnosis not present

## 2021-07-09 DIAGNOSIS — S81811D Laceration without foreign body, right lower leg, subsequent encounter: Secondary | ICD-10-CM | POA: Diagnosis not present

## 2021-07-09 DIAGNOSIS — M545 Low back pain, unspecified: Secondary | ICD-10-CM | POA: Diagnosis not present

## 2021-07-09 DIAGNOSIS — N184 Chronic kidney disease, stage 4 (severe): Secondary | ICD-10-CM | POA: Diagnosis not present

## 2021-07-09 DIAGNOSIS — I5032 Chronic diastolic (congestive) heart failure: Secondary | ICD-10-CM | POA: Diagnosis not present

## 2021-07-10 ENCOUNTER — Encounter (HOSPITAL_COMMUNITY): Payer: Self-pay | Admitting: Orthopedic Surgery

## 2021-07-14 ENCOUNTER — Ambulatory Visit (INDEPENDENT_AMBULATORY_CARE_PROVIDER_SITE_OTHER): Payer: Medicare Other | Admitting: Orthopedic Surgery

## 2021-07-14 ENCOUNTER — Encounter: Payer: Self-pay | Admitting: Orthopedic Surgery

## 2021-07-14 DIAGNOSIS — S81811D Laceration without foreign body, right lower leg, subsequent encounter: Secondary | ICD-10-CM

## 2021-07-14 NOTE — Progress Notes (Signed)
Patient is a 80 year old gentleman who is seen in follow-up for ulceration to the right leg.  He underwent repeat debridement and Kerecis skin graft on December 2.  He is almost 2 weeks out.  Patient states he has been having increasing pain he has been spending time at night with his legs dependent.  Examination the wound there is healthy granulation tissue involving both wounds.  There is no cellulitis no odor no drainage no signs of infection.  Patient has pitting edema with increased venous swelling with brawny skin color changes.  I feel the swelling has currently limited the healing of the wound.  Discussed the possibility of using a compression sock versus a 3 layer compression wrap.  Patient states he would like to use the 3 layer compression wrap and evaluate for compression socks next week.  3 layer wrap was applied.

## 2021-07-17 DIAGNOSIS — E1122 Type 2 diabetes mellitus with diabetic chronic kidney disease: Secondary | ICD-10-CM | POA: Diagnosis not present

## 2021-07-17 DIAGNOSIS — G894 Chronic pain syndrome: Secondary | ICD-10-CM | POA: Diagnosis not present

## 2021-07-17 DIAGNOSIS — N184 Chronic kidney disease, stage 4 (severe): Secondary | ICD-10-CM | POA: Diagnosis not present

## 2021-07-17 DIAGNOSIS — S81811D Laceration without foreign body, right lower leg, subsequent encounter: Secondary | ICD-10-CM | POA: Diagnosis not present

## 2021-07-17 DIAGNOSIS — I5032 Chronic diastolic (congestive) heart failure: Secondary | ICD-10-CM | POA: Diagnosis not present

## 2021-07-17 DIAGNOSIS — M545 Low back pain, unspecified: Secondary | ICD-10-CM | POA: Diagnosis not present

## 2021-07-18 DIAGNOSIS — I5032 Chronic diastolic (congestive) heart failure: Secondary | ICD-10-CM | POA: Diagnosis not present

## 2021-07-18 DIAGNOSIS — M545 Low back pain, unspecified: Secondary | ICD-10-CM | POA: Diagnosis not present

## 2021-07-18 DIAGNOSIS — N184 Chronic kidney disease, stage 4 (severe): Secondary | ICD-10-CM | POA: Diagnosis not present

## 2021-07-18 DIAGNOSIS — G894 Chronic pain syndrome: Secondary | ICD-10-CM | POA: Diagnosis not present

## 2021-07-18 DIAGNOSIS — S81811D Laceration without foreign body, right lower leg, subsequent encounter: Secondary | ICD-10-CM | POA: Diagnosis not present

## 2021-07-18 DIAGNOSIS — E1122 Type 2 diabetes mellitus with diabetic chronic kidney disease: Secondary | ICD-10-CM | POA: Diagnosis not present

## 2021-07-20 ENCOUNTER — Ambulatory Visit (INDEPENDENT_AMBULATORY_CARE_PROVIDER_SITE_OTHER): Payer: Medicare Other | Admitting: Orthopedic Surgery

## 2021-07-20 ENCOUNTER — Encounter: Payer: Self-pay | Admitting: Orthopedic Surgery

## 2021-07-20 DIAGNOSIS — M5136 Other intervertebral disc degeneration, lumbar region: Secondary | ICD-10-CM | POA: Diagnosis not present

## 2021-07-20 DIAGNOSIS — I10 Essential (primary) hypertension: Secondary | ICD-10-CM | POA: Diagnosis not present

## 2021-07-20 DIAGNOSIS — S81811D Laceration without foreign body, right lower leg, subsequent encounter: Secondary | ICD-10-CM

## 2021-07-20 DIAGNOSIS — Z981 Arthrodesis status: Secondary | ICD-10-CM | POA: Diagnosis not present

## 2021-07-20 DIAGNOSIS — Z6827 Body mass index (BMI) 27.0-27.9, adult: Secondary | ICD-10-CM | POA: Diagnosis not present

## 2021-07-20 NOTE — Progress Notes (Signed)
Office Visit Note   Patient: Robert Dickerson           Date of Birth: 25-Apr-1941           MRN: [id number] Visit Date: 07/20/2021              Requested by: Lavone Orn, MD [address] [place],  [place] [address] PCP: Lavone Orn, MD  Chief Complaint  Patient presents with   Left Leg - Routine Post Op    07/03/21 Repeat debridement Kerecis graft       HPI: Patient is a 80 year old gentleman who is status post Kerecis graft traumatic wound right leg he has been in a 3 layer compression wrap and states that recently he has been having increasing pain.  Assessment & Plan: Visit Diagnoses:  1. Laceration of right lower leg, subsequent encounter     Plan: Staples harvested he was placed in a compression sleeve.  Recommended he obtain a pair of extra-large Vive socks change and wear daily.  Patient is to wear the socks around-the-clock.  Follow-Up Instructions: Return in about 4 weeks (around 08/17/2021).   Ortho Exam  Patient is alert, oriented, no adenopathy, well-dressed, normal affect, normal respiratory effort. Examination there is good wrinkling of the skin there is no cellulitis there is good healthy granulation tissue in the wound bed which measures 9 x 10 cm.  The calf is 39 cm in circumference.  The lateral wound is approximately 2 cm in diameter and also has healthy granulation tissue.  Imaging: No results found.    Labs: Lab Results  Component Value Date   HGBA1C 7.6 (H) 05/19/2021   HGBA1C 9.5 (H) 12/23/2020   LABURIC 6.4 03/04/2011   REPTSTATUS 07/06/2021 FINAL 07/01/2021   GRAMSTAIN NO WBC SEEN NO ORGANISMS SEEN  07/01/2021   CULT  07/01/2021    FEW STAPHYLOCOCCUS AUREUS RARE PROTEUS MIRABILIS NO ANAEROBES ISOLATED Performed at [facility], [place] [address], [place], [place] [address]    Anthony 07/01/2021   LABORGA PROTEUS MIRABILIS 07/01/2021     Lab Results  Component Value Date   ALBUMIN 2.6 (L)  06/05/2021   ALBUMIN 3.2 (L) 05/18/2021   ALBUMIN 3.5 (L) 12/15/2015    Lab Results  Component Value Date   MG 2.2 05/19/2021   MG 2.0 12/23/2020   MG 2.0 12/23/2020   No results found for: VD25OH  No results found for: PREALBUMIN CBC EXTENDED Latest Ref Rng & Units 07/01/2021 06/05/2021 05/21/2021  WBC 4.0 - 10.5 K/uL - 10.0 11.2(H)  RBC 4.22 - 5.81 MIL/uL - 4.32 4.02(L)  HGB 13.0 - 17.0 g/dL 13.6 12.5(L) 11.8(L)  HCT 39.0 - 52.0 % 40.0 37.1(L) 35.5(L)  PLT 150 - 400 K/uL - 492(H) 317  NEUTROABS 1.7 - 7.7 K/uL - 8.0(H) -  LYMPHSABS 0.7 - 4.0 K/uL - 1.0 -     There is no height or weight on file to calculate BMI.  Orders:  No orders of the defined types were placed in this encounter.  No orders of the defined types were placed in this encounter.    Procedures: No procedures performed  Clinical Data: No additional findings.  ROS:  All other systems negative, except as noted in the HPI. Review of Systems  Objective: Vital Signs: There were no vitals taken for this visit.  Specialty Comments:  No specialty comments available.  PMFS History: Patient Active Problem List   Diagnosis Date Noted  Wound of right leg, sequela 07/01/2021   Nail, injury by 06/10/2021   Laceration of leg 05/20/2021   Acute respiratory failure with hypoxia (HCC) 05/18/2021   Chronic diastolic CHF (congestive heart failure) ([place]) 05/18/2021   Chronic kidney disease (CKD), stage IV (severe) ([place]) 05/18/2021   Hypertension associated with diabetes ([place]) 05/18/2021   Insulin dependent type 2 diabetes mellitus ([place]) 05/18/2021   OSA on CPAP 05/18/2021   Laceration of right lower extremity 05/18/2021   Acute kidney injury superimposed on CKD ([place]) 05/18/2021   Current chronic use of systemic steroids 01/08/2021   Preoperative cardiovascular examination 01/08/2021   Cellulitis 12/23/2020   Sepsis ([place]) 12/23/2020   Acute kidney injury superimposed on chronic kidney disease ([place])  12/23/2020   Hypokalemia 12/23/2020   Benign neoplasm of duodenum, jejunum, 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   Chronic kidney disease, stage 4 (severe) ([place]) 12/03/2020   Degeneration of lumbar intervertebral disc 12/03/2020   Type 2 diabetes mellitus, uncontrolled, with neuropathy 12/03/2020   Diabetic peripheral neuropathy associated with type 2 diabetes mellitus ([place]) 30/16/0109   Diastolic heart failure ([place]) 12/03/2020   Enlarged prostate 12/03/2020   Erectile dysfunction due to arterial insufficiency 12/03/2020   Essential hypertension 12/03/2020   Gastro-esophageal reflux disease without esophagitis 12/03/2020   Hematuria 12/03/2020   Impaired fasting glucose 12/03/2020   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   Morbid obesity ([place]) 12/03/2020   Obstructive sleep apnea 12/03/2020   Osteoarthritis of hip 12/03/2020   Osteoarthritis of knee 12/03/2020   Polymyalgia rheumatica ([place]) 12/03/2020   Restless legs 12/03/2020   Skin sensation disturbance 12/03/2020   Body mass index (BMI) 29.0-29.9, adult 11/21/2020   Lumbar stenosis 11/21/2020   Tendinitis 09/03/2020   Subacute arthropathy 09/03/2020   Bilateral hearing loss 08/11/2020   Impacted cerumen of left ear 03/10/2017   Excessive cerumen in left ear canal 03/10/2017   Faintness 12/09/2015   Backache 11/14/2014   CHRONIC PANCREATITIS 06/16/2009   Past Medical History:  Diagnosis Date   Arthritis    all joints   Chronic diastolic CHF (congestive heart failure) (HCC)    Chronic kidney disease (CKD), stage IV (severe) (HCC)    Chronic kidney disease, stage 3 (HCC)    Congestive heart failure with LV diastolic dysfunction, NYHA class 2 ([place])    Family history of adverse reaction to anesthesia    father- change in personaility   GERD (gastroesophageal reflux disease)    Hypertension    Hypertension associated with diabetes ([place])    Hypertensive chronic kidney disease     Insulin dependent type 2 diabetes mellitus ([place])    Neuropathy    OSA on CPAP    Sleep apnea    uses cpap, pt does not know settings    Family History  Problem Relation Age of Onset   Hypertension Mother    Anesthesia problems Neg Hx    Hypotension Neg Hx    Malignant hyperthermia Neg Hx    Pseudochol deficiency Neg Hx     Past Surgical History:  Procedure Laterality Date   ANTERIOR LAT LUMBAR FUSION Left 01/22/2021   Procedure: Lumbar one-Lumbar two Lateral Lumbar Interbody Fusion;  Surgeon: Vallarie Mare, MD;  Location: [facility];  Service: Neurosurgery;  Laterality: Left;   ANTERIOR LAT LUMBAR FUSION  01/22/2021   APPENDECTOMY     BACK SURGERY     4 surg, lower   CHOLECYSTECTOMY     COLONOSCOPY WITH PROPOFOL N/A 06/11/2014  Procedure: COLONOSCOPY WITH PROPOFOL;  Surgeon: Garlan Fair, MD;  [facility];  Service: Endoscopy;  Laterality: N/A;   ESOPHAGOGASTRODUODENOSCOPY  08/26/2011   Procedure: ESOPHAGOGASTRODUODENOSCOPY (EGD);  Surgeon: Garlan Fair, MD;  [facility];  Service: Endoscopy;  Laterality: N/A;   ESOPHAGOGASTRODUODENOSCOPY (EGD) WITH PROPOFOL N/A 06/11/2014   Procedure: ESOPHAGOGASTRODUODENOSCOPY (EGD) WITH PROPOFOL;  Surgeon: Garlan Fair, MD;  [facility];  Service: Endoscopy;  Laterality: N/A;   EUS  09/15/2011   Procedure: UPPER ENDOSCOPIC ULTRASOUND (EUS) LINEAR;  Surgeon: Landry Dyke, MD;  [facility];  Service: Endoscopy;  Laterality: N/A;  MAC   I & D EXTREMITY Right 05/20/2021   Procedure: IRRIGATION AND DEBRIDEMENT OF LEG AND APPLICATION OF SKIN GRAFT;  Surgeon: Newt Minion, MD;  Location: [facility];  Service: Orthopedics;  Laterality: Right;   I & D EXTREMITY Right 07/03/2021   Procedure: REPEAT RIGHT LEG DEBRIDEMENT;  Surgeon: Newt Minion, MD;  Location: [facility];  Service: Orthopedics;  Laterality: Right;   I & D EXTREMITY Right 07/01/2021   Procedure: RIGHT LEG DEBRIDEMENT;  Surgeon: Newt Minion, MD;  Location: [facility];  Service: Orthopedics;  Laterality: Right;   KNEE ARTHROSCOPY Right YEARS AGO   Social History   Occupational History   Not on file  Tobacco Use   Smoking status: Former    Packs/day: 2.00    Years: 20.00    Pack years: 40.00    Types: Cigarettes    Quit date: 55    Years since quitting: 38.9   Smokeless tobacco: Never  Vaping Use   Vaping Use: Never used  Substance and Sexual Activity   Alcohol use: Not Currently   Drug use: Never   Sexual activity: Not on file

## 2021-07-21 DIAGNOSIS — E1122 Type 2 diabetes mellitus with diabetic chronic kidney disease: Secondary | ICD-10-CM | POA: Diagnosis not present

## 2021-07-21 DIAGNOSIS — N184 Chronic kidney disease, stage 4 (severe): Secondary | ICD-10-CM | POA: Diagnosis not present

## 2021-07-21 DIAGNOSIS — M545 Low back pain, unspecified: Secondary | ICD-10-CM | POA: Diagnosis not present

## 2021-07-21 DIAGNOSIS — I5032 Chronic diastolic (congestive) heart failure: Secondary | ICD-10-CM | POA: Diagnosis not present

## 2021-07-21 DIAGNOSIS — G894 Chronic pain syndrome: Secondary | ICD-10-CM | POA: Diagnosis not present

## 2021-07-21 DIAGNOSIS — S81811D Laceration without foreign body, right lower leg, subsequent encounter: Secondary | ICD-10-CM | POA: Diagnosis not present

## 2021-07-25 DIAGNOSIS — Z9981 Dependence on supplemental oxygen: Secondary | ICD-10-CM | POA: Diagnosis not present

## 2021-07-25 DIAGNOSIS — I152 Hypertension secondary to endocrine disorders: Secondary | ICD-10-CM | POA: Diagnosis not present

## 2021-07-25 DIAGNOSIS — K219 Gastro-esophageal reflux disease without esophagitis: Secondary | ICD-10-CM | POA: Diagnosis not present

## 2021-07-25 DIAGNOSIS — E1159 Type 2 diabetes mellitus with other circulatory complications: Secondary | ICD-10-CM | POA: Diagnosis not present

## 2021-07-25 DIAGNOSIS — E1122 Type 2 diabetes mellitus with diabetic chronic kidney disease: Secondary | ICD-10-CM | POA: Diagnosis not present

## 2021-07-25 DIAGNOSIS — Z981 Arthrodesis status: Secondary | ICD-10-CM | POA: Diagnosis not present

## 2021-07-25 DIAGNOSIS — K5730 Diverticulosis of large intestine without perforation or abscess without bleeding: Secondary | ICD-10-CM | POA: Diagnosis not present

## 2021-07-25 DIAGNOSIS — G319 Degenerative disease of nervous system, unspecified: Secondary | ICD-10-CM | POA: Diagnosis not present

## 2021-07-25 DIAGNOSIS — E1140 Type 2 diabetes mellitus with diabetic neuropathy, unspecified: Secondary | ICD-10-CM | POA: Diagnosis not present

## 2021-07-25 DIAGNOSIS — N184 Chronic kidney disease, stage 4 (severe): Secondary | ICD-10-CM | POA: Diagnosis not present

## 2021-07-25 DIAGNOSIS — G894 Chronic pain syndrome: Secondary | ICD-10-CM | POA: Diagnosis not present

## 2021-07-25 DIAGNOSIS — Z794 Long term (current) use of insulin: Secondary | ICD-10-CM | POA: Diagnosis not present

## 2021-07-25 DIAGNOSIS — Z7952 Long term (current) use of systemic steroids: Secondary | ICD-10-CM | POA: Diagnosis not present

## 2021-07-25 DIAGNOSIS — I5032 Chronic diastolic (congestive) heart failure: Secondary | ICD-10-CM | POA: Diagnosis not present

## 2021-07-25 DIAGNOSIS — Z87891 Personal history of nicotine dependence: Secondary | ICD-10-CM | POA: Diagnosis not present

## 2021-07-25 DIAGNOSIS — E669 Obesity, unspecified: Secondary | ICD-10-CM | POA: Diagnosis not present

## 2021-07-25 DIAGNOSIS — N179 Acute kidney failure, unspecified: Secondary | ICD-10-CM | POA: Diagnosis not present

## 2021-07-25 DIAGNOSIS — M50321 Other cervical disc degeneration at C4-C5 level: Secondary | ICD-10-CM | POA: Diagnosis not present

## 2021-07-25 DIAGNOSIS — Z9181 History of falling: Secondary | ICD-10-CM | POA: Diagnosis not present

## 2021-07-25 DIAGNOSIS — M4802 Spinal stenosis, cervical region: Secondary | ICD-10-CM | POA: Diagnosis not present

## 2021-07-25 DIAGNOSIS — Z945 Skin transplant status: Secondary | ICD-10-CM | POA: Diagnosis not present

## 2021-07-25 DIAGNOSIS — Z4801 Encounter for change or removal of surgical wound dressing: Secondary | ICD-10-CM | POA: Diagnosis not present

## 2021-07-25 DIAGNOSIS — M150 Primary generalized (osteo)arthritis: Secondary | ICD-10-CM | POA: Diagnosis not present

## 2021-07-25 DIAGNOSIS — Z6831 Body mass index (BMI) 31.0-31.9, adult: Secondary | ICD-10-CM | POA: Diagnosis not present

## 2021-07-25 DIAGNOSIS — G4733 Obstructive sleep apnea (adult) (pediatric): Secondary | ICD-10-CM | POA: Diagnosis not present

## 2021-07-28 DIAGNOSIS — E1140 Type 2 diabetes mellitus with diabetic neuropathy, unspecified: Secondary | ICD-10-CM | POA: Diagnosis not present

## 2021-07-28 DIAGNOSIS — I10 Essential (primary) hypertension: Secondary | ICD-10-CM | POA: Diagnosis not present

## 2021-07-28 DIAGNOSIS — E0869 Diabetes mellitus due to underlying condition with other specified complication: Secondary | ICD-10-CM | POA: Diagnosis not present

## 2021-07-28 DIAGNOSIS — N183 Chronic kidney disease, stage 3 unspecified: Secondary | ICD-10-CM | POA: Diagnosis not present

## 2021-07-28 DIAGNOSIS — I129 Hypertensive chronic kidney disease with stage 1 through stage 4 chronic kidney disease, or unspecified chronic kidney disease: Secondary | ICD-10-CM | POA: Diagnosis not present

## 2021-07-30 ENCOUNTER — Other Ambulatory Visit: Payer: Self-pay

## 2021-07-30 ENCOUNTER — Telehealth: Payer: Self-pay | Admitting: Orthopedic Surgery

## 2021-07-30 ENCOUNTER — Ambulatory Visit (INDEPENDENT_AMBULATORY_CARE_PROVIDER_SITE_OTHER): Payer: Medicare Other | Admitting: Orthopedic Surgery

## 2021-07-30 DIAGNOSIS — S81811D Laceration without foreign body, right lower leg, subsequent encounter: Secondary | ICD-10-CM

## 2021-07-30 NOTE — Telephone Encounter (Signed)
I called the pt and made an appt for him to come in today. In the future this pt should have been transferred to triage for immediate assistance. Please do not send as a message.

## 2021-07-30 NOTE — Telephone Encounter (Signed)
Pt called and states R leg is hurting where the wound is. He was hoping the nurse was coming today but she is not. Pt also states it has a smell. He has been wearing a compression for 2 weeks. Can he please be worked in today?   CB [phone]

## 2021-07-31 DIAGNOSIS — E1122 Type 2 diabetes mellitus with diabetic chronic kidney disease: Secondary | ICD-10-CM | POA: Diagnosis not present

## 2021-07-31 DIAGNOSIS — Z4801 Encounter for change or removal of surgical wound dressing: Secondary | ICD-10-CM | POA: Diagnosis not present

## 2021-07-31 DIAGNOSIS — N184 Chronic kidney disease, stage 4 (severe): Secondary | ICD-10-CM | POA: Diagnosis not present

## 2021-07-31 DIAGNOSIS — I5032 Chronic diastolic (congestive) heart failure: Secondary | ICD-10-CM | POA: Diagnosis not present

## 2021-07-31 DIAGNOSIS — G894 Chronic pain syndrome: Secondary | ICD-10-CM | POA: Diagnosis not present

## 2021-07-31 DIAGNOSIS — N179 Acute kidney failure, unspecified: Secondary | ICD-10-CM | POA: Diagnosis not present

## 2021-08-04 ENCOUNTER — Encounter: Payer: Self-pay | Admitting: Orthopedic Surgery

## 2021-08-04 ENCOUNTER — Encounter: Payer: Self-pay | Admitting: Family

## 2021-08-04 ENCOUNTER — Ambulatory Visit (INDEPENDENT_AMBULATORY_CARE_PROVIDER_SITE_OTHER): Payer: Medicare Other | Admitting: Family

## 2021-08-04 ENCOUNTER — Other Ambulatory Visit: Payer: Self-pay

## 2021-08-04 DIAGNOSIS — L209 Atopic dermatitis, unspecified: Secondary | ICD-10-CM

## 2021-08-04 DIAGNOSIS — Z945 Skin transplant status: Secondary | ICD-10-CM

## 2021-08-04 MED ORDER — CLOBETASOL PROPIONATE 0.05 % EX CREA: 1.0000 "application " | TOPICAL_CREAM | Freq: Two times a day (BID) | CUTANEOUS | 0 refills | Status: DC

## 2021-08-04 MED ORDER — HYDROXYZINE PAMOATE 100 MG PO CAPS: 100.0000 mg | ORAL_CAPSULE | Freq: Three times a day (TID) | ORAL | 0 refills | Status: DC | PRN

## 2021-08-04 NOTE — Progress Notes (Signed)
Office Visit Note   Patient: Robert Dickerson           Date of Birth: 1940/12/27           MRN: [id number] Visit Date: 07/30/2021              Requested by: Lavone Orn, MD [address] [place],  [place] [address] PCP: Lavone Orn, MD  Chief Complaint  Patient presents with   Right Leg - Routine Post Op    07/03/21 RLE debridement and Kerecis graft       HPI: Patient is a 81 year old gentleman who presents about 3 weeks status post debridement right leg with application of Kerecis graft.  He is currently wearing the Vive compression stocking.  Patient states he has pain over the medial aspect of his leg.  Assessment & Plan: Visit Diagnoses:  1. Laceration of right lower leg, subsequent encounter     Plan: We will apply 3 layer compression wrap and follow-up on Tuesday.  Anticipate resuming compression socks.  Follow-Up Instructions: Return in about 1 week (around 08/06/2021).   Ortho Exam  Patient is alert, oriented, no adenopathy, well-dressed, normal affect, normal respiratory effort. Examination there is no drainage there is swelling is decreased there is 100% healthy granulation tissue.  Patient states the pain he had yesterday has resolved there has been some increased venous swelling.  Imaging: No results found.    Labs: Lab Results  Component Value Date   HGBA1C 7.6 (H) 05/19/2021   HGBA1C 9.5 (H) 12/23/2020   LABURIC 6.4 03/04/2011   REPTSTATUS 07/06/2021 FINAL 07/01/2021   GRAMSTAIN NO WBC SEEN NO ORGANISMS SEEN  07/01/2021   CULT  07/01/2021    FEW STAPHYLOCOCCUS AUREUS RARE PROTEUS MIRABILIS NO ANAEROBES ISOLATED Performed at [facility], [place] [address], [place], [place] [address]    Meta 07/01/2021   LABORGA PROTEUS MIRABILIS 07/01/2021     Lab Results  Component Value Date   ALBUMIN 2.6 (L) 06/05/2021   ALBUMIN 3.2 (L) 05/18/2021   ALBUMIN 3.5 (L) 12/15/2015    Lab Results  Component  Value Date   MG 2.2 05/19/2021   MG 2.0 12/23/2020   MG 2.0 12/23/2020   No results found for: VD25OH  No results found for: PREALBUMIN CBC EXTENDED Latest Ref Rng & Units 07/01/2021 06/05/2021 05/21/2021  WBC 4.0 - 10.5 K/uL - 10.0 11.2(H)  RBC 4.22 - 5.81 MIL/uL - 4.32 4.02(L)  HGB 13.0 - 17.0 g/dL 13.6 12.5(L) 11.8(L)  HCT 39.0 - 52.0 % 40.0 37.1(L) 35.5(L)  PLT 150 - 400 K/uL - 492(H) 317  NEUTROABS 1.7 - 7.7 K/uL - 8.0(H) -  LYMPHSABS 0.7 - 4.0 K/uL - 1.0 -     There is no height or weight on file to calculate BMI.  Orders:  No orders of the defined types were placed in this encounter.  No orders of the defined types were placed in this encounter.    Procedures: No procedures performed  Clinical Data: No additional findings.  ROS:  All other systems negative, except as noted in the HPI. Review of Systems  Objective: Vital Signs: There were no vitals taken for this visit.  Specialty Comments:  No specialty comments available.  PMFS History: Patient Active Problem List   Diagnosis Date Noted   Wound of right leg, sequela 07/01/2021   Nail, injury by 06/10/2021   Laceration of leg 05/20/2021   Acute respiratory failure with hypoxia (  [place]) 05/18/2021   Chronic diastolic CHF (congestive heart failure) ([place]) 05/18/2021   Chronic kidney disease (CKD), stage IV (severe) ([place]) 05/18/2021   Hypertension associated with diabetes ([place]) 05/18/2021   Insulin dependent type 2 diabetes mellitus ([place]) 05/18/2021   OSA on CPAP 05/18/2021   Laceration of right lower extremity 05/18/2021   Acute kidney injury superimposed on CKD ([place]) 05/18/2021   Current chronic use of systemic steroids 01/08/2021   Preoperative cardiovascular examination 01/08/2021   Cellulitis 12/23/2020   Sepsis ([place]) 12/23/2020   Acute kidney injury superimposed on chronic kidney disease ([place]) 12/23/2020   Hypokalemia 12/23/2020   Benign neoplasm of duodenum, jejunum, 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   Chronic kidney disease, stage 4 (severe) ([place]) 12/03/2020   Degeneration of lumbar intervertebral disc 12/03/2020   Type 2 diabetes mellitus, uncontrolled, with neuropathy 12/03/2020   Diabetic peripheral neuropathy associated with type 2 diabetes mellitus ([place]) 53/74/8270   Diastolic heart failure ([place]) 12/03/2020   Enlarged prostate 12/03/2020   Erectile dysfunction due to arterial insufficiency 12/03/2020   Essential hypertension 12/03/2020   Gastro-esophageal reflux disease without esophagitis 12/03/2020   Hematuria 12/03/2020   Impaired fasting glucose 12/03/2020   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   Morbid obesity ([place]) 12/03/2020   Obstructive sleep apnea 12/03/2020   Osteoarthritis of hip 12/03/2020   Osteoarthritis of knee 12/03/2020   Polymyalgia rheumatica ([place]) 12/03/2020   Restless legs 12/03/2020   Skin sensation disturbance 12/03/2020   Body mass index (BMI) 29.0-29.9, adult 11/21/2020   Lumbar stenosis 11/21/2020   Tendinitis 09/03/2020   Subacute arthropathy 09/03/2020   Bilateral hearing loss 08/11/2020   Impacted cerumen of left ear 03/10/2017   Excessive cerumen in left ear canal 03/10/2017   Faintness 12/09/2015   Backache 11/14/2014   CHRONIC PANCREATITIS 06/16/2009   Past Medical History:  Diagnosis Date   Arthritis    all joints   Chronic diastolic CHF (congestive heart failure) (HCC)    Chronic kidney disease (CKD), stage IV (severe) (HCC)    Chronic kidney disease, stage 3 (HCC)    Congestive heart failure with LV diastolic dysfunction, NYHA class 2 ([place])    Family history of adverse reaction to anesthesia    father- change in personaility   GERD (gastroesophageal reflux disease)    Hypertension    Hypertension associated with diabetes ([place])    Hypertensive chronic kidney disease    Insulin dependent type 2 diabetes mellitus ([place])    Neuropathy    OSA on CPAP    Sleep apnea    uses  cpap, pt does not know settings    Family History  Problem Relation Age of Onset   Hypertension Mother    Anesthesia problems Neg Hx    Hypotension Neg Hx    Malignant hyperthermia Neg Hx    Pseudochol deficiency Neg Hx     Past Surgical History:  Procedure Laterality Date   ANTERIOR LAT LUMBAR FUSION Left 01/22/2021   Procedure: Lumbar one-Lumbar two Lateral Lumbar Interbody Fusion;  Surgeon: Vallarie Mare, MD;  Location: [facility];  Service: Neurosurgery;  Laterality: Left;   ANTERIOR LAT LUMBAR FUSION  01/22/2021   APPENDECTOMY     BACK SURGERY     4 surg, lower   CHOLECYSTECTOMY     COLONOSCOPY WITH PROPOFOL N/A 06/11/2014   Procedure: COLONOSCOPY WITH PROPOFOL;  Surgeon: Garlan Fair, MD;  [facility];  Service: Endoscopy;  Laterality: N/A;   ESOPHAGOGASTRODUODENOSCOPY  08/26/2011   Procedure: ESOPHAGOGASTRODUODENOSCOPY (EGD);  Surgeon: Garlan Fair, MD;  [facility];  Service: Endoscopy;  Laterality: N/A;   ESOPHAGOGASTRODUODENOSCOPY (EGD) WITH PROPOFOL N/A 06/11/2014   Procedure: ESOPHAGOGASTRODUODENOSCOPY (EGD) WITH PROPOFOL;  Surgeon: Garlan Fair, MD;  [facility];  Service: Endoscopy;  Laterality: N/A;   EUS  09/15/2011   Procedure: UPPER ENDOSCOPIC ULTRASOUND (EUS) LINEAR;  Surgeon: Landry Dyke, MD;  [facility];  Service: Endoscopy;  Laterality: N/A;  MAC   I & D EXTREMITY Right 05/20/2021   Procedure: IRRIGATION AND DEBRIDEMENT OF LEG AND APPLICATION OF SKIN GRAFT;  Surgeon: Newt Minion, MD;  Location: [facility];  Service: Orthopedics;  Laterality: Right;   I & D EXTREMITY Right 07/03/2021   Procedure: REPEAT RIGHT LEG DEBRIDEMENT;  Surgeon: Newt Minion, MD;  Location: [facility];  Service: Orthopedics;  Laterality: Right;   I & D EXTREMITY Right 07/01/2021   Procedure: RIGHT LEG DEBRIDEMENT;  Surgeon: Newt Minion, MD;  Location: [facility];  Service: Orthopedics;  Laterality: Right;   KNEE ARTHROSCOPY Right YEARS AGO    Social History   Occupational History   Not on file  Tobacco Use   Smoking status: Former    Packs/day: 2.00    Years: 20.00    Pack years: 40.00    Types: Cigarettes    Quit date: 61    Years since quitting: 39.0   Smokeless tobacco: Never  Vaping Use   Vaping Use: Never used  Substance and Sexual Activity   Alcohol use: Not Currently   Drug use: Never   Sexual activity: Not on file

## 2021-08-04 NOTE — Progress Notes (Signed)
Office Visit Note   Patient: Robert Dickerson           Date of Birth: Dec 12, 1940           MRN: [id number] Visit Date: 08/04/2021              Requested by: Lavone Orn, MD [address] [place] [place],  [place] [address] PCP: Lavone Orn, MD  Chief Complaint  Patient presents with   Right Leg - Routine Post Op    S/p repeat debridement, kerecis graft 07/03/21      HPI: Patient is a 81 year old gentleman who presents about 4 weeks status post debridement right leg with application of Kerecis graft.  He has been in a Dynaflex compression wrap since last visit.  Separately he has been having difficulty with itching has had a rash this has been over his trunk and all extremities.  The itching is separate from the issues he is having with his right lower extremity.  They report they have been trying to use hydrocortisone even triamcinolone without any improvement  Assessment & Plan: Visit Diagnoses:  No diagnosis found.   Plan: We will resume his medical compression stockings.  Dry dressing and Ace wrap applied today he will resume the compression stocking after shower this afternoon.  Have called in a prescription for Atarax as well as clobetasol for the eczema.  Discussed if he does not have any improvement with twice daily application of clobetasol he is to follow-up with his dermatologist  Follow-Up Instructions: No follow-ups on file.   Ortho Exam  Patient is alert, oriented, no adenopathy, well-dressed, normal affect, normal respiratory effort. Examination of the right lower extremity the ulcer is stable there is thin fibrinous exudative tissue this was debrided with gauze.  There is some circumferential epithelialization there is no active drainage no surrounding erythema or warmth.  His satellite lesion is stable.  There is 1 3 mm in diameter ulcer over the dorsum of his foot he feels this was from pressure from his compression wrap this has 100% bleeding granulation  tissue there is no surrounding erythema no odor no purulence  There are dry erythematous plaques over his abdomen back bilateral upper and lower extremities consistent with atopic dermatitis  Imaging: No results found.    Labs: Lab Results  Component Value Date   HGBA1C 7.6 (H) 05/19/2021   HGBA1C 9.5 (H) 12/23/2020   LABURIC 6.4 03/04/2011   REPTSTATUS 07/06/2021 FINAL 07/01/2021   GRAMSTAIN NO WBC SEEN NO ORGANISMS SEEN  07/01/2021   CULT  07/01/2021    FEW STAPHYLOCOCCUS AUREUS RARE PROTEUS MIRABILIS NO ANAEROBES ISOLATED Performed at [facility], [place] [address], [place], [place] [address]    [place] 07/01/2021   LABORGA PROTEUS MIRABILIS 07/01/2021     Lab Results  Component Value Date   ALBUMIN 2.6 (L) 06/05/2021   ALBUMIN 3.2 (L) 05/18/2021   ALBUMIN 3.5 (L) 12/15/2015    Lab Results  Component Value Date   MG 2.2 05/19/2021   MG 2.0 12/23/2020   MG 2.0 12/23/2020   No results found for: VD25OH  No results found for: PREALBUMIN CBC EXTENDED Latest Ref Rng & Units 07/01/2021 06/05/2021 05/21/2021  WBC 4.0 - 10.5 K/uL - 10.0 11.2(H)  RBC 4.22 - 5.81 MIL/uL - 4.32 4.02(L)  HGB 13.0 - 17.0 g/dL 13.6 12.5(L) 11.8(L)  HCT 39.0 - 52.0 % 40.0 37.1(L) 35.5(L)  PLT 150 - 400 K/uL - 492(H) 317  NEUTROABS 1.7 - 7.7 K/uL - 8.0(H) -  LYMPHSABS 0.7 - 4.0 K/uL - 1.0 -     There is no height or weight on file to calculate BMI.  Orders:  No orders of the defined types were placed in this encounter.  Meds ordered this encounter  Medications   hydrOXYzine (VISTARIL) 100 MG capsule    Sig: Take 1 capsule (100 mg total) by mouth 3 (three) times daily as needed for itching.    Dispense:  30 capsule    Refill:  0   clobetasol cream (TEMOVATE) 0.05 %    Sig: Apply 1 application topically 2 (two) times daily.    Dispense:  30 g    Refill:  0      Procedures: No procedures performed  Clinical Data: No additional  findings.  ROS:  All other systems negative, except as noted in the HPI. Review of Systems  Objective: Vital Signs: There were no vitals taken for this visit.  Specialty Comments:  No specialty comments available.  PMFS History: Patient Active Problem List   Diagnosis Date Noted   Wound of right leg, sequela 07/01/2021   Nail, injury by 06/10/2021   Laceration of leg 05/20/2021   Acute respiratory failure with hypoxia ([place]) 05/18/2021   Chronic diastolic CHF (congestive heart failure) ([place]) 05/18/2021   Chronic kidney disease (CKD), stage IV (severe) ([place]) 05/18/2021   Hypertension associated with diabetes ([place]) 05/18/2021   Insulin dependent type 2 diabetes mellitus ([place]) 05/18/2021   OSA on CPAP 05/18/2021   Laceration of right lower extremity 05/18/2021   Acute kidney injury superimposed on CKD (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   Chronic kidney disease, stage 4 (severe) ([place]) 12/03/2020   Degeneration of lumbar intervertebral disc 12/03/2020   Type 2 diabetes mellitus, uncontrolled, with neuropathy 12/03/2020   Diabetic peripheral neuropathy associated with type 2 diabetes mellitus ([place]) 83/41/9622   Diastolic heart failure ([place]) 12/03/2020   Enlarged prostate 12/03/2020   Erectile dysfunction due to arterial insufficiency 12/03/2020   Essential hypertension 12/03/2020   Gastro-esophageal reflux disease without esophagitis 12/03/2020   Hematuria 12/03/2020   Impaired fasting glucose 12/03/2020   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   Morbid obesity ([place]) 12/03/2020   Obstructive sleep apnea 12/03/2020    Osteoarthritis of hip 12/03/2020   Osteoarthritis of knee 12/03/2020   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/2020   Restless legs 12/03/2020   Skin sensation disturbance 12/03/2020   Body mass index (BMI) 29.0-29.9, adult 11/21/2020   Lumbar stenosis 11/21/2020   Tendinitis 09/03/2020   Subacute arthropathy 09/03/2020   Bilateral hearing loss 08/11/2020   Impacted cerumen of left ear 03/10/2017   Excessive cerumen in left ear canal 03/10/2017   Faintness 12/09/2015   Backache 11/14/2014   CHRONIC PANCREATITIS 06/16/2009   Past Medical History:  Diagnosis Date   Arthritis    all joints   Chronic diastolic CHF (congestive heart failure) (HCC)    Chronic kidney disease (CKD), stage IV (severe) (HCC)    Chronic kidney disease, stage 3 (HCC)    Congestive heart failure with LV diastolic dysfunction, NYHA class 2 ([place])    Family history of adverse reaction to anesthesia  father- change in personaility   GERD (gastroesophageal reflux disease)    Hypertension    Hypertension associated with diabetes ([place])    Hypertensive chronic kidney disease    Insulin dependent type 2 diabetes mellitus (HCC)    Neuropathy    OSA on CPAP    Sleep apnea    uses cpap, pt does not know settings    Family History  Problem Relation Age of Onset   Hypertension Mother    Anesthesia problems Neg Hx    Hypotension Neg Hx    Malignant hyperthermia Neg Hx    Pseudochol deficiency Neg Hx     Past Surgical History:  Procedure Laterality Date   ANTERIOR LAT LUMBAR FUSION Left 01/22/2021   Procedure: Lumbar one-Lumbar two Lateral Lumbar Interbody Fusion;  Surgeon: Vallarie Mare, MD;  Location: [facility];  Service: Neurosurgery;  Laterality: Left;   ANTERIOR LAT LUMBAR FUSION  01/22/2021   APPENDECTOMY     BACK SURGERY     4 surg, lower   CHOLECYSTECTOMY     COLONOSCOPY WITH PROPOFOL N/A 06/11/2014   Procedure: COLONOSCOPY WITH PROPOFOL;  Surgeon: Garlan Fair, MD;  [facility];   Service: Endoscopy;  Laterality: N/A;   ESOPHAGOGASTRODUODENOSCOPY  08/26/2011   Procedure: ESOPHAGOGASTRODUODENOSCOPY (EGD);  Surgeon: Garlan Fair, MD;  [facility];  Service: Endoscopy;  Laterality: N/A;   ESOPHAGOGASTRODUODENOSCOPY (EGD) WITH PROPOFOL N/A 06/11/2014   Procedure: ESOPHAGOGASTRODUODENOSCOPY (EGD) WITH PROPOFOL;  Surgeon: Garlan Fair, MD;  [facility];  Service: Endoscopy;  Laterality: N/A;   EUS  09/15/2011   Procedure: UPPER ENDOSCOPIC ULTRASOUND (EUS) LINEAR;  Surgeon: Landry Dyke, MD;  [facility];  Service: Endoscopy;  Laterality: N/A;  MAC   I & D EXTREMITY Right 05/20/2021   Procedure: IRRIGATION AND DEBRIDEMENT OF LEG AND APPLICATION OF SKIN GRAFT;  Surgeon: Newt Minion, MD;  Location: [facility];  Service: Orthopedics;  Laterality: Right;   I & D EXTREMITY Right 07/03/2021   Procedure: REPEAT RIGHT LEG DEBRIDEMENT;  Surgeon: Newt Minion, MD;  Location: [facility];  Service: Orthopedics;  Laterality: Right;   I & D EXTREMITY Right 07/01/2021   Procedure: RIGHT LEG DEBRIDEMENT;  Surgeon: Newt Minion, MD;  Location: [facility];  Service: Orthopedics;  Laterality: Right;   KNEE ARTHROSCOPY Right YEARS AGO   Social History   Occupational History   Not on file  Tobacco Use   Smoking status: Former    Packs/day: 2.00    Years: 20.00    Pack years: 40.00    Types: Cigarettes    Quit date: 48    Years since quitting: 39.0   Smokeless tobacco: Never  Vaping Use   Vaping Use: Never used  Substance and Sexual Activity   Alcohol use: Not Currently   Drug use: Never   Sexual activity: Not on file

## 2021-08-07 DIAGNOSIS — G894 Chronic pain syndrome: Secondary | ICD-10-CM | POA: Diagnosis not present

## 2021-08-07 DIAGNOSIS — N184 Chronic kidney disease, stage 4 (severe): Secondary | ICD-10-CM | POA: Diagnosis not present

## 2021-08-07 DIAGNOSIS — N179 Acute kidney failure, unspecified: Secondary | ICD-10-CM | POA: Diagnosis not present

## 2021-08-07 DIAGNOSIS — E1122 Type 2 diabetes mellitus with diabetic chronic kidney disease: Secondary | ICD-10-CM | POA: Diagnosis not present

## 2021-08-07 DIAGNOSIS — Z4801 Encounter for change or removal of surgical wound dressing: Secondary | ICD-10-CM | POA: Diagnosis not present

## 2021-08-07 DIAGNOSIS — I5032 Chronic diastolic (congestive) heart failure: Secondary | ICD-10-CM | POA: Diagnosis not present

## 2021-08-11 DIAGNOSIS — M1711 Unilateral primary osteoarthritis, right knee: Secondary | ICD-10-CM | POA: Diagnosis not present

## 2021-08-12 DIAGNOSIS — N184 Chronic kidney disease, stage 4 (severe): Secondary | ICD-10-CM | POA: Diagnosis not present

## 2021-08-12 DIAGNOSIS — N179 Acute kidney failure, unspecified: Secondary | ICD-10-CM | POA: Diagnosis not present

## 2021-08-12 DIAGNOSIS — E1122 Type 2 diabetes mellitus with diabetic chronic kidney disease: Secondary | ICD-10-CM | POA: Diagnosis not present

## 2021-08-12 DIAGNOSIS — G894 Chronic pain syndrome: Secondary | ICD-10-CM | POA: Diagnosis not present

## 2021-08-12 DIAGNOSIS — I5032 Chronic diastolic (congestive) heart failure: Secondary | ICD-10-CM | POA: Diagnosis not present

## 2021-08-12 DIAGNOSIS — Z4801 Encounter for change or removal of surgical wound dressing: Secondary | ICD-10-CM | POA: Diagnosis not present

## 2021-08-13 ENCOUNTER — Ambulatory Visit (INDEPENDENT_AMBULATORY_CARE_PROVIDER_SITE_OTHER): Payer: Medicare Other | Admitting: Orthopedic Surgery

## 2021-08-13 DIAGNOSIS — Z945 Skin transplant status: Secondary | ICD-10-CM

## 2021-08-13 MED ORDER — HYDROXYZINE PAMOATE 100 MG PO CAPS: 100.0000 mg | ORAL_CAPSULE | Freq: Three times a day (TID) | ORAL | 0 refills | Status: DC | PRN

## 2021-08-13 MED ORDER — HYDROCODONE-ACETAMINOPHEN 5-325 MG PO TABS: 1.0000 | ORAL_TABLET | Freq: Four times a day (QID) | ORAL | 0 refills | Status: DC | PRN

## 2021-08-14 ENCOUNTER — Encounter: Payer: Self-pay | Admitting: Orthopedic Surgery

## 2021-08-14 NOTE — Progress Notes (Signed)
Office Visit Note   Patient: Robert Dickerson           Date of Birth: Nov 14, 1940           MRN: [id number] Visit Date: 08/13/2021              Requested by: Lavone Orn, MD [address] [place] [address] [place],  [place] [address] PCP: Lavone Orn, MD  Chief Complaint  Patient presents with   Right Leg - Routine Post Op    07/03/21 RLE debridement Kerecis graft       HPI: Patient is a 81 year old gentleman status post debridement and application of Kerecis skin graft right leg patient currently ambulates with a cane he has been using the knee-high compression stocking.  Patient feels like the wound is decreasing in size and he is making good progress.  Assessment & Plan: Visit Diagnoses:  1. History of skin graft     Plan: Recommended coconut water for the leg spasms, compression wrap applied today.  Follow-Up Instructions: Return in about 4 weeks (around 09/10/2021).   Ortho Exam  Patient is alert, oriented, no adenopathy, well-dressed, normal affect, normal respiratory effort. Examination there is some fibrinous exudative tissue over the granulating bed.  There is no cellulitis no odor no drainage.  Discussed the importance of washing with soap and water to debride the fibrinous tissue.  Imaging: No results found.     Labs: Lab Results  Component Value Date   HGBA1C 7.6 (H) 05/19/2021   HGBA1C 9.5 (H) 12/23/2020   LABURIC 6.4 03/04/2011   REPTSTATUS 07/06/2021 FINAL 07/01/2021   GRAMSTAIN NO WBC SEEN NO ORGANISMS SEEN  07/01/2021   CULT  07/01/2021    FEW STAPHYLOCOCCUS AUREUS RARE PROTEUS MIRABILIS NO ANAEROBES ISOLATED Performed at [facility], [place] [address], [place], [place] [address]    [place] 07/01/2021   LABORGA PROTEUS MIRABILIS 07/01/2021     Lab Results  Component Value Date   ALBUMIN 2.6 (L) 06/05/2021   ALBUMIN 3.2 (L) 05/18/2021   ALBUMIN 3.5 (L) 12/15/2015    Lab Results  Component Value Date   MG 2.2  05/19/2021   MG 2.0 12/23/2020   MG 2.0 12/23/2020   No results found for: VD25OH  No results found for: PREALBUMIN CBC EXTENDED Latest Ref Rng & Units 07/01/2021 06/05/2021 05/21/2021  WBC 4.0 - 10.5 K/uL - 10.0 11.2(H)  RBC 4.22 - 5.81 MIL/uL - 4.32 4.02(L)  HGB 13.0 - 17.0 g/dL 13.6 12.5(L) 11.8(L)  HCT 39.0 - 52.0 % 40.0 37.1(L) 35.5(L)  PLT 150 - 400 K/uL - 492(H) 317  NEUTROABS 1.7 - 7.7 K/uL - 8.0(H) -  LYMPHSABS 0.7 - 4.0 K/uL - 1.0 -     There is no height or weight on file to calculate BMI.  Orders:  No orders of the defined types were placed in this encounter.  Meds ordered this encounter  Medications   HYDROcodone-acetaminophen (NORCO/VICODIN) 5-325 MG tablet    Sig: Take 1 tablet by mouth every 6 (six) hours as needed for moderate pain ((score 4 to 6)).    Dispense:  30 tablet    Refill:  0   hydrOXYzine (VISTARIL) 100 MG capsule    Sig: Take 1 capsule (100 mg total) by mouth 3 (three) times daily as needed for itching.    Dispense:  30 capsule    Refill:  0     Procedures: No procedures performed  Clinical Data: No additional  findings.  ROS:  All other systems negative, except as noted in the HPI. Review of Systems  Objective: Vital Signs: There were no vitals taken for this visit.  Specialty Comments:  No specialty comments available.  PMFS History: Patient Active Problem List   Diagnosis Date Noted   Wound of right leg, sequela 07/01/2021   Nail, injury by 06/10/2021   Laceration of leg 05/20/2021   Acute respiratory failure with hypoxia ([place]) 05/18/2021   Chronic diastolic CHF (congestive heart failure) ([place]) 05/18/2021   Chronic kidney disease (CKD), stage IV (severe) ([place]) 05/18/2021   Hypertension associated with diabetes ([place]) 05/18/2021   Insulin dependent type 2 diabetes mellitus ([place]) 05/18/2021   OSA on CPAP 05/18/2021   Laceration of right lower extremity 05/18/2021   Acute kidney injury superimposed on CKD ([place]) 05/18/2021    Current chronic use of systemic steroids 01/08/2021   Preoperative cardiovascular examination 01/08/2021   Cellulitis 12/23/2020   Sepsis ([place]) 12/23/2020   Acute kidney injury superimposed on chronic kidney disease ([place]) 12/23/2020   Hypokalemia 12/23/2020   Benign neoplasm of duodenum, jejunum, 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   Chronic kidney disease, stage 4 (severe) ([place]) 12/03/2020   Degeneration of lumbar intervertebral disc 12/03/2020   Type 2 diabetes mellitus, uncontrolled, with neuropathy 12/03/2020   Diabetic peripheral neuropathy associated with type 2 diabetes mellitus ([place]) 00/37/0488   Diastolic heart failure ([place]) 12/03/2020   Enlarged prostate 12/03/2020   Erectile dysfunction due to arterial insufficiency 12/03/2020   Essential hypertension 12/03/2020   Gastro-esophageal reflux disease without esophagitis 12/03/2020   Hematuria 12/03/2020   Impaired fasting glucose 12/03/2020   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   Morbid obesity ([place]) 12/03/2020   Obstructive sleep apnea 12/03/2020   Osteoarthritis of hip 12/03/2020   Osteoarthritis of knee 12/03/2020   Polymyalgia rheumatica ([place]) 12/03/2020   Restless legs 12/03/2020   Skin sensation disturbance 12/03/2020   Body mass index (BMI) 29.0-29.9, adult 11/21/2020   Lumbar stenosis 11/21/2020   Tendinitis 09/03/2020   Subacute arthropathy 09/03/2020   Bilateral hearing loss 08/11/2020   Impacted cerumen of left ear 03/10/2017   Excessive cerumen in left ear canal 03/10/2017   Faintness 12/09/2015   Backache 11/14/2014   CHRONIC PANCREATITIS 06/16/2009   Past Medical History:  Diagnosis Date   Arthritis    all joints   Chronic diastolic CHF (congestive heart failure) (HCC)    Chronic kidney disease (CKD), stage IV (severe) (HCC)    Chronic kidney disease, stage 3 (HCC)    Congestive heart failure with LV diastolic dysfunction, NYHA class 2 ([place])    Family  history of adverse reaction to anesthesia    father- change in personaility   GERD (gastroesophageal reflux disease)    Hypertension    Hypertension associated with diabetes ([place])    Hypertensive chronic kidney disease    Insulin dependent type 2 diabetes mellitus (HCC)    Neuropathy    OSA on CPAP    Sleep apnea    uses cpap, pt does not know settings    Family History  Problem Relation Age of Onset   Hypertension Mother    Anesthesia problems Neg Hx    Hypotension Neg Hx    Malignant hyperthermia Neg Hx    Pseudochol deficiency Neg Hx     Past Surgical History:  Procedure Laterality Date   ANTERIOR LAT LUMBAR FUSION Left 01/22/2021   Procedure: Lumbar one-Lumbar two Lateral Lumbar Interbody Fusion;  Surgeon: Vallarie Mare, MD;  Location: [facility];  Service: Neurosurgery;  Laterality: Left;   ANTERIOR LAT LUMBAR FUSION  01/22/2021   APPENDECTOMY     BACK SURGERY     4 surg, lower   CHOLECYSTECTOMY     COLONOSCOPY WITH PROPOFOL N/A 06/11/2014   Procedure: COLONOSCOPY WITH PROPOFOL;  Surgeon: Garlan Fair, MD;  [facility];  Service: Endoscopy;  Laterality: N/A;   ESOPHAGOGASTRODUODENOSCOPY  08/26/2011   Procedure: ESOPHAGOGASTRODUODENOSCOPY (EGD);  Surgeon: Garlan Fair, MD;  [facility];  Service: Endoscopy;  Laterality: N/A;   ESOPHAGOGASTRODUODENOSCOPY (EGD) WITH PROPOFOL N/A 06/11/2014   Procedure: ESOPHAGOGASTRODUODENOSCOPY (EGD) WITH PROPOFOL;  Surgeon: Garlan Fair, MD;  [facility];  Service: Endoscopy;  Laterality: N/A;   EUS  09/15/2011   Procedure: UPPER ENDOSCOPIC ULTRASOUND (EUS) LINEAR;  Surgeon: Landry Dyke, MD;  [facility];  Service: Endoscopy;  Laterality: N/A;  MAC   I & D EXTREMITY Right 05/20/2021   Procedure: IRRIGATION AND DEBRIDEMENT OF LEG AND APPLICATION OF SKIN GRAFT;  Surgeon: Newt Minion, MD;  Location: [facility];  Service: Orthopedics;  Laterality: Right;   I & D EXTREMITY Right 07/03/2021    Procedure: REPEAT RIGHT LEG DEBRIDEMENT;  Surgeon: Newt Minion, MD;  Location: [facility];  Service: Orthopedics;  Laterality: Right;   I & D EXTREMITY Right 07/01/2021   Procedure: RIGHT LEG DEBRIDEMENT;  Surgeon: Newt Minion, MD;  Location: [facility];  Service: Orthopedics;  Laterality: Right;   KNEE ARTHROSCOPY Right YEARS AGO   Social History   Occupational History   Not on file  Tobacco Use   Smoking status: Former    Packs/day: 2.00    Years: 20.00    Pack years: 40.00    Types: Cigarettes    Quit date: 33    Years since quitting: 39.0   Smokeless tobacco: Never  Vaping Use   Vaping Use: Never used  Substance and Sexual Activity   Alcohol use: Not Currently   Drug use: Never   Sexual activity: Not on file

## 2021-08-19 ENCOUNTER — Telehealth: Payer: Self-pay

## 2021-08-19 DIAGNOSIS — Z794 Long term (current) use of insulin: Secondary | ICD-10-CM | POA: Diagnosis not present

## 2021-08-19 DIAGNOSIS — Z0000 Encounter for general adult medical examination without abnormal findings: Secondary | ICD-10-CM | POA: Diagnosis not present

## 2021-08-19 DIAGNOSIS — E1140 Type 2 diabetes mellitus with diabetic neuropathy, unspecified: Secondary | ICD-10-CM | POA: Diagnosis not present

## 2021-08-19 DIAGNOSIS — I5030 Unspecified diastolic (congestive) heart failure: Secondary | ICD-10-CM | POA: Diagnosis not present

## 2021-08-19 DIAGNOSIS — M353 Polymyalgia rheumatica: Secondary | ICD-10-CM | POA: Diagnosis not present

## 2021-08-19 DIAGNOSIS — Z1389 Encounter for screening for other disorder: Secondary | ICD-10-CM | POA: Diagnosis not present

## 2021-08-19 DIAGNOSIS — L299 Pruritus, unspecified: Secondary | ICD-10-CM | POA: Diagnosis not present

## 2021-08-19 DIAGNOSIS — I499 Cardiac arrhythmia, unspecified: Secondary | ICD-10-CM | POA: Diagnosis not present

## 2021-08-19 DIAGNOSIS — I129 Hypertensive chronic kidney disease with stage 1 through stage 4 chronic kidney disease, or unspecified chronic kidney disease: Secondary | ICD-10-CM | POA: Diagnosis not present

## 2021-08-19 DIAGNOSIS — G2581 Restless legs syndrome: Secondary | ICD-10-CM | POA: Diagnosis not present

## 2021-08-19 DIAGNOSIS — N1832 Chronic kidney disease, stage 3b: Secondary | ICD-10-CM | POA: Diagnosis not present

## 2021-08-19 DIAGNOSIS — G4733 Obstructive sleep apnea (adult) (pediatric): Secondary | ICD-10-CM | POA: Diagnosis not present

## 2021-08-19 DIAGNOSIS — E1159 Type 2 diabetes mellitus with other circulatory complications: Secondary | ICD-10-CM | POA: Diagnosis not present

## 2021-08-19 DIAGNOSIS — M1A00X0 Idiopathic chronic gout, unspecified site, without tophus (tophi): Secondary | ICD-10-CM | POA: Diagnosis not present

## 2021-08-19 NOTE — Telephone Encounter (Signed)
Pt's wife Judeen Hammans called to get notes from pt's last office visit in 06/2021. Judeen Hammans stated they will pull it up through [facility]. No further questions/concerns at this time.

## 2021-08-20 DIAGNOSIS — N179 Acute kidney failure, unspecified: Secondary | ICD-10-CM | POA: Diagnosis not present

## 2021-08-20 DIAGNOSIS — E1122 Type 2 diabetes mellitus with diabetic chronic kidney disease: Secondary | ICD-10-CM | POA: Diagnosis not present

## 2021-08-20 DIAGNOSIS — Z4801 Encounter for change or removal of surgical wound dressing: Secondary | ICD-10-CM | POA: Diagnosis not present

## 2021-08-20 DIAGNOSIS — I5032 Chronic diastolic (congestive) heart failure: Secondary | ICD-10-CM | POA: Diagnosis not present

## 2021-08-20 DIAGNOSIS — N184 Chronic kidney disease, stage 4 (severe): Secondary | ICD-10-CM | POA: Diagnosis not present

## 2021-08-20 DIAGNOSIS — G894 Chronic pain syndrome: Secondary | ICD-10-CM | POA: Diagnosis not present

## 2021-08-21 DIAGNOSIS — N179 Acute kidney failure, unspecified: Secondary | ICD-10-CM | POA: Diagnosis not present

## 2021-08-24 ENCOUNTER — Inpatient Hospital Stay (HOSPITAL_COMMUNITY)
Admission: EM | Admit: 2021-08-24 | Discharge: 2021-08-28 | DRG: 291 | Disposition: A | Discharge status: Home Health | Payer: Medicare Other | Attending: Internal Medicine | Admitting: Internal Medicine

## 2021-08-24 ENCOUNTER — Emergency Department (HOSPITAL_COMMUNITY): Payer: Medicare Other

## 2021-08-24 ENCOUNTER — Other Ambulatory Visit: Payer: Self-pay

## 2021-08-24 DIAGNOSIS — Z981 Arthrodesis status: Secondary | ICD-10-CM | POA: Diagnosis not present

## 2021-08-24 DIAGNOSIS — I152 Hypertension secondary to endocrine disorders: Secondary | ICD-10-CM | POA: Diagnosis present

## 2021-08-24 DIAGNOSIS — R0689 Other abnormalities of breathing: Secondary | ICD-10-CM | POA: Diagnosis not present

## 2021-08-24 DIAGNOSIS — I4891 Unspecified atrial fibrillation: Secondary | ICD-10-CM

## 2021-08-24 DIAGNOSIS — I10 Essential (primary) hypertension: Secondary | ICD-10-CM | POA: Diagnosis not present

## 2021-08-24 DIAGNOSIS — I429 Cardiomyopathy, unspecified: Secondary | ICD-10-CM | POA: Diagnosis present

## 2021-08-24 DIAGNOSIS — I480 Paroxysmal atrial fibrillation: Secondary | ICD-10-CM | POA: Diagnosis not present

## 2021-08-24 DIAGNOSIS — E1122 Type 2 diabetes mellitus with diabetic chronic kidney disease: Secondary | ICD-10-CM | POA: Diagnosis not present

## 2021-08-24 DIAGNOSIS — I110 Hypertensive heart disease with heart failure: Secondary | ICD-10-CM | POA: Diagnosis not present

## 2021-08-24 DIAGNOSIS — E1142 Type 2 diabetes mellitus with diabetic polyneuropathy: Secondary | ICD-10-CM | POA: Diagnosis present

## 2021-08-24 DIAGNOSIS — R079 Chest pain, unspecified: Secondary | ICD-10-CM | POA: Diagnosis not present

## 2021-08-24 DIAGNOSIS — Z8249 Family history of ischemic heart disease and other diseases of the circulatory system: Secondary | ICD-10-CM | POA: Diagnosis not present

## 2021-08-24 DIAGNOSIS — Z6834 Body mass index (BMI) 34.0-34.9, adult: Secondary | ICD-10-CM

## 2021-08-24 DIAGNOSIS — N179 Acute kidney failure, unspecified: Secondary | ICD-10-CM | POA: Diagnosis not present

## 2021-08-24 DIAGNOSIS — I5033 Acute on chronic diastolic (congestive) heart failure: Secondary | ICD-10-CM

## 2021-08-24 DIAGNOSIS — R069 Unspecified abnormalities of breathing: Secondary | ICD-10-CM | POA: Diagnosis not present

## 2021-08-24 DIAGNOSIS — I130 Hypertensive heart and chronic kidney disease with heart failure and stage 1 through stage 4 chronic kidney disease, or unspecified chronic kidney disease: Secondary | ICD-10-CM | POA: Diagnosis not present

## 2021-08-24 DIAGNOSIS — M353 Polymyalgia rheumatica: Secondary | ICD-10-CM | POA: Diagnosis present

## 2021-08-24 DIAGNOSIS — Z20822 Contact with and (suspected) exposure to covid-19: Secondary | ICD-10-CM | POA: Diagnosis present

## 2021-08-24 DIAGNOSIS — J810 Acute pulmonary edema: Secondary | ICD-10-CM | POA: Diagnosis not present

## 2021-08-24 DIAGNOSIS — I5030 Unspecified diastolic (congestive) heart failure: Secondary | ICD-10-CM | POA: Diagnosis not present

## 2021-08-24 DIAGNOSIS — N184 Chronic kidney disease, stage 4 (severe): Secondary | ICD-10-CM | POA: Diagnosis present

## 2021-08-24 DIAGNOSIS — I509 Heart failure, unspecified: Secondary | ICD-10-CM | POA: Diagnosis not present

## 2021-08-24 DIAGNOSIS — E11622 Type 2 diabetes mellitus with other skin ulcer: Secondary | ICD-10-CM | POA: Diagnosis present

## 2021-08-24 DIAGNOSIS — K219 Gastro-esophageal reflux disease without esophagitis: Secondary | ICD-10-CM | POA: Diagnosis present

## 2021-08-24 DIAGNOSIS — L97919 Non-pressure chronic ulcer of unspecified part of right lower leg with unspecified severity: Secondary | ICD-10-CM | POA: Diagnosis present

## 2021-08-24 DIAGNOSIS — E8779 Other fluid overload: Secondary | ICD-10-CM | POA: Diagnosis not present

## 2021-08-24 DIAGNOSIS — R0602 Shortness of breath: Secondary | ICD-10-CM | POA: Diagnosis not present

## 2021-08-24 DIAGNOSIS — Z794 Long term (current) use of insulin: Secondary | ICD-10-CM

## 2021-08-24 DIAGNOSIS — Z7952 Long term (current) use of systemic steroids: Secondary | ICD-10-CM | POA: Diagnosis not present

## 2021-08-24 DIAGNOSIS — I25119 Atherosclerotic heart disease of native coronary artery with unspecified angina pectoris: Secondary | ICD-10-CM | POA: Diagnosis present

## 2021-08-24 DIAGNOSIS — R0600 Dyspnea, unspecified: Secondary | ICD-10-CM | POA: Diagnosis not present

## 2021-08-24 DIAGNOSIS — J9601 Acute respiratory failure with hypoxia: Secondary | ICD-10-CM | POA: Diagnosis not present

## 2021-08-24 DIAGNOSIS — G4733 Obstructive sleep apnea (adult) (pediatric): Secondary | ICD-10-CM | POA: Diagnosis present

## 2021-08-24 DIAGNOSIS — E1165 Type 2 diabetes mellitus with hyperglycemia: Secondary | ICD-10-CM | POA: Diagnosis present

## 2021-08-24 DIAGNOSIS — E876 Hypokalemia: Secondary | ICD-10-CM | POA: Diagnosis present

## 2021-08-24 DIAGNOSIS — I700 Atherosclerosis of aorta: Secondary | ICD-10-CM | POA: Diagnosis present

## 2021-08-24 DIAGNOSIS — I5043 Acute on chronic combined systolic (congestive) and diastolic (congestive) heart failure: Secondary | ICD-10-CM | POA: Diagnosis present

## 2021-08-24 DIAGNOSIS — Z79899 Other long term (current) drug therapy: Secondary | ICD-10-CM

## 2021-08-24 DIAGNOSIS — D6489 Other specified anemias: Secondary | ICD-10-CM | POA: Diagnosis present

## 2021-08-24 DIAGNOSIS — Z87891 Personal history of nicotine dependence: Secondary | ICD-10-CM

## 2021-08-24 DIAGNOSIS — E1169 Type 2 diabetes mellitus with other specified complication: Secondary | ICD-10-CM | POA: Diagnosis present

## 2021-08-24 DIAGNOSIS — R0902 Hypoxemia: Secondary | ICD-10-CM | POA: Diagnosis not present

## 2021-08-24 DIAGNOSIS — S81801S Unspecified open wound, right lower leg, sequela: Secondary | ICD-10-CM

## 2021-08-24 DIAGNOSIS — R0789 Other chest pain: Secondary | ICD-10-CM | POA: Diagnosis not present

## 2021-08-24 DIAGNOSIS — I517 Cardiomegaly: Secondary | ICD-10-CM | POA: Diagnosis not present

## 2021-08-24 DIAGNOSIS — D649 Anemia, unspecified: Secondary | ICD-10-CM | POA: Diagnosis not present

## 2021-08-24 DIAGNOSIS — Z9989 Dependence on other enabling machines and devices: Secondary | ICD-10-CM | POA: Diagnosis not present

## 2021-08-24 DIAGNOSIS — Z7984 Long term (current) use of oral hypoglycemic drugs: Secondary | ICD-10-CM

## 2021-08-25 ENCOUNTER — Inpatient Hospital Stay (HOSPITAL_COMMUNITY): Payer: Medicare Other

## 2021-08-25 ENCOUNTER — Encounter (HOSPITAL_COMMUNITY): Payer: Self-pay | Admitting: Internal Medicine

## 2021-08-25 ENCOUNTER — Other Ambulatory Visit (HOSPITAL_COMMUNITY): Payer: Self-pay

## 2021-08-25 DIAGNOSIS — I429 Cardiomyopathy, unspecified: Secondary | ICD-10-CM | POA: Diagnosis present

## 2021-08-25 DIAGNOSIS — J9601 Acute respiratory failure with hypoxia: Secondary | ICD-10-CM | POA: Diagnosis present

## 2021-08-25 DIAGNOSIS — E1122 Type 2 diabetes mellitus with diabetic chronic kidney disease: Secondary | ICD-10-CM

## 2021-08-25 DIAGNOSIS — I4891 Unspecified atrial fibrillation: Secondary | ICD-10-CM

## 2021-08-25 DIAGNOSIS — I10 Essential (primary) hypertension: Secondary | ICD-10-CM

## 2021-08-25 DIAGNOSIS — M353 Polymyalgia rheumatica: Secondary | ICD-10-CM | POA: Diagnosis present

## 2021-08-25 DIAGNOSIS — Z981 Arthrodesis status: Secondary | ICD-10-CM | POA: Diagnosis not present

## 2021-08-25 DIAGNOSIS — E1142 Type 2 diabetes mellitus with diabetic polyneuropathy: Secondary | ICD-10-CM | POA: Diagnosis present

## 2021-08-25 DIAGNOSIS — E1169 Type 2 diabetes mellitus with other specified complication: Secondary | ICD-10-CM | POA: Diagnosis present

## 2021-08-25 DIAGNOSIS — E8779 Other fluid overload: Secondary | ICD-10-CM | POA: Diagnosis not present

## 2021-08-25 DIAGNOSIS — Z7952 Long term (current) use of systemic steroids: Secondary | ICD-10-CM | POA: Diagnosis not present

## 2021-08-25 DIAGNOSIS — R0602 Shortness of breath: Secondary | ICD-10-CM | POA: Diagnosis present

## 2021-08-25 DIAGNOSIS — I5033 Acute on chronic diastolic (congestive) heart failure: Secondary | ICD-10-CM | POA: Diagnosis not present

## 2021-08-25 DIAGNOSIS — K219 Gastro-esophageal reflux disease without esophagitis: Secondary | ICD-10-CM | POA: Diagnosis present

## 2021-08-25 DIAGNOSIS — I130 Hypertensive heart and chronic kidney disease with heart failure and stage 1 through stage 4 chronic kidney disease, or unspecified chronic kidney disease: Secondary | ICD-10-CM | POA: Diagnosis present

## 2021-08-25 DIAGNOSIS — G4733 Obstructive sleep apnea (adult) (pediatric): Secondary | ICD-10-CM | POA: Diagnosis present

## 2021-08-25 DIAGNOSIS — N184 Chronic kidney disease, stage 4 (severe): Secondary | ICD-10-CM

## 2021-08-25 DIAGNOSIS — I700 Atherosclerosis of aorta: Secondary | ICD-10-CM | POA: Diagnosis present

## 2021-08-25 DIAGNOSIS — Z9989 Dependence on other enabling machines and devices: Secondary | ICD-10-CM | POA: Diagnosis not present

## 2021-08-25 DIAGNOSIS — Z87891 Personal history of nicotine dependence: Secondary | ICD-10-CM | POA: Diagnosis not present

## 2021-08-25 DIAGNOSIS — Z20822 Contact with and (suspected) exposure to covid-19: Secondary | ICD-10-CM | POA: Diagnosis present

## 2021-08-25 DIAGNOSIS — D649 Anemia, unspecified: Secondary | ICD-10-CM | POA: Diagnosis not present

## 2021-08-25 DIAGNOSIS — I5043 Acute on chronic combined systolic (congestive) and diastolic (congestive) heart failure: Secondary | ICD-10-CM | POA: Diagnosis present

## 2021-08-25 DIAGNOSIS — D6489 Other specified anemias: Secondary | ICD-10-CM | POA: Diagnosis present

## 2021-08-25 DIAGNOSIS — I5030 Unspecified diastolic (congestive) heart failure: Secondary | ICD-10-CM | POA: Diagnosis not present

## 2021-08-25 DIAGNOSIS — Z794 Long term (current) use of insulin: Secondary | ICD-10-CM | POA: Diagnosis not present

## 2021-08-25 DIAGNOSIS — N179 Acute kidney failure, unspecified: Secondary | ICD-10-CM | POA: Diagnosis present

## 2021-08-25 DIAGNOSIS — L97919 Non-pressure chronic ulcer of unspecified part of right lower leg with unspecified severity: Secondary | ICD-10-CM | POA: Diagnosis present

## 2021-08-25 DIAGNOSIS — I152 Hypertension secondary to endocrine disorders: Secondary | ICD-10-CM | POA: Diagnosis present

## 2021-08-25 DIAGNOSIS — Z8249 Family history of ischemic heart disease and other diseases of the circulatory system: Secondary | ICD-10-CM | POA: Diagnosis not present

## 2021-08-25 DIAGNOSIS — J810 Acute pulmonary edema: Secondary | ICD-10-CM | POA: Diagnosis not present

## 2021-08-25 DIAGNOSIS — E1165 Type 2 diabetes mellitus with hyperglycemia: Secondary | ICD-10-CM | POA: Diagnosis present

## 2021-08-25 LAB — CBC WITH DIFFERENTIAL/PLATELET
Abs Immature Granulocytes: 0.21 10*3/uL — ABNORMAL HIGH (ref 0.00–0.07)
Abs Immature Granulocytes: 0.24 10*3/uL — ABNORMAL HIGH (ref 0.00–0.07)
Basophils Absolute: 0.1 10*3/uL (ref 0.0–0.1)
Basophils Absolute: 0.1 10*3/uL (ref 0.0–0.1)
Basophils Relative: 1 %
Basophils Relative: 1 %
Eosinophils Absolute: 0.2 10*3/uL (ref 0.0–0.5)
Eosinophils Absolute: 0.2 10*3/uL (ref 0.0–0.5)
Eosinophils Relative: 1 %
Eosinophils Relative: 1 %
HCT: 34.6 % — ABNORMAL LOW (ref 39.0–52.0)
HCT: 38.1 % — ABNORMAL LOW (ref 39.0–52.0)
Hemoglobin: 11.5 g/dL — ABNORMAL LOW (ref 13.0–17.0)
Hemoglobin: 12.6 g/dL — ABNORMAL LOW (ref 13.0–17.0)
Immature Granulocytes: 1 %
Immature Granulocytes: 1 %
Lymphocytes Relative: 4 %
Lymphocytes Relative: 6 %
Lymphs Abs: 0.7 10*3/uL (ref 0.7–4.0)
Lymphs Abs: 1 10*3/uL (ref 0.7–4.0)
MCH: 28.5 pg (ref 26.0–34.0)
MCH: 28.5 pg (ref 26.0–34.0)
MCHC: 33.1 g/dL (ref 30.0–36.0)
MCHC: 33.2 g/dL (ref 30.0–36.0)
MCV: 85.9 fL (ref 80.0–100.0)
MCV: 86.2 fL (ref 80.0–100.0)
Monocytes Absolute: 1.4 10*3/uL — ABNORMAL HIGH (ref 0.1–1.0)
Monocytes Absolute: 1.6 10*3/uL — ABNORMAL HIGH (ref 0.1–1.0)
Monocytes Relative: 8 %
Monocytes Relative: 9 %
Neutro Abs: 13.5 10*3/uL — ABNORMAL HIGH (ref 1.7–7.7)
Neutro Abs: 14.5 10*3/uL — ABNORMAL HIGH (ref 1.7–7.7)
Neutrophils Relative %: 82 %
Neutrophils Relative %: 85 %
Platelets: 324 10*3/uL (ref 150–400)
Platelets: 342 10*3/uL (ref 150–400)
RBC: 4.03 MIL/uL — ABNORMAL LOW (ref 4.22–5.81)
RBC: 4.42 MIL/uL (ref 4.22–5.81)
RDW: 15.9 % — ABNORMAL HIGH (ref 11.5–15.5)
RDW: 16 % — ABNORMAL HIGH (ref 11.5–15.5)
WBC: 16.6 10*3/uL — ABNORMAL HIGH (ref 4.0–10.5)
WBC: 17.1 10*3/uL — ABNORMAL HIGH (ref 4.0–10.5)
nRBC: 0 % (ref 0.0–0.2)
nRBC: 0 % (ref 0.0–0.2)

## 2021-08-25 LAB — HEMOGLOBIN A1C
Hgb A1c MFr Bld: 8.2 % — ABNORMAL HIGH (ref 4.8–5.6)
Mean Plasma Glucose: 189 mg/dL

## 2021-08-25 LAB — I-STAT CHEM 8, ED
BUN: 55 mg/dL — ABNORMAL HIGH (ref 8–23)
Calcium, Ion: 1.03 mmol/L — ABNORMAL LOW (ref 1.15–1.40)
Chloride: 95 mmol/L — ABNORMAL LOW (ref 98–111)
Creatinine, Ser: 2.4 mg/dL — ABNORMAL HIGH (ref 0.61–1.24)
Glucose, Bld: 227 mg/dL — ABNORMAL HIGH (ref 70–99)
HCT: 37 % — ABNORMAL LOW (ref 39.0–52.0)
Hemoglobin: 12.6 g/dL — ABNORMAL LOW (ref 13.0–17.0)
Potassium: 3.7 mmol/L (ref 3.5–5.1)
Sodium: 132 mmol/L — ABNORMAL LOW (ref 135–145)
TCO2: 30 mmol/L (ref 22–32)

## 2021-08-25 LAB — CBG MONITORING, ED
Glucose-Capillary: 203 mg/dL — ABNORMAL HIGH (ref 70–99)
Glucose-Capillary: 178 mg/dL — ABNORMAL HIGH (ref 70–99)
Glucose-Capillary: 185 mg/dL — ABNORMAL HIGH (ref 70–99)
Glucose-Capillary: 140 mg/dL — ABNORMAL HIGH (ref 70–99)

## 2021-08-25 LAB — COMPREHENSIVE METABOLIC PANEL
ALT: 16 U/L (ref 0–44)
AST: 21 U/L (ref 15–41)
Albumin: 2.5 g/dL — ABNORMAL LOW (ref 3.5–5.0)
Alkaline Phosphatase: 112 U/L (ref 38–126)
Anion gap: 10 (ref 5–15)
BUN: 54 mg/dL — ABNORMAL HIGH (ref 8–23)
CO2: 27 mmol/L (ref 22–32)
Calcium: 7.8 mg/dL — ABNORMAL LOW (ref 8.9–10.3)
Chloride: 97 mmol/L — ABNORMAL LOW (ref 98–111)
Creatinine, Ser: 2.43 mg/dL — ABNORMAL HIGH (ref 0.61–1.24)
GFR, Estimated: 26 mL/min — ABNORMAL LOW (ref >60)
Glucose, Bld: 213 mg/dL — ABNORMAL HIGH (ref 70–99)
Potassium: 3.8 mmol/L (ref 3.5–5.1)
Sodium: 134 mmol/L — ABNORMAL LOW (ref 135–145)
Total Bilirubin: 1.4 mg/dL — ABNORMAL HIGH (ref 0.3–1.2)
Total Protein: 5.6 g/dL — ABNORMAL LOW (ref 6.5–8.1)

## 2021-08-25 LAB — MAGNESIUM: Magnesium: 2.2 mg/dL (ref 1.7–2.4)

## 2021-08-25 LAB — I-STAT VENOUS BLOOD GAS, ED
Acid-Base Excess: 7 mmol/L — ABNORMAL HIGH (ref 0.0–2.0)
Bicarbonate: 31.2 mmol/L — ABNORMAL HIGH (ref 20.0–28.0)
Calcium, Ion: 1.02 mmol/L — ABNORMAL LOW (ref 1.15–1.40)
HCT: 38 % — ABNORMAL LOW (ref 39.0–52.0)
Hemoglobin: 12.9 g/dL — ABNORMAL LOW (ref 13.0–17.0)
O2 Saturation: 96 %
Potassium: 3.7 mmol/L (ref 3.5–5.1)
Sodium: 132 mmol/L — ABNORMAL LOW (ref 135–145)
TCO2: 32 mmol/L (ref 22–32)
pCO2, Ven: 41.1 mmHg — ABNORMAL LOW (ref 44.0–60.0)
pH, Ven: 7.488 — ABNORMAL HIGH (ref 7.250–7.430)
pO2, Ven: 78 mmHg — ABNORMAL HIGH (ref 32.0–45.0)

## 2021-08-25 LAB — BASIC METABOLIC PANEL
Anion gap: 12 (ref 5–15)
BUN: 57 mg/dL — ABNORMAL HIGH (ref 8–23)
CO2: 25 mmol/L (ref 22–32)
Calcium: 8.3 mg/dL — ABNORMAL LOW (ref 8.9–10.3)
Chloride: 94 mmol/L — ABNORMAL LOW (ref 98–111)
Creatinine, Ser: 2.46 mg/dL — ABNORMAL HIGH (ref 0.61–1.24)
GFR, Estimated: 26 mL/min — ABNORMAL LOW (ref >60)
Glucose, Bld: 228 mg/dL — ABNORMAL HIGH (ref 70–99)
Potassium: 3.7 mmol/L (ref 3.5–5.1)
Sodium: 131 mmol/L — ABNORMAL LOW (ref 135–145)

## 2021-08-25 LAB — TROPONIN I (HIGH SENSITIVITY)
Troponin I (High Sensitivity): 328 ng/L (ref <18)
Troponin I (High Sensitivity): 409 ng/L (ref <18)
Troponin I (High Sensitivity): 408 ng/L (ref <18)

## 2021-08-25 LAB — RESP PANEL BY RT-PCR (FLU A&B, COVID) ARPGX2
Influenza A by PCR: NEGATIVE
Influenza B by PCR: NEGATIVE
SARS Coronavirus 2 by RT PCR: NEGATIVE

## 2021-08-25 LAB — PROTIME-INR
INR: 1.3 — ABNORMAL HIGH (ref 0.8–1.2)
Prothrombin Time: 15.9 seconds — ABNORMAL HIGH (ref 11.4–15.2)

## 2021-08-25 LAB — ECHOCARDIOGRAM COMPLETE
AR max vel: 3.31 cm2
AV Peak grad: 3.2 mmHg
Ao pk vel: 0.89 m/s
Area-P 1/2: 3.79 cm2
Height: 68.25 in
S' Lateral: 5.1 cm
Weight: 3609.6 oz

## 2021-08-25 LAB — BRAIN NATRIURETIC PEPTIDE: B Natriuretic Peptide: 378.5 pg/mL — ABNORMAL HIGH (ref 0.0–100.0)

## 2021-08-25 LAB — HEPARIN LEVEL (UNFRACTIONATED)
Heparin Unfractionated: <0.1 IU/mL — ABNORMAL LOW (ref 0.30–0.70)
Heparin Unfractionated: 0.13 IU/mL — ABNORMAL LOW (ref 0.30–0.70)

## 2021-08-25 LAB — TSH: TSH: 4.104 u[IU]/mL (ref 0.350–4.500)

## 2021-08-25 MED ORDER — METOPROLOL TARTRATE 25 MG PO TABS
25.0000 mg | ORAL_TABLET | Freq: Two times a day (BID) | ORAL | Status: DC
  Administered 2021-08-25 – 2021-08-28 (×6): 25 mg via ORAL
  Filled 2021-08-25 (×7): qty 1

## 2021-08-25 MED ORDER — ACETAMINOPHEN 650 MG RE SUPP: 650.0000 mg | Freq: Four times a day (QID) | RECTAL | Status: DC | PRN

## 2021-08-25 MED ORDER — ONDANSETRON HCL 4 MG PO TABS: 4.0000 mg | ORAL_TABLET | Freq: Four times a day (QID) | ORAL | Status: DC | PRN

## 2021-08-25 MED ORDER — INSULIN GLARGINE 100 UNIT/ML ~~LOC~~ SOLN
10.0000 [IU] | Freq: Every day | SUBCUTANEOUS | Status: DC
  Filled 2021-08-25: qty 0.1

## 2021-08-25 MED ORDER — HEPARIN BOLUS VIA INFUSION
3000.0000 [IU] | Freq: Once | INTRAVENOUS | Status: AC
  Administered 2021-08-25: 02:00:00 3000 [IU] via INTRAVENOUS
  Filled 2021-08-25: qty 3000

## 2021-08-25 MED ORDER — METOPROLOL TARTRATE 5 MG/5ML IV SOLN: 2.5000 mg | Freq: Four times a day (QID) | INTRAVENOUS | Status: DC

## 2021-08-25 MED ORDER — FUROSEMIDE 10 MG/ML IJ SOLN
80.0000 mg | Freq: Two times a day (BID) | INTRAMUSCULAR | Status: DC
Start: 2021-08-25 — End: 2021-08-25

## 2021-08-25 MED ORDER — OXYCODONE HCL 5 MG PO TABS
5.0000 mg | ORAL_TABLET | ORAL | Status: DC | PRN
  Administered 2021-08-25 (×2): 5 mg via ORAL
  Filled 2021-08-25 (×2): qty 1

## 2021-08-25 MED ORDER — INSULIN ASPART 100 UNIT/ML IJ SOLN
0.0000 [IU] | Freq: Three times a day (TID) | INTRAMUSCULAR | Status: DC
  Administered 2021-08-25 (×2): 3 [IU] via SUBCUTANEOUS
  Administered 2021-08-25: 09:00:00 5 [IU] via SUBCUTANEOUS
  Administered 2021-08-26: 08:00:00 2 [IU] via SUBCUTANEOUS
  Administered 2021-08-26: 14:00:00 3 [IU] via SUBCUTANEOUS

## 2021-08-25 MED ORDER — INSULIN GLARGINE-YFGN 100 UNIT/ML ~~LOC~~ SOLN
10.0000 [IU] | Freq: Every day | SUBCUTANEOUS | Status: DC
  Administered 2021-08-25 – 2021-08-28 (×4): 10 [IU] via SUBCUTANEOUS
  Filled 2021-08-25 (×4): qty 0.1

## 2021-08-25 MED ORDER — HYDROXYZINE HCL 50 MG PO TABS: 100.0000 mg | ORAL_TABLET | Freq: Three times a day (TID) | ORAL | Status: DC | PRN

## 2021-08-25 MED ORDER — METOPROLOL TARTRATE 5 MG/5ML IV SOLN
2.5000 mg | Freq: Once | INTRAVENOUS | Status: AC
  Administered 2021-08-25: 02:00:00 2.5 mg via INTRAVENOUS
  Filled 2021-08-25: qty 5

## 2021-08-25 MED ORDER — HEPARIN BOLUS VIA INFUSION
1000.0000 [IU] | Freq: Once | INTRAVENOUS | Status: AC
  Administered 2021-08-25: 13:00:00 1000 [IU] via INTRAVENOUS
  Filled 2021-08-25: qty 1000

## 2021-08-25 MED ORDER — PREDNISONE 5 MG PO TABS
5.0000 mg | ORAL_TABLET | Freq: Every day | ORAL | Status: DC
  Administered 2021-08-25 – 2021-08-28 (×4): 5 mg via ORAL
  Filled 2021-08-25 (×5): qty 1

## 2021-08-25 MED ORDER — ACETAMINOPHEN 325 MG PO TABS
650.0000 mg | ORAL_TABLET | Freq: Four times a day (QID) | ORAL | Status: DC | PRN
  Administered 2021-08-25: 03:00:00 650 mg via ORAL
  Filled 2021-08-25: qty 2

## 2021-08-25 MED ORDER — FUROSEMIDE 10 MG/ML IJ SOLN
60.0000 mg | Freq: Once | INTRAMUSCULAR | Status: AC
Start: 2021-08-25 — End: 2021-08-25
  Administered 2021-08-25: 01:00:00 60 mg via INTRAVENOUS
  Filled 2021-08-25: qty 6

## 2021-08-25 MED ORDER — HEPARIN (PORCINE) 25000 UT/250ML-% IV SOLN
1500.0000 [IU]/h | INTRAVENOUS | Status: DC
  Administered 2021-08-25: 02:00:00 1500 [IU]/h via INTRAVENOUS
  Filled 2021-08-25: qty 250

## 2021-08-25 MED ORDER — HEPARIN (PORCINE) 25000 UT/250ML-% IV SOLN
2000.0000 [IU]/h | INTRAVENOUS | Status: DC
  Administered 2021-08-25: 17:00:00 1750 [IU]/h via INTRAVENOUS
  Administered 2021-08-27: 2000 [IU]/h via INTRAVENOUS
  Filled 2021-08-25 (×4): qty 250

## 2021-08-25 MED ORDER — INSULIN ASPART 100 UNIT/ML IJ SOLN: 0.0000 [IU] | Freq: Every day | INTRAMUSCULAR | Status: DC

## 2021-08-25 MED ORDER — METOPROLOL TARTRATE 25 MG PO TABS: 12.5000 mg | ORAL_TABLET | Freq: Two times a day (BID) | ORAL | Status: DC

## 2021-08-25 MED ORDER — ALLOPURINOL 300 MG PO TABS
150.0000 mg | ORAL_TABLET | Freq: Every day | ORAL | Status: DC
  Administered 2021-08-25 – 2021-08-27 (×3): 150 mg via ORAL
  Filled 2021-08-25: qty 2
  Filled 2021-08-25 (×2): qty 1

## 2021-08-25 MED ORDER — ONDANSETRON HCL 4 MG/2ML IJ SOLN: 4.0000 mg | Freq: Four times a day (QID) | INTRAMUSCULAR | Status: DC | PRN

## 2021-08-25 MED ORDER — ALBUTEROL SULFATE (2.5 MG/3ML) 0.083% IN NEBU
2.5000 mg | INHALATION_SOLUTION | RESPIRATORY_TRACT | Status: DC | PRN
  Administered 2021-08-25: 11:00:00 2.5 mg via RESPIRATORY_TRACT
  Filled 2021-08-25: qty 3

## 2021-08-25 MED ORDER — FUROSEMIDE 10 MG/ML IJ SOLN
80.0000 mg | Freq: Two times a day (BID) | INTRAMUSCULAR | Status: DC
  Administered 2021-08-25 – 2021-08-26 (×2): 80 mg via INTRAVENOUS
  Filled 2021-08-25 (×2): qty 8

## 2021-08-25 MED ORDER — FUROSEMIDE 10 MG/ML IJ SOLN
80.0000 mg | Freq: Two times a day (BID) | INTRAMUSCULAR | Status: DC
  Administered 2021-08-25: 09:00:00 80 mg via INTRAVENOUS
  Filled 2021-08-25: qty 8

## 2021-08-25 MED ORDER — HEPARIN BOLUS VIA INFUSION
2700.0000 [IU] | Freq: Once | INTRAVENOUS | Status: AC
  Administered 2021-08-25: 22:00:00 2700 [IU] via INTRAVENOUS
  Filled 2021-08-25: qty 2700

## 2021-08-25 MED ORDER — PREGABALIN 75 MG PO CAPS
75.0000 mg | ORAL_CAPSULE | Freq: Two times a day (BID) | ORAL | Status: DC
  Administered 2021-08-25 – 2021-08-28 (×7): 75 mg via ORAL
  Filled 2021-08-25 (×2): qty 1
  Filled 2021-08-25 (×2): qty 3
  Filled 2021-08-25: qty 1
  Filled 2021-08-25: qty 3
  Filled 2021-08-25: qty 1

## 2021-08-25 MED ORDER — AMLODIPINE BESYLATE 5 MG PO TABS
10.0000 mg | ORAL_TABLET | Freq: Every day | ORAL | Status: DC
Start: 2021-08-25 — End: 2021-08-25

## 2021-08-25 NOTE — Assessment & Plan Note (Signed)
Chronic. 

## 2021-08-25 NOTE — ED Notes (Signed)
CBG is 140.
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
## 2021-08-25 NOTE — Assessment & Plan Note (Signed)
New afib as of Wednesday's EKG at PCP office. Referral made to outpatient cards during PCP visit. Will need to see cardiology during this hospitalizations. Start IV heparin. May need TEE/DCCV. Check TSH.

## 2021-08-25 NOTE — Progress Notes (Signed)
[facility] for heparin Indication: atrial fibrillation  Allergies  Allergen Reactions   Oxycodone Hcl Other (See Comments)    Other reaction(s): "out of it" per pt   Ace Inhibitors Cough    Other reaction(s): cough   Lisinopril Cough   Oxycodone Other (See Comments)    "out of it"     Patient Measurements: Height: 5' 8.25" (173.4 cm) Weight: 102.3 kg (225 lb 9.6 oz) IBW/kg (Calculated) : 68.98 Heparin Dosing Weight: 90kg  Vital Signs: BP: 111/65 (01/24 1115) Pulse Rate: 71 (01/24 1115)  Labs: Recent Labs    08/24/21 2350 08/25/21 0004 08/25/21 0006 08/25/21 0205 08/25/21 0305 08/25/21 0905  HGB 12.6* 12.6* 12.9*  --  11.5*  --   HCT 38.1* 37.0* 38.0*  --  34.6*  --   PLT 342  --   --   --  324  --   LABPROT  --   --   --   --   --  15.9*  INR  --   --   --   --   --  1.3*  HEPARINUNFRC  --   --   --   --   --  <0.10*  CREATININE 2.46* 2.40*  --   --  2.43*  --   TROPONINIHS  --   --   --  328*  --   --      Estimated Creatinine Clearance: 28.7 mL/min (A) (by C-G formula based on SCr of 2.43 mg/dL (H)).   Medical History: Past Medical History:  Diagnosis Date   Arthritis    all joints   Chronic diastolic CHF (congestive heart failure) (HCC)    Chronic kidney disease (CKD), stage IV (severe) (HCC)    Chronic kidney disease, stage 3 (HCC)    Congestive heart failure with LV diastolic dysfunction, NYHA class 2 (HCC)    Family history of adverse reaction to anesthesia    father- change in personaility   GERD (gastroesophageal reflux disease)    Hypertension    Hypertension associated with diabetes ([place])    Hypertensive chronic kidney disease    Insulin dependent type 2 diabetes mellitus (HCC)    Neuropathy    OSA on CPAP    Sleep apnea    uses cpap, pt does not know settings    Assessment: 81yo male admitted for acute CHF and new Afib w/ hypoxia to begin heparin.  Initial heparin level came back undetectable  (<0.1), on 1500 units/hr. Hgb 11.5, plt 324. Trop 328. No s/sx of bleeding or infusion issues.   Goal of Therapy:  Heparin level 0.3-0.7 units/ml Monitor platelets by anticoagulation protocol: Yes   Plan:  Heparin 1000 units IV bolus x1 followed by increase in heparin infusion to 1700 units/hr  Order heparin level in 6 hr Monitor heparin levels and CBC daily.  Antonietta Jewel, PharmD, [facility] Clinical Pharmacist  Phone: [phone] 08/25/2021 12:43 PM  Please check AMION for all [facility] phone numbers After 10:00 PM, call [place] [phone]
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
## 2021-08-25 NOTE — ED Notes (Signed)
Breakfast orders placed 

## 2021-08-25 NOTE — Assessment & Plan Note (Signed)
Continue cpap.  

## 2021-08-25 NOTE — Assessment & Plan Note (Signed)
Chronic. Monitor Scr during diuresis. 

## 2021-08-25 NOTE — TOC Benefit Eligibility Note (Signed)
Patient Teacher, English as a foreign language completed.    The patient is currently admitted and upon discharge could be taking Xarelto 15 mg.  The current 30 day co-pay is, $47.00.   The patient is currently admitted and upon discharge could be taking Eliquis 5 mg.  The current 30 day co-pay is, $47.00.   The patient is insured through [place], [place] Patient Advocate Specialist [place] Patient Advocate Team Direct Number: [phone]  Fax: [phone]

## 2021-08-25 NOTE — Progress Notes (Signed)
ANTICOAGULATION CONSULT NOTE - Initial Consult  Pharmacy Consult for heparin Indication: atrial fibrillation  Allergies  Allergen Reactions   Oxycodone Hcl Other (See Comments)    Other reaction(s): "out of it" per pt   Ace Inhibitors Cough    Other reaction(s): cough   Lisinopril Cough   Oxycodone Other (See Comments)    "out of it"     Patient Measurements: Height: 5' 8.25" (173.4 cm) Weight: 102.3 kg (225 lb 9.6 oz) IBW/kg (Calculated) : 68.98 Heparin Dosing Weight: 90kg  Vital Signs: Temp: 98.2 F (36.8 C) (01/23 2349) Temp Source: Oral (01/23 2349) BP: 129/72 (01/24 0100) Pulse Rate: 94 (01/24 0100)  Labs: Recent Labs    08/24/21 2350 08/25/21 0004 08/25/21 0006  HGB 12.6* 12.6* 12.9*  HCT 38.1* 37.0* 38.0*  PLT 342  --   --   CREATININE 2.46* 2.40*  --     Estimated Creatinine Clearance: 29.1 mL/min (A) (by C-G formula based on SCr of 2.4 mg/dL (H)).   Medical History: Past Medical History:  Diagnosis Date   Arthritis    all joints   Chronic diastolic CHF (congestive heart failure) (HCC)    Chronic kidney disease (CKD), stage IV (severe) (HCC)    Chronic kidney disease, stage 3 (HCC)    Congestive heart failure with LV diastolic dysfunction, NYHA class 2 (HCC)    Family history of adverse reaction to anesthesia    father- change in personaility   GERD (gastroesophageal reflux disease)    Hypertension    Hypertension associated with diabetes ([place])    Hypertensive chronic kidney disease    Insulin dependent type 2 diabetes mellitus (HCC)    Neuropathy    OSA on CPAP    Sleep apnea    uses cpap, pt does not know settings    Assessment: 81yo male admitted for acute CHF and new Afib w/ hypoxia to begin heparin.  Goal of Therapy:  Heparin level 0.3-0.7 units/ml Monitor platelets by anticoagulation protocol: Yes   Plan:  Heparin 3000 units IV bolus x1 followed by infusion at 1500 units/hr and monitor heparin levels and CBC.  Wynona Neat,  PharmD, BCPS  08/25/2021,1:22 AM

## 2021-08-25 NOTE — Assessment & Plan Note (Signed)
Initially on NIV. Weaned to supplemental O2. Does not wear home O2.

## 2021-08-25 NOTE — Subjective & Objective (Signed)
CC: SOB HPI: 81 year old male with a history of type 2 diabetes on insulin, CKD stage IV, hypertension, prior history of diastolic heart failure, OSA on CPAP who presents today with worsening shortness of breath for the last 2 to 3 days.  Patient seen by his PCP on Wednesday of last week.  Was told he had atrial fibrillation.  Outpatient cardiology referral made.  He followed up with his PCP this past Friday.  He had been told to stop his diuretics due to worsening kidney failure.  On his Friday labs, his serum creatinine slightly improved.  Over the last 2 days, he has noticed increasing shortness of breath.  He has been having some PND.  He is only used 1 pillow at bedtime.  He has noticed increasing leg swelling.  He had some chest pain associate with shortness of breath.  This evening, shortness of breath became severe.  He called 911.  He presented to the ER in respiratory extremis.  An IV was placed on by EMS.  On arrival, heart rate 107.  EKG showed rapid A. fib with a heart rate of 112.  Labs showed a serum creatinine of 2.46 with a BUN of 57 bicarbonate of 25 potassium 3.7.  White count 17.1, hemoglobin 12.6, platelets 342  Venous blood gas pH is 7.48 PCO2 41 PO2 78.  BMP is pending.  Chest x-ray demonstrated cardiomegaly with CHF.  To the patient's acute CHF, A. fib, hypoxia, [facility] hospitalist contacted for admission.
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
## 2021-08-25 NOTE — Progress Notes (Signed)
[facility] for heparin Indication: atrial fibrillation  Allergies  Allergen Reactions   Oxycodone Hcl Other (See Comments)    Other reaction(s): "out of it" per pt   Ace Inhibitors Cough    Other reaction(s): cough   Lisinopril Cough   Oxycodone Other (See Comments)    "out of it"     Patient Measurements: Height: 5' 8.25" (173.4 cm) Weight: 102.3 kg (225 lb 9.6 oz) IBW/kg (Calculated) : 68.98 Heparin Dosing Weight: 90kg  Vital Signs: BP: 118/70 (01/24 2156) Pulse Rate: 92 (01/24 2156)  Labs: Recent Labs    08/24/21 2350 08/25/21 0004 08/25/21 0006 08/25/21 0205 08/25/21 0305 08/25/21 0905 08/25/21 1233 08/25/21 1501 08/25/21 2100  HGB 12.6* 12.6* 12.9*  --  11.5*  --   --   --   --   HCT 38.1* 37.0* 38.0*  --  34.6*  --   --   --   --   PLT 342  --   --   --  324  --   --   --   --   LABPROT  --   --   --   --   --  15.9*  --   --   --   INR  --   --   --   --   --  1.3*  --   --   --   HEPARINUNFRC  --   --   --   --   --  <0.10*  --   --  0.13*  CREATININE 2.46* 2.40*  --   --  2.43*  --   --   --   --   TROPONINIHS  --   --   --  328*  --   --  409* 408*  --      Estimated Creatinine Clearance: 28.7 mL/min (A) (by C-G formula based on SCr of 2.43 mg/dL (H)).   Medical History: Past Medical History:  Diagnosis Date   Arthritis    all joints   Chronic diastolic CHF (congestive heart failure) (HCC)    Chronic kidney disease (CKD), stage IV (severe) (HCC)    Chronic kidney disease, stage 3 (HCC)    Congestive heart failure with LV diastolic dysfunction, NYHA class 2 (HCC)    Family history of adverse reaction to anesthesia    father- change in personaility   GERD (gastroesophageal reflux disease)    Hypertension    Hypertension associated with diabetes ([place])    Hypertensive chronic kidney disease    Insulin dependent type 2 diabetes mellitus (HCC)    Neuropathy    OSA on CPAP    Sleep apnea    uses cpap, pt  does not know settings    Assessment: 81yo male admitted for acute CHF and new Afib w/ hypoxia to begin heparin.  Heparin level 0.13 and subtherapeutic on check. Hemoglobin and platelets are within normal limits. No signs of bleeding or IV site issues noted per RN.   Goal of Therapy:  Heparin level 0.3-0.7 units/ml Monitor platelets by anticoagulation protocol: Yes   Plan:  IV heparin 2700 unit bolus x1 Increase IV heparin gtt to 2000 units/h 6h heparin level check Daily heparin level, CBC Monitor for signs and symptoms of bleeding Follow-up plans for long-term oral anticoagulant  Thank you for involving pharmacy in this patient's care.  Elita Quick, PharmD PGY1 Ambulatory Care Pharmacy Resident 08/25/2021 10:17 PM  **Pharmacist phone directory can  be found on [email or web address] listed under [place]**

## 2021-08-25 NOTE — Progress Notes (Signed)
Echocardiogram 2D Echocardiogram has been performed.  Robert Dickerson 08/25/2021, 2:06 PM

## 2021-08-25 NOTE — Consult Note (Addendum)
[place] Nurse Consult Note: Patient receiving care in [facility] ED TRAAC-A Reason for Consult: RLE wounds Wound type: post debridement and application of [place] skin graft of the right leg completed by Dr. Sharol Given. Last seen in the office by Dr. Sharol Given on 08/13/21 and is to return to him in 4 weeks. At this point Dr. Sharol Given does not recommend any topical or dressing therapies, only the Vive Wear Compression Socks (wife states he has 2 pair and she will bring in a clean pair later today or tomorrow) (See photos in media tab taken yesterday and photos in Dr. Sharol Given note from 08/13/21) Patient has a small wound on the anterior side of the ankle that is somewhat painful for him. Placed a small foam dressing over this for cushion and protection.  Pressure Injury POA: NA Measurement: Medial RLE 9 cm x 8 cm. Lateral RLE 3 cm x 2 cm. Wound bed: Clean pink with no open wounds or drainage.  Dressing procedure/placement/frequency: Do not apply any dressing to the RLE skin graft sites. Patient given instructions not to put a dressing on these sites by Dr. Sharol Given, only his Vive Wear Compression Socks. Place a small foam dressing over the small irritation site in the anterior bend of the ankle on the RLE. Assess this site daily and change the foam dressing every 3 days.  Monitor the wound area(s) for worsening of condition such as: Signs/symptoms of infection, increase in size, development of or worsening of odor, development of pain, or increased pain at the affected locations.   Notify the medical team if any of these develop.  Thank you for the consult. [place] nurse will not follow at this time.   Please re-consult the [place] team if needed.  Cathlean Marseilles Tamala Julian, MSN, RN, [place], Lysle Pearl, [facility] Wound Treatment Associate Pager [phone]

## 2021-08-25 NOTE — ED Provider Notes (Signed)
[facility] EMERGENCY DEPARTMENT Provider Note   CSN: [id number] Arrival date & time: 08/24/21  2341     History  Chief Complaint  Patient presents with   Respiratory Distress    [name] is a 81 y.o. male.  HPI     81 year old male comes in with chief complaint of respiratory distress. He has history of CKD, CHF and diabetes.  No known history of lung disease or CAD.   Patient indicates that he has not been feeling well over the last few days.  He has been having weakness and some shortness of breath.  The shortness of breath has worsened over time.  This morning he started having chest tightness on the left side.  He also felt " Jell-O like" in his legs.  Tonight when he went to try and sleep, he started getting short of breath and got very scared.  Per EMS, when they arrived patient was tripoding and in hypoxic.  He remained hypoxic on nonrebreather, therefore they placed him on BiPAP.  EMS gave him nitroglycerin sublingually, chest tightness did improve with it.  Patient decays that he has a leg ulcer that is being managed by orthopedic surgery and had debridement surgery in December.  Pt has no hx of PE, DVT and denies any exogenous hormone (testosterone / estrogen) use, long distance travels or major surgery in the past 6 weeks, active cancer, recent immobilization.  Additionally, he denies any recent URI-like symptoms, fevers, chills. Finally, he indicates that when he saw his PCP recently they advised that he had abnormal EKG and needed to see cardiologist.  They also informed him that his kidney function was worse and asked him to discontinue his Lasix.Marland Kitchen   Home Medications Prior to Admission medications   Medication Sig Start Date End Date Taking? Authorizing Provider  ACCU-CHEK GUIDE test strip daily before breakfast. 12/25/20  Yes [provider]  Accu-Chek Softclix Lancets lancets daily before breakfast. 12/25/20  Yes [provider]  allopurinol (ZYLOPRIM) 300 MG tablet Take 300 mg by mouth at bedtime. 04/15/21  Yes [provider]  amLODipine (NORVASC) 10 MG tablet Take 10 mg by mouth daily. 04/15/21  Yes [provider]  blood glucose meter kit and supplies KIT Dispense based on patient and insurance preference. Use up to four times daily as directed. Patient taking differently: daily before breakfast. 12/25/20  Yes Kc, Ramesh, MD  blood glucose meter kit and supplies by Other route as directed. Check blood sugar every morning   Yes [provider]  clobetasol cream (TEMOVATE) 6.73 % Apply 1 application topically 2 (two) times daily. Patient taking differently: Apply 1 application topically 2 (two) times daily as needed (itching). 08/04/21  Yes Suzan Slick, NP  HYDROcodone-acetaminophen (NORCO/VICODIN) 5-325 MG tablet Take 1 tablet by mouth every 6 (six) hours as needed for moderate pain ((score 4 to 6)). 08/13/21  Yes Newt Minion, MD  hydrOXYzine (VISTARIL) 100 MG capsule Take 1 capsule (100 mg total) by mouth 3 (three) times daily as needed for itching. 08/13/21  Yes Newt Minion, MD  LANTUS SOLOSTAR 100 UNIT/ML Solostar Pen Inject 10 Units into the skin in the morning. 01/08/21  Yes [provider]  NON FORMULARY CPAP  at bedtime   Yes [provider]  predniSONE (DELTASONE) 5 MG tablet Take 5 mg by mouth daily with breakfast. 04/01/19  Yes [provider]  pregabalin (LYRICA) 75 MG capsule Take 75 mg by mouth  2 (two) times daily. 08/19/20  Yes [provider]  ciprofloxacin (CIPRO) 500 MG tablet Take 1 tablet (500 mg total) by mouth 2 (two) times daily. Patient not taking: Reported on 08/25/2021 07/06/21   Newt Minion, MD  furosemide (LASIX) 80 MG tablet Take 160 mg by mouth daily. Patient not taking: Reported on 08/25/2021 04/15/21   [provider]  metolazone (ZAROXOLYN) 5 MG tablet Take 5 mg by mouth in the morning. Patient not  taking: Reported on 08/25/2021 05/07/21   [provider]  potassium chloride SA (KLOR-CON) 20 MEQ tablet Take 20 mEq by mouth daily. Patient not taking: Reported on 08/25/2021 04/15/21   [provider]      Allergies    Oxycodone hcl, Ace inhibitors, Lisinopril, and Oxycodone    Review of Systems   Review of Systems  Physical Exam Updated Vital Signs BP 138/71    Pulse (!) 149    Temp 98.2 F (36.8 C) (Oral)    Resp (!) 24    Ht 5' 8.25" (1.734 m)    Wt 102.3 kg    SpO2 93%    BMI 34.05 kg/m  Physical Exam Vitals and nursing note reviewed.  Constitutional:      General: He is in acute distress.     Appearance: He is well-developed. He is diaphoretic.  HENT:     Head: Atraumatic.  Cardiovascular:     Rate and Rhythm: Tachycardia present.  Pulmonary:     Breath sounds: Rales present. No wheezing.  Musculoskeletal:     Cervical back: Neck supple.     Right lower leg: No edema.     Left lower leg: No edema.  Skin:    General: Skin is warm.  Neurological:     Mental Status: He is alert and oriented to person, place, and time.    ED Results / Procedures / Treatments   Labs (all labs ordered are listed, but only abnormal results are displayed) Labs Reviewed  BASIC METABOLIC PANEL - Abnormal; Notable for the following components:      Result Value   Sodium 131 (*)    Chloride 94 (*)    Glucose, Bld 228 (*)    BUN 57 (*)    Creatinine, Ser 2.46 (*)    Calcium 8.3 (*)    GFR, Estimated 26 (*)    All other components within normal limits  CBC WITH DIFFERENTIAL/PLATELET - Abnormal; Notable for the following components:   WBC 17.1 (*)    Hemoglobin 12.6 (*)    HCT 38.1 (*)    RDW 15.9 (*)    Neutro Abs 14.5 (*)    Monocytes Absolute 1.4 (*)    Abs Immature Granulocytes 0.24 (*)    All other components within normal limits  BRAIN NATRIURETIC PEPTIDE - Abnormal; Notable for the following components:   B Natriuretic Peptide 378.5 (*)    All other  components within normal limits  I-STAT CHEM 8, ED - Abnormal; Notable for the following components:   Sodium 132 (*)    Chloride 95 (*)    BUN 55 (*)    Creatinine, Ser 2.40 (*)    Glucose, Bld 227 (*)    Calcium, Ion 1.03 (*)    Hemoglobin 12.6 (*)    HCT 37.0 (*)    All other components within normal limits  I-STAT VENOUS BLOOD GAS, ED - Abnormal; Notable for the following components:   pH, Ven 7.488 (*)    pCO2,  Ven 41.1 (*)    pO2, Ven 78.0 (*)    Bicarbonate 31.2 (*)    Acid-Base Excess 7.0 (*)    Sodium 132 (*)    Calcium, Ion 1.02 (*)    HCT 38.0 (*)    Hemoglobin 12.9 (*)    All other components within normal limits  RESP PANEL BY RT-PCR (FLU A&B, COVID) ARPGX2  PROTIME-INR  MAGNESIUM  HEPARIN LEVEL (UNFRACTIONATED)  TSH  MAGNESIUM  COMPREHENSIVE METABOLIC PANEL  CBC WITH DIFFERENTIAL/PLATELET  HEMOGLOBIN A1C  HEMOGLOBIN A1C  TROPONIN I (HIGH SENSITIVITY)  TROPONIN I (HIGH SENSITIVITY)    EKG EKG Interpretation  Date/Time:  Monday August 24 2021 23:46:33 EST Ventricular Rate:  112 PR Interval:  209 QRS Duration: 76 QT Interval:  346 QTC Calculation: 451 R Axis:   36 Text Interpretation: Atrial fibrillation Paired ventricular premature complexes Borderline prolonged PR interval Nonspecific T abnormalities, lateral leads afib is new Confirmed by Varney Biles ([id number]) on 08/25/2021 12:24:51 AM  Radiology DG Chest Port 1 View  Result Date: 08/25/2021 CLINICAL DATA:  Difficulty breathing EXAM: PORTABLE CHEST 1 VIEW COMPARISON:  05/19/2021 FINDINGS: Cardiomegaly with vascular congestion. Interstitial prominence and perihilar opacities compatible with edema/CHF. No visible effusions. No acute bony abnormality. IMPRESSION: Mild CHF. Electronically Signed   By: Rolm Baptise M.D.   On: 08/25/2021 00:02    Procedures .Critical Care Performed by: Varney Biles, MD Authorized by: Varney Biles, MD   Critical care provider statement:    Critical care time  (minutes):  48   Critical care was time spent personally by me on the following activities:  Development of treatment plan with patient or surrogate, discussions with consultants, evaluation of patient's response to treatment, examination of patient, ordering and review of laboratory studies, ordering and review of radiographic studies, ordering and performing treatments and interventions, pulse oximetry, re-evaluation of patient's condition and review of old charts    Medications Ordered in ED Medications  heparin ADULT infusion 100 units/mL (25000 units/254m) (1,500 Units/hr Intravenous New Bag/Given 08/25/21 0201)  predniSONE (DELTASONE) tablet 5 mg (has no administration in time range)  insulin glargine (LANTUS) Solostar Pen 10 Units (has no administration in time range)  pregabalin (LYRICA) capsule 75 mg (has no administration in time range)  acetaminophen (TYLENOL) tablet 650 mg (has no administration in time range)    Or  acetaminophen (TYLENOL) suppository 650 mg (has no administration in time range)  ondansetron (ZOFRAN) tablet 4 mg (has no administration in time range)    Or  ondansetron (ZOFRAN) injection 4 mg (has no administration in time range)  albuterol (PROVENTIL) (2.5 MG/3ML) 0.083% nebulizer solution 2.5 mg (has no administration in time range)  insulin aspart (novoLOG) injection 0-15 Units (has no administration in time range)  insulin aspart (novoLOG) injection 0-5 Units (has no administration in time range)  metoprolol tartrate (LOPRESSOR) injection 2.5 mg (has no administration in time range)  furosemide (LASIX) injection 60 mg (60 mg Intravenous Given 08/25/21 0044)  metoprolol tartrate (LOPRESSOR) injection 2.5 mg (2.5 mg Intravenous Given 08/25/21 0209)  heparin bolus via infusion 3,000 Units (3,000 Units Intravenous Bolus from Bag 08/25/21 0201)    ED Course/ Medical Decision Making/ A&P Clinical Course as of 08/25/21 0300  Tue Aug 25, 2021  0300 Tinges to be  tachypneic, but no respiratory distress and no hypoxia.  Medicine has admitted the patient. [AN]    Clinical Course User Index [AN] NVarney Biles MD  Medical Decision Making Amount and/or Complexity of Data Reviewed Labs: ordered. Radiology: ordered.  Risk Prescription drug management. Decision regarding hospitalization.   This patient presents to the ED with chief complaint(s) of chest pain and shortness of breath with pertinent past medical history of CAD, CKD, CHF and most likely new onset A. fib which further complicates the presenting complaint. The complaint involves an extensive differential diagnosis and treatment options and also carries with it a high risk of complications and morbidity.    The differential diagnosis includes flash pulmonary edema, CHF exacerbation, valvular disorder, ACS, PE, pneumonia, pleural effusion, COVID-19.  The initial plan is to keep the patient on BiPAP, get basic labs and reassess.  My suspicion right now is that patient is having acute CHF exacerbation.  However, the chest tightness and improvement of it with nitroglycerin does get [place] concern for posterior MI that led to CHF.  EKG however is not showing any ischemic findings which is reassuring.  Patient clearly is in A. fib during my assessment with irregularly irregular rhythm, I suspect that he is having decompensated CHF possibly because of his A. Fib.  Additional Test Considered: Consider D-dimer and PE work-up, however, no signs of DVT and despite the outpatient debridement surgery, patient indicates that he has been ambulatory.  At this time, it appears as the shortness of breath is because of CHF/A. Fib and possibly ACS and not PE.   Additional history obtained: Additional history obtained from EMS  Records reviewed previous admission documents  Reassessment and review: Lab Tests: BNP slightly elevated.  Creatinine appears to be at baseline.  CBC reveals no  significant anemia and blood gas does not reveal respiratory acidosis. White count is elevated, but the leukocytosis is likely reactive in nature.  Imaging Studies ordered: I independently visualized and interpreted the following imaging X-ray of the chest   which showed interstitial edema and concerns for CHF.  No evidence of pneumothorax or large pleural effusion. I agree with the radiologist interpretation  Critical Interventions:  Start patient on heparin for A. fib. - Discontinued BiPAP, and titrated down to O2 per nasal cannula  Cardiac Monitoring: The patient was maintained on a cardiac monitor.  I personally viewed and interpreted the cardiac monitor which showed an underlying rhythm of:  Atrial Fibrillation  Medicines ordered and prescription drug management: I ordered the following medications IV Lasix and IV heparin for diuresing purposes and to reduce risk for thromboembolic events given his A. Fib.   Reevaluation of the patient after these medicines showed that the patient    improved  Consultations Obtained: I requested consultation with the admitting physician   , and discussed  findings as well as pertinent plan -they agree with the plan to admit.  Complexity of problems addressed: Patients presentation is most consistent with  acute presentation with potential threat to life or bodily function and severe exacerbation of chronic illness During patient's assessment  Disposition: Admission to the hospital for further diagnostic work-up and optimal management.   Final Clinical Impression(s) / ED Diagnoses Final diagnoses:  Paroxysmal atrial fibrillation ([place])  Acute congestive heart failure, unspecified heart failure type [facility])    Rx / DC Orders ED Discharge Orders     None         Varney Biles, MD 08/25/21 0300

## 2021-08-25 NOTE — Assessment & Plan Note (Signed)
Check A1c. Add SSI. Continue lantus.
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
## 2021-08-25 NOTE — Assessment & Plan Note (Signed)
On chronic predisone 5 mg daily

## 2021-08-25 NOTE — Assessment & Plan Note (Signed)
Admit to progressive bed. IV lasix 40 mg q12h. Monitor Scr. Check echo. Cardiology consult for afib/chf. May need to consider TEE/DCCV.

## 2021-08-26 DIAGNOSIS — N184 Chronic kidney disease, stage 4 (severe): Secondary | ICD-10-CM | POA: Diagnosis not present

## 2021-08-26 DIAGNOSIS — N179 Acute kidney failure, unspecified: Secondary | ICD-10-CM | POA: Diagnosis not present

## 2021-08-26 DIAGNOSIS — J9601 Acute respiratory failure with hypoxia: Secondary | ICD-10-CM | POA: Diagnosis not present

## 2021-08-26 DIAGNOSIS — I5033 Acute on chronic diastolic (congestive) heart failure: Secondary | ICD-10-CM | POA: Diagnosis not present

## 2021-08-26 LAB — URINALYSIS, ROUTINE W REFLEX MICROSCOPIC
Bilirubin Urine: NEGATIVE
Glucose, UA: NEGATIVE mg/dL
Hgb urine dipstick: NEGATIVE
Ketones, ur: NEGATIVE mg/dL
Leukocytes,Ua: NEGATIVE
Nitrite: NEGATIVE
Protein, ur: NEGATIVE mg/dL
Specific Gravity, Urine: 1.013 (ref 1.005–1.030)
pH: 5 (ref 5.0–8.0)

## 2021-08-26 LAB — BASIC METABOLIC PANEL
Anion gap: 10 (ref 5–15)
BUN: 63 mg/dL — ABNORMAL HIGH (ref 8–23)
CO2: 26 mmol/L (ref 22–32)
Calcium: 8.2 mg/dL — ABNORMAL LOW (ref 8.9–10.3)
Chloride: 96 mmol/L — ABNORMAL LOW (ref 98–111)
Creatinine, Ser: 2.76 mg/dL — ABNORMAL HIGH (ref 0.61–1.24)
GFR, Estimated: 23 mL/min — ABNORMAL LOW (ref >60)
Glucose, Bld: 156 mg/dL — ABNORMAL HIGH (ref 70–99)
Potassium: 3.6 mmol/L (ref 3.5–5.1)
Sodium: 132 mmol/L — ABNORMAL LOW (ref 135–145)

## 2021-08-26 LAB — CBC
HCT: 36.2 % — ABNORMAL LOW (ref 39.0–52.0)
Hemoglobin: 11.7 g/dL — ABNORMAL LOW (ref 13.0–17.0)
MCH: 28.4 pg (ref 26.0–34.0)
MCHC: 32.3 g/dL (ref 30.0–36.0)
MCV: 87.9 fL (ref 80.0–100.0)
Platelets: 300 10*3/uL (ref 150–400)
RBC: 4.12 MIL/uL — ABNORMAL LOW (ref 4.22–5.81)
RDW: 16.1 % — ABNORMAL HIGH (ref 11.5–15.5)
WBC: 14.4 10*3/uL — ABNORMAL HIGH (ref 4.0–10.5)
nRBC: 0 % (ref 0.0–0.2)

## 2021-08-26 LAB — GLUCOSE, CAPILLARY
Glucose-Capillary: 112 mg/dL — ABNORMAL HIGH (ref 70–99)
Glucose-Capillary: 134 mg/dL — ABNORMAL HIGH (ref 70–99)

## 2021-08-26 LAB — CBG MONITORING, ED
Glucose-Capillary: 149 mg/dL — ABNORMAL HIGH (ref 70–99)
Glucose-Capillary: 196 mg/dL — ABNORMAL HIGH (ref 70–99)

## 2021-08-26 LAB — LIPID PANEL
Cholesterol: 149 mg/dL (ref 0–200)
HDL: 64 mg/dL (ref >40)
LDL Cholesterol: 70 mg/dL (ref 0–99)
Total CHOL/HDL Ratio: 2.3 RATIO
Triglycerides: 76 mg/dL (ref <150)
VLDL: 15 mg/dL (ref 0–40)

## 2021-08-26 LAB — BRAIN NATRIURETIC PEPTIDE: B Natriuretic Peptide: 577.1 pg/mL — ABNORMAL HIGH (ref 0.0–100.0)

## 2021-08-26 LAB — HEPARIN LEVEL (UNFRACTIONATED)
Heparin Unfractionated: 0.46 IU/mL (ref 0.30–0.70)
Heparin Unfractionated: 0.38 IU/mL (ref 0.30–0.70)

## 2021-08-26 MED ORDER — HYDRALAZINE HCL 10 MG PO TABS
10.0000 mg | ORAL_TABLET | Freq: Three times a day (TID) | ORAL | Status: DC
  Administered 2021-08-26 – 2021-08-27 (×3): 10 mg via ORAL
  Filled 2021-08-26 (×5): qty 1

## 2021-08-26 MED ORDER — FUROSEMIDE 10 MG/ML IJ SOLN
80.0000 mg | Freq: Four times a day (QID) | INTRAMUSCULAR | Status: DC
  Administered 2021-08-26 – 2021-08-28 (×8): 80 mg via INTRAVENOUS
  Filled 2021-08-26 (×8): qty 8

## 2021-08-26 MED ORDER — ISOSORBIDE DINITRATE 10 MG PO TABS
10.0000 mg | ORAL_TABLET | Freq: Three times a day (TID) | ORAL | Status: DC
  Administered 2021-08-26 – 2021-08-27 (×4): 10 mg via ORAL
  Filled 2021-08-26 (×6): qty 1

## 2021-08-26 NOTE — Progress Notes (Signed)
Progress Note  Patient Name: Robert Dickerson Date of Encounter: 08/26/2021  [facility] Cardiologist: Sanda Klein, MD   Subjective   Breathing better but still has dyspnea at rest. "Filled the jug 4 times ", urine output is not accurately collected.  No weights available.  Inpatient Medications    Scheduled Meds:  allopurinol  150 mg Oral QHS   furosemide  80 mg Intravenous BID   insulin aspart  0-15 Units Subcutaneous TID WC   insulin aspart  0-5 Units Subcutaneous QHS   insulin glargine-yfgn  10 Units Subcutaneous Daily   metoprolol tartrate  25 mg Oral BID   predniSONE  5 mg Oral Q breakfast   pregabalin  75 mg Oral BID   Continuous Infusions:  heparin 2,000 Units/hr (08/25/21 2220)   PRN Meds: acetaminophen **OR** acetaminophen, albuterol, hydrOXYzine, ondansetron **OR** ondansetron (ZOFRAN) IV, oxyCODONE   Vital Signs    Vitals:   08/26/21 0300 08/26/21 0400 08/26/21 0600 08/26/21 0630  BP: 124/72  130/73 126/68  Pulse: 81 84 70 80  Resp: 19 (!) 23 18 18   Temp:      TempSrc:      SpO2: 93% 94% 98% 98%  Weight:      Height:        Intake/Output Summary (Last 24 hours) at 08/26/2021 0808 Last data filed at 08/25/2021 1443 Gross per 24 hour  Intake 216.61 ml  Output 500 ml  Net -283.39 ml   Last 3 Weights 08/25/2021 07/01/2021 06/22/2021  Weight (lbs) 225 lb 9.6 oz 220 lb 218 lb  Weight (kg) 102.331 kg 99.791 kg 98.884 kg      Telemetry    Sinus rhythm with very frequent PACs, no true atrial fibrillation- Personally Reviewed  ECG    No new trace- Personally Reviewed  Physical Exam  Oxygen by facemask.  Obese.  Does not appear uncomfortable. GEN: No acute distress.   Neck: No JVD Cardiac: RRR went ectopy, no murmurs, rubs, or gallops.  Respiratory: Clear to auscultation bilaterally. GI: Soft, nontender, non-distended  MS: No edema; No deformity.  Some seropurulent drainage from superficial skin wound right shin area Neuro:  Nonfocal   Psych: Normal affect   Labs    High Sensitivity Troponin:   Recent Labs  Lab 08/25/21 0205 08/25/21 1233 08/25/21 1501  TROPONINIHS 328* 409* 408*     Chemistry Recent Labs  Lab 08/24/21 2350 08/25/21 0004 08/25/21 0006 08/25/21 0305 08/26/21 0615  NA 131* 132* 132* 134* 132*  K 3.7 3.7 3.7 3.8 3.6  CL 94* 95*  --  97* 96*  CO2 25  --   --  27 26  GLUCOSE 228* 227*  --  213* 156*  BUN 57* 55*  --  54* 63*  CREATININE 2.46* 2.40*  --  2.43* 2.76*  CALCIUM 8.3*  --   --  7.8* 8.2*  MG  --   --   --  2.2  --   PROT  --   --   --  5.6*  --   ALBUMIN  --   --   --  2.5*  --   AST  --   --   --  21  --   ALT  --   --   --  16  --   ALKPHOS  --   --   --  112  --   BILITOT  --   --   --  1.4*  --  GFRNONAA 26*  --   --  26* 23*  ANIONGAP 12  --   --  10 10    Lipids No results for input(s): CHOL, TRIG, HDL, LABVLDL, LDLCALC, CHOLHDL in the last 168 hours.  Hematology Recent Labs  Lab 08/24/21 2350 08/25/21 0004 08/25/21 0006 08/25/21 0305 08/26/21 0615  WBC 17.1*  --   --  16.6* 14.4*  RBC 4.42  --   --  4.03* 4.12*  HGB 12.6*   < > 12.9* 11.5* 11.7*  HCT 38.1*   < > 38.0* 34.6* 36.2*  MCV 86.2  --   --  85.9 87.9  MCH 28.5  --   --  28.5 28.4  MCHC 33.1  --   --  33.2 32.3  RDW 15.9*  --   --  16.0* 16.1*  PLT 342  --   --  324 300   < > = values in this interval not displayed.   Thyroid  Recent Labs  Lab 08/25/21 0905  TSH 4.104    BNP Recent Labs  Lab 08/24/21 2350  BNP 378.5*    DDimer No results for input(s): DDIMER in the last 168 hours.   Radiology    DG Chest Port 1 View  Result Date: 08/25/2021 CLINICAL DATA:  Difficulty breathing EXAM: PORTABLE CHEST 1 VIEW COMPARISON:  05/19/2021 FINDINGS: Cardiomegaly with vascular congestion. Interstitial prominence and perihilar opacities compatible with edema/CHF. No visible effusions. No acute bony abnormality. IMPRESSION: Mild CHF. Electronically Signed   By: Rolm Baptise M.D.   On: 08/25/2021  00:02   ECHOCARDIOGRAM COMPLETE  Result Date: 08/25/2021    ECHOCARDIOGRAM REPORT   Patient Name:   Robert Dickerson Date of Exam: 08/25/2021 Medical Rec #:  [id number]     Height:       68.3 in Accession #:    [id number]    Weight:       225.6 lb Date of Birth:  05-20-1941    BSA:          2.157 m Patient Age:    81 years      BP:           110/64 mmHg Patient Gender: M             HR:           81 bpm. Exam Location:  Inpatient Procedure: 2D Echo Indications:    Atrial fibrillation  History:        Patient has prior history of Echocardiogram examinations, most                 recent 09/04/2013. Risk Factors:Hypertension and Diabetes.  Sonographer:    Jefferey Pica Referring Phys: [place]  1. Left ventricular ejection fraction, by estimation, is 30 to 35%. The left ventricle has moderately decreased function. The left ventricle demonstrates global hypokinesis. The left ventricular internal cavity size was mildly dilated. There is mild concentric left ventricular hypertrophy. Left ventricular diastolic parameters are indeterminate.  2. Right ventricular systolic function is normal. The right ventricular size is normal.  3. The mitral valve is grossly normal. No evidence of mitral valve regurgitation.  4. The aortic valve was not well visualized. Aortic valve regurgitation is not visualized. No aortic stenosis is present.  5. The inferior vena cava is dilated in size with <50% respiratory variability, suggesting right atrial pressure of 15 mmHg. FINDINGS  Left Ventricle: Left ventricular ejection fraction, by estimation, is 30 to 35%.  The left ventricle has moderately decreased function. The left ventricle demonstrates global hypokinesis. The left ventricular internal cavity size was mildly dilated. There is mild concentric left ventricular hypertrophy. Left ventricular diastolic parameters are indeterminate. Right Ventricle: The right ventricular size is normal. No increase in right ventricular  wall thickness. Right ventricular systolic function is normal. Left Atrium: Left atrial size was normal in size. Right Atrium: Right atrial size was normal in size. Pericardium: Trivial pericardial effusion is present. Mitral Valve: The mitral valve is grossly normal. No evidence of mitral valve regurgitation. Tricuspid Valve: The tricuspid valve is normal in structure. Tricuspid valve regurgitation is not demonstrated. No evidence of tricuspid stenosis. Aortic Valve: The aortic valve was not well visualized. Aortic valve regurgitation is not visualized. No aortic stenosis is present. Aortic valve peak gradient measures 3.2 mmHg. Pulmonic Valve: The pulmonic valve was normal in structure. Pulmonic valve regurgitation is not visualized. No evidence of pulmonic stenosis. Aorta: The aortic root and ascending aorta are structurally normal, with no evidence of dilitation. Venous: The inferior vena cava is dilated in size with less than 50% respiratory variability, suggesting right atrial pressure of 15 mmHg. IAS/Shunts: No atrial level shunt detected by color flow Doppler.  LEFT VENTRICLE PLAX 2D LVIDd:         6.00 cm   Diastology LVIDs:         5.10 cm   LV e' lateral:   5.25 cm/s LV PW:         1.10 cm   LV E/e' lateral: 21.9 LV IVS:        1.10 cm LVOT diam:     2.10 cm LV SV:         56 LV SV Index:   26 LVOT Area:     3.46 cm  RIGHT VENTRICLE          IVC RV Basal diam:  3.40 cm  IVC diam: 2.40 cm TAPSE (M-mode): 1.8 cm LEFT ATRIUM             Index        RIGHT ATRIUM           Index LA diam:        4.00 cm 1.85 cm/m   RA Area:     13.90 cm LA Vol (A2C):   80.7 ml 37.41 ml/m  RA Volume:   36.00 ml  16.69 ml/m LA Vol (A4C):   61.3 ml 28.41 ml/m LA Biplane Vol: 70.5 ml 32.68 ml/m  AORTIC VALVE                 PULMONIC VALVE AV Area (Vmax): 3.31 cm     PV Vmax:       0.57 m/s AV Vmax:        88.80 cm/s   PV Peak grad:  1.3 mmHg AV Peak Grad:   3.2 mmHg LVOT Vmax:      84.90 cm/s LVOT Vmean:     45.900 cm/s  LVOT VTI:       0.163 m  AORTA Ao Root diam: 3.70 cm Ao Asc diam:  3.30 cm MITRAL VALVE MV Area (PHT): 3.79 cm     SHUNTS MV Decel Time: 200 msec     Systemic VTI:  0.16 m MV E velocity: 115.00 cm/s  Systemic Diam: 2.10 cm MV A velocity: 107.00 cm/s MV E/A ratio:  1.07 Rudean Haskell MD Electronically signed by Rudean Haskell MD Signature Date/Time: 08/25/2021/3:00:07 PM  Final     Cardiac Studies   ECHO 08/25/2021 1. Left ventricular ejection fraction, by estimation, is 30 to 35%. The left ventricle has moderately decreased function. The left ventricle demonstrates global hypokinesis. The left ventricular internal cavity size was mildly dilated. There is mild concentric left ventricular hypertrophy. Left ventricular diastolic parameters are indeterminate.   2. Right ventricular systolic function is normal. The right ventricular size is normal.   3. The mitral valve is grossly normal. No evidence of mitral valve regurgitation.   4. The aortic valve was not well visualized. Aortic valve regurgitation is not visualized. No aortic stenosis is present.   5. The inferior vena cava is dilated in size with <50% respiratory variability, suggesting right atrial pressure of 15 mmHg.  Patient Profile     81 y.o. male with previous HFpEF (EF 50-55%), CKD 4, DM2 on insulin, HTN, OSA presents with acute pulmonary edema and new reduction in LVEF (30-35%, global pattern) and suspicion for paroxysmal atrial fibrillation (probably artifactual due to frequent PACs and poor quality tracing)  Assessment & Plan    CHF: diuretics were stopped due to worsening renal function before he presented w CHF, but there is also interval dilation of the LV and a global reduction in LVEF. Could well have multivessel CAD. Still has evidence of hypervolemia.  BP a little higher, will try to add low dose hydralazine/nitrates. NSTEMI: did have angina on presentation, resolved after diuresis; tiny increase in troponin in  keeping with demand injury. Suspect that he may have multivessel CAD, but doubt that he had a true atherothrombotic acute coronary syndrome. Discussed invasive evaluation w cardiac cath, but he is at high risk for nephrotoxicity. He appears quite positive that he does not want to ever go on hemodialysis. Will try to manage medically. On beta blocker and ASA. AKI on CKD4: expected mild increase in creatinine w diuresis.  Neither his fluid input nor outputs are correctly recorded.  He seems to be having reasonably good diuresis and will continue diuretics today.  Recommend consulting Nephrology (he sees Dr. Joelyn Oms as an outpt). Frequent PACs: do not see evidence of true atrial fibrillation. No indication for long term anticoagulation at this time.     For questions or updates, please contact [place] Please consult [email or web address] for contact info under        Signed, Sanda Klein, MD  08/26/2021, 8:08 AM

## 2021-08-26 NOTE — ED Notes (Signed)
Pt unable to tolerate Bipap. NRB replaced. 02 94%.

## 2021-08-26 NOTE — Progress Notes (Signed)
ANTICOAGULATION CONSULT NOTE - Follow Up Consult  Pharmacy Consult for heparin Indication: atrial fibrillation  Labs: Recent Labs    08/24/21 2350 08/25/21 0004 08/25/21 0006 08/25/21 0205 08/25/21 0305 08/25/21 0905 08/25/21 1233 08/25/21 1501 08/25/21 2100 08/26/21 0615  HGB 12.6* 12.6* 12.9*  --  11.5*  --   --   --   --  11.7*  HCT 38.1* 37.0* 38.0*  --  34.6*  --   --   --   --  36.2*  PLT 342  --   --   --  324  --   --   --   --  300  LABPROT  --   --   --   --   --  15.9*  --   --   --   --   INR  --   --   --   --   --  1.3*  --   --   --   --   HEPARINUNFRC  --   --   --   --   --  <0.10*  --   --  0.13* 0.46  CREATININE 2.46* 2.40*  --   --  2.43*  --   --   --   --  2.76*  TROPONINIHS  --   --   --  328*  --   --  409* 408*  --   --     Assessment/Plan:  81yo male therapeutic on heparin after rate change. Will continue infusion at current rate of 2000 units/hr and confirm stable with additional level.   Wynona Neat, PharmD, BCPS  08/26/2021,7:06 AM
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
## 2021-08-26 NOTE — Progress Notes (Signed)
Heart Failure Nurse Navigator Progress Note  Following this hospitalization to assess for HV TOC readiness. Pt resting in bed on oxygen, HOB 30. Spouse at bedside. Pt speaking in broken sentences, visible dyspnea at rest. Pt arrived to floor, has standing scale weight and 154mL UOP.   Will plan to interview tomorrow, hopeful for improvement and increased UOP overnight.   Pricilla Holm, MSN, RN Heart Failure Nurse Navigator [phone]

## 2021-08-26 NOTE — Consult Note (Signed)
Robert Dickerson Renal Consultation Note  Requesting MD: Croitoru Indication for Consultation: A on CRF-  volume overload   HPI:  Robert Dickerson is a 81 y.o. male with past medical history significant for DM, HTN, OSA, diastolic heart failure as well as longstanding CKD- at least since 2008 followed by Dr. Joelyn Oms at [facility]-  last seen in November-  crt pretty stable at 2.2-   range of baseline appears to be high 1's to mid 2's- late stage 3- early stage 4.  According to the chart-  pt had seen his PCP last week-  was in Afib-  his kidney function was noted to be worse ?  But I dont have the value so pt was told to stop his lasix  ( was on 160 daily ) - recheck on Friday was "better".  He presented to the ER last night with SOB, PND-  found to be in rapid Afib-  crt 2.46 and CXR showing cardiomegaly with CHF--  was given IV lasix -  is still sob-  cannot speak in full sentences.  Cardiology has seen-  is irreg but not in Afib-  echo has shown worsening EF-  poor candidate for invasive eval so trying to treat medically   Creatinine, Ser  Date/Time Value Ref Range Status  08/26/2021 06:15 AM 2.76 (H) 0.61 - 1.24 mg/dL Final  08/25/2021 03:05 AM 2.43 (H) 0.61 - 1.24 mg/dL Final  08/25/2021 12:04 AM 2.40 (H) 0.61 - 1.24 mg/dL Final  08/24/2021 11:50 PM 2.46 (H) 0.61 - 1.24 mg/dL Final  07/01/2021 07:08 AM 2.20 (H) 0.61 - 1.24 mg/dL Final  06/05/2021 11:56 AM 2.91 (H) 0.61 - 1.24 mg/dL Final  05/21/2021 04:03 AM 2.18 (H) 0.61 - 1.24 mg/dL Final  05/20/2021 02:49 AM 2.38 (H) 0.61 - 1.24 mg/dL Final  05/19/2021 01:26 AM 2.42 (H) 0.61 - 1.24 mg/dL Final  05/18/2021 04:44 PM 2.70 (H) 0.61 - 1.24 mg/dL Final  05/18/2021 04:31 PM 2.71 (H) 0.61 - 1.24 mg/dL Final  12/25/2020 02:53 AM 1.67 (H) 0.61 - 1.24 mg/dL Final  12/24/2020 01:22 AM 2.03 (H) 0.61 - 1.24 mg/dL Final  12/23/2020 02:56 PM 2.20 (H) 0.61 - 1.24 mg/dL Final  12/23/2020 03:15 AM 2.41 (H) 0.61 - 1.24 mg/dL Final  12/22/2020 07:57 PM  2.46 (H) 0.61 - 1.24 mg/dL Final  11/23/2015 05:50 PM 2.08 (H) 0.61 - 1.24 mg/dL Final  03/04/2011 02:14 PM 1.46 (H) 0.50 - 1.35 mg/dL Final  03/15/2009 10:00 AM 1.63 (H) 0.4 - 1.5 mg/dL Final  12/25/2007 02:01 PM 1.7 (H)  Final  12/24/2007 12:09 PM 1.64 (H)  Final  07/10/2007 04:50 AM 2.75 (H)  Final  07/09/2007 05:00 AM 2.93 (H)  Final  07/08/2007 02:05 PM 3.10 (H)  Final  02/27/2007 08:55 AM 1.32  Final     PMHx:   Past Medical History:  Diagnosis Date   Arthritis    all joints   Chronic diastolic CHF (congestive heart failure) (HCC)    Chronic kidney disease (CKD), stage IV (severe) (HCC)    Chronic kidney disease, stage 3 (HCC)    Congestive heart failure with LV diastolic dysfunction, NYHA class 2 ([place])    Family history of adverse reaction to anesthesia    father- change in personaility   GERD (gastroesophageal reflux disease)    Hypertension    Hypertension associated with diabetes ([place])    Hypertensive chronic kidney disease    Insulin dependent type 2 diabetes mellitus ([place])  Neuropathy    OSA on CPAP    Sleep apnea    uses cpap, pt does not know settings    Past Surgical History:  Procedure Laterality Date   ANTERIOR LAT LUMBAR FUSION Left 01/22/2021   Procedure: Lumbar one-Lumbar two Lateral Lumbar Interbody Fusion;  Surgeon: Vallarie Mare, MD;  Location: [facility];  Service: Neurosurgery;  Laterality: Left;   ANTERIOR LAT LUMBAR FUSION  01/22/2021   APPENDECTOMY     BACK SURGERY     4 surg, lower   CHOLECYSTECTOMY     COLONOSCOPY WITH PROPOFOL N/A 06/11/2014   Procedure: COLONOSCOPY WITH PROPOFOL;  Surgeon: Garlan Fair, MD;  [facility];  Service: Endoscopy;  Laterality: N/A;   ESOPHAGOGASTRODUODENOSCOPY  08/26/2011   Procedure: ESOPHAGOGASTRODUODENOSCOPY (EGD);  Surgeon: Garlan Fair, MD;  [facility];  Service: Endoscopy;  Laterality: N/A;   ESOPHAGOGASTRODUODENOSCOPY (EGD) WITH PROPOFOL N/A 06/11/2014   Procedure:  ESOPHAGOGASTRODUODENOSCOPY (EGD) WITH PROPOFOL;  Surgeon: Garlan Fair, MD;  [facility];  Service: Endoscopy;  Laterality: N/A;   EUS  09/15/2011   Procedure: UPPER ENDOSCOPIC ULTRASOUND (EUS) LINEAR;  Surgeon: Landry Dyke, MD;  [facility];  Service: Endoscopy;  Laterality: N/A;  MAC   I & D EXTREMITY Right 05/20/2021   Procedure: IRRIGATION AND DEBRIDEMENT OF LEG AND APPLICATION OF SKIN GRAFT;  Surgeon: Newt Minion, MD;  Location: [facility];  Service: Orthopedics;  Laterality: Right;   I & D EXTREMITY Right 07/03/2021   Procedure: REPEAT RIGHT LEG DEBRIDEMENT;  Surgeon: Newt Minion, MD;  Location: [facility];  Service: Orthopedics;  Laterality: Right;   I & D EXTREMITY Right 07/01/2021   Procedure: RIGHT LEG DEBRIDEMENT;  Surgeon: Newt Minion, MD;  Location: [facility];  Service: Orthopedics;  Laterality: Right;   KNEE ARTHROSCOPY Right YEARS AGO    Family Hx:  Family History  Problem Relation Age of Onset   Hypertension Mother    Anesthesia problems Neg Hx    Hypotension Neg Hx    Malignant hyperthermia Neg Hx    Pseudochol deficiency Neg Hx     Social History:  reports that he quit smoking about 39 years ago. His smoking use included cigarettes. He has a 40.00 pack-year smoking history. He has never used smokeless tobacco. He reports that he does not currently use alcohol. He reports that he does not use drugs.  Allergies:  Allergies  Allergen Reactions   Oxycodone Hcl Other (See Comments)    Other reaction(s): "out of it" per pt   Ace Inhibitors Cough    Other reaction(s): cough   Lisinopril Cough   Oxycodone Other (See Comments)    "out of it"     Medications: Prior to Admission medications   Medication Sig Start Date End Date Taking? Authorizing Provider  ACCU-CHEK GUIDE test strip daily before breakfast. 12/25/20  Yes [provider]  Accu-Chek Softclix Lancets lancets daily before breakfast. 12/25/20  Yes [provider]   allopurinol (ZYLOPRIM) 300 MG tablet Take 300 mg by mouth at bedtime. 04/15/21  Yes [provider]  amLODipine (NORVASC) 10 MG tablet Take 10 mg by mouth daily. 04/15/21  Yes [provider]  blood glucose meter kit and supplies KIT Dispense based on patient and insurance preference. Use up to four times daily as directed. Patient taking differently: daily before breakfast. 12/25/20  Yes Kc, Ramesh, MD  blood glucose meter kit and supplies by Other route as directed. Check blood  sugar every morning   Yes [provider]  clobetasol cream (TEMOVATE) 8.68 % Apply 1 application topically 2 (two) times daily. Patient taking differently: Apply 1 application topically 2 (two) times daily as needed (itching). 08/04/21  Yes Suzan Slick, NP  HYDROcodone-acetaminophen (NORCO/VICODIN) 5-325 MG tablet Take 1 tablet by mouth every 6 (six) hours as needed for moderate pain ((score 4 to 6)). 08/13/21  Yes Newt Minion, MD  hydrOXYzine (VISTARIL) 100 MG capsule Take 1 capsule (100 mg total) by mouth 3 (three) times daily as needed for itching. 08/13/21  Yes Newt Minion, MD  LANTUS SOLOSTAR 100 UNIT/ML Solostar Pen Inject 10 Units into the skin in the morning. 01/08/21  Yes [provider]  NON FORMULARY CPAP  at bedtime   Yes [provider]  predniSONE (DELTASONE) 5 MG tablet Take 5 mg by mouth daily with breakfast. 04/01/19  Yes [provider]  pregabalin (LYRICA) 75 MG capsule Take 75 mg by mouth 2 (two) times daily. 08/19/20  Yes [provider]  furosemide (LASIX) 80 MG tablet Take 160 mg by mouth daily. Patient not taking: Reported on 08/25/2021 04/15/21   [provider]  potassium chloride SA (KLOR-CON) 20 MEQ tablet Take 20 mEq by mouth daily. Patient not taking: Reported on 08/25/2021 04/15/21   [provider]    I have reviewed the patient's current medications.  Labs:  Results for orders placed or performed during the  hospital encounter of 08/24/21 (from the past 48 hour(s))  Resp Panel by RT-PCR (Flu A&B, Covid) Nasopharyngeal Swab     Status: None   Collection Time: 08/24/21 11:46 PM   Specimen: Nasopharyngeal Swab; Nasopharyngeal(NP) swabs in vial transport medium  Result Value Ref Range   SARS Coronavirus 2 by RT PCR NEGATIVE NEGATIVE    Comment: (NOTE) SARS-CoV-2 target nucleic acids are NOT DETECTED.  The SARS-CoV-2 RNA is generally detectable in upper respiratory specimens during the acute phase of infection. The lowest concentration of SARS-CoV-2 viral copies this assay can detect is 138 copies/mL. A negative result does not preclude SARS-Cov-2 infection and should not be used as the sole basis for treatment or other patient management decisions. A negative result may occur with  improper specimen collection/handling, submission of specimen other than nasopharyngeal swab, presence of viral mutation(s) within the areas targeted by this assay, and inadequate number of viral copies(<138 copies/mL). A negative result must be combined with clinical observations, patient history, and epidemiological information. The expected result is Negative.  Fact Sheet for Patients:  EntrepreneurPulse.com.au  Fact Sheet for Healthcare Providers:  IncredibleEmployment.be  This test is no t yet approved or cleared by the [place] FDA and  has been authorized for detection and/or diagnosis of SARS-CoV-2 by FDA under an Emergency Use Authorization (EUA). This EUA will remain  in effect (meaning this test can be used) for the duration of the COVID-19 declaration under Section 564(b)(1) of the Act, 21 U.S.C.section 360bbb-3(b)(1), unless the authorization is terminated  or revoked sooner.       Influenza A by PCR NEGATIVE NEGATIVE   Influenza B by PCR NEGATIVE NEGATIVE    Comment: (NOTE) The Xpert Xpress SARS-CoV-2/FLU/RSV plus assay is intended as an aid in the  diagnosis of influenza from Nasopharyngeal swab specimens and should not be used as a sole basis for treatment. Nasal washings and aspirates are unacceptable for Xpert Xpress SARS-CoV-2/FLU/RSV testing.  Fact Sheet for Patients: EntrepreneurPulse.com.au  Fact Sheet for Healthcare Providers: IncredibleEmployment.be  This test is not yet approved or cleared by the [place] and has been authorized for detection and/or diagnosis of SARS-CoV-2 by FDA under an Emergency Use Authorization (EUA). This EUA will remain in effect (meaning this test can be used) for the duration of the COVID-19 declaration under Section 564(b)(1) of the Act, 21 U.S.C. section 360bbb-3(b)(1), unless the authorization is terminated or revoked.  Performed at [facility], [place] [address], [place], [place] [address]   Basic metabolic panel     Status: Abnormal   Collection Time: 08/24/21 11:50 PM  Result Value Ref Range   Sodium 131 (L) 135 - 145 mmol/L   Potassium 3.7 3.5 - 5.1 mmol/L   Chloride 94 (L) 98 - 111 mmol/L   CO2 25 22 - 32 mmol/L   Glucose, Bld 228 (H) 70 - 99 mg/dL    Comment: Glucose reference range applies only to samples taken after fasting for at least 8 hours.   BUN 57 (H) 8 - 23 mg/dL   Creatinine, Ser 2.46 (H) 0.61 - 1.24 mg/dL   Calcium 8.3 (L) 8.9 - 10.3 mg/dL   GFR, Estimated 26 (L) >60 mL/min    Comment: (NOTE) Calculated using the CKD-EPI Creatinine Equation (2021)    Anion gap 12 5 - 15    Comment: Performed at [facility] [address], [place], [place] [address]  CBC with Differential     Status: Abnormal   Collection Time: 08/24/21 11:50 PM  Result Value Ref Range   WBC 17.1 (H) 4.0 - 10.5 K/uL   RBC 4.42 4.22 - 5.81 MIL/uL   Hemoglobin 12.6 (L) 13.0 - 17.0 g/dL   HCT 38.1 (L) 39.0 - 52.0 %   MCV 86.2 80.0 - 100.0 fL   MCH 28.5 26.0 - 34.0 pg   MCHC 33.1 30.0 - 36.0 g/dL   RDW 15.9 (H) 11.5 - 15.5 %   Platelets  342 150 - 400 K/uL   nRBC 0.0 0.0 - 0.2 %   Neutrophils Relative % 85 %   Neutro Abs 14.5 (H) 1.7 - 7.7 K/uL   Lymphocytes Relative 4 %   Lymphs Abs 0.7 0.7 - 4.0 K/uL   Monocytes Relative 8 %   Monocytes Absolute 1.4 (H) 0.1 - 1.0 K/uL   Eosinophils Relative 1 %   Eosinophils Absolute 0.2 0.0 - 0.5 K/uL   Basophils Relative 1 %   Basophils Absolute 0.1 0.0 - 0.1 K/uL   Immature Granulocytes 1 %   Abs Immature Granulocytes 0.24 (H) 0.00 - 0.07 K/uL    Comment: Performed at [facility], [place] [address], [place], [place] [address]  Brain natriuretic peptide     Status: Abnormal   Collection Time: 08/24/21 11:50 PM  Result Value Ref Range   B Natriuretic Peptide 378.5 (H) 0.0 - 100.0 pg/mL    Comment: Performed at [facility] [address] [place], [place] [address]  I-stat chem 8, ED (not at [facility] or [facility])     Status: Abnormal   Collection Time: 08/25/21 12:04 AM  Result Value Ref Range   Sodium 132 (L) 135 - 145 mmol/L   Potassium 3.7 3.5 - 5.1 mmol/L   Chloride 95 (L) 98 - 111 mmol/L   BUN 55 (H) 8 - 23 mg/dL   Creatinine, Ser 2.40 (H) 0.61 - 1.24 mg/dL   Glucose, Bld 227 (H) 70 - 99 mg/dL    Comment: Glucose reference range  applies only to samples taken after fasting for at least 8 hours.   Calcium, Ion 1.03 (L) 1.15 - 1.40 mmol/L   TCO2 30 22 - 32 mmol/L   Hemoglobin 12.6 (L) 13.0 - 17.0 g/dL   HCT 37.0 (L) 39.0 - 52.0 %  I-Stat venous blood gas, [facility] ED)     Status: Abnormal   Collection Time: 08/25/21 12:06 AM  Result Value Ref Range   pH, Ven 7.488 (H) 7.250 - 7.430   pCO2, Ven 41.1 (L) 44.0 - 60.0 mmHg   pO2, Ven 78.0 (H) 32.0 - 45.0 mmHg   Bicarbonate 31.2 (H) 20.0 - 28.0 mmol/L   TCO2 32 22 - 32 mmol/L   O2 Saturation 96.0 %   Acid-Base Excess 7.0 (H) 0.0 - 2.0 mmol/L   Sodium 132 (L) 135 - 145 mmol/L   Potassium 3.7 3.5 - 5.1 mmol/L   Calcium, Ion 1.02 (L) 1.15 - 1.40 mmol/L   HCT 38.0 (L) 39.0 - 52.0 %   Hemoglobin 12.9 (L) 13.0 - 17.0 g/dL    Sample type VENOUS   Troponin I (High Sensitivity)     Status: Abnormal   Collection Time: 08/25/21  2:05 AM  Result Value Ref Range   Troponin I (High Sensitivity) 328 (HH) <18 ng/L    Comment: CRITICAL RESULT CALLED TO, READ BACK BY AND VERIFIED WITH: Ronalee Belts RN 08/25/21 0308 M KOROLESKI (NOTE) Elevated high sensitivity troponin I (hsTnI) values and significant  changes across serial measurements may suggest ACS but many other  chronic and acute conditions are known to elevate hsTnI results.  Refer to the Links section for chest pain algorithms and additional  guidance. Performed at [facility], [place] [address], [place], [place] [address]   Magnesium     Status: None   Collection Time: 08/25/21  3:05 AM  Result Value Ref Range   Magnesium 2.2 1.7 - 2.4 mg/dL    Comment: Performed at [facility] [address], [place], [place] [address]  Comprehensive metabolic panel     Status: Abnormal   Collection Time: 08/25/21  3:05 AM  Result Value Ref Range   Sodium 134 (L) 135 - 145 mmol/L   Potassium 3.8 3.5 - 5.1 mmol/L   Chloride 97 (L) 98 - 111 mmol/L   CO2 27 22 - 32 mmol/L   Glucose, Bld 213 (H) 70 - 99 mg/dL    Comment: Glucose reference range applies only to samples taken after fasting for at least 8 hours.   BUN 54 (H) 8 - 23 mg/dL   Creatinine, Ser 2.43 (H) 0.61 - 1.24 mg/dL   Calcium 7.8 (L) 8.9 - 10.3 mg/dL   Total Protein 5.6 (L) 6.5 - 8.1 g/dL   Albumin 2.5 (L) 3.5 - 5.0 g/dL   AST 21 15 - 41 U/L   ALT 16 0 - 44 U/L   Alkaline Phosphatase 112 38 - 126 U/L   Total Bilirubin 1.4 (H) 0.3 - 1.2 mg/dL   GFR, Estimated 26 (L) >60 mL/min    Comment: (NOTE) Calculated using the CKD-EPI Creatinine Equation (2021)    Anion gap 10 5 - 15    Comment: Performed at [facility], [place] [address] [place], [place] [address]  CBC with Differential/Platelet     Status: Abnormal   Collection Time: 08/25/21  3:05 AM  Result Value Ref Range   WBC 16.6  (H) 4.0 - 10.5 K/uL   RBC 4.03 (L)  4.22 - 5.81 MIL/uL   Hemoglobin 11.5 (L) 13.0 - 17.0 g/dL   HCT 34.6 (L) 39.0 - 52.0 %   MCV 85.9 80.0 - 100.0 fL   MCH 28.5 26.0 - 34.0 pg   MCHC 33.2 30.0 - 36.0 g/dL   RDW 16.0 (H) 11.5 - 15.5 %   Platelets 324 150 - 400 K/uL   nRBC 0.0 0.0 - 0.2 %   Neutrophils Relative % 82 %   Neutro Abs 13.5 (H) 1.7 - 7.7 K/uL   Lymphocytes Relative 6 %   Lymphs Abs 1.0 0.7 - 4.0 K/uL   Monocytes Relative 9 %   Monocytes Absolute 1.6 (H) 0.1 - 1.0 K/uL   Eosinophils Relative 1 %   Eosinophils Absolute 0.2 0.0 - 0.5 K/uL   Basophils Relative 1 %   Basophils Absolute 0.1 0.0 - 0.1 K/uL   Immature Granulocytes 1 %   Abs Immature Granulocytes 0.21 (H) 0.00 - 0.07 K/uL    Comment: Performed at [facility], [address] [place], [place] [address]  Hemoglobin A1c     Status: Abnormal   Collection Time: 08/25/21  3:05 AM  Result Value Ref Range   Hgb A1c MFr Bld 8.2 (H) 4.8 - 5.6 %    Comment: (NOTE)         Prediabetes: 5.7 - 6.4         Diabetes: >6.4         Glycemic control for adults with diabetes: <7.0    Mean Plasma Glucose 189 mg/dL    Comment: (NOTE) Performed At: [facility] [place], [place] [id number] Rush Farmer MD [id number]   CBG monitoring, ED     Status: Abnormal   Collection Time: 08/25/21  8:13 AM  Result Value Ref Range   Glucose-Capillary 203 (H) 70 - 99 mg/dL    Comment: Glucose reference range applies only to samples taken after fasting for at least 8 hours.  Heparin level (unfractionated)     Status: Abnormal   Collection Time: 08/25/21  9:05 AM  Result Value Ref Range   Heparin Unfractionated <0.10 (L) 0.30 - 0.70 IU/mL    Comment: (NOTE) The clinical reportable range upper limit is being lowered to >1.10 to align with the FDA approved guidance for the current laboratory assay.  If heparin results are below expected values, and patient dosage has  been confirmed, suggest follow up  testing of antithrombin III levels. Performed at [facility], [place] [address] [place], [place] [address]   TSH     Status: None   Collection Time: 08/25/21  9:05 AM  Result Value Ref Range   TSH 4.104 0.350 - 4.500 uIU/mL    Comment: Performed by a 3rd Generation assay with a functional sensitivity of <=0.01 uIU/mL. Performed at [facility], [place] [address], [place], [place] [address]   Protime-INR     Status: Abnormal   Collection Time: 08/25/21  9:05 AM  Result Value Ref Range   Prothrombin Time 15.9 (H) 11.4 - 15.2 seconds   INR 1.3 (H) 0.8 - 1.2    Comment: (NOTE) INR goal varies based on device and disease states. Performed at [facility], [place] [address], [place], [place] [address]   CBG monitoring, ED     Status: Abnormal   Collection Time: 08/25/21 11:27 AM  Result Value Ref Range   Glucose-Capillary 178 (H) 70 - 99 mg/dL    Comment:  Glucose reference range applies only to samples taken after fasting for at least 8 hours.   Comment 1 Notify RN    Comment 2 Document in Chart   Troponin I (High Sensitivity)     Status: Abnormal   Collection Time: 08/25/21 12:33 PM  Result Value Ref Range   Troponin I (High Sensitivity) 409 (HH) <18 ng/L    Comment: CRITICAL VALUE NOTED.  VALUE IS CONSISTENT WITH PREVIOUSLY REPORTED AND CALLED VALUE. (NOTE) Elevated high sensitivity troponin I (hsTnI) values and significant  changes across serial measurements may suggest ACS but many other  chronic and acute conditions are known to elevate hsTnI results.  Refer to the Links section for chest pain algorithms and additional  guidance. Performed at [facility], [place] [address], [place], [place] [address]   Troponin I (High Sensitivity)     Status: Abnormal   Collection Time: 08/25/21  3:01 PM  Result Value Ref Range   Troponin I (High Sensitivity) 408 (HH) <18 ng/L    Comment: CRITICAL VALUE NOTED.  VALUE IS CONSISTENT WITH PREVIOUSLY REPORTED AND CALLED  VALUE. (NOTE) Elevated high sensitivity troponin I (hsTnI) values and significant  changes across serial measurements may suggest ACS but many other  chronic and acute conditions are known to elevate hsTnI results.  Refer to the Links section for chest pain algorithms and additional  guidance. Performed at [facility], [place] [address], [place], [place] [address]   CBG monitoring, ED     Status: Abnormal   Collection Time: 08/25/21  5:03 PM  Result Value Ref Range   Glucose-Capillary 185 (H) 70 - 99 mg/dL    Comment: Glucose reference range applies only to samples taken after fasting for at least 8 hours.   Comment 1 Notify RN    Comment 2 Document in Chart   Heparin level (unfractionated)     Status: Abnormal   Collection Time: 08/25/21  9:00 PM  Result Value Ref Range   Heparin Unfractionated 0.13 (L) 0.30 - 0.70 IU/mL    Comment: (NOTE) The clinical reportable range upper limit is being lowered to >1.10 to align with the FDA approved guidance for the current laboratory assay.  If heparin results are below expected values, and patient dosage has  been confirmed, suggest follow up testing of antithrombin III levels. Performed at [facility], [place] [address], [place],  [address]   CBG monitoring, ED     Status: Abnormal   Collection Time: 08/25/21  9:38 PM  Result Value Ref Range   Glucose-Capillary 140 (H) 70 - 99 mg/dL    Comment: Glucose reference range applies only to samples taken after fasting for at least 8 hours.  Basic metabolic panel     Status: Abnormal   Collection Time: 08/26/21  6:15 AM  Result Value Ref Range   Sodium 132 (L) 135 - 145 mmol/L   Potassium 3.6 3.5 - 5.1 mmol/L   Chloride 96 (L) 98 - 111 mmol/L   CO2 26 22 - 32 mmol/L   Glucose, Bld 156 (H) 70 - 99 mg/dL    Comment: Glucose reference range applies only to samples taken after fasting for at least 8 hours.   BUN 63 (H) 8 - 23 mg/dL   Creatinine, Ser 2.76 (H) 0.61 - 1.24 mg/dL    Calcium 8.2 (L) 8.9 - 10.3 mg/dL   GFR, Estimated 23 (L) >60 mL/min    Comment: (NOTE) Calculated using the CKD-EPI Creatinine  Equation (2021)    Anion gap 10 5 - 15    Comment: Performed at [facility], [place] [address], [place], [place] [address]  CBC     Status: Abnormal   Collection Time: 08/26/21  6:15 AM  Result Value Ref Range   WBC 14.4 (H) 4.0 - 10.5 K/uL   RBC 4.12 (L) 4.22 - 5.81 MIL/uL   Hemoglobin 11.7 (L) 13.0 - 17.0 g/dL   HCT 36.2 (L) 39.0 - 52.0 %   MCV 87.9 80.0 - 100.0 fL   MCH 28.4 26.0 - 34.0 pg   MCHC 32.3 30.0 - 36.0 g/dL   RDW 16.1 (H) 11.5 - 15.5 %   Platelets 300 150 - 400 K/uL   nRBC 0.0 0.0 - 0.2 %    Comment: Performed at [facility] [address] [place], [place] [address]  Heparin level (unfractionated)     Status: None   Collection Time: 08/26/21  6:15 AM  Result Value Ref Range   Heparin Unfractionated 0.46 0.30 - 0.70 IU/mL    Comment: (NOTE) The clinical reportable range upper limit is being lowered to >1.10 to align with the FDA approved guidance for the current laboratory assay.  If heparin results are below expected values, and patient dosage has  been confirmed, suggest follow up testing of antithrombin III levels. Performed at [facility], [place] [address] [place], [place] [address]   Lipid panel     Status: None   Collection Time: 08/26/21  6:15 AM  Result Value Ref Range   Cholesterol 149 0 - 200 mg/dL   Triglycerides 76 <150 mg/dL   HDL 64 >40 mg/dL   Total CHOL/HDL Ratio 2.3 RATIO   VLDL 15 0 - 40 mg/dL   LDL Cholesterol 70 0 - 99 mg/dL    Comment:        Total Cholesterol/HDL:CHD Risk Coronary Heart Disease Risk Table                     Men   Women  1/2 Average Risk   3.4   3.3  Average Risk       5.0   4.4  2 X Average Risk   9.6   7.1  3 X Average Risk  23.4   11.0        Use the calculated Patient Ratio above and the CHD Risk Table to determine the patient's CHD Risk.        ATP III  CLASSIFICATION (LDL):  <100     mg/dL   Optimal  100-129  mg/dL   Near or Above                    Optimal  130-159  mg/dL   Borderline  160-189  mg/dL   High  >190     mg/dL   Very High Performed at [facility] [address], [place], [place] [address]   Brain natriuretic peptide     Status: Abnormal   Collection Time: 08/26/21  6:15 AM  Result Value Ref Range   B Natriuretic Peptide 577.1 (H) 0.0 - 100.0 pg/mL    Comment: Performed at [facility] [address] [place], [place] [address]  CBG monitoring, ED     Status: Abnormal   Collection Time: 08/26/21  8:10 AM  Result Value Ref Range   Glucose-Capillary 149 (H) 70 - 99 mg/dL  Comment: Glucose reference range applies only to samples taken after fasting for at least 8 hours.  Heparin level (unfractionated)     Status: None   Collection Time: 08/26/21  1:10 PM  Result Value Ref Range   Heparin Unfractionated 0.38 0.30 - 0.70 IU/mL    Comment: (NOTE) The clinical reportable range upper limit is being lowered to >1.10 to align with the FDA approved guidance for the current laboratory assay.  If heparin results are below expected values, and patient dosage has  been confirmed, suggest follow up testing of antithrombin III levels. Performed at [facility], [place] [address] [place], [place] [address]   CBG monitoring, ED     Status: Abnormal   Collection Time: 08/26/21  1:32 PM  Result Value Ref Range   Glucose-Capillary 196 (H) 70 - 99 mg/dL    Comment: Glucose reference range applies only to samples taken after fasting for at least 8 hours.     ROS:  A comprehensive review of systems was negative except for: Respiratory: positive for dyspnea on exertion Cardiovascular: positive for dyspnea, irregular heart beat, lower extremity edema, and paroxysmal nocturnal dyspnea  Physical Exam: Vitals:   08/26/21 1454 08/26/21 1531  BP:  126/65  Pulse: 77 81  Resp: (!) 24 20  Temp: 98.3 F (36.8 C)  98 F (36.7 C)  SpO2: 97% 94%     General: pleasant, obese WM-  rapid resp rate -  cannot complete full sentences HEENT: PERRLA, EOMI, mucous membranes moist Neck: positive JVD Heart: irreg Lungs: dec BS at bases Abdomen: distended, non tender Extremities: pitting edema to dep areas Skin: warm and dry Neuro: alert, non focal   Assessment/Plan: 81 year old WM with many medical issues including stage 4 CKD.  He has had a disruption in his equilibrium-  not sure if due to primary cardiac cause or stopping his lasix but now has pulmonary edema that is symptomatic 1.Renal- baseline had CKD with crt as high as the mid 2's-  is now 2.7 in the setting of decreased EF-  some type of atrial arrythmia.  He denies any LUTS-  I suspect the slightly higher crt is as a result of hemodynamic instability.  I have put some parameters on his hydralazine and lopressor to make sure that his BP does not get too low and give him/us ATN.  Have sent off a U/A to look at other etiologies for A on CRF.  There are no compelling needs for HD and could be difficult in this gentleman given his constellation of issues 2. Hypertension/volume  - he is overloaded-  thinks he feels better after lasix has been dosed-  will increase to 80 q 6 hours at least for the next 24hours to give him more relief 4. Anemia  - is not  a major issue at this time    Louis Meckel 08/26/2021, 3:53 PM

## 2021-08-26 NOTE — ED Notes (Signed)
PT requesting to use RR. RN counseled repeatedly that pt should not be getting up to use RR. RN was adamant that pt should use bed pan or bedside commode for pt wellbeing. Pt refused. Pt verbalized understanding that if pt chooses to use RR that it may affect pt safety.

## 2021-08-26 NOTE — Progress Notes (Signed)
[facility] for heparin Indication: atrial fibrillation  Allergies  Allergen Reactions   Oxycodone Hcl Other (See Comments)    Other reaction(s): "out of it" per pt   Ace Inhibitors Cough    Other reaction(s): cough   Lisinopril Cough   Oxycodone Other (See Comments)    "out of it"     Patient Measurements: Height: 5' 8.25" (173.4 cm) Weight: 102.3 kg (225 lb 9.6 oz) IBW/kg (Calculated) : 68.98 Heparin Dosing Weight: 90kg  Vital Signs: BP: 119/67 (01/25 1100) Pulse Rate: 84 (01/25 1100)  Labs: Recent Labs    08/24/21 2350 08/25/21 0004 08/25/21 0006 08/25/21 0205 08/25/21 0305 08/25/21 0905 08/25/21 0905 08/25/21 1233 08/25/21 1501 08/25/21 2100 08/26/21 0615 08/26/21 1310  HGB 12.6* 12.6* 12.9*  --  11.5*  --   --   --   --   --  11.7*  --   HCT 38.1* 37.0* 38.0*  --  34.6*  --   --   --   --   --  36.2*  --   PLT 342  --   --   --  324  --   --   --   --   --  300  --   LABPROT  --   --   --   --   --  15.9*  --   --   --   --   --   --   INR  --   --   --   --   --  1.3*  --   --   --   --   --   --   HEPARINUNFRC  --   --   --   --   --  <0.10*   < >  --   --  0.13* 0.46 0.38  CREATININE 2.46* 2.40*  --   --  2.43*  --   --   --   --   --  2.76*  --   TROPONINIHS  --   --   --  328*  --   --   --  409* 408*  --   --   --    < > = values in this interval not displayed.     Estimated Creatinine Clearance: 25.3 mL/min (A) (by C-G formula based on SCr of 2.76 mg/dL (H)).   Medical History: Past Medical History:  Diagnosis Date   Arthritis    all joints   Chronic diastolic CHF (congestive heart failure) (HCC)    Chronic kidney disease (CKD), stage IV (severe) (HCC)    Chronic kidney disease, stage 3 (HCC)    Congestive heart failure with LV diastolic dysfunction, NYHA class 2 (HCC)    Family history of adverse reaction to anesthesia    father- change in personaility   GERD (gastroesophageal reflux disease)     Hypertension    Hypertension associated with diabetes ([place])    Hypertensive chronic kidney disease    Insulin dependent type 2 diabetes mellitus (HCC)    Neuropathy    OSA on CPAP    Sleep apnea    uses cpap, pt does not know settings    Assessment: 81yo male admitted for acute CHF and new Afib w/ hypoxia to begin heparin.  Heparin level previously therapeutic at 2000 units/hr. Confirmatory level back at 0.38.  Goal of Therapy:  Heparin level 0.3-0.7 units/ml Monitor platelets by anticoagulation protocol: Yes  Plan:  Continue IV heparin gtt at 2000 units/h Confirm with AM level Daily heparin level, CBC Monitor for signs and symptoms of bleeding Follow-up plans for long-term oral anticoagulant  Lorelei Pont, PharmD, BCPS 08/26/2021 2:46 PM ED Clinical Pharmacist -  [phone]

## 2021-08-26 NOTE — TOC Progression Note (Signed)
Transition of Care [facility]) - Progression Note    Patient Details  Name: NIKAI QUEST MRN: [id number] Date of Birth: 18-Apr-1941  Transition of [facility]) CM/SW Contact  Zenon Mayo, RN Phone Number: 08/26/2021, 3:48 PM  Clinical Narrative:     Transition of Care [facility]) Screening Note   Patient Details  Name: JAXSON ANGLIN Date of Birth: 1941-01-27   Transition of Care [facility]) CM/SW Contact:    Zenon Mayo, RN Phone Number: 08/26/2021, 3:48 PM    Transition of Care Department [facility]) has reviewed patient and no TOC needs have been identified at this time. We will continue to monitor patient advancement through interdisciplinary progression rounds. If new patient transition needs arise, please place a TOC consult.          Expected Discharge Plan and Services                                                 Social Determinants of Health (SDOH) Interventions    Readmission Risk Interventions No flowsheet data found.

## 2021-08-26 NOTE — ED Notes (Signed)
Dr.[place] at bedside and weaned off pt O2 from nonrebreather to room air. Pt maintained O2 sat at 94% on RA but is tachypneic at 20s cpm. MD started pt on 2LPM [place] at this time. O2 at 94% on 2LPM [place]. Will continue to monitor.

## 2021-08-27 ENCOUNTER — Encounter (HOSPITAL_COMMUNITY): Payer: Self-pay | Admitting: Internal Medicine

## 2021-08-27 DIAGNOSIS — N184 Chronic kidney disease, stage 4 (severe): Secondary | ICD-10-CM | POA: Diagnosis not present

## 2021-08-27 DIAGNOSIS — J9601 Acute respiratory failure with hypoxia: Secondary | ICD-10-CM | POA: Diagnosis not present

## 2021-08-27 DIAGNOSIS — N179 Acute kidney failure, unspecified: Secondary | ICD-10-CM | POA: Diagnosis not present

## 2021-08-27 DIAGNOSIS — I5033 Acute on chronic diastolic (congestive) heart failure: Secondary | ICD-10-CM | POA: Diagnosis not present

## 2021-08-27 LAB — RENAL FUNCTION PANEL
Albumin: 2.4 g/dL — ABNORMAL LOW (ref 3.5–5.0)
Anion gap: 14 (ref 5–15)
BUN: 78 mg/dL — ABNORMAL HIGH (ref 8–23)
CO2: 28 mmol/L (ref 22–32)
Calcium: 8.5 mg/dL — ABNORMAL LOW (ref 8.9–10.3)
Chloride: 97 mmol/L — ABNORMAL LOW (ref 98–111)
Creatinine, Ser: 2.97 mg/dL — ABNORMAL HIGH (ref 0.61–1.24)
GFR, Estimated: 21 mL/min — ABNORMAL LOW (ref >60)
Glucose, Bld: 138 mg/dL — ABNORMAL HIGH (ref 70–99)
Phosphorus: 5.1 mg/dL — ABNORMAL HIGH (ref 2.5–4.6)
Potassium: 3.5 mmol/L (ref 3.5–5.1)
Sodium: 139 mmol/L (ref 135–145)

## 2021-08-27 LAB — CBC
HCT: 34.4 % — ABNORMAL LOW (ref 39.0–52.0)
Hemoglobin: 11.2 g/dL — ABNORMAL LOW (ref 13.0–17.0)
MCH: 28.1 pg (ref 26.0–34.0)
MCHC: 32.6 g/dL (ref 30.0–36.0)
MCV: 86.4 fL (ref 80.0–100.0)
Platelets: 329 10*3/uL (ref 150–400)
RBC: 3.98 MIL/uL — ABNORMAL LOW (ref 4.22–5.81)
RDW: 16.3 % — ABNORMAL HIGH (ref 11.5–15.5)
WBC: 10.2 10*3/uL (ref 4.0–10.5)
nRBC: 0 % (ref 0.0–0.2)

## 2021-08-27 LAB — GLUCOSE, CAPILLARY
Glucose-Capillary: 133 mg/dL — ABNORMAL HIGH (ref 70–99)
Glucose-Capillary: 135 mg/dL — ABNORMAL HIGH (ref 70–99)
Glucose-Capillary: 122 mg/dL — ABNORMAL HIGH (ref 70–99)
Glucose-Capillary: 145 mg/dL — ABNORMAL HIGH (ref 70–99)

## 2021-08-27 LAB — HEPARIN LEVEL (UNFRACTIONATED): Heparin Unfractionated: 0.45 IU/mL (ref 0.30–0.70)

## 2021-08-27 MED ORDER — ISOSORBIDE DINITRATE 10 MG PO TABS: 20.0000 mg | ORAL_TABLET | Freq: Three times a day (TID) | ORAL | Status: DC

## 2021-08-27 MED ORDER — ATORVASTATIN CALCIUM 10 MG PO TABS
20.0000 mg | ORAL_TABLET | Freq: Every day | ORAL | Status: DC
  Administered 2021-08-27 – 2021-08-28 (×2): 20 mg via ORAL
  Filled 2021-08-27 (×2): qty 2

## 2021-08-27 MED ORDER — INSULIN ASPART 100 UNIT/ML IJ SOLN
0.0000 [IU] | Freq: Three times a day (TID) | INTRAMUSCULAR | Status: DC
  Administered 2021-08-27 – 2021-08-28 (×5): 2 [IU] via SUBCUTANEOUS

## 2021-08-27 MED ORDER — HYDRALAZINE HCL 10 MG PO TABS
10.0000 mg | ORAL_TABLET | Freq: Three times a day (TID) | ORAL | Status: DC
  Administered 2021-08-27: 10 mg via ORAL
  Filled 2021-08-27 (×2): qty 1

## 2021-08-27 MED ORDER — ASPIRIN EC 81 MG PO TBEC
81.0000 mg | DELAYED_RELEASE_TABLET | Freq: Every day | ORAL | Status: DC
  Administered 2021-08-27 – 2021-08-28 (×2): 81 mg via ORAL
  Filled 2021-08-27 (×2): qty 1

## 2021-08-27 MED ORDER — HYDRALAZINE HCL 25 MG PO TABS: 25.0000 mg | ORAL_TABLET | Freq: Three times a day (TID) | ORAL | Status: DC

## 2021-08-27 MED ORDER — ISOSORBIDE DINITRATE 10 MG PO TABS
10.0000 mg | ORAL_TABLET | Freq: Three times a day (TID) | ORAL | Status: DC
  Administered 2021-08-27 – 2021-08-28 (×3): 10 mg via ORAL
  Filled 2021-08-27 (×3): qty 1

## 2021-08-27 MED ORDER — HEPARIN SODIUM (PORCINE) 5000 UNIT/ML IJ SOLN
5000.0000 [IU] | Freq: Three times a day (TID) | INTRAMUSCULAR | Status: DC
  Administered 2021-08-27 – 2021-08-28 (×4): 5000 [IU] via SUBCUTANEOUS
  Filled 2021-08-27 (×4): qty 1

## 2021-08-27 MED ORDER — ORAL CARE MOUTH RINSE
15.0000 mL | Freq: Two times a day (BID) | OROMUCOSAL | Status: DC
  Administered 2021-08-27 – 2021-08-28 (×2): 15 mL via OROMUCOSAL

## 2021-08-27 MED ORDER — POTASSIUM CHLORIDE CRYS ER 20 MEQ PO TBCR
40.0000 meq | EXTENDED_RELEASE_TABLET | Freq: Once | ORAL | Status: AC
  Administered 2021-08-27: 40 meq via ORAL
  Filled 2021-08-27: qty 2

## 2021-08-27 NOTE — Progress Notes (Signed)
Subjective:  825 UOP recorded-  negative 550.  HR controlled-  BP soft -  BUN and crt up a little -  urine bland.  I see him on the way to the [facility]-  "not 100% but I feel a whole lot better "   Objective Vital signs in last 24 hours: Vitals:   08/26/21 1951 08/26/21 2036 08/27/21 0010 08/27/21 0632  BP: 122/61  108/60 105/66  Pulse: (!) 110     Resp: 18  20 20   Temp: 98.6 F (37 C)   98.3 F (36.8 C)  TempSrc: Oral   Oral  SpO2: 92% 94% 94% 95%  Weight:    102.7 kg  Height:       Weight change:   Intake/Output Summary (Last 24 hours) at 08/27/2021 0838 Last data filed at 08/27/2021 0630 Gross per 24 hour  Intake 387.83 ml  Output 1350 ml  Net -962.17 ml    Assessment/Plan: 81 year old WM with many medical issues including stage 4 CKD.  He has had a disruption in his equilibrium-  not sure if due to primary cardiac cause or stopping his lasix but now has pulmonary edema that is symptomatic 1.Renal- baseline had CKD with crt as high as the mid 2's-  then 2.7 in the setting of decreased EF-  some type of atrial arrythmia.  He denies any LUTS-  I suspect the slightly higher crt is as a result of hemodynamic instability.  I have put some parameters on his hydralazine and lopressor to make sure that his BP does not get too low and give him ATN.   U/A s bland so c/w cardiorenal syndrome  There are no compelling needs for HD and could be difficult in this gentleman given his constellation of issues.  Crt up a little today -  not great but again no HD needs-  hope will eventually stabilize and improve back toward baseline 2. Hypertension/volume  - he is overloaded-  thinks he feels better after lasix has been dosed-  increased to 80 q 6 hours at least for the next 24hours to give him more relief-   not a robust response numbers wise but clinically improved -  cont same dosing for now  3. Hypokalemia-  will give a dose of k  4. Anemia  - is not  a major issue at this time    [place]: Basic Metabolic Panel: Recent Labs  Lab 08/25/21 0305 08/26/21 0615 08/27/21 0426  NA 134* 132* 139  K 3.8 3.6 3.5  CL 97* 96* 97*  CO2 27 26 28   GLUCOSE 213* 156* 138*  BUN 54* 63* 78*  CREATININE 2.43* 2.76* 2.97*  CALCIUM 7.8* 8.2* 8.5*  PHOS  --   --  5.1*   Liver Function Tests: Recent Labs  Lab 08/25/21 0305 08/27/21 0426  AST 21  --   ALT 16  --   ALKPHOS 112  --   BILITOT 1.4*  --   PROT 5.6*  --   ALBUMIN 2.5* 2.4*   No results for input(s): LIPASE, AMYLASE in the last 168 hours. No results for input(s): AMMONIA in the last 168 hours. CBC: Recent Labs  Lab 08/24/21 2350 08/25/21 0004 08/25/21 0305 08/26/21 0615 08/27/21 0426  WBC 17.1*  --  16.6* 14.4* 10.2  NEUTROABS 14.5*  --  13.5*  --   --   HGB 12.6*   < > 11.5* 11.7* 11.2*  HCT  38.1*   < > 34.6* 36.2* 34.4*  MCV 86.2  --  85.9 87.9 86.4  PLT 342  --  324 300 329   < > = values in this interval not displayed.   Cardiac Enzymes: No results for input(s): CKTOTAL, CKMB, CKMBINDEX, TROPONINI in the last 168 hours. CBG: Recent Labs  Lab 08/26/21 0810 08/26/21 1332 08/26/21 1716 08/26/21 2120 08/27/21 0643  GLUCAP 149* 196* 112* 134* 133*    Iron Studies: No results for input(s): IRON, TIBC, TRANSFERRIN, FERRITIN in the last 72 hours. Studies/Results: ECHOCARDIOGRAM COMPLETE  Result Date: 08/25/2021    ECHOCARDIOGRAM REPORT   Patient Name:   Robert Dickerson Date of Exam: 08/25/2021 Medical Rec #:  [id number]     Height:       68.3 in Accession #:    [id number]    Weight:       225.6 lb Date of Birth:  [birth year]    BSA:          2.157 m Patient Age:    58 years      BP:           110/64 mmHg Patient Gender: M             HR:           81 bpm. Exam Location:  Inpatient Procedure: 2D Echo Indications:    Atrial fibrillation  History:        Patient has prior history of Echocardiogram examinations, most                 recent 09/04/2013. Risk Factors:Hypertension and Diabetes.   Sonographer:    Jefferey Pica Referring Phys: [place]  1. Left ventricular ejection fraction, by estimation, is 30 to 35%. The left ventricle has moderately decreased function. The left ventricle demonstrates global hypokinesis. The left ventricular internal cavity size was mildly dilated. There is mild concentric left ventricular hypertrophy. Left ventricular diastolic parameters are indeterminate.  2. Right ventricular systolic function is normal. The right ventricular size is normal.  3. The mitral valve is grossly normal. No evidence of mitral valve regurgitation.  4. The aortic valve was not well visualized. Aortic valve regurgitation is not visualized. No aortic stenosis is present.  5. The inferior vena cava is dilated in size with <50% respiratory variability, suggesting right atrial pressure of 15 mmHg. FINDINGS  Left Ventricle: Left ventricular ejection fraction, by estimation, is 30 to 35%. The left ventricle has moderately decreased function. The left ventricle demonstrates global hypokinesis. The left ventricular internal cavity size was mildly dilated. There is mild concentric left ventricular hypertrophy. Left ventricular diastolic parameters are indeterminate. Right Ventricle: The right ventricular size is normal. No increase in right ventricular wall thickness. Right ventricular systolic function is normal. Left Atrium: Left atrial size was normal in size. Right Atrium: Right atrial size was normal in size. Pericardium: Trivial pericardial effusion is present. Mitral Valve: The mitral valve is grossly normal. No evidence of mitral valve regurgitation. Tricuspid Valve: The tricuspid valve is normal in structure. Tricuspid valve regurgitation is not demonstrated. No evidence of tricuspid stenosis. Aortic Valve: The aortic valve was not well visualized. Aortic valve regurgitation is not visualized. No aortic stenosis is present. Aortic valve peak gradient measures 3.2 mmHg.  Pulmonic Valve: The pulmonic valve was normal in structure. Pulmonic valve regurgitation is not visualized. No evidence of pulmonic stenosis. Aorta: The aortic root and ascending aorta are structurally normal, with no evidence of  dilitation. Venous: The inferior vena cava is dilated in size with less than 50% respiratory variability, suggesting right atrial pressure of 15 mmHg. IAS/Shunts: No atrial level shunt detected by color flow Doppler.  LEFT VENTRICLE PLAX 2D LVIDd:         6.00 cm   Diastology LVIDs:         5.10 cm   LV e' lateral:   5.25 cm/s LV PW:         1.10 cm   LV E/e' lateral: 21.9 LV IVS:        1.10 cm LVOT diam:     2.10 cm LV SV:         56 LV SV Index:   26 LVOT Area:     3.46 cm  RIGHT VENTRICLE          IVC RV Basal diam:  3.40 cm  IVC diam: 2.40 cm TAPSE (M-mode): 1.8 cm LEFT ATRIUM             Index        RIGHT ATRIUM           Index LA diam:        4.00 cm 1.85 cm/m   RA Area:     13.90 cm LA Vol (A2C):   80.7 ml 37.41 ml/m  RA Volume:   36.00 ml  16.69 ml/m LA Vol (A4C):   61.3 ml 28.41 ml/m LA Biplane Vol: 70.5 ml 32.68 ml/m  AORTIC VALVE                 PULMONIC VALVE AV Area (Vmax): 3.31 cm     PV Vmax:       0.57 m/s AV Vmax:        88.80 cm/s   PV Peak grad:  1.3 mmHg AV Peak Grad:   3.2 mmHg LVOT Vmax:      84.90 cm/s LVOT Vmean:     45.900 cm/s LVOT VTI:       0.163 m  AORTA Ao Root diam: 3.70 cm Ao Asc diam:  3.30 cm MITRAL VALVE MV Area (PHT): 3.79 cm     SHUNTS MV Decel Time: 200 msec     Systemic VTI:  0.16 m MV E velocity: 115.00 cm/s  Systemic Diam: 2.10 cm MV A velocity: 107.00 cm/s MV E/A ratio:  1.07 Rudean Haskell MD Electronically signed by Rudean Haskell MD Signature Date/Time: 08/25/2021/3:00:07 PM    Final    Medications: Infusions:  heparin 2,000 Units/hr (08/27/21 0733)    Scheduled Medications:  allopurinol  150 mg Oral QHS   aspirin EC  81 mg Oral Daily   furosemide  80 mg Intravenous Q6H   hydrALAZINE  10 mg Oral Q8H   insulin  aspart  0-15 Units Subcutaneous TID WC   insulin aspart  0-5 Units Subcutaneous QHS   insulin glargine-yfgn  10 Units Subcutaneous Daily   isosorbide dinitrate  10 mg Oral TID   mouth rinse  15 mL Mouth Rinse BID   metoprolol tartrate  25 mg Oral BID   predniSONE  5 mg Oral Q breakfast   pregabalin  75 mg Oral BID    have reviewed scheduled and prn medications.  Physical Exam: General: -  breathing better  Heart: rate controlled Lungs: improved air movement Abdomen: soft, non tender Extremities: pitting edema     08/27/2021,8:38 AM  LOS: 2 days

## 2021-08-27 NOTE — Progress Notes (Signed)
Pt is currently on home unit.

## 2021-08-27 NOTE — Progress Notes (Signed)
ANTICOAGULATION CONSULT NOTE- follow-up  Pharmacy Consult for heparin Indication: atrial fibrillation  Allergies  Allergen Reactions   Oxycodone Hcl Other (See Comments)    Other reaction(s): "out of it" per pt   Ace Inhibitors Cough    Other reaction(s): cough   Lisinopril Cough   Oxycodone Other (See Comments)    "out of it"     Patient Measurements: Height: 5\' 9"  (175.3 cm) Weight: 102.7 kg (226 lb 8 oz) (scale C) IBW/kg (Calculated) : 70.7 Heparin Dosing Weight: 90kg  Vital Signs: Temp: 98.3 F (36.8 C) (01/26 0632) Temp Source: Oral (01/26 0632) BP: 105/66 (01/26 4854) Pulse Rate: 110 (01/25 1951)  Labs: Recent Labs    08/25/21 0205 08/25/21 0305 08/25/21 0905 08/25/21 1233 08/25/21 1501 08/25/21 2100 08/26/21 0615 08/26/21 1310 08/27/21 0426  HGB  --  11.5*  --   --   --   --  11.7*  --  11.2*  HCT  --  34.6*  --   --   --   --  36.2*  --  34.4*  PLT  --  324  --   --   --   --  300  --  329  LABPROT  --   --  15.9*  --   --   --   --   --   --   INR  --   --  1.3*  --   --   --   --   --   --   HEPARINUNFRC  --   --  <0.10*  --   --    < > 0.46 0.38 0.45  CREATININE  --  2.43*  --   --   --   --  2.76*  --  2.97*  TROPONINIHS 328*  --   --  409* 408*  --   --   --   --    < > = values in this interval not displayed.     Estimated Creatinine Clearance: 23.8 mL/min (A) (by C-G formula based on SCr of 2.97 mg/dL (H)).   Medical History: Past Medical History:  Diagnosis Date   Arthritis    all joints   Chronic diastolic CHF (congestive heart failure) (HCC)    Chronic kidney disease (CKD), stage IV (severe) (HCC)    Chronic kidney disease, stage 3 (HCC)    Congestive heart failure with LV diastolic dysfunction, NYHA class 2 (HCC)    Family history of adverse reaction to anesthesia    father- change in personaility   GERD (gastroesophageal reflux disease)    Hypertension    Hypertension associated with diabetes ([place])    Hypertensive chronic  kidney disease    Insulin dependent type 2 diabetes mellitus (HCC)    Neuropathy    OSA on CPAP    Sleep apnea    uses cpap, pt does not know settings    Assessment: 81yo male admitted for acute CHF and new Afib w/ hypoxia to begin heparin.  Heparin level 0.45  Goal of Therapy:  Heparin level 0.3-0.7 units/ml Monitor platelets by anticoagulation protocol: Yes   Plan:  Continue IV heparin gtt at 2000 units/h Daily heparin level, CBC Monitor for signs and symptoms of bleeding Follow-up plans for long-term oral anticoagulant  Evelyne Makepeace BS, PharmD, BCPS Clinical Pharmacist 08/27/2021 7:04 AM
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
## 2021-08-27 NOTE — Progress Notes (Incomplete)
Heart Failure Stewardship Pharmacist Progress Note   PCP: Lavone Orn, MD PCP-Cardiologist: Sanda Klein, MD    HPI:  ***  Current HF Medications: {HF MEDS ZOXWRUE:45409}  Prior to admission HF Medications: {HF MEDS PTA:26664}  Pertinent Lab Values: Serum creatinine ***, BUN ***, Potassium ***, Sodium ***, BNP ***, Magnesium ***, A1c ***, Digoxin ***   Vital Signs: Weight: *** lbs (admission weight: *** lbs) Blood pressure: ***  Heart rate: ***  I/O: -***L yesterday; net -***L ReDS reading: ***  Medication Assistance / Insurance Benefits Check: Does the patient have prescription insurance?  {Yes/No/Pending:24180} Type of insurance plan: ***  Does the patient qualify for medication assistance through manufacturers or grants?   {Yes/No/Pending:24180} Eligible grants and/or patient assistance programs: *** Medication assistance applications in progress: ***  Medication assistance applications approved: *** Approved medication assistance renewals will be completed by: ***  Outpatient Pharmacy:  Prior to admission outpatient pharmacy: *** Is the patient willing to use [facility] at discharge? {Yes/No/Pending:24180} Is the patient willing to transition their outpatient pharmacy to utilize a [facility] outpatient pharmacy?   {Yes/No/Pending:24180}    Assessment: 1. ***Acute on chronic systolic CHF (EF ***), due to ***. NYHA class *** symptoms. -    Plan: 1) Medication changes recommended at this time: -  2) Patient assistance: -  3)  Education  - To be completed prior to discharge  Kerby Nora, PharmD, BCPS Heart Failure Stewardship Pharmacist Phone [phone]

## 2021-08-27 NOTE — Progress Notes (Signed)
Heart Failure Nurse Navigator Progress Note  PCP: Lavone Orn, MD PCP-Cardiologist: Bertrum Sol., MD Admission Diagnosis: a/c CHF Admitted from: home with spouse  Presentation:   Robert Dickerson presented 1/24 with increased SOB. Patient sitting on EOB with 2LPM via [place]. Patient interactive with interview process. Spouse at bedside, pt just completed bathing. Marked improvement from yesterday, able to speak in full sentences, better coloration. Pt states he feels he is moving in the right direction, states he did not get SOB while bathing. Pt drives reliable vehicle. Pt quit smoking in 1984, no alcohol, no illicit drug use. PT states he eats bacon or sausage most mornings. 2-6 cups of coffee and a lot of water during the day. States he felt his home lasix was working well as he was voiding often so he drank more water. Educated on sodium and fluid intake modifications.  Explained benefits of Heart & Vascular Transitions of Care Clinic appointment, patient agreeable. Monitoring renal function.    ECHO/ LVEF: 30-35%  Clinical Course:  Past Medical History:  Diagnosis Date   Arthritis    all joints   Chronic diastolic CHF (congestive heart failure) (HCC)    Chronic kidney disease (CKD), stage IV (severe) (HCC)    Chronic kidney disease, stage 3 (HCC)    Congestive heart failure with LV diastolic dysfunction, NYHA class 2 (HCC)    Family history of adverse reaction to anesthesia    father- change in personaility   GERD (gastroesophageal reflux disease)    Hypertension    Hypertension associated with diabetes ([place])    Hypertensive chronic kidney disease    Insulin dependent type 2 diabetes mellitus (HCC)    Neuropathy    OSA on CPAP    Sleep apnea    uses cpap, pt does not know settings     Social History   Socioeconomic History   Marital status: Married    Spouse name: Judeen Hammans   Number of children: Not on file   Years of education: Not on file   Highest education level: Not on  file  Occupational History   Occupation: retired  Tobacco Use   Smoking status: Former    Packs/day: 3.00    Years: 20.00    Pack years: 60.00    Types: Cigarettes    Quit date: 1984    Years since quitting: 39.0   Smokeless tobacco: Never  Vaping Use   Vaping Use: Never used  Substance and Sexual Activity   Alcohol use: Not Currently   Drug use: Never   Sexual activity: Not on file  Other Topics Concern   Not on file  Social History Narrative   ** Merged History Encounter **       Social Determinants of Health   Financial Resource Strain: Low Risk    Difficulty of Paying Living Expenses: Not hard at all  Food Insecurity: No Food Insecurity   Worried About Charity fundraiser in the Last Year: Never true   Ran Out of Food in the Last Year: Never true  Transportation Needs: No Transportation Needs   Lack of Transportation (Medical): No   Lack of Transportation (Non-Medical): No  Physical Activity: Not on file  Stress: Not on file  Social Connections: Not on file    High Risk Criteria for Readmission and/or Poor Patient Outcomes: Heart failure hospital admissions (last 6 months): 1  No Show rate: 1% Difficult social situation: no Demonstrates medication adherence: yes Primary Language: English Literacy level: able to  read/write and comprehend. Wears glasses.   Education Assessment and Provision:  Detailed education and instructions provided on heart failure disease management including the following:  Signs and symptoms of Heart Failure When to call the physician Importance of daily weights Low sodium diet Fluid restriction Medication management Anticipated future follow-up appointments  Patient education given on each of the above topics.  Patient acknowledges understanding via teach back method and acceptance of all instructions.  Education Materials:  "Living Better With Heart Failure" Booklet, HF zone tool, & Daily Weight Tracker Tool.  Patient has  scale at home: yes Patient has pill box at home: yes   Barriers of Care:   -dietary indiscretion  Considerations/Referrals:   Referral made to Heart Failure Pharmacist Stewardship: yes, appreciated Referral made to Heart Failure CSW/NCM TOC: no Referral made to Heart & Vascular TOC clinic: yes, pending renal improvement and DC date  Items for Follow-up on DC/TOC: -optimize -cont HF education-sodium/fluid modification   Pricilla Holm, MSN, RN Heart Failure Nurse Navigator [phone]

## 2021-08-27 NOTE — Progress Notes (Addendum)
Progress Note  Patient Name: Robert Dickerson Date of Encounter: 08/27/2021  [facility] Cardiologist: Sanda Klein, MD   Subjective   Denies chest pain. SOB has improved significantly, able to walk to bathroom without SOB, comfortable at rest. Continues to wear oxygen via Nasal Cannula (3L/min). Reports using the bathroom often with good urine output. Denies palpitations, dizziness/lightheadedness.   Inpatient Medications    Scheduled Meds:  allopurinol  150 mg Oral QHS   furosemide  80 mg Intravenous Q6H   hydrALAZINE  10 mg Oral Q8H   insulin aspart  0-15 Units Subcutaneous TID WC   insulin aspart  0-5 Units Subcutaneous QHS   insulin glargine-yfgn  10 Units Subcutaneous Daily   isosorbide dinitrate  10 mg Oral TID   mouth rinse  15 mL Mouth Rinse BID   metoprolol tartrate  25 mg Oral BID   predniSONE  5 mg Oral Q breakfast   pregabalin  75 mg Oral BID   Continuous Infusions:  heparin 2,000 Units/hr (08/27/21 0733)   PRN Meds: acetaminophen **OR** acetaminophen, albuterol, hydrOXYzine, ondansetron **OR** ondansetron (ZOFRAN) IV, oxyCODONE   Vital Signs    Vitals:   08/26/21 1951 08/26/21 2036 08/27/21 0010 08/27/21 0632  BP: 122/61  108/60 105/66  Pulse: (!) 110     Resp: 18  20 20   Temp: 98.6 F (37 C)   98.3 F (36.8 C)  TempSrc: Oral   Oral  SpO2: 92% 94% 94% 95%  Weight:    102.7 kg  Height:        Intake/Output Summary (Last 24 hours) at 08/27/2021 0750 Last data filed at 08/27/2021 0733 Gross per 24 hour  Intake 387.83 ml  Output 1350 ml  Net -962.17 ml   Last 3 Weights 08/27/2021 08/26/2021 08/25/2021  Weight (lbs) 226 lb 8 oz 228 lb 6.3 oz 225 lb 9.6 oz  Weight (kg) 102.74 kg 103.6 kg 102.331 kg      Telemetry    Sinus rhythm with frequent PACs, no true afib, rate 70s-80s - Personally Reviewed  ECG    No new tracings - Personally Reviewed  Physical Exam   GEN: No acute distress. Wearing nasal cannula, does not appear uncomfortable    Neck: No JVD Cardiac: RRR, no murmurs, rubs, or gallops. Radial pulses 2+ bilaterally  Respiratory: Clear to auscultation bilaterally. GI: Soft, nontender, non-distended  MS: No edema; No deformity. Skin: superficial wound in right shin area  Neuro:  Nonfocal  Psych: Normal affect   Labs    High Sensitivity Troponin:   Recent Labs  Lab 08/25/21 0205 08/25/21 1233 08/25/21 1501  TROPONINIHS 328* 409* 408*     Chemistry Recent Labs  Lab 08/25/21 0305 08/26/21 0615 08/27/21 0426  NA 134* 132* 139  K 3.8 3.6 3.5  CL 97* 96* 97*  CO2 27 26 28   GLUCOSE 213* 156* 138*  BUN 54* 63* 78*  CREATININE 2.43* 2.76* 2.97*  CALCIUM 7.8* 8.2* 8.5*  MG 2.2  --   --   PROT 5.6*  --   --   ALBUMIN 2.5*  --  2.4*  AST 21  --   --   ALT 16  --   --   ALKPHOS 112  --   --   BILITOT 1.4*  --   --   GFRNONAA 26* 23* 21*  ANIONGAP 10 10 14     Lipids  Recent Labs  Lab 08/26/21 0615  CHOL 149  TRIG 76  HDL 64  LDLCALC 70  CHOLHDL 2.3    Hematology Recent Labs  Lab 08/25/21 0305 08/26/21 0615 08/27/21 0426  WBC 16.6* 14.4* 10.2  RBC 4.03* 4.12* 3.98*  HGB 11.5* 11.7* 11.2*  HCT 34.6* 36.2* 34.4*  MCV 85.9 87.9 86.4  MCH 28.5 28.4 28.1  MCHC 33.2 32.3 32.6  RDW 16.0* 16.1* 16.3*  PLT 324 300 329   Thyroid  Recent Labs  Lab 08/25/21 0905  TSH 4.104    BNP Recent Labs  Lab 08/24/21 2350 08/26/21 0615  BNP 378.5* 577.1*    DDimer No results for input(s): DDIMER in the last 168 hours.   Radiology    ECHOCARDIOGRAM COMPLETE  Result Date: 08/25/2021    ECHOCARDIOGRAM REPORT   Patient Name:   BOYSIE BONEBRAKE Date of Exam: 08/25/2021 Medical Rec #:  [id number]     Height:       68.3 in Accession #:    [id number]    Weight:       225.6 lb Date of Birth:  14-Nov-1940    BSA:          2.157 m Patient Age:    81 years      BP:           110/64 mmHg Patient Gender: M             HR:           81 bpm. Exam Location:  Inpatient Procedure: 2D Echo Indications:    Atrial  fibrillation  History:        Patient has prior history of Echocardiogram examinations, most                 recent 09/04/2013. Risk Factors:Hypertension and Diabetes.  Sonographer:    Jefferey Pica Referring Phys: [place]  1. Left ventricular ejection fraction, by estimation, is 30 to 35%. The left ventricle has moderately decreased function. The left ventricle demonstrates global hypokinesis. The left ventricular internal cavity size was mildly dilated. There is mild concentric left ventricular hypertrophy. Left ventricular diastolic parameters are indeterminate.  2. Right ventricular systolic function is normal. The right ventricular size is normal.  3. The mitral valve is grossly normal. No evidence of mitral valve regurgitation.  4. The aortic valve was not well visualized. Aortic valve regurgitation is not visualized. No aortic stenosis is present.  5. The inferior vena cava is dilated in size with <50% respiratory variability, suggesting right atrial pressure of 15 mmHg. FINDINGS  Left Ventricle: Left ventricular ejection fraction, by estimation, is 30 to 35%. The left ventricle has moderately decreased function. The left ventricle demonstrates global hypokinesis. The left ventricular internal cavity size was mildly dilated. There is mild concentric left ventricular hypertrophy. Left ventricular diastolic parameters are indeterminate. Right Ventricle: The right ventricular size is normal. No increase in right ventricular wall thickness. Right ventricular systolic function is normal. Left Atrium: Left atrial size was normal in size. Right Atrium: Right atrial size was normal in size. Pericardium: Trivial pericardial effusion is present. Mitral Valve: The mitral valve is grossly normal. No evidence of mitral valve regurgitation. Tricuspid Valve: The tricuspid valve is normal in structure. Tricuspid valve regurgitation is not demonstrated. No evidence of tricuspid stenosis. Aortic Valve: The  aortic valve was not well visualized. Aortic valve regurgitation is not visualized. No aortic stenosis is present. Aortic valve peak gradient measures 3.2 mmHg. Pulmonic Valve: The pulmonic valve was normal in structure. Pulmonic valve regurgitation is not visualized. No  evidence of pulmonic stenosis. Aorta: The aortic root and ascending aorta are structurally normal, with no evidence of dilitation. Venous: The inferior vena cava is dilated in size with less than 50% respiratory variability, suggesting right atrial pressure of 15 mmHg. IAS/Shunts: No atrial level shunt detected by color flow Doppler.  LEFT VENTRICLE PLAX 2D LVIDd:         6.00 cm   Diastology LVIDs:         5.10 cm   LV e' lateral:   5.25 cm/s LV PW:         1.10 cm   LV E/e' lateral: 21.9 LV IVS:        1.10 cm LVOT diam:     2.10 cm LV SV:         56 LV SV Index:   26 LVOT Area:     3.46 cm  RIGHT VENTRICLE          IVC RV Basal diam:  3.40 cm  IVC diam: 2.40 cm TAPSE (M-mode): 1.8 cm LEFT ATRIUM             Index        RIGHT ATRIUM           Index LA diam:        4.00 cm 1.85 cm/m   RA Area:     13.90 cm LA Vol (A2C):   80.7 ml 37.41 ml/m  RA Volume:   36.00 ml  16.69 ml/m LA Vol (A4C):   61.3 ml 28.41 ml/m LA Biplane Vol: 70.5 ml 32.68 ml/m  AORTIC VALVE                 PULMONIC VALVE AV Area (Vmax): 3.31 cm     PV Vmax:       0.57 m/s AV Vmax:        88.80 cm/s   PV Peak grad:  1.3 mmHg AV Peak Grad:   3.2 mmHg LVOT Vmax:      84.90 cm/s LVOT Vmean:     45.900 cm/s LVOT VTI:       0.163 m  AORTA Ao Root diam: 3.70 cm Ao Asc diam:  3.30 cm MITRAL VALVE MV Area (PHT): 3.79 cm     SHUNTS MV Decel Time: 200 msec     Systemic VTI:  0.16 m MV E velocity: 115.00 cm/s  Systemic Diam: 2.10 cm MV A velocity: 107.00 cm/s MV E/A ratio:  1.07 Rudean Haskell MD Electronically signed by Rudean Haskell MD Signature Date/Time: 08/25/2021/3:00:07 PM    Final     Cardiac Studies   ECHO 08/25/2021 1. Left ventricular ejection fraction, by  estimation, is 30 to 35%. The left ventricle has moderately decreased function. The left ventricle demonstrates global hypokinesis. The left ventricular internal cavity size was mildly dilated. There is mild concentric left ventricular hypertrophy. Left ventricular diastolic parameters are indeterminate.   2. Right ventricular systolic function is normal. The right ventricular size is normal.   3. The mitral valve is grossly normal. No evidence of mitral valve regurgitation.   4. The aortic valve was not well visualized. Aortic valve regurgitation is not visualized. No aortic stenosis is present.   5. The inferior vena cava is dilated in size with <50% respiratory variability, suggesting right atrial pressure of 15 mmHg.  Patient Profile     81 y.o. male with previous HFpEF (EF 50-55%), CKD 4, DM2 on insulin, HTN, OSA presents with acute pulmonary edema and  new reduction in LVEF (30-35%, global pattern) and suspicion for paroxysmal atrial fibrillation (probably artifactual due to frequent PACs and poor quality tracing)  Assessment & Plan    HFrEF  - Echo on 08/25/21 showed EF 30-35%, global hypokinesis (down from 50-55% in 2015)  - BNP 378.5 on presentation, increased to 577.1 on 1/25 - Has been on lasix IV 80 mg q6hrs. Appears to be having good diuresis (currently net -1.25 L, I/Os not updated today). Continues to have some abdominal distension on exam, no significant bump in creatinine (2.97 up from 2.76). Continue to diurese while monitoring kidney function  - On BB, blood pressure tolerating well. Unable to add ACEi/ARB due to renal function - Hydralazine TID added yesterday for BP, given 2 doses yesterday. Held yesterday evening and this morning as BP was below hold parameters.   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 Patient has CDK stage 4 and would be at high risk for nephrotoxicity if we were to proceed with LHC. Will manage medically and consider nuclear stress test, but test may be of limited use if patient has multivessel CAD  - On IV heparin, but not thought to have ACS or Afib. Can likely switch to VTE prophylaxis dosing instead, but will discuss with MD.  - On beta blocker, ASA - Initiate statin therapy (LDL 70, triglycerides 76, HDL 64 on 1/25)   AKI on CDK4 - Creatinine 2.97, not significantly increased while on diuretics - Continue to monitor. Per discussion with patient, he strongly hopes to avoid dialysis. Will avoid procedures/medications that may worsen kidney function ' - Nephrology following   Frequent PACs  - Did not see evidence of true atrial fibrillation  - No indication for long term anticoagulation at this time    For questions or updates, please contact [place] Please consult [email or web address] for contact info under        Signed, Margie Billet, PA-C  08/27/2021, 7:50 AM    I have seen and examined the patient along with Margie Billet, PA-C .  I have reviewed the chart, notes and new data.  I agree with PA/NP's note.  Key new complaints: breathing much better but still needs supplemental O2 Key examination changes: edema improved, clear lung fields Key new findings / data: slight worsening of BUN/creatinine  PLAN: Decrease hydralazine nitrates closer to "half-Bidil" equivalent, as BP is not tolerating current dose. Continue diuresis, but suspect will have to retreat in next 24 hours, based on renal parameters. Ideally, would get him off O2 at a minimum. Discussed concept of "dry weight" (which we need to re-establish), dietary sodium restriction, daily weight monitoring, signs and symptoms of HF exacerbation. Will probably end up needing a weight  based diuretic sliding-scale.  Sanda Klein, MD, [place] [phone] 08/27/2021, 2:24 PM

## 2021-08-28 ENCOUNTER — Other Ambulatory Visit (HOSPITAL_COMMUNITY): Payer: Self-pay

## 2021-08-28 ENCOUNTER — Encounter: Payer: Self-pay | Admitting: Cardiovascular Disease

## 2021-08-28 DIAGNOSIS — N179 Acute kidney failure, unspecified: Secondary | ICD-10-CM | POA: Diagnosis not present

## 2021-08-28 DIAGNOSIS — N184 Chronic kidney disease, stage 4 (severe): Secondary | ICD-10-CM | POA: Diagnosis not present

## 2021-08-28 DIAGNOSIS — J9601 Acute respiratory failure with hypoxia: Secondary | ICD-10-CM | POA: Diagnosis not present

## 2021-08-28 DIAGNOSIS — I5033 Acute on chronic diastolic (congestive) heart failure: Secondary | ICD-10-CM | POA: Diagnosis not present

## 2021-08-28 LAB — BASIC METABOLIC PANEL
Anion gap: 11 (ref 5–15)
BUN: 90 mg/dL — ABNORMAL HIGH (ref 8–23)
CO2: 28 mmol/L (ref 22–32)
Calcium: 8.4 mg/dL — ABNORMAL LOW (ref 8.9–10.3)
Chloride: 99 mmol/L (ref 98–111)
Creatinine, Ser: 3.22 mg/dL — ABNORMAL HIGH (ref 0.61–1.24)
GFR, Estimated: 19 mL/min — ABNORMAL LOW (ref >60)
Glucose, Bld: 143 mg/dL — ABNORMAL HIGH (ref 70–99)
Potassium: 3.8 mmol/L (ref 3.5–5.1)
Sodium: 138 mmol/L (ref 135–145)

## 2021-08-28 LAB — GLUCOSE, CAPILLARY
Glucose-Capillary: 137 mg/dL — ABNORMAL HIGH (ref 70–99)
Glucose-Capillary: 130 mg/dL — ABNORMAL HIGH (ref 70–99)

## 2021-08-28 LAB — CBC
HCT: 33.9 % — ABNORMAL LOW (ref 39.0–52.0)
Hemoglobin: 11.2 g/dL — ABNORMAL LOW (ref 13.0–17.0)
MCH: 28.2 pg (ref 26.0–34.0)
MCHC: 33 g/dL (ref 30.0–36.0)
MCV: 85.4 fL (ref 80.0–100.0)
Platelets: 313 10*3/uL (ref 150–400)
RBC: 3.97 MIL/uL — ABNORMAL LOW (ref 4.22–5.81)
RDW: 15.9 % — ABNORMAL HIGH (ref 11.5–15.5)
WBC: 7.6 10*3/uL (ref 4.0–10.5)
nRBC: 0 % (ref 0.0–0.2)

## 2021-08-28 MED ORDER — HYDRALAZINE HCL 10 MG PO TABS
10.0000 mg | ORAL_TABLET | Freq: Three times a day (TID) | ORAL | 0 refills | Status: DC
  Filled 2021-08-28: qty 90, 30d supply, fill #0

## 2021-08-28 MED ORDER — FUROSEMIDE 80 MG PO TABS
80.0000 mg | ORAL_TABLET | Freq: Two times a day (BID) | ORAL | 0 refills | Status: DC
  Filled 2021-08-28: qty 60, 30d supply, fill #0

## 2021-08-28 MED ORDER — ATORVASTATIN CALCIUM 20 MG PO TABS
20.0000 mg | ORAL_TABLET | Freq: Every day | ORAL | 0 refills | Status: DC
  Filled 2021-08-28: qty 30, 30d supply, fill #0

## 2021-08-28 MED ORDER — ISOSORBIDE DINITRATE 10 MG PO TABS
10.0000 mg | ORAL_TABLET | Freq: Three times a day (TID) | ORAL | 0 refills | Status: DC
  Filled 2021-08-28: qty 90, 30d supply, fill #0

## 2021-08-28 MED ORDER — METOPROLOL TARTRATE 25 MG PO TABS
25.0000 mg | ORAL_TABLET | Freq: Two times a day (BID) | ORAL | 0 refills | Status: DC
  Filled 2021-08-28: qty 60, 30d supply, fill #0

## 2021-08-28 NOTE — TOC Initial Note (Addendum)
Transition of Care [facility]) - Initial/Assessment Note    Patient Details  Name: Robert Dickerson MRN: [id number] Date of Birth: Oct 06, 1940  [facility] CM/SW Contact:    Zenon Mayo, RN Phone Number: 08/28/2021, 11:45 AM  Clinical Narrative:                 Patient lives with wife at home, he has a cane that he uses when he is out. He also has a rolling walker.  He does not need home oxygen per ambulatory sats, wife is at bedside, she will transport him home today.  Wife states they had [facility] for [facility] services and would like to continue the [facility] to check on him will set this up.  MD to put in [facility] order, and NCM notified Cyndi with [facility] ,she states they will be able to have a HHRN to come out to see him for disease management.  Expected Discharge Plan: [place] Barriers to Discharge: No Barriers Identified   Patient Goals and CMS Choice Patient states their goals for this hospitalization and ongoing recovery are:: return home CMS Medicare.gov Compare Post Acute Care list provided to:: Patient Choice offered to / list presented to : Patient  Expected Discharge Plan and Services Expected Discharge Plan: [place] In-house Referral: NA Discharge Planning Services: CM Consult Post Acute Care Choice: [place] arrangements for the past 2 months: Single Family Home                   DME Agency: NA       HH Arranged: RN [place] Agency: [place] Date [facility] Agency Contacted: 08/28/21 Time HH Agency Contacted: 55 Representative spoke with at [place]: [place] Arrangements/Services Living arrangements for the past 2 months: [place] with:: Spouse Patient language and need for interpreter reviewed:: Yes Do you feel safe going back to the place where you live?: Yes      Need for Family Participation in Patient Care: Yes (Comment) Care giver support system in place?: Yes  (comment) Current home services: DME (has several canes) Criminal Activity/Legal Involvement Pertinent to Current Situation/Hospitalization: No - Comment as needed  Activities of Daily Living      Permission Sought/Granted                  Emotional Assessment Appearance:: Appears stated age Attitude/Demeanor/Rapport: Engaged Affect (typically observed): Appropriate Orientation: : Oriented to Self, Oriented to Place, Oriented to  Time, Oriented to Situation Alcohol / Substance Use: Not Applicable Psych Involvement: No (comment)  Admission diagnosis:  Acute congestive heart failure (HCC) [I50.9] Paroxysmal atrial fibrillation (HCC) [I48.0] Acute congestive heart failure, unspecified heart failure type ([place]) [I50.9] Patient Active Problem List   Diagnosis Date Noted   Unspecified atrial fibrillation ([place]) 08/25/2021   Acute on chronic diastolic CHF (congestive heart failure) ([place]) 08/25/2021   Wound of right leg, sequela 07/01/2021   Nail, injury by 06/10/2021   Laceration of leg 05/20/2021   Acute respiratory failure with hypoxia ([place]) 05/18/2021   Chronic diastolic CHF (congestive heart failure) ([place]) 05/18/2021   Chronic kidney disease (CKD), stage IV (severe) ([place]) 05/18/2021   Hypertension associated with diabetes ([place]) 05/18/2021   Insulin dependent type 2 diabetes mellitus ([place]) 05/18/2021   OSA on CPAP 05/18/2021   Laceration of right lower extremity 05/18/2021   Current chronic use of systemic steroids 01/08/2021   Preoperative cardiovascular examination  01/08/2021   Cellulitis 12/23/2020   Benign neoplasm of duodenum, jejunum, 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   Chronic kidney disease, stage 4 (severe) ([place]) 12/03/2020   Degeneration of lumbar intervertebral disc 12/03/2020   Type 2 diabetes mellitus with stage 4 chronic kidney disease ([place]) 12/03/2020   Diabetic peripheral neuropathy associated with type 2 diabetes mellitus ([place])  06/00/4599   Diastolic heart failure ([place]) 12/03/2020   Enlarged prostate 12/03/2020   Erectile dysfunction due to arterial insufficiency 12/03/2020   Essential hypertension 12/03/2020   Gastro-esophageal reflux disease without esophagitis 12/03/2020   Hematuria 12/03/2020   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   Morbid obesity ([place]) 12/03/2020   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 [place] [address] Phone: [phone] Fax: [phone]  [facility] [address] [place] [address] Phone: [phone] Fax: [phone]  [facility] ([place], [place] [facility] [address] [place] [address] Phone: [phone] Fax: [phone]  [facility] [place], [place] [address] Phone: [phone] Fax: [phone]     Social Determinants of Health (SDOH) Interventions Food Insecurity Interventions: Intervention Not Indicated Financial Strain Interventions: Intervention Not Indicated Housing Interventions: Intervention Not Indicated Transportation Interventions: Intervention Not Indicated  Readmission Risk Interventions Readmission Risk Prevention Plan 08/28/2021   Transportation Screening Complete  PCP or Specialist Appt within 3-5 Days Complete  HRI or Home Care Consult Complete  Social Work Consult for [place] Planning/Counseling Complete  Palliative Care Screening Not Applicable  Medication Review Press photographer) Complete  Some recent data might be hidden
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
## 2021-08-28 NOTE — Progress Notes (Signed)
Progress Note  Patient Name: Robert Dickerson Date of Encounter: 08/28/2021  [facility] Cardiologist: Sanda Klein, MD   Subjective   Markedly improved from a respiratory standpoint.  Able to walk in the room without oxygen maintaining oxygen saturation 96%. No longer has peripheral edema. Blood pressure in acceptable range.  Denies dizziness or lightheadedness. Creatinine has worsened to 3.22 BUN 19.  Potassium normal.  Inpatient Medications    Scheduled Meds:  allopurinol  150 mg Oral QHS   aspirin EC  81 mg Oral Daily   atorvastatin  20 mg Oral Daily   furosemide  80 mg Intravenous Q6H   heparin  5,000 Units Subcutaneous Q8H   hydrALAZINE  10 mg Oral Q8H   insulin aspart  0-15 Units Subcutaneous TID WC   insulin aspart  0-5 Units Subcutaneous QHS   insulin glargine-yfgn  10 Units Subcutaneous Daily   isosorbide dinitrate  10 mg Oral TID   mouth rinse  15 mL Mouth Rinse BID   metoprolol tartrate  25 mg Oral BID   predniSONE  5 mg Oral Q breakfast   pregabalin  75 mg Oral BID   Continuous Infusions:  PRN Meds: acetaminophen **OR** acetaminophen, albuterol, hydrOXYzine, ondansetron **OR** ondansetron (ZOFRAN) IV, oxyCODONE   Vital Signs    Vitals:   08/28/21 0403 08/28/21 0730 08/28/21 0822 08/28/21 0920  BP: 121/75 116/61 117/61   Pulse: 63 83 73   Resp: (!) 22 18 18    Temp: (!) 97.5 F (36.4 C) 98 F (36.7 C)    TempSrc: Oral Oral    SpO2: 94% 95% 96% 96%  Weight: 100.6 kg     Height:        Intake/Output Summary (Last 24 hours) at 08/28/2021 0952 Last data filed at 08/28/2021 7915 Gross per 24 hour  Intake 600 ml  Output 2200 ml  Net -1600 ml   Last 3 Weights 08/28/2021 08/27/2021 08/26/2021  Weight (lbs) 221 lb 11.2 oz 226 lb 8 oz 228 lb 6.3 oz  Weight (kg) 100.562 kg 102.74 kg 103.6 kg      Telemetry    Sinus rhythm with very frequent PACs, no true atrial fibrillation seen- Personally Reviewed  ECG    No new tracing- Personally  Reviewed  Physical Exam  Obese.  Looks very comfortable lying supine in bed and walking around the room. GEN: No acute distress.   Neck: No JVD Cardiac: RRR with frequent ectopy, no murmurs, rubs, or gallops.  Respiratory: Clear to auscultation bilaterally. GI: Soft, nontender, non-distended  MS: No edema; No deformity.  Superficial purulent drainage from right pretibial wound.  There is no surrounding erythema or tenderness. Neuro:  Nonfocal  Psych: Normal affect   Labs    High Sensitivity Troponin:   Recent Labs  Lab 08/25/21 0205 08/25/21 1233 08/25/21 1501  TROPONINIHS 328* 409* 408*     Chemistry Recent Labs  Lab 08/25/21 0305 08/26/21 0615 08/27/21 0426 08/28/21 0258  NA 134* 132* 139 138  K 3.8 3.6 3.5 3.8  CL 97* 96* 97* 99  CO2 27 26 28 28   GLUCOSE 213* 156* 138* 143*  BUN 54* 63* 78* 90*  CREATININE 2.43* 2.76* 2.97* 3.22*  CALCIUM 7.8* 8.2* 8.5* 8.4*  MG 2.2  --   --   --   PROT 5.6*  --   --   --   ALBUMIN 2.5*  --  2.4*  --   AST 21  --   --   --  ALT 16  --   --   --   ALKPHOS 112  --   --   --   BILITOT 1.4*  --   --   --   GFRNONAA 26* 23* 21* 19*  ANIONGAP 10 10 14 11     Lipids  Recent Labs  Lab 08/26/21 0615  CHOL 149  TRIG 76  HDL 64  LDLCALC 70  CHOLHDL 2.3    Hematology Recent Labs  Lab 08/26/21 0615 08/27/21 0426 08/28/21 0258  WBC 14.4* 10.2 7.6  RBC 4.12* 3.98* 3.97*  HGB 11.7* 11.2* 11.2*  HCT 36.2* 34.4* 33.9*  MCV 87.9 86.4 85.4  MCH 28.4 28.1 28.2  MCHC 32.3 32.6 33.0  RDW 16.1* 16.3* 15.9*  PLT 300 329 313   Thyroid  Recent Labs  Lab 08/25/21 0905  TSH 4.104    BNP Recent Labs  Lab 08/24/21 2350 08/26/21 0615  BNP 378.5* 577.1*    DDimer No results for input(s): DDIMER in the last 168 hours.   Radiology    No results found.  Cardiac Studies   ECHO 08/25/2021 1. Left ventricular ejection fraction, by estimation, is 30 to 35%. The left ventricle has moderately decreased function. The left  ventricle demonstrates global hypokinesis. The left ventricular internal cavity size was mildly dilated. There is mild concentric left ventricular hypertrophy. Left ventricular diastolic parameters are indeterminate.  2. Right ventricular systolic function is normal. The right ventricular size is normal.   3. The mitral valve is grossly normal. No evidence of mitral valve regurgitation.   4. The aortic valve was not well visualized. Aortic valve regurgitation is not visualized. No aortic stenosis is present.   5. The inferior vena cava is dilated in size with <50% respiratory variability, suggesting right atrial pressure of 15 mmHg.  Patient Profile     81 y.o. 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  Renal insufficiency also precludes use of RAAS inhibitors.  Currently on very low-dose hydralazine/long-acting nitrates.  Current weight of 222 pounds seems to be euvolemia.  Would encourage him to try to keep his weight in the 218-223 pound range.  Will probably need a weight-based sliding scale dosing of his diuretic.  We will wait to see Dr. Shelva Majestic opinion about optimum diuretic prescription, but I would suggest starting at furosemide 80 mg once daily.  We will have to keep in close touch with Dr. Joelyn Oms while adjusting his diuretics.  Had a lengthy discussion regarding dietary sodium restriction, daily weight monitoring, signs and symptoms of heart failure exacerbation.  Consider adding SGLT2 inhibitor, but would like to see the renal function stabilized first. Aortic  atherosclerosis/coronary artery calcifications on CT: Increases level of suspicion for extensive multivessel CAD as a cause for his worsening cardiomyopathy.  Had chest pressure on presentation, improved with diuresis.  Had very mild and adynamic high-sensitivity troponin pattern.  Consistent with demand myocardial injury in the setting of heart failure exacerbation with underlying stable CAD.  We will treat medically to avoid risk of renal failure progression.  Target LDL cholesterol less than 70's LDL was right at  70 during this admission).  No evidence of acute coronary syndrome. CKD4: He is relatively firmly against chronic hemodialysis.  Diuretics have been stopped due to worsening renal function just before his admission when he presented with pulmonary edema.  Unfortunately he will need chronic diuretic therapy and will probably have to settle for a higher baseline level of creatinine.  Defer final recommendation for diuretic dose to Dr. Moshe Cipro. PACs: Very frequent, but careful review of telemetry strips and all ECGs does not show evidence of true atrial fibrillation.  No recommendation for chronic anticoagulation at this time.  [facility] will sign off.   Medication Recommendations:   Hydralazine 10 mg 3 times daily Isosorbide dinitrate 10 mg 3 times daily Furosemide 80 mg once daily Aspirin 81 mg daily Atorvastatin 20 mg once daily Toprol tartrate 25 mg twice daily Other recommendations (labs, testing, etc): BMET at office follow up Follow up as an outpatient:  We will schedule him for follow-up with me in clinic on Thursday, February 2.  For questions or updates, please contact [place] Please consult [email or web address] for contact info under        Signed, Sanda Klein, MD  08/28/2021, 9:52 AM

## 2021-08-28 NOTE — Progress Notes (Signed)
Subjective:  2825 UOP recorded-  negative 1900.  HR controlled-  BP soft -  BUN and crt up again- he wants to go home because he feels so much better-  not uremic    Objective Vital signs in last 24 hours: Vitals:   08/28/21 0403 08/28/21 0730 08/28/21 0822 08/28/21 0920  BP: 121/75 116/61 117/61   Pulse: 63 83 73   Resp: (!) 22 18 18    Temp: (!) 97.5 F (36.4 C) 98 F (36.7 C)    TempSrc: Oral Oral    SpO2: 94% 95% 96% 96%  Weight: 100.6 kg     Height:       Weight change: -3.037 kg  Intake/Output Summary (Last 24 hours) at 08/28/2021 1015 Last data filed at 08/28/2021 3244 Gross per 24 hour  Intake 600 ml  Output 2200 ml  Net -1600 ml    Assessment/Plan: 81 year old WM with many medical issues including stage 4 CKD.  He has had a disruption in his equilibrium-  not sure if due to primary cardiac cause or stopping his lasix but now has pulmonary edema that is symptomatic 1.Renal- baseline had CKD with crt as high as the mid 2's-  then 2.7 in the setting of decreased EF-  some type of atrial arrythmia.  He denies any LUTS-  I suspect the slightly higher crt is as a result of hemodynamic instability.  I have put some parameters on his hydralazine and lopressor to make sure that his BP does not get too low and give him ATN.   U/A s bland so c/w cardiorenal syndrome  There are no compelling needs for HD and could be difficult in this gentleman given his constellation of issues.  Crt up a little today -  not great but again no acute HD needs-  hope will eventually stabilize and improve back toward baseline.  Plans are being made for discharge today -  pt reports he has appt with Joelyn Oms next week.  I have discussed with primary to send out on lasix 80 bid -  he will check weights and write them down  2. Hypertension/volume  - he was overloaded-  better after lasix - now weight down to 221-  seems like a good hospital EDW for him  3. Hypokalemia-  gave a dose of k yesterday-  likely will need  chronically- would send out on 20 daily-  optimal will need to be determined as OP  4. Anemia  - is not  a major issue at this time    [place]: Basic Metabolic Panel: Recent Labs  Lab 08/26/21 0615 08/27/21 0426 08/28/21 0258  NA 132* 139 138  K 3.6 3.5 3.8  CL 96* 97* 99  CO2 26 28 28   GLUCOSE 156* 138* 143*  BUN 63* 78* 90*  CREATININE 2.76* 2.97* 3.22*  CALCIUM 8.2* 8.5* 8.4*  PHOS  --  5.1*  --    Liver Function Tests: Recent Labs  Lab 08/25/21 0305 08/27/21 0426  AST 21  --   ALT 16  --   ALKPHOS 112  --   BILITOT 1.4*  --   PROT 5.6*  --   ALBUMIN 2.5* 2.4*   No results for input(s): LIPASE, AMYLASE in the last 168 hours. No results for input(s): AMMONIA in the last 168 hours. CBC: Recent Labs  Lab 08/24/21 2350 08/25/21 0004 08/25/21 0305 08/26/21 0615 08/27/21 0426 08/28/21 0258  WBC 17.1*  --  16.6* 14.4* 10.2 7.6  NEUTROABS 14.5*  --  13.5*  --   --   --   HGB 12.6*   < > 11.5* 11.7* 11.2* 11.2*  HCT 38.1*   < > 34.6* 36.2* 34.4* 33.9*  MCV 86.2  --  85.9 87.9 86.4 85.4  PLT 342  --  324 300 329 313   < > = values in this interval not displayed.   Cardiac Enzymes: No results for input(s): CKTOTAL, CKMB, CKMBINDEX, TROPONINI in the last 168 hours. CBG: Recent Labs  Lab 08/27/21 0643 08/27/21 1142 08/27/21 1628 08/27/21 2126 08/28/21 0551  GLUCAP 133* 135* 122* 145* 137*    Iron Studies: No results for input(s): IRON, TIBC, TRANSFERRIN, FERRITIN in the last 72 hours. Studies/Results: No results found. Medications: Infusions:    Scheduled Medications:  allopurinol  150 mg Oral QHS   aspirin EC  81 mg Oral Daily   atorvastatin  20 mg Oral Daily   furosemide  80 mg Intravenous Q6H   heparin  5,000 Units Subcutaneous Q8H   hydrALAZINE  10 mg Oral Q8H   insulin aspart  0-15 Units Subcutaneous TID WC   insulin aspart  0-5 Units Subcutaneous QHS   insulin glargine-yfgn  10 Units Subcutaneous Daily    isosorbide dinitrate  10 mg Oral TID   mouth rinse  15 mL Mouth Rinse BID   metoprolol tartrate  25 mg Oral BID   predniSONE  5 mg Oral Q breakfast   pregabalin  75 mg Oral BID    have reviewed scheduled and prn medications.  Physical Exam: General: -  breathing better  Heart: rate controlled Lungs: improved air movement Abdomen: soft, non tender Extremities:  edema improved-  right lower leg ulcer-  looks like blister that popped     08/28/2021,10:15 AM  LOS: 3 days

## 2021-08-28 NOTE — Progress Notes (Signed)
Walked with pt in the hallway on RA. Pt stayed between 95-96% O2 while walking.

## 2021-08-28 NOTE — TOC Transition Note (Signed)
Transition of Care [facility]) - CM/SW Discharge Note   Patient Details  Name: Robert Dickerson MRN: [id number] Date of Birth: 01-Apr-1941  Transition of Care [facility]) CM/SW Contact:  Zenon Mayo, RN Phone Number: 08/28/2021, 11:50 AM   Clinical Narrative:    Patient lives with wife at home, he has a cane that he uses when he is out. He also has a rolling walker.  He does not need home oxygen per ambulatory sats, wife is at bedside, she will transport him home today.  Wife states they had [facility] for [facility] services and would like to continue the [facility] to check on him will set this up.  MD to put in [facility] order, and NCM notified Cyndi with [facility] ,she states they will be able to have a HHRN to come out to see him for disease management.     Final next level of care: [place] Barriers to Discharge: No Barriers Identified   Patient Goals and CMS Choice Patient states their goals for this hospitalization and ongoing recovery are:: return home CMS Medicare.gov Compare Post Acute Care list provided to:: Patient Choice offered to / list presented to : Patient  Discharge Placement                       Discharge Plan and Services In-house Referral: NA Discharge Planning Services: CM Consult Post Acute Care Choice: Home Health            DME Agency: NA       HH Arranged: RN, Disease Management [place] Agency: [place] Date [facility] Agency Contacted: 08/28/21 Time HH Agency Contacted: [id number] Representative spoke with at [place]: cydi  Social Determinants of Health (SDOH) Interventions Food Insecurity Interventions: Intervention Not Indicated Financial Strain Interventions: Intervention Not Indicated Housing Interventions: Intervention Not Indicated Transportation Interventions: Intervention Not Indicated   Readmission Risk Interventions Readmission Risk Prevention Plan 08/28/2021  Transportation Screening Complete  PCP or Specialist Appt within 3-5 Days  Complete  HRI or Home Care Consult Complete  Social Work Consult for [place] Planning/Counseling Complete  Palliative Care Screening Not Applicable  Medication Review Press photographer) Complete  Some recent data might be hidden

## 2021-08-28 NOTE — Progress Notes (Signed)
SATURATION QUALIFICATIONS: (This note is used to comply with regulatory documentation for home oxygen)  Patient Saturations on Room Air at Rest = 96%  Patient Saturations on Room Air while Ambulating = 95%   Please briefly explain why patient needs home oxygen: pt ambulated in the hallway. Pt denied any pain. Did not required supplemental oxygen.

## 2021-08-28 NOTE — Progress Notes (Signed)
Heart Failure Nurse Navigator Progress Note  Pt is being DC today, has close follow up with Dr. Sallyanne Kuster 2/2. Gave educational information, questions answered.   Pricilla Holm, MSN, RN Heart Failure Nurse Navigator [phone]

## 2021-08-28 NOTE — Progress Notes (Signed)
Nsg Discharge Note  Admit Date:  08/24/2021 Discharge date: 08/28/2021   TABER SWEETSER to be D/C'd Home w/ [facility] per MD order.  AVS completed. Patient/caregiver able to verbalize understanding.  Discharge Medication: Allergies as of 08/28/2021       Reactions   Oxycodone Hcl Other (See Comments)   Other reaction(s): "out of it" per pt   Ace Inhibitors Cough   Other reaction(s): cough   Lisinopril Cough   Oxycodone Other (See Comments)   "out of it"        Medication List     STOP taking these medications    amLODipine 10 MG tablet Commonly known as: NORVASC   HYDROcodone-acetaminophen 5-325 MG tablet Commonly known as: NORCO/VICODIN   potassium chloride SA 20 MEQ tablet Commonly known as: KLOR-CON M       TAKE these medications    Accu-Chek Guide test strip Generic drug: glucose blood daily before breakfast.   Accu-Chek Softclix Lancets lancets daily before breakfast.   allopurinol 300 MG tablet Commonly known as: ZYLOPRIM Take 300 mg by mouth at bedtime.   atorvastatin 20 MG tablet Commonly known as: LIPITOR Take 1 tablet (20 mg total) by mouth daily. Start taking on: August 29, 2021   blood glucose meter kit and supplies by Other route as directed. Check blood sugar every morning   blood glucose meter kit and supplies Kit Dispense based on patient and insurance preference. Use up to four times daily as directed. What changed:  when to take this additional instructions   clobetasol cream 0.05 % Commonly known as: TEMOVATE Apply 1 application topically 2 (two) times daily. What changed:  when to take this reasons to take this   furosemide 80 MG tablet Commonly known as: LASIX Take 1 tablet (80 mg total) by mouth 2 (two) times daily. What changed:  how much to take when to take this   hydrALAZINE 10 MG tablet Commonly known as: APRESOLINE Take 1 tablet (10 mg total) by mouth every 8 (eight) hours.   hydrOXYzine 100 MG capsule Commonly  known as: VISTARIL Take 1 capsule (100 mg total) by mouth 3 (three) times daily as needed for itching.   isosorbide dinitrate 10 MG tablet Commonly known as: ISORDIL Take 1 tablet (10 mg total) by mouth 3 (three) times daily.   Lantus SoloStar 100 UNIT/ML Solostar Pen Generic drug: insulin glargine Inject 10 Units into the skin in the morning.   metoprolol tartrate 25 MG tablet Commonly known as: LOPRESSOR Take 1 tablet (25 mg total) by mouth 2 (two) times daily.   NON FORMULARY CPAP  at bedtime   predniSONE 5 MG tablet Commonly known as: DELTASONE Take 5 mg by mouth daily with breakfast.   pregabalin 75 MG capsule Commonly known as: LYRICA Take 75 mg by mouth 2 (two) times daily.        Discharge Assessment: Vitals:   08/28/21 0920 08/28/21 1148  BP:  (!) 123/58  Pulse:  77  Resp:    Temp:    SpO2: 96%    Skin clean, dry and intact without evidence of skin break down, no evidence of skin tears noted. IV catheter discontinued intact. Site without signs and symptoms of complications - no redness or edema noted at insertion site, patient denies c/o pain - only slight tenderness at site.  Dressing with slight pressure applied.  D/c Instructions-Education: Discharge instructions given to patient/family with verbalized understanding. D/c education completed with patient/family including follow up instructions, medication  list, d/c activities limitations if indicated, with other d/c instructions as indicated by MD - patient able to verbalize understanding, all questions fully answered. Patient instructed to return to ED, call 911, or call MD for any changes in condition.  Patient escorted via [place], and D/C home via private auto.  Atilano Ina, RN 08/28/2021 1:20 PM

## 2021-08-28 NOTE — Care Management Important Message (Signed)
Important Message  Patient Details  Name: Robert Dickerson MRN: [id number] Date of Birth: 1940/09/17   Medicare Important Message Given:  Yes     Shelda Altes 08/28/2021, 10:08 AM

## 2021-08-28 NOTE — Progress Notes (Signed)
Heart Failure Stewardship Pharmacist Progress Note   PCP: Lavone Orn, MD PCP-Cardiologist: Sanda Klein, MD    HPI:  81 yo M with PMH of T2DM, CKD IV, HTN, CHF, and OSA. He presented to the ED with shortness of breath and edema. CXR with mild CHF. An ECHO was done on 1/24 and LVEF is 30-35%.   Current HF Medications: Diuretic: furosemide 80 mg IV q6h Beta Blocker: metoprolol tartrate 25 mg BID Other: hydralazine 10 mg q8h, isordil 10 mg TID  Prior to admission HF Medications: None  Pertinent Lab Values: Serum creatinine 2.97>3.22 (baseline ~2.5), BUN 90, Potassium 3.8, Sodium 138, BNP 577, Magnesium 2.2, A1c 8.2  Vital Signs: Weight: 221 lbs (admission weight: 225 lbs) Blood pressure: 110/70s  Heart rate: 70s  I/O: -2.9L yesterday; net -3.1L  Medication Assistance / Insurance Benefits Check: Does the patient have prescription insurance?  Yes Type of insurance plan: AARP Medicare  Does the patient qualify for medication assistance through manufacturers or grants?   Pending Eligible grants and/or patient assistance programs: pending Medication assistance applications in progress: none  Medication assistance applications approved: none Approved medication assistance renewals will be completed by: pending  Outpatient Pharmacy:  Prior to admission outpatient pharmacy: [facility] Is the patient willing to use [place] pharmacy at discharge? Yes Is the patient willing to transition their outpatient pharmacy to utilize a [facility] outpatient pharmacy?   Pending    Assessment: 1. Acute on chronic systolic CHF (EF 16-10%), due to unknown cause - ischemic eval limited due to renal function. NYHA class III symptoms. - May need to reduce lasix dose given further decline in renal function today - Continue metoprolol tartrate 25 mg BID - No ACE/ARB/ARNI, spironolactone, or SGLT2i with AKI - Continue hydralazine 10 mg TID + isordil 10 mg TID    Plan: 1) Medication changes  recommended at this time: - Consider reducing lasix dose with AKI  2) Patient assistance: - Entresto copay $47 per month - Farxiga/Jardiance $47 per month  3)  Education  - To be completed prior to discharge  Kerby Nora, PharmD, BCPS Heart Failure Cytogeneticist Phone [phone]

## 2021-08-29 ENCOUNTER — Encounter: Payer: Self-pay | Admitting: Cardiovascular Disease

## 2021-08-30 ENCOUNTER — Encounter: Payer: Self-pay | Admitting: Cardiovascular Disease

## 2021-08-30 DIAGNOSIS — Z794 Long term (current) use of insulin: Secondary | ICD-10-CM | POA: Diagnosis not present

## 2021-08-30 DIAGNOSIS — N184 Chronic kidney disease, stage 4 (severe): Secondary | ICD-10-CM | POA: Diagnosis not present

## 2021-08-30 DIAGNOSIS — Z7952 Long term (current) use of systemic steroids: Secondary | ICD-10-CM | POA: Diagnosis not present

## 2021-08-30 DIAGNOSIS — K219 Gastro-esophageal reflux disease without esophagitis: Secondary | ICD-10-CM | POA: Diagnosis not present

## 2021-08-30 DIAGNOSIS — Z945 Skin transplant status: Secondary | ICD-10-CM | POA: Diagnosis not present

## 2021-08-30 DIAGNOSIS — S81801D Unspecified open wound, right lower leg, subsequent encounter: Secondary | ICD-10-CM | POA: Diagnosis not present

## 2021-08-30 DIAGNOSIS — I5033 Acute on chronic diastolic (congestive) heart failure: Secondary | ICD-10-CM | POA: Diagnosis not present

## 2021-08-30 DIAGNOSIS — G4733 Obstructive sleep apnea (adult) (pediatric): Secondary | ICD-10-CM | POA: Diagnosis not present

## 2021-08-30 DIAGNOSIS — E1165 Type 2 diabetes mellitus with hyperglycemia: Secondary | ICD-10-CM | POA: Diagnosis not present

## 2021-08-30 DIAGNOSIS — Z9181 History of falling: Secondary | ICD-10-CM | POA: Diagnosis not present

## 2021-08-30 DIAGNOSIS — N179 Acute kidney failure, unspecified: Secondary | ICD-10-CM | POA: Diagnosis not present

## 2021-08-30 DIAGNOSIS — I130 Hypertensive heart and chronic kidney disease with heart failure and stage 1 through stage 4 chronic kidney disease, or unspecified chronic kidney disease: Secondary | ICD-10-CM | POA: Diagnosis not present

## 2021-08-30 DIAGNOSIS — E1122 Type 2 diabetes mellitus with diabetic chronic kidney disease: Secondary | ICD-10-CM | POA: Diagnosis not present

## 2021-08-30 DIAGNOSIS — Z6831 Body mass index (BMI) 31.0-31.9, adult: Secondary | ICD-10-CM | POA: Diagnosis not present

## 2021-08-30 DIAGNOSIS — Z9981 Dependence on supplemental oxygen: Secondary | ICD-10-CM | POA: Diagnosis not present

## 2021-08-30 DIAGNOSIS — M353 Polymyalgia rheumatica: Secondary | ICD-10-CM | POA: Diagnosis not present

## 2021-08-31 ENCOUNTER — Encounter: Payer: Self-pay | Admitting: Cardiovascular Disease

## 2021-09-02 DIAGNOSIS — I129 Hypertensive chronic kidney disease with stage 1 through stage 4 chronic kidney disease, or unspecified chronic kidney disease: Secondary | ICD-10-CM | POA: Diagnosis not present

## 2021-09-02 DIAGNOSIS — S81811S Laceration without foreign body, right lower leg, sequela: Secondary | ICD-10-CM | POA: Diagnosis not present

## 2021-09-02 DIAGNOSIS — N1832 Chronic kidney disease, stage 3b: Secondary | ICD-10-CM | POA: Diagnosis not present

## 2021-09-02 DIAGNOSIS — E876 Hypokalemia: Secondary | ICD-10-CM | POA: Diagnosis not present

## 2021-09-02 DIAGNOSIS — E1122 Type 2 diabetes mellitus with diabetic chronic kidney disease: Secondary | ICD-10-CM | POA: Diagnosis not present

## 2021-09-02 DIAGNOSIS — I5020 Unspecified systolic (congestive) heart failure: Secondary | ICD-10-CM | POA: Diagnosis not present

## 2021-09-02 DIAGNOSIS — E871 Hypo-osmolality and hyponatremia: Secondary | ICD-10-CM | POA: Diagnosis not present

## 2021-09-03 ENCOUNTER — Ambulatory Visit (INDEPENDENT_AMBULATORY_CARE_PROVIDER_SITE_OTHER): Payer: Medicare Other | Admitting: Cardiovascular Disease

## 2021-09-03 ENCOUNTER — Encounter: Payer: Self-pay | Admitting: Cardiovascular Disease

## 2021-09-03 ENCOUNTER — Other Ambulatory Visit: Payer: Self-pay

## 2021-09-03 VITALS — BP 120/62 | HR 53 | Ht 69.0 in | Wt 214.8 lb

## 2021-09-03 DIAGNOSIS — I491 Atrial premature depolarization: Secondary | ICD-10-CM | POA: Diagnosis not present

## 2021-09-03 DIAGNOSIS — Z7952 Long term (current) use of systemic steroids: Secondary | ICD-10-CM | POA: Diagnosis not present

## 2021-09-03 DIAGNOSIS — I5042 Chronic combined systolic (congestive) and diastolic (congestive) heart failure: Secondary | ICD-10-CM

## 2021-09-03 DIAGNOSIS — I700 Atherosclerosis of aorta: Secondary | ICD-10-CM | POA: Diagnosis not present

## 2021-09-03 DIAGNOSIS — E1122 Type 2 diabetes mellitus with diabetic chronic kidney disease: Secondary | ICD-10-CM

## 2021-09-03 DIAGNOSIS — E7800 Pure hypercholesterolemia, unspecified: Secondary | ICD-10-CM

## 2021-09-03 DIAGNOSIS — N184 Chronic kidney disease, stage 4 (severe): Secondary | ICD-10-CM | POA: Diagnosis not present

## 2021-09-03 MED ORDER — FUROSEMIDE 80 MG PO TABS: 80.0000 mg | ORAL_TABLET | Freq: Every day | ORAL | 2 refills | Status: DC

## 2021-09-03 NOTE — Patient Instructions (Signed)
Medication Instructions:  Furosemide: Hold Furosemide until weight is 215 lbs. Once it has reached 215 lbs, then resume the Furosemide at 80 mg once daily.  Call the office at [phone] for a weight over 220 lbs.   *If you need a refill on your cardiac medications before your next appointment, please call your pharmacy*   Lab Work: None ordered If you have labs (blood work) drawn today and your tests are completely normal, you will receive your results only by: [place] (if you have MyChart) OR A paper copy in the mail If you have any lab test that is abnormal or we need to change your treatment, we will call you to review the results.   Testing/Procedures: None ordered   Follow-Up: At [facility], you and your health needs are our priority.  As part of our continuing mission to provide you with exceptional heart care, we have created designated Provider Care Teams.  These Care Teams include your primary Cardiologist (physician) and Advanced Practice Providers (APPs -  Physician Assistants and Nurse Practitioners) who all work together to provide you with the care you need, when you need it.  We recommend signing up for the patient portal called "MyChart".  Sign up information is provided on this After Visit Summary.  MyChart is used to connect with patients for Virtual Visits (Telemedicine).  Patients are able to view lab/test results, encounter notes, upcoming appointments, etc.  Non-urgent messages can be sent to your provider as well.   To learn more about what you can do with MyChart, go to [email or web address].    Your next appointment:   2 month(s)  The format for your next appointment:   In Person  Provider:   Sanda Klein, MD

## 2021-09-03 NOTE — Progress Notes (Signed)
Cardiology Office Note:    Date:  09/04/2021   ID:  Robert Dickerson, DOB [birth year], MRN [id number]  PCP:  Robert Orn, MD   [place] Providers Cardiologist:  Sanda Klein, MD     Referring MD: Robert Orn, MD   Chief Complaint  Patient presents with   Congestive Heart Failure    Follow-up after hospitalization    History of Present Illness:    Robert Dickerson is a 81 y.o. 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  He was recently hospitalized with acute right and left heart failure after his diuretics were discontinued for worsening renal function.  It also appears that left ventricular systolic function has deteriorated with an ejection fraction that is now down to about 35%.  The pattern of LV dysfunction is global.  Coronary angiography was deferred to due to the risk of progression to end-stage renal disease.  The patient has stated that he does not want to ever go on dialysis.  Admission ECG raise concern for atrial fibrillation due to frequent PACs and baseline artifact, but careful review of stat electrocardiogram, repeat tracings and all his telemetry tracings do not show any evidence of true atrial fibrillation, just frequent PACs.  Based on previous weight, it appears that his dry weight is somewhere in the range of 218 pounds, 2 pounds.  He was discharged on furosemide 80 mg twice daily and his weight continue to decrease beyond that target, down to 213 pounds on his home scale.  I have asked him to stop taking furosemide until his weight is at least 215 pounds and then resume it at 80 mg once daily.  Despite the marked reduction in weight, he has not had dizziness lightheadedness or syncope.  He denies any muscular pain or cramps.  He does not have orthostatic hypotension  symptoms.  He no longer has orthopnea or PND.  He still has about 1+ symmetrical ankle swelling, markedly improved compared to his hospital stay.  He does not complain of chest pain and has not done so in the past either.  He has not had palpitations, intermittent claudication or focal neurological events.  He has not had any gout attacks.  At the onset of hospitalization on 08/24/2021 his creatinine was 2.46, by the time of discharge, after diuresis, it had reached 3.22 on 08/28/2021.  He had labs performed in Dr. Adin Hector office yesterday, but we do not have those results yet.  During his recent hospitalization labs showed a hemoglobin A1c of 8.2%, improved from May when it was 9.5%, but still worse than it was in January 2022 when it was 7.7%.  His LDL cholesterol was elevated at 122, although his HDL was pretty good at 65.  He does not have known CAD or PAD, but does have evidence of aortic atherosclerosis on imaging studies.  He was diagnosed with congestive heart failure in 2015 when his echocardiogram showed the EF 50-55% and "grade 1 diastolic dysfunction".  Frequent PACs were noted on that report.  A nuclear stress test performed in April 2016 did not show perfusion abnormalities.  He has never had cardiac catheterization.  Past Medical History:  Diagnosis Date   Arthritis    all joints   Chronic diastolic CHF (congestive heart failure) (HCC)    Chronic kidney disease (CKD), stage IV (  severe) (HCC)    Chronic kidney disease, stage 3 (HCC)    Congestive heart failure with LV diastolic dysfunction, NYHA class 2 ([place])    Family history of adverse reaction to anesthesia    father- change in personaility   GERD (gastroesophageal reflux disease)    Hypertension    Hypertension associated with diabetes ([place])    Hypertensive chronic kidney disease    Insulin dependent type 2 diabetes mellitus (HCC)    Neuropathy    OSA on CPAP    Sleep apnea    uses cpap, pt does not know settings     Past Surgical History:  Procedure Laterality Date   ANTERIOR LAT LUMBAR FUSION Left 01/22/2021   Procedure: Lumbar one-Lumbar two Lateral Lumbar Interbody Fusion;  Surgeon: Vallarie Mare, MD;  Location: [facility];  Service: Neurosurgery;  Laterality: Left;   ANTERIOR LAT LUMBAR FUSION  01/22/2021   APPENDECTOMY     BACK SURGERY     4 surg, lower   CHOLECYSTECTOMY     COLONOSCOPY WITH PROPOFOL N/A 06/11/2014   Procedure: COLONOSCOPY WITH PROPOFOL;  Surgeon: Garlan Fair, MD;  Location: [facility];  Service: Endoscopy;  Laterality: N/A;   ESOPHAGOGASTRODUODENOSCOPY  08/26/2011   Procedure: ESOPHAGOGASTRODUODENOSCOPY (EGD);  Surgeon: Garlan Fair, MD;  Location: [facility];  Service: Endoscopy;  Laterality: N/A;   ESOPHAGOGASTRODUODENOSCOPY (EGD) WITH PROPOFOL N/A 06/11/2014   Procedure: ESOPHAGOGASTRODUODENOSCOPY (EGD) WITH PROPOFOL;  Surgeon: Garlan Fair, MD;  Location: [facility];  Service: Endoscopy;  Laterality: N/A;   EUS  09/15/2011   Procedure: UPPER ENDOSCOPIC ULTRASOUND (EUS) LINEAR;  Surgeon: Landry Dyke, MD;  Location: [facility];  Service: Endoscopy;  Laterality: N/A;  MAC   I & D EXTREMITY Right 05/20/2021   Procedure: IRRIGATION AND DEBRIDEMENT OF LEG AND APPLICATION OF SKIN GRAFT;  Surgeon: Newt Minion, MD;  Location: [facility];  Service: Orthopedics;  Laterality: Right;   I & D EXTREMITY Right 07/03/2021   Procedure: REPEAT RIGHT LEG DEBRIDEMENT;  Surgeon: Newt Minion, MD;  Location: [facility];  Service: Orthopedics;  Laterality: Right;   I & D EXTREMITY Right 07/01/2021   Procedure: RIGHT LEG DEBRIDEMENT;  Surgeon: Newt Minion, MD;  Location: [facility];  Service: Orthopedics;  Laterality: Right;   KNEE ARTHROSCOPY Right YEARS AGO    Current Medications: Current Meds  Medication Sig   ACCU-CHEK GUIDE test strip daily before breakfast.   allopurinol (ZYLOPRIM) 300 MG tablet Take 300 mg by mouth daily.   atorvastatin (LIPITOR) 20 MG tablet Take 1  tablet (20 mg total) by mouth daily.   blood glucose meter kit and supplies KIT Dispense based on patient and insurance preference. Use up to four times daily as directed. (Patient taking differently: daily before breakfast.)   blood glucose meter kit and supplies by Other route as directed. Check blood sugar every morning   clobetasol cream (TEMOVATE) 2.37 % Apply 1 application topically 2 (two) times daily. (Patient taking differently: Apply 1 application topically 2 (two) times daily as needed (itching).)   hydrALAZINE (APRESOLINE) 10 MG tablet Take 1 tablet (10 mg total) by mouth every 8 (eight) hours.   hydrOXYzine (VISTARIL) 100 MG capsule Take 1 capsule (100 mg total) by mouth 3 (three) times daily as needed for itching.   isosorbide dinitrate (ISORDIL) 10 MG tablet Take 1 tablet (10 mg total) by mouth 3 (three) times daily.   metoprolol tartrate (LOPRESSOR) 25 MG tablet Take 1 tablet (25 mg total)  by mouth 2 (two) times daily.   NON FORMULARY CPAP  at bedtime   predniSONE (DELTASONE) 5 MG tablet Take 5 mg by mouth daily with breakfast.   pregabalin (LYRICA) 75 MG capsule Take 75 mg by mouth 2 (two) times daily.   [DISCONTINUED] allopurinol (ZYLOPRIM) 300 MG tablet Take 300 mg by mouth at bedtime.   [DISCONTINUED] furosemide (LASIX) 80 MG tablet Take 1 tablet (80 mg total) by mouth 2 (two) times daily.     Allergies:   Oxycodone hcl, Ace inhibitors, Lisinopril, and Oxycodone   Social History   Socioeconomic History   Marital status: Married    Spouse name: Robert Dickerson   Number of children: Not on file   Years of education: Not on file   Highest education level: Not on file  Occupational History   Occupation: retired  Tobacco Use   Smoking status: Former    Packs/day: 3.00    Years: 20.00    Pack years: 60.00    Types: Cigarettes    Quit date: 1984    Years since quitting: 39.1   Smokeless tobacco: Never  Vaping Use   Vaping Use: Never used  Substance and Sexual Activity    Alcohol use: Not Currently   Drug use: Never   Sexual activity: Not on file  Other Topics Concern   Not on file  Social History Narrative   ** Merged History Encounter **       Social Determinants of Health   Financial Resource Strain: Low Risk    Difficulty of Paying Living Expenses: Not hard at all  Food Insecurity: No Food Insecurity   Worried About Charity fundraiser in the Last Year: Never true   Arboriculturist in the Last Year: Never true  Transportation Needs: No Transportation Needs   Lack of Transportation (Medical): No   Lack of Transportation (Non-Medical): No  Physical Activity: Not on file  Stress: Not on file  Social Connections: Not on file     Family History: The patient's family history includes Hypertension in his mother. There is no history of Anesthesia problems, Hypotension, Malignant hyperthermia, or Pseudochol deficiency.  ROS:   Please see the history of present illness.     All other systems reviewed and are negative.  EKGs/Labs/Other Studies Reviewed:    The following studies were reviewed today:  Nuclear stress test 11/22/2014  Impression Exercise Capacity:  Fair exercise capacity. BP Response:  Normal blood pressure response. Clinical Symptoms:  No chest pain. ECG Impression:  No significant ST segment change suggestive of ischemia. Comparison with Prior Nuclear Study: No previous nuclear study performed   Overall Impression:  Normal stress nuclear study. No EKG changes of ischemia. No chest pain.   LV Ejection Fraction: 57%.  LV Wall Motion:  NL LV Function; NL Wall Motion    ECHO 09/04/2013  - Left ventricle: The cavity size was normal. Wall thickness    was normal. Systolic function was low normal to mildly    reduced. The estimated ejection fraction was in the range    of 50% to 55%. Wall motion was normal; there were no    regional wall motion abnormalities. Doppler parameters are    consistent with abnormal left ventricular  relaxation    (grade 1 diastolic dysfunction).  - Aortic valve: There was no stenosis. Trivial    regurgitation.  - Aorta: Mildly dilated aortic root.  - Mitral valve: Mildly calcified annulus. Mildly calcified    leaflets .  Trivial regurgitation.  - Left atrium: The atrium was mildly dilated.  - Right ventricle: The cavity size was normal. Systolic    function was normal.  - Pulmonary arteries: No complete TR doppler jet so unable    to estimate PA systolic pressure.  Echocardiogram 08/25/2021   1. Left ventricular ejection fraction, by estimation, is 30 to 35%. The  left ventricle has moderately decreased function. The left ventricle  demonstrates global hypokinesis. The left ventricular internal cavity size  was mildly dilated. There is mild  concentric left ventricular hypertrophy. Left ventricular diastolic  parameters are indeterminate.   2. Right ventricular systolic function is normal. The right ventricular  size is normal.   3. The mitral valve is grossly normal. No evidence of mitral valve  regurgitation.   4. The aortic valve was not well visualized. Aortic valve regurgitation  is not visualized. No aortic stenosis is present.   5. The inferior vena cava is dilated in size with <50% respiratory  variability, suggesting right atrial pressure of 15 mmHg.  EKG:  EKG is not ordered today.  Review of his ECG from recent hospital stay on 08/25/2021 shows sinus rhythm with PACs and first-degree AV block, nonspecific T wave changes  Recent Labs: 08/25/2021: ALT 16; Magnesium 2.2; TSH 4.104 08/26/2021: B Natriuretic Peptide 577.1 08/28/2021: BUN 90; Creatinine, Ser 3.22; Hemoglobin 11.2; Platelets 313; Potassium 3.8; Sodium 138  Recent Lipid Panel    Component Value Date/Time   CHOL 149 08/26/2021 0615   TRIG 76 08/26/2021 0615   HDL 64 08/26/2021 0615   CHOLHDL 2.3 08/26/2021 0615   VLDL 15 08/26/2021 0615   LDLCALC 70 08/26/2021 0615    Risk Assessment/Calculations:        Physical Exam:    VS:  BP 120/62    Pulse (!) 53    Ht _0  (1.753 m)    Wt 214 lb 12.8 oz (97.4 kg)    SpO2 97%    BMI 31.72 kg/m     Wt Readings from Last 3 Encounters:  09/03/21 214 lb 12.8 oz (97.4 kg)  08/28/21 221 lb 11.2 oz (100.6 kg)  07/01/21 220 lb (99.8 kg)      General: Alert, oriented x3, no distress, mildly obese Head: no evidence of trauma, PERRL, EOMI, no exophtalmos or lid lag, no myxedema, no xanthelasma; normal ears, nose and oropharynx Neck: normal jugular venous pulsations and no hepatojugular reflux; brisk carotid pulses without delay and no carotid bruits Chest: clear to auscultation, no signs of consolidation by percussion or palpation, normal fremitus, symmetrical and full respiratory excursions Cardiovascular: normal position and quality of the apical impulse, regular rhythm with rare ectopic beats, normal first and second heart sounds, no murmurs, rubs or gallops Abdomen: no tenderness or distention, no masses by palpation, no abnormal pulsatility or arterial bruits, normal bowel sounds, no hepatosplenomegaly Extremities: no clubbing, cyanosis; trace symmetrical ankle edema; 2+ radial, ulnar and brachial pulses bilaterally; 2+ right femoral, posterior tibial and dorsalis pedis pulses; 2+ left femoral, posterior tibial and dorsalis pedis pulses; no subclavian or femoral bruits Neurological: grossly nonfocal Psych: Normal mood and affect   ASSESSMENT:    1. Chronic combined systolic and diastolic heart failure ([place])   2. Type 2 diabetes mellitus with stage 4 chronic kidney disease, without long-term current use of insulin ([place])   3. Hypercholesterolemia   4. CKD (chronic kidney disease) stage 4, GFR 15-29 ml/min (HCC)   5. Current chronic use of systemic steroids  6. Aortic atherosclerosis ([place] )   7. PAC (premature atrial contraction)     PLAN:    In order of problems listed above:  CHF: Recent exacerbation occurred after stopping loop  diuretics, but there is also evidence of left ventricular systolic dysfunction, which is new.  It is quite possible that he has underlying coronary disease, but we have decided not to perform coronary angiography since the risk of acute renal failure is real and he reports that he would never want to go on hemodialysis.  Plan to treat with hydralazine/nitrates and beta-blockers.  NYHA functional class I-2 currently.  He appears to be clinically euvolemic with the exception of minimal ankle swelling.  In fact, on his home scale he weighed only 213 pounds and our target "dry weight" is around 218 pounds.  We discussed the fact that the goal is not to get rid of every drop of excess fluid because we need to protect kidney function as well.  As long as he does not have orthopnea, PND or severe exertional dyspnea should tolerate some fluid excess.  I think he needs to gain back some fluid to at least 215 pounds and he should not restart furosemide until he is over that weight.  He would then resume it at 80 mg once daily.  We will get the labs from yesterday from Dr. Joelyn Oms.  Consider SGLT2 inhibitor if renal function allows and is stabilized. DM: Improving glycemic control, but was not yet at target. HLP: Target LDL less than 70.  Atorvastatin dose adjusted recently.  Recheck lipids in 3 months HTN: Well compensated on current medications.  At least for the time being we will avoid any RAAS inhibitors. CKD4: Recent worsening of kidney function.  We will get the labs from yesterday performed at [facility]. Chronic steroid therapy: He has been on steroids now without interruption for well over 2 years.  He understands that he may need stress dose corticosteroids for surgery or acute illness. Aortic atherosclerosis: Also has evidence of coronary calcifications on CT.  Although it is possible he has multivessel CAD, we have decided not to pursue this diagnosis due to his kidney problems.  On aspirin and  statin. PACs: Frequent and mimic atrial fibrillation, but true atrial fibrillation has not been confirmed.         Medication Adjustments/Labs and Tests Ordered: Current medicines are reviewed at length with the patient today.  Concerns regarding medicines are outlined above.  No orders of the defined types were placed in this encounter.  Meds ordered this encounter  Medications   furosemide (LASIX) 80 MG tablet    Sig: Take 1 tablet (80 mg total) by mouth daily.    Dispense:  30 tablet    Refill:  2     Patient Instructions  Medication Instructions:  Furosemide: Hold Furosemide until weight is 215 lbs. Once it has reached 215 lbs, then resume the Furosemide at 80 mg once daily.  Call the office at [phone] for a weight over 220 lbs.   *If you need a refill on your cardiac medications before your next appointment, please call your pharmacy*   Lab Work: None ordered If you have labs (blood work) drawn today and your tests are completely normal, you will receive your results only by: [place] (if you have MyChart) OR A paper copy in the mail If you have any lab test that is abnormal or we need to change your treatment, we will call you to review  the results.   Testing/Procedures: None ordered   Follow-Up: At [facility], you and your health needs are our priority.  As part of our continuing mission to provide you with exceptional heart care, we have created designated Provider Care Teams.  These Care Teams include your primary Cardiologist (physician) and Advanced Practice Providers (APPs -  Physician Assistants and Nurse Practitioners) who all work together to provide you with the care you need, when you need it.  We recommend signing up for the patient portal called "MyChart".  Sign up information is provided on this After Visit Summary.  MyChart is used to connect with patients for Virtual Visits (Telemedicine).  Patients are able to view lab/test results,  encounter notes, upcoming appointments, etc.  Non-urgent messages can be sent to your provider as well.   To learn more about what you can do with MyChart, go to [email or web address].    Your next appointment:   2 month(s)  The format for your next appointment:   In Person  Provider:   Sanda Klein, MD       Signed, Sanda Klein, MD  09/04/2021 5:28 PM    [place] '

## 2021-09-04 ENCOUNTER — Encounter: Payer: Self-pay | Admitting: Cardiovascular Disease

## 2021-09-04 DIAGNOSIS — N179 Acute kidney failure, unspecified: Secondary | ICD-10-CM | POA: Diagnosis not present

## 2021-09-04 DIAGNOSIS — E7800 Pure hypercholesterolemia, unspecified: Secondary | ICD-10-CM | POA: Insufficient documentation

## 2021-09-04 DIAGNOSIS — I700 Atherosclerosis of aorta: Secondary | ICD-10-CM | POA: Insufficient documentation

## 2021-09-04 DIAGNOSIS — N184 Chronic kidney disease, stage 4 (severe): Secondary | ICD-10-CM | POA: Diagnosis not present

## 2021-09-04 DIAGNOSIS — E1122 Type 2 diabetes mellitus with diabetic chronic kidney disease: Secondary | ICD-10-CM | POA: Diagnosis not present

## 2021-09-04 DIAGNOSIS — I130 Hypertensive heart and chronic kidney disease with heart failure and stage 1 through stage 4 chronic kidney disease, or unspecified chronic kidney disease: Secondary | ICD-10-CM | POA: Diagnosis not present

## 2021-09-04 DIAGNOSIS — E1165 Type 2 diabetes mellitus with hyperglycemia: Secondary | ICD-10-CM | POA: Diagnosis not present

## 2021-09-04 DIAGNOSIS — I5033 Acute on chronic diastolic (congestive) heart failure: Secondary | ICD-10-CM | POA: Diagnosis not present

## 2021-09-09 DIAGNOSIS — I130 Hypertensive heart and chronic kidney disease with heart failure and stage 1 through stage 4 chronic kidney disease, or unspecified chronic kidney disease: Secondary | ICD-10-CM | POA: Diagnosis not present

## 2021-09-09 DIAGNOSIS — E1122 Type 2 diabetes mellitus with diabetic chronic kidney disease: Secondary | ICD-10-CM | POA: Diagnosis not present

## 2021-09-09 DIAGNOSIS — E1165 Type 2 diabetes mellitus with hyperglycemia: Secondary | ICD-10-CM | POA: Diagnosis not present

## 2021-09-09 DIAGNOSIS — N179 Acute kidney failure, unspecified: Secondary | ICD-10-CM | POA: Diagnosis not present

## 2021-09-09 DIAGNOSIS — N184 Chronic kidney disease, stage 4 (severe): Secondary | ICD-10-CM | POA: Diagnosis not present

## 2021-09-09 DIAGNOSIS — I5033 Acute on chronic diastolic (congestive) heart failure: Secondary | ICD-10-CM | POA: Diagnosis not present

## 2021-09-10 ENCOUNTER — Other Ambulatory Visit: Payer: Self-pay

## 2021-09-10 ENCOUNTER — Encounter: Payer: Self-pay | Admitting: Orthopedic Surgery

## 2021-09-10 ENCOUNTER — Ambulatory Visit (INDEPENDENT_AMBULATORY_CARE_PROVIDER_SITE_OTHER): Payer: Medicare Other | Admitting: Orthopedic Surgery

## 2021-09-10 DIAGNOSIS — N184 Chronic kidney disease, stage 4 (severe): Secondary | ICD-10-CM | POA: Diagnosis not present

## 2021-09-10 DIAGNOSIS — Z945 Skin transplant status: Secondary | ICD-10-CM

## 2021-09-10 NOTE — Progress Notes (Signed)
Office Visit Note   Patient: Robert Dickerson           Date of Birth: 1940-11-21           MRN: [id number] Visit Date: 09/10/2021              Requested by: Lavone Orn, MD [address] [place],  [place] [address] PCP: Lavone Orn, MD  Chief Complaint  Patient presents with   Right Leg - Routine Post Op    07/03/21 RLE deb w/ kerecis graft      HPI: Patient is a 81 year old gentleman is status post debridement and application of Kerecis graft to the right lower extremity he is currently in an extra-large compression sock.  Patient states he has recently been hospitalized for heart failure and renal failure he is currently holding his Lasix.  Assessment & Plan: Visit Diagnoses: No diagnosis found.  Plan: Patient was given a prescription to obtain a size large compression stocking.  He will change this daily.  Follow-Up Instructions: Return in about 4 weeks (around 10/08/2021).   Ortho Exam  Patient is alert, oriented, no adenopathy, well-dressed, normal affect, normal respiratory effort. Examination there is dermatitis and fibrinous tissue over the wounds.  Patient is short of breath while seated.  There is scab around the wound edges that was removed there is healthy granulation tissue along the wound edges.  Imaging: No results found.    Labs: Lab Results  Component Value Date   HGBA1C 8.2 (H) 08/25/2021   HGBA1C 7.6 (H) 05/19/2021   HGBA1C 9.5 (H) 12/23/2020   LABURIC 6.4 03/04/2011   REPTSTATUS 07/06/2021 FINAL 07/01/2021   GRAMSTAIN NO WBC SEEN NO ORGANISMS SEEN  07/01/2021   CULT  07/01/2021    FEW STAPHYLOCOCCUS AUREUS RARE PROTEUS MIRABILIS NO ANAEROBES ISOLATED Performed at [facility], [place] [address], [place], [place] [address]    [place] 07/01/2021   LABORGA PROTEUS MIRABILIS 07/01/2021     Lab Results  Component Value Date   ALBUMIN 2.4 (L) 08/27/2021   ALBUMIN 2.5 (L) 08/25/2021   ALBUMIN 2.6  (L) 06/05/2021    Lab Results  Component Value Date   MG 2.2 08/25/2021   MG 2.2 05/19/2021   MG 2.0 12/23/2020   No results found for: VD25OH  No results found for: PREALBUMIN CBC EXTENDED Latest Ref Rng & Units 08/28/2021 08/27/2021 08/26/2021  WBC 4.0 - 10.5 K/uL 7.6 10.2 14.4(H)  RBC 4.22 - 5.81 MIL/uL 3.97(L) 3.98(L) 4.12(L)  HGB 13.0 - 17.0 g/dL 11.2(L) 11.2(L) 11.7(L)  HCT 39.0 - 52.0 % 33.9(L) 34.4(L) 36.2(L)  PLT 150 - 400 K/uL 313 329 300  NEUTROABS 1.7 - 7.7 K/uL - - -  LYMPHSABS 0.7 - 4.0 K/uL - - -     There is no height or weight on file to calculate BMI.  Orders:  No orders of the defined types were placed in this encounter.  No orders of the defined types were placed in this encounter.    Procedures: No procedures performed  Clinical Data: No additional findings.  ROS:  All other systems negative, except as noted in the HPI. Review of Systems  Objective: Vital Signs: There were no vitals taken for this visit.  Specialty Comments:  No specialty comments available.  PMFS History: Patient Active Problem List   Diagnosis Date Noted   Aortic atherosclerosis ([place]) 09/04/2021   Hypercholesterolemia 09/04/2021   Unspecified atrial fibrillation ([place]) 08/25/2021  Acute on chronic diastolic CHF (congestive heart failure) ([place]) 08/25/2021   Wound of right leg, sequela 07/01/2021   Nail, injury by 06/10/2021   Laceration of leg 05/20/2021   Acute respiratory failure with hypoxia ([place]) 05/18/2021   Chronic combined systolic and diastolic heart failure ([place]) 05/18/2021   Chronic kidney disease (CKD), stage IV (severe) ([place]) 05/18/2021   Hypertension associated with diabetes ([place]) 05/18/2021   Insulin dependent type 2 diabetes mellitus (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   Chronic kidney disease, stage 4 (severe) ([place]) 12/03/2020   Degeneration of lumbar intervertebral disc 12/03/2020   Type 2 diabetes mellitus with stage 4 chronic kidney disease ([place]) 12/03/2020   Diabetic peripheral neuropathy associated with type 2 diabetes mellitus ([place]) 49/70/2637   Diastolic heart failure ([place]) 12/03/2020   Enlarged prostate 12/03/2020   Erectile dysfunction due to arterial insufficiency 12/03/2020   Essential hypertension 12/03/2020   Gastro-esophageal reflux disease without esophagitis 12/03/2020   Hematuria 12/03/2020   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   Morbid obesity ([place]) 12/03/2020   Osteoarthritis of hip 12/03/2020   Osteoarthritis of knee 12/03/2020   Polymyalgia rheumatica ([place]) 12/03/2020   Restless legs 12/03/2020   Skin sensation disturbance 12/03/2020   Lumbar stenosis 11/21/2020   Tendinitis 09/03/2020   Subacute arthropathy 09/03/2020   Bilateral hearing loss 08/11/2020   Impacted cerumen of left ear 03/10/2017   Excessive cerumen in left ear canal 03/10/2017   Faintness 12/09/2015   Backache 11/14/2014   CHRONIC PANCREATITIS 06/16/2009   Past Medical History:  Diagnosis Date   Arthritis    all joints   Chronic diastolic CHF (congestive heart failure) (HCC)    Chronic kidney disease (CKD), stage IV (severe) (HCC)    Chronic kidney disease, stage 3 (HCC)    Congestive heart failure with LV diastolic dysfunction, NYHA class 2 ([place])    Family history of adverse reaction to anesthesia    father- change in personaility   GERD (gastroesophageal reflux disease)    Hypertension    Hypertension associated with diabetes ([place])    Hypertensive chronic kidney disease    Insulin dependent type 2 diabetes mellitus ([place])    Neuropathy    OSA on CPAP    Sleep apnea    uses cpap, pt does not know settings    Family  History  Problem Relation Age of Onset   Hypertension Mother    Anesthesia problems Neg Hx    Hypotension Neg Hx    Malignant hyperthermia Neg Hx    Pseudochol deficiency Neg Hx     Past Surgical History:  Procedure Laterality Date   ANTERIOR LAT LUMBAR FUSION Left 01/22/2021   Procedure: Lumbar one-Lumbar two Lateral Lumbar Interbody Fusion;  Surgeon: Vallarie Mare, MD;  Location: [facility];  Service: Neurosurgery;  Laterality: Left;   ANTERIOR LAT LUMBAR FUSION  01/22/2021   APPENDECTOMY     BACK SURGERY     4 surg, lower   CHOLECYSTECTOMY     COLONOSCOPY WITH PROPOFOL N/A 06/11/2014   Procedure: COLONOSCOPY WITH PROPOFOL;  Surgeon: Garlan Fair, MD;  [facility];  Service: Endoscopy;  Laterality: N/A;   ESOPHAGOGASTRODUODENOSCOPY  08/26/2011   Procedure: ESOPHAGOGASTRODUODENOSCOPY (EGD);  Surgeon: Hassell Done  Sandria Senter, MD;  [facility];  Service: Endoscopy;  Laterality: N/A;   ESOPHAGOGASTRODUODENOSCOPY (EGD) WITH PROPOFOL N/A 06/11/2014   Procedure: ESOPHAGOGASTRODUODENOSCOPY (EGD) WITH PROPOFOL;  Surgeon: Garlan Fair, MD;  [facility];  Service: Endoscopy;  Laterality: N/A;   EUS  09/15/2011   Procedure: UPPER ENDOSCOPIC ULTRASOUND (EUS) LINEAR;  Surgeon: Landry Dyke, MD;  [facility];  Service: Endoscopy;  Laterality: N/A;  MAC   I & D EXTREMITY Right 05/20/2021   Procedure: IRRIGATION AND DEBRIDEMENT OF LEG AND APPLICATION OF SKIN GRAFT;  Surgeon: Newt Minion, MD;  Location: [facility];  Service: Orthopedics;  Laterality: Right;   I & D EXTREMITY Right 07/03/2021   Procedure: REPEAT RIGHT LEG DEBRIDEMENT;  Surgeon: Newt Minion, MD;  Location: [facility];  Service: Orthopedics;  Laterality: Right;   I & D EXTREMITY Right 07/01/2021   Procedure: RIGHT LEG DEBRIDEMENT;  Surgeon: Newt Minion, MD;  Location: [facility];  Service: Orthopedics;  Laterality: Right;   KNEE ARTHROSCOPY Right YEARS AGO   Social History   Occupational History    Occupation: retired  Tobacco Use   Smoking status: Former    Packs/day: 3.00    Years: 20.00    Pack years: 60.00    Types: Cigarettes    Quit date: 1984    Years since quitting: 39.1   Smokeless tobacco: Never  Vaping Use   Vaping Use: Never used  Substance and Sexual Activity   Alcohol use: Not Currently   Drug use: Never   Sexual activity: Not on file

## 2021-09-11 ENCOUNTER — Ambulatory Visit (INDEPENDENT_AMBULATORY_CARE_PROVIDER_SITE_OTHER): Payer: Medicare Other | Admitting: Podiatry

## 2021-09-11 ENCOUNTER — Encounter: Payer: Self-pay | Admitting: Podiatry

## 2021-09-11 DIAGNOSIS — M79675 Pain in left toe(s): Secondary | ICD-10-CM

## 2021-09-11 DIAGNOSIS — B351 Tinea unguium: Secondary | ICD-10-CM

## 2021-09-11 DIAGNOSIS — G609 Hereditary and idiopathic neuropathy, unspecified: Secondary | ICD-10-CM | POA: Diagnosis not present

## 2021-09-11 DIAGNOSIS — E1142 Type 2 diabetes mellitus with diabetic polyneuropathy: Secondary | ICD-10-CM

## 2021-09-11 DIAGNOSIS — M79674 Pain in right toe(s): Secondary | ICD-10-CM

## 2021-09-11 NOTE — Progress Notes (Signed)
This patient returns to the office for evaluation and treatment of long thick painful nails .  This patient is unable to trim his own nails since the patient cannot reach his feet.  Patient says the nails are painful walking and wearing his shoes. Patient has history of back surgery. Patient was in [place] in October 2022.    He returns for preventive foot care services.  He presents to the office with his wife.  General Appearance  Alert, conversant and in no acute stress.  Vascular  Dorsalis pedis and posterior tibial  pulses are palpable  bilaterally.  Capillary return is within normal limits  bilaterally. Temperature is within normal limits  bilaterally.  Neurologic  Senn-Weinstein monofilament wire test diminished  bilaterally. Muscle power within normal limits bilaterally.  Nails Thick disfigured discolored nails with subungual debris  from hallux to second  toes bilaterally. No evidence of bacterial infection or drainage bilaterally.Lateral distal aspect of the second toenail is absent.  Orthopedic  No limitations of motion  feet .  No crepitus or effusions noted.  No bony pathology or digital deformities noted. Prominent metatarsal heads  B/L.  Skin  Dry scaly  skin with no porokeratosis noted bilaterally.  No signs of infections or ulcers noted.     Onychomycosis  Pain in toes right foot  Pain in toes left foot    Debridement  of nails  1-5  B/L with a nail nipper.  Nails were then filed using a dremel tool with no incidents.   RTC 3 months    Gardiner Barefoot DPM

## 2021-09-14 ENCOUNTER — Telehealth: Payer: Self-pay

## 2021-09-14 ENCOUNTER — Ambulatory Visit (INDEPENDENT_AMBULATORY_CARE_PROVIDER_SITE_OTHER): Payer: Medicare Other | Admitting: Orthopedic Surgery

## 2021-09-14 ENCOUNTER — Other Ambulatory Visit: Payer: Self-pay

## 2021-09-14 DIAGNOSIS — S81811D Laceration without foreign body, right lower leg, subsequent encounter: Secondary | ICD-10-CM | POA: Diagnosis not present

## 2021-09-14 DIAGNOSIS — Z945 Skin transplant status: Secondary | ICD-10-CM | POA: Diagnosis not present

## 2021-09-14 MED ORDER — PENTOXIFYLLINE ER 400 MG PO TBCR: 400.0000 mg | EXTENDED_RELEASE_TABLET | Freq: Three times a day (TID) | ORAL | 3 refills | Status: DC

## 2021-09-14 MED ORDER — OXYCODONE-ACETAMINOPHEN 5-325 MG PO TABS: 1.0000 | ORAL_TABLET | ORAL | 0 refills | Status: DC | PRN

## 2021-09-14 MED ORDER — DOXYCYCLINE HYCLATE 100 MG PO TABS: 100.0000 mg | ORAL_TABLET | Freq: Two times a day (BID) | ORAL | 0 refills | Status: DC

## 2021-09-14 NOTE — Telephone Encounter (Signed)
Pt is coming in this afternoon for wound check will let him know can use larger sock at apt.

## 2021-09-14 NOTE — Telephone Encounter (Signed)
Pt informed to move to larger size compression sock.

## 2021-09-14 NOTE — Telephone Encounter (Signed)
Patient called into the office and wanted to know if he can go back to the larger compression sock he stated that the smaller sock that was giving to him is about to kill him.  Please advise

## 2021-09-14 NOTE — Telephone Encounter (Signed)
Please see below.

## 2021-09-16 DIAGNOSIS — I129 Hypertensive chronic kidney disease with stage 1 through stage 4 chronic kidney disease, or unspecified chronic kidney disease: Secondary | ICD-10-CM | POA: Diagnosis not present

## 2021-09-16 DIAGNOSIS — N2581 Secondary hyperparathyroidism of renal origin: Secondary | ICD-10-CM | POA: Diagnosis not present

## 2021-09-16 DIAGNOSIS — M109 Gout, unspecified: Secondary | ICD-10-CM | POA: Diagnosis not present

## 2021-09-16 DIAGNOSIS — D631 Anemia in chronic kidney disease: Secondary | ICD-10-CM | POA: Diagnosis not present

## 2021-09-16 DIAGNOSIS — N184 Chronic kidney disease, stage 4 (severe): Secondary | ICD-10-CM | POA: Diagnosis not present

## 2021-09-16 DIAGNOSIS — I5020 Unspecified systolic (congestive) heart failure: Secondary | ICD-10-CM | POA: Diagnosis not present

## 2021-09-16 DIAGNOSIS — E1122 Type 2 diabetes mellitus with diabetic chronic kidney disease: Secondary | ICD-10-CM | POA: Diagnosis not present

## 2021-09-16 DIAGNOSIS — N189 Chronic kidney disease, unspecified: Secondary | ICD-10-CM | POA: Diagnosis not present

## 2021-09-16 NOTE — Progress Notes (Signed)
Office Visit Note   Patient: Robert Dickerson           Date of Birth: 05-22-1941           MRN: [id number] Visit Date: 09/14/2021              Requested by: Lavone Orn, MD [address] [place],  [place] [address] PCP: Lavone Orn, MD  Chief Complaint  Patient presents with   Right Leg - Follow-up, Wound Check    07/03/21 repeat right leg debridement      HPI: Patient is a 81 year old gentleman who is status post repeat irrigation and debridement of the right leg approximately 6 weeks ago.  Patient complains of increasing swelling pain and neuropathy.  Patient states that his kidney function is getting worse.  Assessment & Plan: Visit Diagnoses:  1. Laceration of right lower leg, subsequent encounter   2. History of skin graft     Plan: Continue with the 3 layer compression wrap prescription was provided for doxycycline [place] tall and Percocet.  Follow-Up Instructions: Return in about 1 week (around 09/21/2021).   Ortho Exam  Patient is alert, oriented, no adenopathy, well-dressed, normal affect, normal respiratory effort. Examination there is no cellulitis there is increased swelling and dermatitis of the right leg.  No interval epithelialization.  The Doppler was used and he has a strong biphasic dorsalis pedis and posterior tibial pulse.  Imaging: No results found. No images are attached to the encounter.  Labs: Lab Results  Component Value Date   HGBA1C 8.2 (H) 08/25/2021   HGBA1C 7.6 (H) 05/19/2021   HGBA1C 9.5 (H) 12/23/2020   LABURIC 6.4 03/04/2011   REPTSTATUS 07/06/2021 FINAL 07/01/2021   GRAMSTAIN NO WBC SEEN NO ORGANISMS SEEN  07/01/2021   CULT  07/01/2021    FEW STAPHYLOCOCCUS AUREUS RARE PROTEUS MIRABILIS NO ANAEROBES ISOLATED Performed at [facility], [place] [address], [place], [place] [address]    [place] 07/01/2021   LABORGA PROTEUS MIRABILIS 07/01/2021     Lab Results  Component Value Date    ALBUMIN 2.4 (L) 08/27/2021   ALBUMIN 2.5 (L) 08/25/2021   ALBUMIN 2.6 (L) 06/05/2021    Lab Results  Component Value Date   MG 2.2 08/25/2021   MG 2.2 05/19/2021   MG 2.0 12/23/2020   No results found for: VD25OH  No results found for: PREALBUMIN CBC EXTENDED Latest Ref Rng & Units 08/28/2021 08/27/2021 08/26/2021  WBC 4.0 - 10.5 K/uL 7.6 10.2 14.4(H)  RBC 4.22 - 5.81 MIL/uL 3.97(L) 3.98(L) 4.12(L)  HGB 13.0 - 17.0 g/dL 11.2(L) 11.2(L) 11.7(L)  HCT 39.0 - 52.0 % 33.9(L) 34.4(L) 36.2(L)  PLT 150 - 400 K/uL 313 329 300  NEUTROABS 1.7 - 7.7 K/uL - - -  LYMPHSABS 0.7 - 4.0 K/uL - - -     There is no height or weight on file to calculate BMI.  Orders:  No orders of the defined types were placed in this encounter.  Meds ordered this encounter  Medications   pentoxifylline (TRENTAL) 400 MG CR tablet    Sig: Take 1 tablet (400 mg total) by mouth 3 (three) times daily with meals.    Dispense:  90 tablet    Refill:  3   oxyCODONE-acetaminophen (PERCOCET/ROXICET) 5-325 MG tablet    Sig: Take 1 tablet by mouth every 4 (four) hours as needed for severe pain.    Dispense:  30 tablet    Refill:  0   doxycycline (VIBRA-TABS) 100 MG tablet    Sig: Take 1 tablet (100 mg total) by mouth 2 (two) times daily.    Dispense:  30 tablet    Refill:  0     Procedures: No procedures performed  Clinical Data: No additional findings.  ROS:  All other systems negative, except as noted in the HPI. Review of Systems  Objective: Vital Signs: There were no vitals taken for this visit.  Specialty Comments:  No specialty comments available.  PMFS History: Patient Active Problem List   Diagnosis Date Noted   Aortic atherosclerosis ([place]) 09/04/2021   Hypercholesterolemia 09/04/2021   Unspecified atrial fibrillation ([place]) 08/25/2021   Acute on chronic diastolic CHF (congestive heart failure) ([place]) 08/25/2021   Wound of right leg, sequela 07/01/2021   Nail, injury by 06/10/2021    Laceration of leg 05/20/2021   Acute respiratory failure with hypoxia ([place]) 05/18/2021   Chronic combined systolic and diastolic heart failure ([place]) 05/18/2021   Chronic kidney disease (CKD), stage IV (severe) ([place]) 05/18/2021   Hypertension associated with diabetes ([place]) 05/18/2021   Insulin dependent type 2 diabetes mellitus ([place]) 05/18/2021   OSA on CPAP 05/18/2021   Laceration of right lower extremity 05/18/2021   Current chronic use of systemic steroids 01/08/2021   Preoperative cardiovascular examination 01/08/2021   Cellulitis 12/23/2020   Benign neoplasm of duodenum, jejunum, 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   Chronic kidney disease, stage 4 (severe) ([place]) 12/03/2020   Degeneration of lumbar intervertebral disc 12/03/2020   Type 2 diabetes mellitus with stage 4 chronic kidney disease ([place]) 12/03/2020   Diabetic peripheral neuropathy associated with type 2 diabetes mellitus ([place]) 95/28/4132   Diastolic heart failure ([place]) 12/03/2020   Enlarged prostate 12/03/2020   Erectile dysfunction due to arterial insufficiency 12/03/2020   Essential hypertension 12/03/2020   Gastro-esophageal reflux disease without esophagitis 12/03/2020   Hematuria 12/03/2020   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   Morbid obesity ([place]) 12/03/2020   Osteoarthritis of hip 12/03/2020   Osteoarthritis of knee 12/03/2020   Polymyalgia rheumatica ([place]) 12/03/2020   Restless legs 12/03/2020   Skin sensation disturbance 12/03/2020   Lumbar stenosis 11/21/2020   Tendinitis 09/03/2020   Subacute arthropathy 09/03/2020   Bilateral hearing loss 08/11/2020   Impacted cerumen of left ear 03/10/2017   Excessive cerumen in left ear canal 03/10/2017   Faintness 12/09/2015   Backache 11/14/2014   CHRONIC PANCREATITIS 06/16/2009   Past Medical History:  Diagnosis Date   Arthritis    all joints   Chronic diastolic CHF (congestive heart failure) (HCC)    Chronic  kidney disease (CKD), stage IV (severe) (HCC)    Chronic kidney disease, stage 3 (HCC)    Congestive heart failure with LV diastolic dysfunction, NYHA class 2 ([place])    Family history of adverse reaction to anesthesia    father- change in personaility   GERD (gastroesophageal reflux disease)    Hypertension    Hypertension associated with diabetes ([place])    Hypertensive chronic kidney disease    Insulin dependent type 2 diabetes mellitus ([place])    Neuropathy    OSA on CPAP    Sleep apnea    uses cpap, pt does not know settings    Family History  Problem Relation Age of Onset   Hypertension Mother    Anesthesia problems Neg Hx    Hypotension Neg Hx    Malignant hyperthermia Neg Hx    Pseudochol deficiency  Neg Hx     Past Surgical History:  Procedure Laterality Date   ANTERIOR LAT LUMBAR FUSION Left 01/22/2021   Procedure: Lumbar one-Lumbar two Lateral Lumbar Interbody Fusion;  Surgeon: Vallarie Mare, MD;  Location: [facility];  Service: Neurosurgery;  Laterality: Left;   ANTERIOR LAT LUMBAR FUSION  01/22/2021   APPENDECTOMY     BACK SURGERY     4 surg, lower   CHOLECYSTECTOMY     COLONOSCOPY WITH PROPOFOL N/A 06/11/2014   Procedure: COLONOSCOPY WITH PROPOFOL;  Surgeon: Garlan Fair, MD;  [facility];  Service: Endoscopy;  Laterality: N/A;   ESOPHAGOGASTRODUODENOSCOPY  08/26/2011   Procedure: ESOPHAGOGASTRODUODENOSCOPY (EGD);  Surgeon: Garlan Fair, MD;  [facility];  Service: Endoscopy;  Laterality: N/A;   ESOPHAGOGASTRODUODENOSCOPY (EGD) WITH PROPOFOL N/A 06/11/2014   Procedure: ESOPHAGOGASTRODUODENOSCOPY (EGD) WITH PROPOFOL;  Surgeon: Garlan Fair, MD;  [facility];  Service: Endoscopy;  Laterality: N/A;   EUS  09/15/2011   Procedure: UPPER ENDOSCOPIC ULTRASOUND (EUS) LINEAR;  Surgeon: Landry Dyke, MD;  [facility];  Service: Endoscopy;  Laterality: N/A;  MAC   I & D EXTREMITY Right 05/20/2021   Procedure: IRRIGATION AND  DEBRIDEMENT OF LEG AND APPLICATION OF SKIN GRAFT;  Surgeon: Newt Minion, MD;  Location: [facility];  Service: Orthopedics;  Laterality: Right;   I & D EXTREMITY Right 07/03/2021   Procedure: REPEAT RIGHT LEG DEBRIDEMENT;  Surgeon: Newt Minion, MD;  Location: [facility];  Service: Orthopedics;  Laterality: Right;   I & D EXTREMITY Right 07/01/2021   Procedure: RIGHT LEG DEBRIDEMENT;  Surgeon: Newt Minion, MD;  Location: [facility];  Service: Orthopedics;  Laterality: Right;   KNEE ARTHROSCOPY Right YEARS AGO   Social History   Occupational History   Occupation: retired  Tobacco Use   Smoking status: Former    Packs/day: 3.00    Years: 20.00    Pack years: 60.00    Types: Cigarettes    Quit date: 1984    Years since quitting: 39.1   Smokeless tobacco: Never  Vaping Use   Vaping Use: Never used  Substance and Sexual Activity   Alcohol use: Not Currently   Drug use: Never   Sexual activity: Not on file

## 2021-09-21 ENCOUNTER — Ambulatory Visit (INDEPENDENT_AMBULATORY_CARE_PROVIDER_SITE_OTHER): Payer: Medicare Other | Admitting: Orthopedic Surgery

## 2021-09-21 ENCOUNTER — Other Ambulatory Visit: Payer: Self-pay

## 2021-09-21 DIAGNOSIS — Z945 Skin transplant status: Secondary | ICD-10-CM

## 2021-09-21 DIAGNOSIS — S81811D Laceration without foreign body, right lower leg, subsequent encounter: Secondary | ICD-10-CM | POA: Diagnosis not present

## 2021-09-22 ENCOUNTER — Encounter: Payer: Self-pay | Admitting: Orthopedic Surgery

## 2021-09-22 ENCOUNTER — Ambulatory Visit (INDEPENDENT_AMBULATORY_CARE_PROVIDER_SITE_OTHER): Payer: Medicare Other | Admitting: Family

## 2021-09-22 ENCOUNTER — Encounter: Payer: Self-pay | Admitting: Family

## 2021-09-22 DIAGNOSIS — L209 Atopic dermatitis, unspecified: Secondary | ICD-10-CM

## 2021-09-22 DIAGNOSIS — Z945 Skin transplant status: Secondary | ICD-10-CM | POA: Diagnosis not present

## 2021-09-22 MED ORDER — CLOBETASOL PROPIONATE 0.05 % EX CREA: 1.0000 "application " | TOPICAL_CREAM | Freq: Two times a day (BID) | CUTANEOUS | 0 refills | Status: DC

## 2021-09-22 NOTE — Progress Notes (Signed)
Office Visit Note   Patient: Robert Dickerson           Date of Birth: 08/15/1940           MRN: [id number] Visit Date: 09/21/2021              Requested by: Lavone Orn, MD [address] [place],  [place] [address] PCP: Lavone Orn, MD  Chief Complaint  Patient presents with   Right Leg - Routine Post Op    07/03/21 RLE debridement      HPI: Patient is a 81 year old gentleman who is status post skin graft to ulcerations right leg.  Previous debridement December 2.  He is on doxycycline and Trental and has restarted his Lasix 80 mg a day.  Patient states he felt like the dressing was cutting into his ankle and he cut off the dressing.  Assessment & Plan: Visit Diagnoses:  1. Laceration of right lower leg, subsequent encounter   2. History of skin graft     Plan: Follow-up in 1 week for nurse visit dressing change with Dynaflex and silver cell and follow-up with MD in 2 weeks.  Follow-Up Instructions: Return in about 1 week (around 09/28/2021) for Nurse visit 1 week for dressing change, MD visit in 2 weeks.Manson Passey Exam  Patient is alert, oriented, no adenopathy, well-dressed, normal affect, normal respiratory effort. Examination there is decreased swelling there is some maceration around the wound from drainage there is approximately 50% fibrinous exudative tissue over the wound.  Imaging: No results found.     Labs: Lab Results  Component Value Date   HGBA1C 8.2 (H) 08/25/2021   HGBA1C 7.6 (H) 05/19/2021   HGBA1C 9.5 (H) 12/23/2020   LABURIC 6.4 03/04/2011   REPTSTATUS 07/06/2021 FINAL 07/01/2021   GRAMSTAIN NO WBC SEEN NO ORGANISMS SEEN  07/01/2021   CULT  07/01/2021    FEW STAPHYLOCOCCUS AUREUS RARE PROTEUS MIRABILIS NO ANAEROBES ISOLATED Performed at [facility], [place] [address], [place], [place] [address]    [place] 07/01/2021   LABORGA PROTEUS MIRABILIS 07/01/2021     Lab Results  Component Value Date    ALBUMIN 2.4 (L) 08/27/2021   ALBUMIN 2.5 (L) 08/25/2021   ALBUMIN 2.6 (L) 06/05/2021    Lab Results  Component Value Date   MG 2.2 08/25/2021   MG 2.2 05/19/2021   MG 2.0 12/23/2020   No results found for: VD25OH  No results found for: PREALBUMIN CBC EXTENDED Latest Ref Rng & Units 08/28/2021 08/27/2021 08/26/2021  WBC 4.0 - 10.5 K/uL 7.6 10.2 14.4(H)  RBC 4.22 - 5.81 MIL/uL 3.97(L) 3.98(L) 4.12(L)  HGB 13.0 - 17.0 g/dL 11.2(L) 11.2(L) 11.7(L)  HCT 39.0 - 52.0 % 33.9(L) 34.4(L) 36.2(L)  PLT 150 - 400 K/uL 313 329 300  NEUTROABS 1.7 - 7.7 K/uL - - -  LYMPHSABS 0.7 - 4.0 K/uL - - -     There is no height or weight on file to calculate BMI.  Orders:  No orders of the defined types were placed in this encounter.  No orders of the defined types were placed in this encounter.    Procedures: No procedures performed  Clinical Data: No additional findings.  ROS:  All other systems negative, except as noted in the HPI. Review of Systems  Objective: Vital Signs: There were no vitals taken for this visit.  Specialty Comments:  No specialty comments available.  PMFS History: Patient Active Problem List  Diagnosis Date Noted   Aortic atherosclerosis ([place]) 09/04/2021   Hypercholesterolemia 09/04/2021   Unspecified atrial fibrillation ([place]) 08/25/2021   Acute on chronic diastolic CHF (congestive heart failure) ([place]) 08/25/2021   Wound of right leg, sequela 07/01/2021   Nail, injury by 06/10/2021   Laceration of leg 05/20/2021   Acute respiratory failure with hypoxia ([place]) 05/18/2021   Chronic combined systolic and diastolic heart failure ([place]) 05/18/2021   Chronic kidney disease (CKD), stage IV (severe) ([place]) 05/18/2021   Hypertension associated with diabetes ([place]) 05/18/2021   Insulin dependent type 2 diabetes mellitus ([place]) 05/18/2021   OSA on CPAP 05/18/2021   Laceration of right lower extremity 05/18/2021   Current chronic use of systemic steroids 01/08/2021    Preoperative cardiovascular examination 01/08/2021   Cellulitis 12/23/2020   Benign neoplasm of duodenum, jejunum, 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   Chronic kidney disease, stage 4 (severe) ([place]) 12/03/2020   Degeneration of lumbar intervertebral disc 12/03/2020   Type 2 diabetes mellitus with stage 4 chronic kidney disease ([place]) 12/03/2020   Diabetic peripheral neuropathy associated with type 2 diabetes mellitus ([place]) 08/65/7846   Diastolic heart failure ([place]) 12/03/2020   Enlarged prostate 12/03/2020   Erectile dysfunction due to arterial insufficiency 12/03/2020   Essential hypertension 12/03/2020   Gastro-esophageal reflux disease without esophagitis 12/03/2020   Hematuria 12/03/2020   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   Morbid obesity ([place]) 12/03/2020   Osteoarthritis of hip 12/03/2020   Osteoarthritis of knee 12/03/2020   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/03/2020   Restless legs 12/03/2020   Skin sensation disturbance 12/03/2020   Lumbar stenosis 11/21/2020   Tendinitis 09/03/2020   Subacute arthropathy 09/03/2020   Bilateral hearing loss 08/11/2020   Impacted cerumen of left ear 03/10/2017   Excessive cerumen in left ear canal 03/10/2017   Faintness 12/09/2015   Backache 11/14/2014   CHRONIC PANCREATITIS 06/16/2009   Past Medical History:  Diagnosis Date   Arthritis    all joints   Chronic diastolic CHF (congestive heart failure) (HCC)    Chronic kidney disease (CKD), stage IV (severe) (HCC)    Chronic kidney disease, stage 3 (HCC)    Congestive heart failure with LV diastolic dysfunction, NYHA class 2 ([place])    Family history of adverse reaction to anesthesia    father- change in personaility   GERD (gastroesophageal reflux disease)    Hypertension    Hypertension associated with diabetes ([place])    Hypertensive chronic kidney disease    Insulin dependent type 2 diabetes mellitus ([place])    Neuropathy    OSA on CPAP     Sleep apnea    uses cpap, pt does not know settings    Family History  Problem Relation Age of Onset   Hypertension Mother    Anesthesia problems Neg Hx    Hypotension Neg Hx    Malignant hyperthermia Neg Hx    Pseudochol deficiency Neg Hx     Past Surgical History:  Procedure Laterality Date   ANTERIOR LAT LUMBAR FUSION Left 01/22/2021   Procedure: Lumbar one-Lumbar two Lateral Lumbar Interbody Fusion;  Surgeon: Vallarie Mare, MD;  Location: [facility];  Service: Neurosurgery;  Laterality: Left;   ANTERIOR LAT LUMBAR FUSION  01/22/2021   APPENDECTOMY     BACK SURGERY     4 surg, lower   CHOLECYSTECTOMY     COLONOSCOPY WITH PROPOFOL N/A 06/11/2014   Procedure: COLONOSCOPY WITH PROPOFOL;  Surgeon: Garlan Fair, MD;  [facility];  Service: Endoscopy;  Laterality: N/A;   ESOPHAGOGASTRODUODENOSCOPY  08/26/2011   Procedure: ESOPHAGOGASTRODUODENOSCOPY (EGD);  Surgeon: Garlan Fair, MD;  [facility];  Service: Endoscopy;  Laterality: N/A;   ESOPHAGOGASTRODUODENOSCOPY (EGD) WITH PROPOFOL N/A 06/11/2014   Procedure: ESOPHAGOGASTRODUODENOSCOPY (EGD) WITH PROPOFOL;  Surgeon: Garlan Fair, MD;  [facility];  Service: Endoscopy;  Laterality: N/A;   EUS  09/15/2011   Procedure: UPPER ENDOSCOPIC ULTRASOUND (EUS) LINEAR;  Surgeon: Landry Dyke, MD;  [facility];  Service: Endoscopy;  Laterality: N/A;  MAC   I & D EXTREMITY Right 05/20/2021   Procedure: IRRIGATION AND DEBRIDEMENT OF LEG AND APPLICATION OF SKIN GRAFT;  Surgeon: Newt Minion, MD;  Location: [facility];  Service: Orthopedics;  Laterality: Right;   I & D EXTREMITY Right 07/03/2021   Procedure: REPEAT RIGHT LEG DEBRIDEMENT;  Surgeon: Newt Minion, MD;  Location: [facility];  Service: Orthopedics;  Laterality: Right;   I & D EXTREMITY Right 07/01/2021   Procedure: RIGHT LEG DEBRIDEMENT;  Surgeon: Newt Minion, MD;  Location: [facility];  Service: Orthopedics;  Laterality: Right;   KNEE  ARTHROSCOPY Right YEARS AGO   Social History   Occupational History   Occupation: retired  Tobacco Use   Smoking status: Former    Packs/day: 3.00    Years: 20.00    Pack years: 60.00    Types: Cigarettes    Quit date: 1984    Years since quitting: 39.1   Smokeless tobacco: Never  Vaping Use   Vaping Use: Never used  Substance and Sexual Activity   Alcohol use: Not Currently   Drug use: Never   Sexual activity: Not on file

## 2021-09-22 NOTE — Progress Notes (Signed)
Office Visit Note   Patient: Robert Dickerson           Date of Birth: [birth year]           MRN: [id number] Visit Date: 09/22/2021              Requested by: Lavone Orn, MD [address] [place],  [place] [address] PCP: Lavone Orn, MD  Chief Complaint  Patient presents with   Right Leg - Wound Check      HPI: Patient is a 81 year old gentleman who is status post skin graft to ulcerations right leg.  Previous debridement December 2.  He is on doxycycline and Trental and has restarted his Lasix 80 mg a day.    The patient was in the office yesterday and had a compression wrap reapplied to his right lower extremity unfortunately this was removed again the patient reports this was very tight and uncomfortable he was unable to sleep due to discomfort  Assessment & Plan: Visit Diagnoses:  No diagnosis found.   Plan: We will resume the medical compression stocking and sleeve this was applied today.  Discussed his atopic dermatitis at length.  We have also sent in a prescription for clobetasol discussed using this at least twice a day and recommended follow-up with dermatology  Follow-Up Instructions: No follow-ups on file.   Ortho Exam  Patient is alert, oriented, no adenopathy, well-dressed, normal affect, normal respiratory effort. Examination there is decreased swelling there is some maceration around the wound from drainage there is approximately 50% fibrinous exudative tissue over the wound.  Unchanged from yesterday's examination  Imaging: No results found.     Labs: Lab Results  Component Value Date   HGBA1C 8.2 (H) 08/25/2021   HGBA1C 7.6 (H) 05/19/2021   HGBA1C 9.5 (H) 12/23/2020   LABURIC 6.4 03/04/2011   REPTSTATUS 07/06/2021 FINAL 07/01/2021   GRAMSTAIN NO WBC SEEN NO ORGANISMS SEEN  07/01/2021   CULT  07/01/2021    FEW STAPHYLOCOCCUS AUREUS RARE PROTEUS MIRABILIS NO ANAEROBES ISOLATED Performed at [facility], [place] [address], [place], [place] [address]    [place] 07/01/2021   LABORGA PROTEUS MIRABILIS 07/01/2021     Lab Results  Component Value Date   ALBUMIN 2.4 (L) 08/27/2021   ALBUMIN 2.5 (L) 08/25/2021   ALBUMIN 2.6 (L) 06/05/2021    Lab Results  Component Value Date   MG 2.2 08/25/2021   MG 2.2 05/19/2021   MG 2.0 12/23/2020   No results found for: VD25OH  No results found for: PREALBUMIN CBC EXTENDED Latest Ref Rng & Units 08/28/2021 08/27/2021 08/26/2021  WBC 4.0 - 10.5 K/uL 7.6 10.2 14.4(H)  RBC 4.22 - 5.81 MIL/uL 3.97(L) 3.98(L) 4.12(L)  HGB 13.0 - 17.0 g/dL 11.2(L) 11.2(L) 11.7(L)  HCT 39.0 - 52.0 % 33.9(L) 34.4(L) 36.2(L)  PLT 150 - 400 K/uL 313 329 300  NEUTROABS 1.7 - 7.7 K/uL - - -  LYMPHSABS 0.7 - 4.0 K/uL - - -     There is no height or weight on file to calculate BMI.  Orders:  No orders of the defined types were placed in this encounter.  Meds ordered this encounter  Medications   clobetasol cream (TEMOVATE) 0.05 %    Sig: Apply 1 application topically 2 (two) times daily.    Dispense:  400 g    Refill:  0    The jar please, is for all 4 extremities and trunk  Procedures: No procedures performed  Clinical Data: No additional findings.  ROS:  All other systems negative, except as noted in the HPI. Review of Systems  Objective: Vital Signs: There were no vitals taken for this visit.  Specialty Comments:  No specialty comments available.  PMFS History: Patient Active Problem List   Diagnosis Date Noted   Aortic atherosclerosis ([place]) 09/04/2021   Hypercholesterolemia 09/04/2021   Unspecified atrial fibrillation ([place]) 08/25/2021   Acute on chronic diastolic CHF (congestive heart failure) ([place]) 08/25/2021   Wound of right leg, sequela 07/01/2021   Nail, injury by 06/10/2021   Laceration of leg 05/20/2021   Acute respiratory failure with hypoxia ([place]) 05/18/2021   Chronic combined systolic and diastolic heart failure ([place])  05/18/2021   Chronic kidney disease (CKD), stage IV (severe) ([place]) 05/18/2021   Hypertension associated with diabetes ([place]) 05/18/2021   Insulin dependent type 2 diabetes mellitus ([place]) 05/18/2021   OSA on CPAP 05/18/2021   Laceration of right lower extremity 05/18/2021   Current chronic use of systemic steroids 01/08/2021   Preoperative cardiovascular examination 01/08/2021   Cellulitis 12/23/2020   Benign neoplasm of duodenum, jejunum, 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   Chronic kidney disease, stage 4 (severe) ([place]) 12/03/2020   Degeneration of lumbar intervertebral disc 12/03/2020   Type 2 diabetes mellitus with stage 4 chronic kidney disease ([place]) 12/03/2020   Diabetic peripheral neuropathy associated with type 2 diabetes mellitus ([place]) 70/35/0093   Diastolic heart failure ([place]) 12/03/2020   Enlarged prostate 12/03/2020   Erectile dysfunction due to arterial insufficiency 12/03/2020   Essential hypertension 12/03/2020   Gastro-esophageal reflux disease without esophagitis 12/03/2020   Hematuria 12/03/2020   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   Morbid obesity ([place]) 12/03/2020   Osteoarthritis of hip 12/03/2020   Osteoarthritis of knee 12/03/2020   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   Restless legs 12/03/2020   Skin sensation disturbance 12/03/2020   Lumbar stenosis 11/21/2020   Tendinitis 09/03/2020   Subacute arthropathy 09/03/2020   Bilateral hearing loss 08/11/2020   Impacted cerumen of left ear 03/10/2017   Excessive cerumen in left ear canal 03/10/2017   Faintness 12/09/2015   Backache 11/14/2014   CHRONIC PANCREATITIS 06/16/2009   Past Medical History:  Diagnosis Date   Arthritis    all joints   Chronic diastolic CHF (congestive heart failure) (HCC)    Chronic kidney disease (CKD), stage IV (severe) (HCC)    Chronic kidney disease, stage 3 (HCC)    Congestive heart failure with LV diastolic dysfunction, NYHA  class 2 ([place])    Family history of adverse reaction to anesthesia    father- change in personaility   GERD (gastroesophageal reflux disease)    Hypertension    Hypertension associated with diabetes ([place])    Hypertensive chronic kidney disease    Insulin dependent type 2 diabetes mellitus ([place])    Neuropathy    OSA on CPAP    Sleep apnea    uses cpap, pt does not know settings    Family History  Problem Relation Age of Onset   Hypertension Mother    Anesthesia problems Neg Hx    Hypotension Neg Hx    Malignant hyperthermia Neg Hx    Pseudochol deficiency Neg Hx     Past Surgical History:  Procedure Laterality Date   ANTERIOR LAT LUMBAR FUSION Left 01/22/2021   Procedure: Lumbar one-Lumbar two Lateral Lumbar Interbody Fusion;  Surgeon: Vallarie Mare, MD;  [facility];  Service: Neurosurgery;  Laterality: Left;   ANTERIOR LAT LUMBAR FUSION  01/22/2021   APPENDECTOMY     BACK SURGERY     4 surg, lower   CHOLECYSTECTOMY     COLONOSCOPY WITH PROPOFOL N/A 06/11/2014   Procedure: COLONOSCOPY WITH PROPOFOL;  Surgeon: Garlan Fair, MD;  [facility];  Service: Endoscopy;  Laterality: N/A;   ESOPHAGOGASTRODUODENOSCOPY  08/26/2011   Procedure: ESOPHAGOGASTRODUODENOSCOPY (EGD);  Surgeon: Garlan Fair, MD;  [facility];  Service: Endoscopy;  Laterality: N/A;   ESOPHAGOGASTRODUODENOSCOPY (EGD) WITH PROPOFOL N/A 06/11/2014   Procedure: ESOPHAGOGASTRODUODENOSCOPY (EGD) WITH PROPOFOL;  Surgeon: Garlan Fair, MD;  [facility];  Service: Endoscopy;  Laterality: N/A;   EUS  09/15/2011   Procedure: UPPER ENDOSCOPIC ULTRASOUND (EUS) LINEAR;  Surgeon: Landry Dyke, MD;  [facility];  Service: Endoscopy;  Laterality: N/A;  MAC   I & D EXTREMITY Right 05/20/2021   Procedure: IRRIGATION AND DEBRIDEMENT OF LEG AND APPLICATION OF SKIN GRAFT;  Surgeon: Newt Minion, MD;  Location: [facility];  Service: Orthopedics;  Laterality: Right;   I & D  EXTREMITY Right 07/03/2021   Procedure: REPEAT RIGHT LEG DEBRIDEMENT;  Surgeon: Newt Minion, MD;  Location: [facility];  Service: Orthopedics;  Laterality: Right;   I & D EXTREMITY Right 07/01/2021   Procedure: RIGHT LEG DEBRIDEMENT;  Surgeon: Newt Minion, MD;  Location: [facility];  Service: Orthopedics;  Laterality: Right;   KNEE ARTHROSCOPY Right YEARS AGO   Social History   Occupational History   Occupation: retired  Tobacco Use   Smoking status: Former    Packs/day: 3.00    Years: 20.00    Pack years: 60.00    Types: Cigarettes    Quit date: 1984    Years since quitting: 39.1   Smokeless tobacco: Never  Vaping Use   Vaping Use: Never used  Substance and Sexual Activity   Alcohol use: Not Currently   Drug use: Never   Sexual activity: Not on file

## 2021-09-24 ENCOUNTER — Telehealth: Payer: Self-pay | Admitting: Cardiovascular Disease

## 2021-09-24 ENCOUNTER — Telehealth: Payer: Self-pay | Admitting: Orthopedic Surgery

## 2021-09-24 DIAGNOSIS — N184 Chronic kidney disease, stage 4 (severe): Secondary | ICD-10-CM | POA: Diagnosis not present

## 2021-09-24 MED ORDER — METOPROLOL TARTRATE 25 MG PO TABS: 25.0000 mg | ORAL_TABLET | Freq: Two times a day (BID) | ORAL | 3 refills | Status: DC

## 2021-09-24 MED ORDER — ISOSORBIDE DINITRATE 10 MG PO TABS: 10.0000 mg | ORAL_TABLET | Freq: Three times a day (TID) | ORAL | 0 refills | Status: DC

## 2021-09-24 MED ORDER — METOPROLOL TARTRATE 25 MG PO TABS: 25.0000 mg | ORAL_TABLET | Freq: Two times a day (BID) | ORAL | 0 refills | Status: DC

## 2021-09-24 MED ORDER — HYDRALAZINE HCL 10 MG PO TABS: 10.0000 mg | ORAL_TABLET | Freq: Three times a day (TID) | ORAL | 3 refills | Status: DC

## 2021-09-24 MED ORDER — HYDRALAZINE HCL 10 MG PO TABS: 10.0000 mg | ORAL_TABLET | Freq: Three times a day (TID) | ORAL | 0 refills | Status: DC

## 2021-09-24 MED ORDER — ATORVASTATIN CALCIUM 20 MG PO TABS: 20.0000 mg | ORAL_TABLET | Freq: Every day | ORAL | 0 refills | Status: DC

## 2021-09-24 MED ORDER — ATORVASTATIN CALCIUM 20 MG PO TABS: 20.0000 mg | ORAL_TABLET | Freq: Every day | ORAL | 3 refills | Status: DC

## 2021-09-24 MED ORDER — ISOSORBIDE DINITRATE 10 MG PO TABS: 10.0000 mg | ORAL_TABLET | Freq: Three times a day (TID) | ORAL | 3 refills | Status: DC

## 2021-09-24 NOTE — Telephone Encounter (Signed)
Spoke with pt regarding needing refills for his cardiac medications. Pt states that he is supposed to be receiving refills from mail order pharmacy [facility] but he has not received them and he only has 2 days worth of medication left. Per pt's chart it does not appear that these refills have been sent in. Explained to pt that I would send prescriptions in to [facility] and then send a short supply to his local pharmacy. Explained to pt that we do not have samples of these medications in the office. Pt is agreeable to this plan. Sent in prescriptions to [facility] and then also sent a 15 day supply of each medication to [facility] in [place]. Pt verbalizes understanding.

## 2021-09-24 NOTE — Telephone Encounter (Signed)
I called and sw pt's wife and she states that she was advised by Junie Panning to apply the cream to the patient that was given yesterday 4 x a day. Advised rx for clobetasol says to apply twice a day and this is also noted in the dictation from yesterdays visit and to follow up with dermatology. Pt's wife voiced understanding and will call with any questions.

## 2021-09-24 NOTE — Telephone Encounter (Signed)
Patient calling the office for samples of medication:   1.  What medication and dosage are you requesting samples for?   hydrALAZINE (APRESOLINE) 10 MG tablet  atorvastatin (LIPITOR) 20 MG tablet  isosorbide dinitrate (ISORDIL) 10 MG tablet  metoprolol tartrate (LOPRESSOR) 25 MG tablet  2.  Are you currently out of this medication?  Has enough of all the above for two more days.

## 2021-09-24 NOTE — Telephone Encounter (Signed)
Patients wife called in with some questions regarding the medication that was prescribed to her husband. Please advise.

## 2021-09-25 DIAGNOSIS — I5033 Acute on chronic diastolic (congestive) heart failure: Secondary | ICD-10-CM | POA: Diagnosis not present

## 2021-09-25 DIAGNOSIS — N184 Chronic kidney disease, stage 4 (severe): Secondary | ICD-10-CM | POA: Diagnosis not present

## 2021-09-25 DIAGNOSIS — E1165 Type 2 diabetes mellitus with hyperglycemia: Secondary | ICD-10-CM | POA: Diagnosis not present

## 2021-09-25 DIAGNOSIS — N179 Acute kidney failure, unspecified: Secondary | ICD-10-CM | POA: Diagnosis not present

## 2021-09-25 DIAGNOSIS — E1122 Type 2 diabetes mellitus with diabetic chronic kidney disease: Secondary | ICD-10-CM | POA: Diagnosis not present

## 2021-09-25 DIAGNOSIS — I130 Hypertensive heart and chronic kidney disease with heart failure and stage 1 through stage 4 chronic kidney disease, or unspecified chronic kidney disease: Secondary | ICD-10-CM | POA: Diagnosis not present

## 2021-09-28 ENCOUNTER — Ambulatory Visit: Payer: Medicare Other

## 2021-09-29 ENCOUNTER — Other Ambulatory Visit: Payer: Self-pay

## 2021-09-29 ENCOUNTER — Telehealth: Payer: Self-pay | Admitting: Cardiovascular Disease

## 2021-09-29 DIAGNOSIS — Z6831 Body mass index (BMI) 31.0-31.9, adult: Secondary | ICD-10-CM | POA: Diagnosis not present

## 2021-09-29 DIAGNOSIS — I130 Hypertensive heart and chronic kidney disease with heart failure and stage 1 through stage 4 chronic kidney disease, or unspecified chronic kidney disease: Secondary | ICD-10-CM | POA: Diagnosis not present

## 2021-09-29 DIAGNOSIS — Z794 Long term (current) use of insulin: Secondary | ICD-10-CM | POA: Diagnosis not present

## 2021-09-29 DIAGNOSIS — K219 Gastro-esophageal reflux disease without esophagitis: Secondary | ICD-10-CM | POA: Diagnosis not present

## 2021-09-29 DIAGNOSIS — Z7952 Long term (current) use of systemic steroids: Secondary | ICD-10-CM | POA: Diagnosis not present

## 2021-09-29 DIAGNOSIS — E1165 Type 2 diabetes mellitus with hyperglycemia: Secondary | ICD-10-CM | POA: Diagnosis not present

## 2021-09-29 DIAGNOSIS — G4733 Obstructive sleep apnea (adult) (pediatric): Secondary | ICD-10-CM | POA: Diagnosis not present

## 2021-09-29 DIAGNOSIS — Z9181 History of falling: Secondary | ICD-10-CM | POA: Diagnosis not present

## 2021-09-29 DIAGNOSIS — I5033 Acute on chronic diastolic (congestive) heart failure: Secondary | ICD-10-CM | POA: Diagnosis not present

## 2021-09-29 DIAGNOSIS — M353 Polymyalgia rheumatica: Secondary | ICD-10-CM | POA: Diagnosis not present

## 2021-09-29 DIAGNOSIS — Z945 Skin transplant status: Secondary | ICD-10-CM | POA: Diagnosis not present

## 2021-09-29 DIAGNOSIS — N184 Chronic kidney disease, stage 4 (severe): Secondary | ICD-10-CM | POA: Diagnosis not present

## 2021-09-29 DIAGNOSIS — S81801D Unspecified open wound, right lower leg, subsequent encounter: Secondary | ICD-10-CM | POA: Diagnosis not present

## 2021-09-29 DIAGNOSIS — E1122 Type 2 diabetes mellitus with diabetic chronic kidney disease: Secondary | ICD-10-CM | POA: Diagnosis not present

## 2021-09-29 DIAGNOSIS — N179 Acute kidney failure, unspecified: Secondary | ICD-10-CM | POA: Diagnosis not present

## 2021-09-29 DIAGNOSIS — Z9981 Dependence on supplemental oxygen: Secondary | ICD-10-CM | POA: Diagnosis not present

## 2021-09-29 MED ORDER — METOPROLOL TARTRATE 25 MG PO TABS: 25.0000 mg | ORAL_TABLET | Freq: Two times a day (BID) | ORAL | 1 refills | Status: DC

## 2021-09-29 MED ORDER — ATORVASTATIN CALCIUM 20 MG PO TABS: 20.0000 mg | ORAL_TABLET | Freq: Every day | ORAL | 1 refills | Status: DC

## 2021-09-29 MED ORDER — HYDRALAZINE HCL 10 MG PO TABS: 10.0000 mg | ORAL_TABLET | Freq: Three times a day (TID) | ORAL | 3 refills | Status: DC

## 2021-09-29 NOTE — Telephone Encounter (Signed)
Called patient, spoke with wife- advised a 15 day supply was sent to local pharmacy on 02/23- she states [facility] not have theres ready until 03/06- I did advise that patient should have  medication and enough to last until his supply from mail order comes in. I [facility] so they could have it on file-   Patient wife verbalized understanding, thankful for call.

## 2021-09-29 NOTE — Telephone Encounter (Signed)
° ° °  Pt c/o medication issue:  1. Name of Medication:  hydrALAZINE (APRESOLINE) 10 MG tablet atorvastatin (LIPITOR) 20 MG tablet metoprolol tartrate (LOPRESSOR) 25 MG tablet  2. How are you currently taking this medication (dosage and times per day)?   3. Are you having a reaction (difficulty breathing--STAT)?   4. What is your medication issue? Pt's wife calling back, she said she received a notification from [facility] that these meds will not be ready to fill till 10/05/21 and pt will be running our of meds before that

## 2021-10-02 DIAGNOSIS — I130 Hypertensive heart and chronic kidney disease with heart failure and stage 1 through stage 4 chronic kidney disease, or unspecified chronic kidney disease: Secondary | ICD-10-CM | POA: Diagnosis not present

## 2021-10-02 DIAGNOSIS — E1122 Type 2 diabetes mellitus with diabetic chronic kidney disease: Secondary | ICD-10-CM | POA: Diagnosis not present

## 2021-10-02 DIAGNOSIS — N179 Acute kidney failure, unspecified: Secondary | ICD-10-CM | POA: Diagnosis not present

## 2021-10-02 DIAGNOSIS — N184 Chronic kidney disease, stage 4 (severe): Secondary | ICD-10-CM | POA: Diagnosis not present

## 2021-10-02 DIAGNOSIS — I5033 Acute on chronic diastolic (congestive) heart failure: Secondary | ICD-10-CM | POA: Diagnosis not present

## 2021-10-02 DIAGNOSIS — E1165 Type 2 diabetes mellitus with hyperglycemia: Secondary | ICD-10-CM | POA: Diagnosis not present

## 2021-10-05 ENCOUNTER — Encounter: Payer: Medicare Other | Admitting: Orthopedic Surgery

## 2021-10-07 DIAGNOSIS — N184 Chronic kidney disease, stage 4 (severe): Secondary | ICD-10-CM | POA: Diagnosis not present

## 2021-10-07 DIAGNOSIS — E1165 Type 2 diabetes mellitus with hyperglycemia: Secondary | ICD-10-CM | POA: Diagnosis not present

## 2021-10-07 DIAGNOSIS — I130 Hypertensive heart and chronic kidney disease with heart failure and stage 1 through stage 4 chronic kidney disease, or unspecified chronic kidney disease: Secondary | ICD-10-CM | POA: Diagnosis not present

## 2021-10-07 DIAGNOSIS — N179 Acute kidney failure, unspecified: Secondary | ICD-10-CM | POA: Diagnosis not present

## 2021-10-07 DIAGNOSIS — I5033 Acute on chronic diastolic (congestive) heart failure: Secondary | ICD-10-CM | POA: Diagnosis not present

## 2021-10-07 DIAGNOSIS — E1122 Type 2 diabetes mellitus with diabetic chronic kidney disease: Secondary | ICD-10-CM | POA: Diagnosis not present

## 2021-10-08 ENCOUNTER — Ambulatory Visit (INDEPENDENT_AMBULATORY_CARE_PROVIDER_SITE_OTHER): Payer: Medicare Other | Admitting: Orthopedic Surgery

## 2021-10-08 DIAGNOSIS — Z945 Skin transplant status: Secondary | ICD-10-CM | POA: Diagnosis not present

## 2021-10-11 ENCOUNTER — Encounter: Payer: Self-pay | Admitting: Orthopedic Surgery

## 2021-10-11 NOTE — Progress Notes (Signed)
Office Visit Note   Patient: Robert Dickerson           Date of Birth: [birth year]           MRN: [id number] Visit Date: 10/08/2021              Requested by: Lavone Orn, MD [address] [place],  [place] [address] PCP: Lavone Orn, MD  Chief Complaint  Patient presents with   Right Leg - Follow-up    07/03/21 RLE deb and Kerecis graft       HPI: Patient is a 81 year old gentleman who presents in follow-up for right leg status post skin graft anterior medially and anterior laterally.  Patient still has increased swelling with pain and pitting edema.  Assessment & Plan: Visit Diagnoses:  1. History of skin graft     Plan: Patient states he does not want to proceed with the compression wrap.  Recommend he continue with his compression sock recommended protein supplements exercise and elevation.  Follow-Up Instructions: Return in about 4 weeks (around 11/05/2021).   Ortho Exam  Patient is alert, oriented, no adenopathy, well-dressed, normal affect, normal respiratory effort. Examination the ulcer over the dorsum of the ankle is healed the lateral leg ulcer is healed the medial ulcer is smaller and flat.  No cellulitis there is chronic brawny skin color changes.  Imaging: No results found.    Labs: Lab Results  Component Value Date   HGBA1C 8.2 (H) 08/25/2021   HGBA1C 7.6 (H) 05/19/2021   HGBA1C 9.5 (H) 12/23/2020   LABURIC 6.4 03/04/2011   REPTSTATUS 07/06/2021 FINAL 07/01/2021   GRAMSTAIN NO WBC SEEN NO ORGANISMS SEEN  07/01/2021   CULT  07/01/2021    FEW STAPHYLOCOCCUS AUREUS RARE PROTEUS MIRABILIS NO ANAEROBES ISOLATED Performed at [facility], [place] [address] [place], [place] [address]    [place] 07/01/2021   LABORGA PROTEUS MIRABILIS 07/01/2021     Lab Results  Component Value Date   ALBUMIN 2.4 (L) 08/27/2021   ALBUMIN 2.5 (L) 08/25/2021   ALBUMIN 2.6 (L) 06/05/2021    Lab Results  Component Value  Date   MG 2.2 08/25/2021   MG 2.2 05/19/2021   MG 2.0 12/23/2020   No results found for: VD25OH  No results found for: PREALBUMIN CBC EXTENDED Latest Ref Rng & Units 08/28/2021 08/27/2021 08/26/2021  WBC 4.0 - 10.5 K/uL 7.6 10.2 14.4(H)  RBC 4.22 - 5.81 MIL/uL 3.97(L) 3.98(L) 4.12(L)  HGB 13.0 - 17.0 g/dL 11.2(L) 11.2(L) 11.7(L)  HCT 39.0 - 52.0 % 33.9(L) 34.4(L) 36.2(L)  PLT 150 - 400 K/uL 313 329 300  NEUTROABS 1.7 - 7.7 K/uL - - -  LYMPHSABS 0.7 - 4.0 K/uL - - -     There is no height or weight on file to calculate BMI.  Orders:  No orders of the defined types were placed in this encounter.  No orders of the defined types were placed in this encounter.    Procedures: No procedures performed  Clinical Data: No additional findings.  ROS:  All other systems negative, except as noted in the HPI. Review of Systems  Objective: Vital Signs: There were no vitals taken for this visit.  Specialty Comments:  No specialty comments available.  PMFS History: Patient Active Problem List   Diagnosis Date Noted   Aortic atherosclerosis ([place]) 09/04/2021   Hypercholesterolemia 09/04/2021   Unspecified atrial fibrillation ([place]) 08/25/2021   Acute on chronic diastolic CHF (congestive  heart failure) ([place]) 08/25/2021   Wound of right leg, sequela 07/01/2021   Nail, injury by 06/10/2021   Laceration of leg 05/20/2021   Acute respiratory failure with hypoxia ([place]) 05/18/2021   Chronic combined systolic and diastolic heart failure ([place]) 05/18/2021   Chronic kidney disease (CKD), stage IV (severe) (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/03/2020   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   Chronic kidney disease, stage 4 (severe) ([place]) 12/03/2020   Degeneration of lumbar intervertebral disc 12/03/2020   Type 2 diabetes mellitus with stage 4 chronic kidney disease ([place]) 12/03/2020   Diabetic peripheral neuropathy associated with type 2 diabetes mellitus ([place]) 93/81/8299   Diastolic heart failure ([place]) 12/03/2020   Enlarged prostate 12/03/2020   Erectile dysfunction due to arterial insufficiency 12/03/2020   Essential hypertension 12/03/2020   Gastro-esophageal reflux disease without esophagitis 12/03/2020   Hematuria 12/03/2020   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   Morbid obesity ([place]) 12/03/2020   Osteoarthritis of hip 12/03/2020   Osteoarthritis of knee 12/03/2020   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/2020   Restless legs 12/03/2020   Skin sensation disturbance 12/03/2020   Lumbar stenosis 11/21/2020   Tendinitis 09/03/2020   Subacute arthropathy 09/03/2020   Bilateral hearing loss 08/11/2020   Impacted cerumen of left ear 03/10/2017   Excessive cerumen in left ear canal 03/10/2017   Faintness 12/09/2015   Backache 11/14/2014   CHRONIC PANCREATITIS 06/16/2009   Past Medical History:  Diagnosis Date   Arthritis    all joints   Chronic diastolic CHF (congestive heart failure) (HCC)    Chronic kidney disease (CKD), stage IV (severe) (HCC)    Chronic kidney disease, stage 3 (HCC)    Congestive heart failure with LV diastolic dysfunction, NYHA class 2 ([place])    Family history of adverse reaction to anesthesia    father- change in personaility   GERD (gastroesophageal reflux disease)    Hypertension    Hypertension associated with diabetes ([place])    Hypertensive chronic kidney disease    Insulin dependent type 2 diabetes mellitus ([place])    Neuropathy    OSA on CPAP    Sleep apnea    uses cpap, pt does not know settings    Family History  Problem Relation Age of Onset   Hypertension  Mother    Anesthesia problems Neg Hx    Hypotension Neg Hx    Malignant hyperthermia Neg Hx    Pseudochol deficiency Neg Hx     Past Surgical History:  Procedure Laterality Date   ANTERIOR LAT LUMBAR FUSION Left 01/22/2021   Procedure: Lumbar one-Lumbar two Lateral Lumbar Interbody Fusion;  Surgeon: Vallarie Mare, MD;  Location: [facility];  Service: Neurosurgery;  Laterality: Left;   ANTERIOR LAT LUMBAR FUSION  01/22/2021   APPENDECTOMY     BACK SURGERY     4 surg, lower   CHOLECYSTECTOMY     COLONOSCOPY WITH PROPOFOL N/A 06/11/2014   Procedure: COLONOSCOPY WITH PROPOFOL;  Surgeon: Garlan Fair, MD;  [facility];  Service: Endoscopy;  Laterality: N/A;   ESOPHAGOGASTRODUODENOSCOPY  08/26/2011   Procedure: ESOPHAGOGASTRODUODENOSCOPY (EGD);  Surgeon: Garlan Fair, MD;  [facility];  Service: Endoscopy;  Laterality: N/A;   ESOPHAGOGASTRODUODENOSCOPY (EGD) WITH PROPOFOL N/A 06/11/2014   Procedure: ESOPHAGOGASTRODUODENOSCOPY (EGD) WITH PROPOFOL;  Surgeon: Garlan Fair, MD;  [facility];  Service: Endoscopy;  Laterality: N/A;   EUS  09/15/2011   Procedure: UPPER ENDOSCOPIC ULTRASOUND (EUS) LINEAR;  Surgeon: Landry Dyke, MD;  [facility];  Service: Endoscopy;  Laterality: N/A;  MAC   I & D EXTREMITY Right 05/20/2021   Procedure: IRRIGATION AND DEBRIDEMENT OF LEG AND APPLICATION OF SKIN GRAFT;  Surgeon: Newt Minion, MD;  Location: [facility];  Service: Orthopedics;  Laterality: Right;   I & D EXTREMITY Right 07/03/2021   Procedure: REPEAT RIGHT LEG DEBRIDEMENT;  Surgeon: Newt Minion, MD;  Location: [facility];  Service: Orthopedics;  Laterality: Right;   I & D EXTREMITY Right 07/01/2021   Procedure: RIGHT LEG DEBRIDEMENT;  Surgeon: Newt Minion, MD;  Location: [facility];  Service: Orthopedics;  Laterality: Right;   KNEE ARTHROSCOPY Right YEARS AGO   Social History   Occupational History   Occupation: retired  Tobacco Use   Smoking status:  Former    Packs/day: 3.00    Years: 20.00    Pack years: 60.00    Types: Cigarettes    Quit date: 1984    Years since quitting: 39.2   Smokeless tobacco: Never  Vaping Use   Vaping Use: Never used  Substance and Sexual Activity   Alcohol use: Not Currently   Drug use: Never   Sexual activity: Not on file

## 2021-10-14 DIAGNOSIS — N179 Acute kidney failure, unspecified: Secondary | ICD-10-CM | POA: Diagnosis not present

## 2021-10-14 DIAGNOSIS — E1122 Type 2 diabetes mellitus with diabetic chronic kidney disease: Secondary | ICD-10-CM | POA: Diagnosis not present

## 2021-10-14 DIAGNOSIS — N184 Chronic kidney disease, stage 4 (severe): Secondary | ICD-10-CM | POA: Diagnosis not present

## 2021-10-14 DIAGNOSIS — I5033 Acute on chronic diastolic (congestive) heart failure: Secondary | ICD-10-CM | POA: Diagnosis not present

## 2021-10-14 DIAGNOSIS — E1165 Type 2 diabetes mellitus with hyperglycemia: Secondary | ICD-10-CM | POA: Diagnosis not present

## 2021-10-14 DIAGNOSIS — I130 Hypertensive heart and chronic kidney disease with heart failure and stage 1 through stage 4 chronic kidney disease, or unspecified chronic kidney disease: Secondary | ICD-10-CM | POA: Diagnosis not present

## 2021-10-16 ENCOUNTER — Other Ambulatory Visit: Payer: Self-pay | Admitting: Orthopedic Surgery

## 2021-10-16 ENCOUNTER — Telehealth: Payer: Self-pay | Admitting: Orthopedic Surgery

## 2021-10-16 MED ORDER — PENTOXIFYLLINE ER 400 MG PO TBCR: 400.0000 mg | EXTENDED_RELEASE_TABLET | Freq: Three times a day (TID) | ORAL | 3 refills | Status: DC

## 2021-10-16 NOTE — Telephone Encounter (Signed)
Pt's wife called requesting to pt's med pentoxifylline be sent to [facility] today. Other pharmacy is out. Pt phone number is [phone] ?

## 2021-10-16 NOTE — Telephone Encounter (Signed)
Needs refill of trental sent to mail order pharmacy, [facility]. ?

## 2021-10-20 ENCOUNTER — Other Ambulatory Visit: Payer: Self-pay | Admitting: Family

## 2021-10-20 MED ORDER — PENTOXIFYLLINE ER 400 MG PO TBCR: 400.0000 mg | EXTENDED_RELEASE_TABLET | Freq: Three times a day (TID) | ORAL | 3 refills | Status: DC

## 2021-10-21 DIAGNOSIS — E1165 Type 2 diabetes mellitus with hyperglycemia: Secondary | ICD-10-CM | POA: Diagnosis not present

## 2021-10-21 DIAGNOSIS — I130 Hypertensive heart and chronic kidney disease with heart failure and stage 1 through stage 4 chronic kidney disease, or unspecified chronic kidney disease: Secondary | ICD-10-CM | POA: Diagnosis not present

## 2021-10-21 DIAGNOSIS — N184 Chronic kidney disease, stage 4 (severe): Secondary | ICD-10-CM | POA: Diagnosis not present

## 2021-10-21 DIAGNOSIS — E1122 Type 2 diabetes mellitus with diabetic chronic kidney disease: Secondary | ICD-10-CM | POA: Diagnosis not present

## 2021-10-21 DIAGNOSIS — I5033 Acute on chronic diastolic (congestive) heart failure: Secondary | ICD-10-CM | POA: Diagnosis not present

## 2021-10-21 DIAGNOSIS — N179 Acute kidney failure, unspecified: Secondary | ICD-10-CM | POA: Diagnosis not present

## 2021-10-22 ENCOUNTER — Other Ambulatory Visit: Payer: Self-pay | Admitting: Family

## 2021-10-23 DIAGNOSIS — I5042 Chronic combined systolic (congestive) and diastolic (congestive) heart failure: Secondary | ICD-10-CM | POA: Diagnosis not present

## 2021-10-23 DIAGNOSIS — E0869 Diabetes mellitus due to underlying condition with other specified complication: Secondary | ICD-10-CM | POA: Diagnosis not present

## 2021-10-23 DIAGNOSIS — I10 Essential (primary) hypertension: Secondary | ICD-10-CM | POA: Diagnosis not present

## 2021-10-27 ENCOUNTER — Other Ambulatory Visit: Payer: Self-pay

## 2021-10-27 ENCOUNTER — Ambulatory Visit (INDEPENDENT_AMBULATORY_CARE_PROVIDER_SITE_OTHER): Payer: Medicare Other | Admitting: Orthopedic Surgery

## 2021-10-27 DIAGNOSIS — Z945 Skin transplant status: Secondary | ICD-10-CM

## 2021-10-27 MED ORDER — SULFAMETHOXAZOLE-TRIMETHOPRIM 800-160 MG PO TABS: 1.0000 | ORAL_TABLET | Freq: Two times a day (BID) | ORAL | 0 refills | Status: DC

## 2021-10-28 DIAGNOSIS — E1122 Type 2 diabetes mellitus with diabetic chronic kidney disease: Secondary | ICD-10-CM | POA: Diagnosis not present

## 2021-10-28 DIAGNOSIS — I5033 Acute on chronic diastolic (congestive) heart failure: Secondary | ICD-10-CM | POA: Diagnosis not present

## 2021-10-28 DIAGNOSIS — E1165 Type 2 diabetes mellitus with hyperglycemia: Secondary | ICD-10-CM | POA: Diagnosis not present

## 2021-10-28 DIAGNOSIS — I130 Hypertensive heart and chronic kidney disease with heart failure and stage 1 through stage 4 chronic kidney disease, or unspecified chronic kidney disease: Secondary | ICD-10-CM | POA: Diagnosis not present

## 2021-10-28 DIAGNOSIS — N184 Chronic kidney disease, stage 4 (severe): Secondary | ICD-10-CM | POA: Diagnosis not present

## 2021-10-28 DIAGNOSIS — N179 Acute kidney failure, unspecified: Secondary | ICD-10-CM | POA: Diagnosis not present

## 2021-11-02 DIAGNOSIS — I129 Hypertensive chronic kidney disease with stage 1 through stage 4 chronic kidney disease, or unspecified chronic kidney disease: Secondary | ICD-10-CM | POA: Diagnosis not present

## 2021-11-02 DIAGNOSIS — I5020 Unspecified systolic (congestive) heart failure: Secondary | ICD-10-CM | POA: Diagnosis not present

## 2021-11-02 DIAGNOSIS — D631 Anemia in chronic kidney disease: Secondary | ICD-10-CM | POA: Diagnosis not present

## 2021-11-02 DIAGNOSIS — N184 Chronic kidney disease, stage 4 (severe): Secondary | ICD-10-CM | POA: Diagnosis not present

## 2021-11-02 DIAGNOSIS — E1122 Type 2 diabetes mellitus with diabetic chronic kidney disease: Secondary | ICD-10-CM | POA: Diagnosis not present

## 2021-11-02 DIAGNOSIS — N2581 Secondary hyperparathyroidism of renal origin: Secondary | ICD-10-CM | POA: Diagnosis not present

## 2021-11-02 DIAGNOSIS — L97919 Non-pressure chronic ulcer of unspecified part of right lower leg with unspecified severity: Secondary | ICD-10-CM | POA: Diagnosis not present

## 2021-11-03 ENCOUNTER — Encounter: Payer: Self-pay | Admitting: Orthopedic Surgery

## 2021-11-03 NOTE — Progress Notes (Signed)
? ?Office Visit Note ?  ?Patient: Robert Dickerson           ?Date of Birth: [birth year]           ?MRN: [id number] ?Visit Date: 10/27/2021 ?             ?Requested by: Lavone Orn, MD ?[place] ?[address] ?[place],  [place] [address] ?PCP: Lavone Orn, MD ? ?Chief Complaint  ?Patient presents with  ? Right Leg - Wound Check  ?  07/03/21 RLE debridement and kerecis graft  ? ? ? ? ?HPI: ?Patient is a 81 year old gentleman who presents status post right lower extremity debridement and application of Kerecis tissue graft.  He is currently in compression stockings. ? ?Assessment & Plan: ?Visit Diagnoses:  ?1. History of skin graft   ? ? ?Plan: We will call in a prescription for Bactrim DS.  Continue with the compression stocking. ? ?Follow-Up Instructions: Return in about 2 weeks (around 11/10/2021).  ? ?Ortho Exam ? ?Patient is alert, oriented, no adenopathy, well-dressed, normal affect, normal respiratory effort. ?Examination there is no drainage there is mild fibrinous exudative tissue there is brawny edema.  The wound is 65 x 60 mm and 1 mm deep. ? ?Imaging: ?No results found. ? ? ? ?Labs: ?Lab Results  ?Component Value Date  ? HGBA1C 8.2 (H) 08/25/2021  ? HGBA1C 7.6 (H) 05/19/2021  ? HGBA1C 9.5 (H) 12/23/2020  ? LABURIC 6.4 03/04/2011  ? REPTSTATUS 07/06/2021 FINAL 07/01/2021  ? GRAMSTAIN NO WBC SEEN ?NO ORGANISMS SEEN ? 07/01/2021  ? CULT  07/01/2021  ?  FEW STAPHYLOCOCCUS AUREUS ?RARE PROTEUS MIRABILIS ?NO ANAEROBES ISOLATED ?Performed at [facility], [place] [address], [place], [place] [address] ?  ? [place] STAPHYLOCOCCUS AUREUS 07/01/2021  ? [place] 07/01/2021  ? ? ? ?Lab Results  ?Component Value Date  ? ALBUMIN 2.4 (L) 08/27/2021  ? ALBUMIN 2.5 (L) 08/25/2021  ? ALBUMIN 2.6 (L) 06/05/2021  ? ? ?Lab Results  ?Component Value Date  ? MG 2.2 08/25/2021  ? MG 2.2 05/19/2021  ? MG 2.0 12/23/2020  ? ?No results found for: VD25OH ? ?No results found for: PREALBUMIN ? ?  Latest Ref Rng &  Units 08/28/2021  ?  2:58 AM 08/27/2021  ?  4:26 AM 08/26/2021  ?  6:15 AM  ?CBC EXTENDED  ?WBC 4.0 - 10.5 K/uL 7.6   10.2   14.4    ?RBC 4.22 - 5.81 MIL/uL 3.97   3.98   4.12    ?Hemoglobin 13.0 - 17.0 g/dL 11.2   11.2   11.7    ?HCT 39.0 - 52.0 % 33.9   34.4   36.2    ?Platelets 150 - 400 K/uL 313   329   300    ? ? ? ?There is no height or weight on file to calculate BMI. ? ?Orders:  ?No orders of the defined types were placed in this encounter. ? ?Meds ordered this encounter  ?Medications  ? sulfamethoxazole-trimethoprim (BACTRIM DS) 800-160 MG tablet  ?  Sig: Take 1 tablet by mouth 2 (two) times daily.  ?  Dispense:  40 tablet  ?  Refill:  0  ? ? ? Procedures: ?No procedures performed ? ?Clinical Data: ?No additional findings. ? ?ROS: ? ?All other systems negative, except as noted in the HPI. ?Review of Systems ? ?Objective: ?Vital Signs: There were no vitals taken for this visit. ? ?Specialty Comments:  ?No specialty comments available. ? ?  PMFS History: ?Patient Active Problem List  ? Diagnosis Date Noted  ? Aortic atherosclerosis ([place]) 09/04/2021  ? Hypercholesterolemia 09/04/2021  ? Unspecified atrial fibrillation ([place]) 08/25/2021  ? Acute on chronic diastolic CHF (congestive heart failure) ([place]) 08/25/2021  ? Wound of right leg, sequela 07/01/2021  ? Nail, injury by 06/10/2021  ? Laceration of leg 05/20/2021  ? Acute respiratory failure with hypoxia ([place]) 05/18/2021  ? Chronic combined systolic and diastolic heart failure ([place]) 05/18/2021  ? Chronic kidney disease (CKD), stage IV (severe) ([place]) 05/18/2021  ? Hypertension associated with diabetes ([place]) 05/18/2021  ? Insulin dependent type 2 diabetes mellitus ([place]) 05/18/2021  ? OSA on CPAP 05/18/2021  ? Laceration of right lower extremity 05/18/2021  ? Current chronic use of systemic steroids 01/08/2021  ? Preoperative cardiovascular examination 01/08/2021  ? Cellulitis 12/23/2020  ? Benign neoplasm of duodenum, jejunum, 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  ? Chronic gouty  arthritis 12/03/2020  ? Chronic kidney disease, stage 4 (severe) ([place]) 12/03/2020  ? Degeneration of lumbar intervertebral disc 12/03/2020  ? Type 2 diabetes mellitus with stage 4 chronic kidney disease ([place]) 12/03/2020  ? Diabetic peripheral neuropathy associated with type 2 diabetes mellitus ([place]) 12/03/2020  ? Diastolic heart failure ([place]) 12/03/2020  ? Enlarged prostate 12/03/2020  ? Erectile dysfunction due to arterial insufficiency 12/03/2020  ? Essential hypertension 12/03/2020  ? Gastro-esophageal reflux disease without esophagitis 12/03/2020  ? Hematuria 12/03/2020  ? 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 Morbid obesity ([facility]) 12/03/2020  ? 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 Restless legs 12/03/2020  ? Skin sensation disturbance 12/03/2020  ? Lumbar stenosis 11/21/2020  ? Tendinitis 09/03/2020  ? Subacute arthropathy 09/03/2020  ? Bilateral hearing loss 08/11/2020  ? Impacted cerumen of left ear 03/10/2017  ? Excessive cerumen in left ear canal 03/10/2017  ? Faintness 12/09/2015  ? Backache 11/14/2014  ? CHRONIC PANCREATITIS 06/16/2009  ? ?Past Medical History:  ?Diagnosis Date  ? Arthritis   ? all joints  ? Chronic diastolic CHF (congestive heart failure) ([place])   ? Chronic kidney disease (CKD), stage IV (severe) (HCC)   ? Chronic kidney disease, stage 3 (HCC)   ? Congestive heart failure with LV diastolic dysfunction, NYHA class 2 ([place])   ? Family history of adverse reaction to anesthesia   ? father- change in personaility  ? GERD (gastroesophageal reflux disease)   ? Hypertension   ? Hypertension associated with diabetes ([place])   ? Hypertensive chronic kidney disease   ? Insulin dependent type 2 diabetes mellitus ([place])   ? Neuropathy   ? OSA on CPAP   ? Sleep apnea   ? uses cpap, pt does not know settings  ?  ?Family History  ?Problem Relation Age of Onset  ? Hypertension Mother   ? Anesthesia problems Neg  Hx   ? Hypotension Neg Hx   ? Malignant hyperthermia Neg Hx   ? Pseudochol deficiency Neg Hx   ?  ?Past Surgical History:  ?Procedure Laterality Date  ? ANTERIOR LAT LUMBAR FUSION Left 01/22/2021  ? Procedure: Lumbar one-Lumbar two Lateral Lumbar Interbody Fusion;  Surgeon: Vallarie Mare, MD;  Location: [facility];  Service: Neurosurgery;  Laterality: Left;  ? ANTERIOR LAT LUMBAR FUSION  01/22/2021  ? APPENDECTOMY    ? BACK SURGERY    ? 4 surg, lower  ? CHOLECYSTECTOMY    ? COLONOSCOPY WITH PROPOFOL N/A 06/11/2014  ? Procedure: COLONOSCOPY WITH  PROPOFOL;  Surgeon: Garlan Fair, MD;  [facility];  Service: Endoscopy;  Laterality: N/A;  ? ESOPHAGOGASTRODUODENOSCOPY  08/26/2011  ? Procedure: ESOPHAGOGASTRODUODENOSCOPY (EGD);  Surgeon: Garlan Fair, MD;  [facility];  Service: Endoscopy;  Laterality: N/A;  ? ESOPHAGOGASTRODUODENOSCOPY (EGD) WITH PROPOFOL N/A 06/11/2014  ? Procedure: ESOPHAGOGASTRODUODENOSCOPY (EGD) WITH PROPOFOL;  Surgeon: Garlan Fair, MD;  [facility];  Service: Endoscopy;  Laterality: N/A;  ? EUS  09/15/2011  ? Procedure: UPPER ENDOSCOPIC ULTRASOUND (EUS) LINEAR;  Surgeon: Landry Dyke, MD;  [facility];  Service: Endoscopy;  Laterality: N/A;  MAC  ? I & D EXTREMITY Right 05/20/2021  ? Procedure: IRRIGATION AND DEBRIDEMENT OF LEG AND APPLICATION OF SKIN GRAFT;  Surgeon: Newt Minion, MD;  Location: [facility];  Service: Orthopedics;  Laterality: Right;  ? I & D EXTREMITY Right 07/03/2021  ? Procedure: REPEAT RIGHT LEG DEBRIDEMENT;  Surgeon: Newt Minion, MD;  Location: [facility];  Service: Orthopedics;  Laterality: Right;  ? I & D EXTREMITY Right 07/01/2021  ? Procedure: RIGHT LEG DEBRIDEMENT;  Surgeon: Newt Minion, MD;  Location: [facility];  Service: Orthopedics;  Laterality: Right;  ? KNEE ARTHROSCOPY Right YEARS AGO  ? ?Social History  ? ?Occupational History  ? Occupation: retired  ?Tobacco Use  ? Smoking status: Former  ?  Packs/day: 3.00  ?   Years: 20.00  ?  Pack years: 60.00  ?  Types: Cigarettes  ?  Quit date: 27  ?  Years since quitting: 39.2  ? Smokeless tobacco: Never  ?Vaping Use  ? Vaping Use: Never used  ?Substance and Sexual Activity

## 2021-11-05 ENCOUNTER — Telehealth: Payer: Self-pay | Admitting: Orthopedic Surgery

## 2021-11-05 ENCOUNTER — Encounter: Payer: Medicare Other | Admitting: Orthopedic Surgery

## 2021-11-05 ENCOUNTER — Other Ambulatory Visit: Payer: Self-pay | Admitting: Orthopedic Surgery

## 2021-11-05 MED ORDER — DOXYCYCLINE HYCLATE 100 MG PO TABS
100.0000 mg | ORAL_TABLET | Freq: Two times a day (BID) | ORAL | 0 refills | Status: DC
Start: 2021-11-05 — End: 2022-04-27

## 2021-11-05 NOTE — Telephone Encounter (Signed)
LM on VM that new abx has been sent in and okay to take with kidney function. ?

## 2021-11-05 NOTE — Telephone Encounter (Signed)
Patient's wife Judeen Hammans called advised patient was told to discontinue taking Bactrim because it is bad for his Kidneys by his Kidney doctor. Judeen Hammans said the Kidney doctor said patient should not take Bactrim after 3 days. Judeen Hammans asked if there is a more Kidney friendly  medication. The number to contact Judeen Hammans is [phone]   ?

## 2021-11-05 NOTE — Telephone Encounter (Signed)
Please advise 

## 2021-11-11 ENCOUNTER — Ambulatory Visit (INDEPENDENT_AMBULATORY_CARE_PROVIDER_SITE_OTHER): Payer: Medicare Other | Admitting: Cardiovascular Disease

## 2021-11-11 ENCOUNTER — Encounter: Payer: Self-pay | Admitting: Cardiovascular Disease

## 2021-11-11 VITALS — BP 102/62 | HR 96 | Ht 70.0 in | Wt 222.2 lb

## 2021-11-11 DIAGNOSIS — I5042 Chronic combined systolic (congestive) and diastolic (congestive) heart failure: Secondary | ICD-10-CM

## 2021-11-11 DIAGNOSIS — N184 Chronic kidney disease, stage 4 (severe): Secondary | ICD-10-CM

## 2021-11-11 DIAGNOSIS — E7800 Pure hypercholesterolemia, unspecified: Secondary | ICD-10-CM | POA: Diagnosis not present

## 2021-11-11 DIAGNOSIS — E1122 Type 2 diabetes mellitus with diabetic chronic kidney disease: Secondary | ICD-10-CM | POA: Diagnosis not present

## 2021-11-11 DIAGNOSIS — Z7952 Long term (current) use of systemic steroids: Secondary | ICD-10-CM | POA: Diagnosis not present

## 2021-11-11 DIAGNOSIS — I10 Essential (primary) hypertension: Secondary | ICD-10-CM | POA: Diagnosis not present

## 2021-11-11 DIAGNOSIS — I491 Atrial premature depolarization: Secondary | ICD-10-CM

## 2021-11-11 DIAGNOSIS — I700 Atherosclerosis of aorta: Secondary | ICD-10-CM

## 2021-11-11 NOTE — Progress Notes (Addendum)
?Cardiology Office Note:   ? ?Date:  11/13/2021  ? ?ID:  Robert Dickerson, DOB 07-10-1941, MRN [id number] ? ?PCP:  Lavone Orn, MD ?  ?[facility] Providers ?Cardiologist:  Sanda Klein, MD    ? ?Referring MD: Lavone Orn, MD  ? ?Chief Complaint  ?Patient presents with  ? Congestive Heart Failure  ? ? ?History of Present Illness:   ? ?Robert Dickerson is a 81 y.o. 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 ? ?He was recently hospitalized with acute right and left heart failure after his diuretics were discontinued for worsening renal function.  It also appears that left ventricular systolic function has deteriorated with an ejection fraction that is now down to about 35%.  The pattern of LV dysfunction is global.  Coronary angiography was deferred to due to the risk of progression to end-stage renal disease.  The patient has stated that he does not want to ever go on dialysis.  Admission ECG raise concern for atrial fibrillation due to frequent PACs and baseline artifact, but careful review of stat electrocardiogram, repeat tracings and all his telemetry tracings do not show any evidence of true atrial fibrillation, just frequent PACs. ? ?Since his last appointment he appeared to be over diuresed, we asked him to hold his furosemide temporarily.  He is now taking furosemide 80 mg just once daily instead of twice daily.  He has gained back a little bit of weight.  Typically at home over the last week he has weighed 214-215 pounds, although today this is a little bit higher 217 pounds.  On our office scale he weighs 222 pounds, 5 pounds more than at home.  Best estimation of "dry weight" is a target of about 218-220 pounds on the office scale, corresponding to about 213-215 pounds at home. ? ?His nephrologist is Dr.  Joelyn Oms.  He had labs in his office last week, but I do not have those results.  Most recent creatinine on 09/24/2021 was 2.92 ? ?He denies shortness of breath at rest, orthopnea or PND.  He has been fairly sedentary but has no dyspnea walking through the house.  He denies lower extremity edema.  He denies chest pain at rest or with activity.  He has not had dizziness or syncope.  He denies cramps or tremor. ? ?He has a superficial skin wound roughly 5 x 6 cm quadrangular in the right pretibial area.  This is showing some evidence of granulation.  There is no redness drainage or other sign of infection. ? ?Glycemic control has shown some improvement according to his home checks, but his most recent hemoglobin A1c from January was elevated at 8.2%.  Most recent LDL was 76.  Good HDL at 64.  (Lipid profile on August 26, 2021. ? ?He does not have known CAD or PAD, but does have evidence of aortic atherosclerosis on imaging studies.  We have avoided all contrast based procedures due to his renal dysfunction. ? ?He was diagnosed with congestive heart failure in 2015 when his echocardiogram showed the EF 50-55% and "grade 1 diastolic dysfunction".  Frequent PACs were noted on that report.  A nuclear stress test performed in April 2016 did not show perfusion abnormalities.  He has never had cardiac catheterization. ? ?Past Medical History:  ?Diagnosis Date  ? Arthritis   ?  all joints  ? Chronic diastolic CHF (congestive heart failure) ([place])   ? Chronic kidney disease (CKD), stage IV (severe) (HCC)   ? Chronic kidney disease, stage 3 (HCC)   ? Congestive heart failure with LV diastolic dysfunction, NYHA class 2 ([place])   ? Family history of adverse reaction to anesthesia   ? father- change in personaility  ? GERD (gastroesophageal reflux disease)   ? Hypertension   ? Hypertension associated with diabetes ([place])   ? Hypertensive chronic kidney disease   ? Insulin dependent type 2 diabetes mellitus ([place])   ? Neuropathy   ? OSA  on CPAP   ? Sleep apnea   ? uses cpap, pt does not know settings  ? ? ?Past Surgical History:  ?Procedure Laterality Date  ? ANTERIOR LAT LUMBAR FUSION Left 01/22/2021  ? Procedure: Lumbar one-Lumbar two Lateral Lumbar Interbody Fusion;  Surgeon: Vallarie Mare, MD;  Location: [facility];  Service: Neurosurgery;  Laterality: Left;  ? ANTERIOR LAT LUMBAR FUSION  01/22/2021  ? APPENDECTOMY    ? BACK SURGERY    ? 4 surg, lower  ? CHOLECYSTECTOMY    ? COLONOSCOPY WITH PROPOFOL N/A 06/11/2014  ? Procedure: COLONOSCOPY WITH PROPOFOL;  Surgeon: Garlan Fair, MD;  [facility];  Service: Endoscopy;  Laterality: N/A;  ? ESOPHAGOGASTRODUODENOSCOPY  08/26/2011  ? Procedure: ESOPHAGOGASTRODUODENOSCOPY (EGD);  Surgeon: Garlan Fair, MD;  [facility];  Service: Endoscopy;  Laterality: N/A;  ? ESOPHAGOGASTRODUODENOSCOPY (EGD) WITH PROPOFOL N/A 06/11/2014  ? Procedure: ESOPHAGOGASTRODUODENOSCOPY (EGD) WITH PROPOFOL;  Surgeon: Garlan Fair, MD;  [facility];  Service: Endoscopy;  Laterality: N/A;  ? EUS  09/15/2011  ? Procedure: UPPER ENDOSCOPIC ULTRASOUND (EUS) LINEAR;  Surgeon: Landry Dyke, MD;  [facility];  Service: Endoscopy;  Laterality: N/A;  MAC  ? I & D EXTREMITY Right 05/20/2021  ? Procedure: IRRIGATION AND DEBRIDEMENT OF LEG AND APPLICATION OF SKIN GRAFT;  Surgeon: Newt Minion, MD;  Location: [facility];  Service: Orthopedics;  Laterality: Right;  ? I & D EXTREMITY Right 07/03/2021  ? Procedure: REPEAT RIGHT LEG DEBRIDEMENT;  Surgeon: Newt Minion, MD;  Location: [facility];  Service: Orthopedics;  Laterality: Right;  ? I & D EXTREMITY Right 07/01/2021  ? Procedure: RIGHT LEG DEBRIDEMENT;  Surgeon: Newt Minion, MD;  Location: [facility];  Service: Orthopedics;  Laterality: Right;  ? KNEE ARTHROSCOPY Right YEARS AGO  ? ? ?Current Medications: ?Current Meds  ?Medication Sig  ? ACCU-CHEK GUIDE test strip daily before breakfast.  ? Accu-Chek Softclix Lancets lancets daily before  breakfast.  ? allopurinol (ZYLOPRIM) 300 MG tablet Take 300 mg by mouth daily.  ? atorvastatin (LIPITOR) 20 MG tablet Take 1 tablet (20 mg total) by mouth daily.  ? blood glucose meter kit and supplies KIT Dispense based on patient and insurance preference. Use up to four times daily as directed. (Patient taking differently: daily before breakfast.)  ? blood glucose meter kit and supplies by Other route as directed. Check blood sugar every morning  ? clobetasol cream (TEMOVATE) 5.91 % APPLY 1 APPLICATION TOPICALLY TWICE DAILY  ? doxycycline (VIBRA-TABS) 100 MG tablet Take 1 tablet (100 mg total) by mouth 2 (two) times daily.  ? doxycycline (VIBRA-TABS) 100 MG tablet Take 1 tablet (100 mg total) by mouth 2 (two) times daily.  ? furosemide (LASIX) 80 MG tablet Take 1 tablet (80 mg total) by mouth daily.  ? hydrALAZINE (APRESOLINE) 10 MG tablet Take 1  tablet (10 mg total) by mouth every 8 (eight) hours.  ? isosorbide dinitrate (ISORDIL) 10 MG tablet Take 1 tablet (10 mg total) by mouth 3 (three) times daily.  ? LANTUS SOLOSTAR 100 UNIT/ML Solostar Pen Inject 10 Units into the skin in the morning.  ? metolazone (ZAROXOLYN) 5 MG tablet Take 5 mg by mouth daily.  ? metoprolol tartrate (LOPRESSOR) 25 MG tablet Take 1 tablet (25 mg total) by mouth 2 (two) times daily.  ? NON FORMULARY CPAP  at bedtime  ? pentoxifylline (TRENTAL) 400 MG CR tablet Take 1 tablet (400 mg total) by mouth 3 (three) times daily with meals.  ? predniSONE (DELTASONE) 5 MG tablet Take 5 mg by mouth daily with breakfast.  ? pregabalin (LYRICA) 75 MG capsule Take 75 mg by mouth 2 (two) times daily.  ?  ? ?Allergies:   Oxycodone hcl, Ace inhibitors, Lisinopril, and Oxycodone  ? ?Social History  ? ?Socioeconomic History  ? Marital status: Married  ?  Spouse name: Judeen Hammans  ? Number of children: Not on file  ? Years of education: Not on file  ? Highest education level: Not on file  ?Occupational History  ? Occupation: retired  ?Tobacco Use  ? Smoking  status: Former  ?  Packs/day: 3.00  ?  Years: 20.00  ?  Pack years: 60.00  ?  Types: Cigarettes  ?  Quit date: 31  ?  Years since quitting: 39.3  ? Smokeless tobacco: Never  ?Vaping Use  ? Vaping Use: Never used

## 2021-11-11 NOTE — Patient Instructions (Signed)
Medication Instructions:  No changes *If you need a refill on your cardiac medications before your next appointment, please call your pharmacy*   Lab Work: None ordered If you have labs (blood work) drawn today and your tests are completely normal, you will receive your results only by: MyChart Message (if you have MyChart) OR A paper copy in the mail If you have any lab test that is abnormal or we need to change your treatment, we will call you to review the results.   Testing/Procedures: None ordered   Follow-Up: At [facility], you and your health needs are our priority.  As part of our continuing mission to provide you with exceptional heart care, we have created designated Provider Care Teams.  These Care Teams include your primary Cardiologist (physician) and Advanced Practice Providers (APPs -  Physician Assistants and Nurse Practitioners) who all work together to provide you with the care you need, when you need it.  We recommend signing up for the patient portal called "MyChart".  Sign up information is provided on this After Visit Summary.  MyChart is used to connect with patients for Virtual Visits (Telemedicine).  Patients are able to view lab/test results, encounter notes, upcoming appointments, etc.  Non-urgent messages can be sent to your provider as well.   To learn more about what you can do with MyChart, go to [email or web address].    Your next appointment:   6 month(s)  The format for your next appointment:   In Person  Provider:   Mihai Croitoru, MD {    Important Information About Sugar       

## 2021-11-12 ENCOUNTER — Ambulatory Visit (INDEPENDENT_AMBULATORY_CARE_PROVIDER_SITE_OTHER): Payer: Medicare Other | Admitting: Orthopedic Surgery

## 2021-11-12 DIAGNOSIS — Z945 Skin transplant status: Secondary | ICD-10-CM | POA: Diagnosis not present

## 2021-11-20 DIAGNOSIS — I5040 Unspecified combined systolic (congestive) and diastolic (congestive) heart failure: Secondary | ICD-10-CM | POA: Diagnosis not present

## 2021-11-20 DIAGNOSIS — Z23 Encounter for immunization: Secondary | ICD-10-CM | POA: Diagnosis not present

## 2021-11-20 DIAGNOSIS — M353 Polymyalgia rheumatica: Secondary | ICD-10-CM | POA: Diagnosis not present

## 2021-11-20 DIAGNOSIS — E1140 Type 2 diabetes mellitus with diabetic neuropathy, unspecified: Secondary | ICD-10-CM | POA: Diagnosis not present

## 2021-11-20 DIAGNOSIS — N184 Chronic kidney disease, stage 4 (severe): Secondary | ICD-10-CM | POA: Diagnosis not present

## 2021-11-20 DIAGNOSIS — E1159 Type 2 diabetes mellitus with other circulatory complications: Secondary | ICD-10-CM | POA: Diagnosis not present

## 2021-11-20 DIAGNOSIS — I739 Peripheral vascular disease, unspecified: Secondary | ICD-10-CM | POA: Diagnosis not present

## 2021-11-20 DIAGNOSIS — E0859 Diabetes mellitus due to underlying condition with other circulatory complications: Secondary | ICD-10-CM | POA: Diagnosis not present

## 2021-11-20 DIAGNOSIS — I129 Hypertensive chronic kidney disease with stage 1 through stage 4 chronic kidney disease, or unspecified chronic kidney disease: Secondary | ICD-10-CM | POA: Diagnosis not present

## 2021-11-22 ENCOUNTER — Encounter: Payer: Self-pay | Admitting: Orthopedic Surgery

## 2021-11-22 NOTE — Progress Notes (Signed)
? ?Office Visit Note ?  ?Patient: Robert Dickerson           ?Date of Birth: Nov 30, 1940           ?MRN: [id number] ?Visit Date: 11/12/2021 ?             ?Requested by: Lavone Orn, MD ?[address] ?[address] ?[place],  [place] [address] ?PCP: Lavone Orn, MD ? ?Chief Complaint  ?Patient presents with  ? Right Leg - Follow-up  ?  07/03/21 RLE deb and kerecis  ? ? ? ? ?HPI: ?Patient is a 81 year old gentleman who is status post debridement right lower extremity and application of Kerecis.  Patient states that everything is pretty good.  He states he does have some right foot pain. ? ?Assessment & Plan: ?Visit Diagnoses:  ?1. History of skin graft   ? ? ?Plan: Patient's wound bed is stable but appears to have stalled in its progress.  Patient will continue with routine wound care at follow-up may apply some Kerecis micro powder. ? ?Follow-Up Instructions: Return in about 4 weeks (around 12/10/2021).  ? ?Ortho Exam ? ?Patient is alert, oriented, no adenopathy, well-dressed, normal affect, normal respiratory effort. ?Examination patient has brawny edema of the right leg.  The wound has some mild epiboly around the wound edges.  There is no cellulitis no odor no drainage.  The wound is 4 x 6 cm.  Patient is completing his course of doxycycline. ? ?Imaging: ?No results found. ? ? ?Labs: ?Lab Results  ?Component Value Date  ? HGBA1C 8.2 (H) 08/25/2021  ? HGBA1C 7.6 (H) 05/19/2021  ? HGBA1C 9.5 (H) 12/23/2020  ? LABURIC 6.4 03/04/2011  ? REPTSTATUS 07/06/2021 FINAL 07/01/2021  ? GRAMSTAIN NO WBC SEEN ?NO ORGANISMS SEEN ? 07/01/2021  ? CULT  07/01/2021  ?  FEW STAPHYLOCOCCUS AUREUS ?RARE PROTEUS MIRABILIS ?NO ANAEROBES ISOLATED ?Performed at [facility], [place] [address], [place], [place] [address] ?  ? [place] STAPHYLOCOCCUS AUREUS 07/01/2021  ? [place] 07/01/2021  ? ? ? ?Lab Results  ?Component Value Date  ? ALBUMIN 2.4 (L) 08/27/2021  ? ALBUMIN 2.5 (L) 08/25/2021  ? ALBUMIN 2.6 (L) 06/05/2021   ? ? ?Lab Results  ?Component Value Date  ? MG 2.2 08/25/2021  ? MG 2.2 05/19/2021  ? MG 2.0 12/23/2020  ? ?No results found for: VD25OH ? ?No results found for: PREALBUMIN ? ?  Latest Ref Rng & Units 08/28/2021  ?  2:58 AM 08/27/2021  ?  4:26 AM 08/26/2021  ?  6:15 AM  ?CBC EXTENDED  ?WBC 4.0 - 10.5 K/uL 7.6   10.2   14.4    ?RBC 4.22 - 5.81 MIL/uL 3.97   3.98   4.12    ?Hemoglobin 13.0 - 17.0 g/dL 11.2   11.2   11.7    ?HCT 39.0 - 52.0 % 33.9   34.4   36.2    ?Platelets 150 - 400 K/uL 313   329   300    ? ? ? ?There is no height or weight on file to calculate BMI. ? ?Orders:  ?No orders of the defined types were placed in this encounter. ? ?No orders of the defined types were placed in this encounter. ? ? ? Procedures: ?No procedures performed ? ?Clinical Data: ?No additional findings. ? ?ROS: ? ?All other systems negative, except as noted in the HPI. ?Review of Systems ? ?Objective: ?Vital Signs: There were no vitals taken for this visit. ? ?  Specialty Comments:  ?No specialty comments available. ? ?PMFS History: ?Patient Active Problem List  ? Diagnosis Date Noted  ? Aortic atherosclerosis ([place]) 09/04/2021  ? Hypercholesterolemia 09/04/2021  ? Unspecified atrial fibrillation ([place]) 08/25/2021  ? Acute on chronic diastolic CHF (congestive heart failure) ([place]) 08/25/2021  ? Wound of right leg, sequela 07/01/2021  ? Nail, injury by 06/10/2021  ? Laceration of leg 05/20/2021  ? Acute respiratory failure with hypoxia ([place]) 05/18/2021  ? Chronic combined systolic and diastolic heart failure ([place]) 05/18/2021  ? Chronic kidney disease (CKD), stage IV (severe) ([place]) 05/18/2021  ? Hypertension associated with diabetes ([place]) 05/18/2021  ? Insulin dependent type 2 diabetes mellitus ([place]) 05/18/2021  ? OSA on CPAP 05/18/2021  ? Laceration of right lower extremity 05/18/2021  ? Current chronic use of systemic steroids 01/08/2021  ? Preoperative cardiovascular examination 01/08/2021  ? Cellulitis 12/23/2020  ? 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  ? Chronic kidney disease, stage 4 (severe) ([place]) 12/03/2020  ? Degeneration of lumbar intervertebral disc 12/03/2020  ? Type 2 diabetes mellitus with stage 4 chronic kidney disease ([place]) 12/03/2020  ? Diabetic peripheral neuropathy associated with type 2 diabetes mellitus ([place]) 12/03/2020  ? Diastolic heart failure ([place]) 12/03/2020  ? Enlarged prostate 12/03/2020  ? Erectile dysfunction due to arterial insufficiency 12/03/2020  ? Essential hypertension 12/03/2020  ? Gastro-esophageal reflux disease without esophagitis 12/03/2020  ? Hematuria 12/03/2020  ? 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 Morbid obesity ([place]) 12/03/2020  ? 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 Restless legs 12/03/2020  ? Skin sensation disturbance 12/03/2020  ? Lumbar stenosis 11/21/2020  ? Tendinitis 09/03/2020  ? Subacute arthropathy 09/03/2020  ? Bilateral hearing loss 08/11/2020  ? Impacted cerumen of left ear 03/10/2017  ? Excessive cerumen in left ear canal 03/10/2017  ? Faintness 12/09/2015  ? Backache 11/14/2014  ? CHRONIC PANCREATITIS 06/16/2009  ? ?Past Medical History:  ?Diagnosis Date  ? Arthritis   ? all joints  ? Chronic diastolic CHF (congestive heart failure) ([place])   ? Chronic kidney disease (CKD), stage IV (severe) (HCC)   ? Chronic kidney disease, stage 3 (HCC)   ? Congestive heart failure with LV diastolic dysfunction, NYHA class 2 ([place])   ? Family history of adverse reaction to anesthesia   ? father- change in personaility  ? GERD (gastroesophageal reflux disease)   ? Hypertension   ? Hypertension associated with diabetes ([place])   ? Hypertensive chronic kidney disease   ? Insulin dependent type 2 diabetes mellitus ([place])   ? Neuropathy   ? OSA on CPAP   ? Sleep apnea   ? uses cpap, pt does not know settings  ?  ?Family History  ?Problem Relation Age of  Onset  ? Hypertension Mother   ? Anesthesia problems Neg Hx   ? Hypotension Neg Hx   ? Malignant hyperthermia Neg Hx   ? Pseudochol deficiency Neg Hx   ?  ?Past Surgical History:  ?Procedure Laterality Date  ? ANTERIOR LAT LUMBAR FUSION Left 01/22/2021  ? Procedure: Lumbar one-Lumbar two Lateral Lumbar Interbody Fusion;  Surgeon: Vallarie Mare, MD;  Location: [facility];  Service: Neurosurgery;  Laterality: Left;  ? ANTERIOR LAT LUMBAR FUSION  01/22/2021  ? APPENDECTOMY    ? BACK SURGERY    ? 4 surg, lower  ? CHOLECYSTECTOMY    ? COLONOSCOPY WITH  PROPOFOL N/A 06/11/2014  ? Procedure: COLONOSCOPY WITH PROPOFOL;  Surgeon: Garlan Fair, MD;  [facility];  Service: Endoscopy;  Laterality: N/A;  ? ESOPHAGOGASTRODUODENOSCOPY  08/26/2011  ? Procedure: ESOPHAGOGASTRODUODENOSCOPY (EGD);  Surgeon: Garlan Fair, MD;  [facility];  Service: Endoscopy;  Laterality: N/A;  ? ESOPHAGOGASTRODUODENOSCOPY (EGD) WITH PROPOFOL N/A 06/11/2014  ? Procedure: ESOPHAGOGASTRODUODENOSCOPY (EGD) WITH PROPOFOL;  Surgeon: Garlan Fair, MD;  [facility];  Service: Endoscopy;  Laterality: N/A;  ? EUS  09/15/2011  ? Procedure: UPPER ENDOSCOPIC ULTRASOUND (EUS) LINEAR;  Surgeon: Landry Dyke, MD;  [facility];  Service: Endoscopy;  Laterality: N/A;  MAC  ? I & D EXTREMITY Right 05/20/2021  ? Procedure: IRRIGATION AND DEBRIDEMENT OF LEG AND APPLICATION OF SKIN GRAFT;  Surgeon: Newt Minion, MD;  Location: [facility];  Service: Orthopedics;  Laterality: Right;  ? I & D EXTREMITY Right 07/03/2021  ? Procedure: REPEAT RIGHT LEG DEBRIDEMENT;  Surgeon: Newt Minion, MD;  Location: [facility];  Service: Orthopedics;  Laterality: Right;  ? I & D EXTREMITY Right 07/01/2021  ? Procedure: RIGHT LEG DEBRIDEMENT;  Surgeon: Newt Minion, MD;  Location: [facility];  Service: Orthopedics;  Laterality: Right;  ? KNEE ARTHROSCOPY Right YEARS AGO  ? ?Social History  ? ?Occupational History  ? Occupation: retired  ?Tobacco  Use  ? Smoking status: Former  ?  Packs/day: 3.00  ?  Years: 20.00  ?  Pack years: 60.00  ?  Types: Cigarettes  ?  Quit date: 28  ?  Years since quitting: 39.3  ? Smokeless tobacco: Never  ?Vaping Use  ? V

## 2021-12-02 DIAGNOSIS — M5136 Other intervertebral disc degeneration, lumbar region: Secondary | ICD-10-CM | POA: Diagnosis not present

## 2021-12-03 DIAGNOSIS — Z20822 Contact with and (suspected) exposure to covid-19: Secondary | ICD-10-CM | POA: Diagnosis not present

## 2021-12-07 DIAGNOSIS — N184 Chronic kidney disease, stage 4 (severe): Secondary | ICD-10-CM | POA: Diagnosis not present

## 2021-12-07 DIAGNOSIS — I10 Essential (primary) hypertension: Secondary | ICD-10-CM | POA: Diagnosis not present

## 2021-12-07 DIAGNOSIS — E0859 Diabetes mellitus due to underlying condition with other circulatory complications: Secondary | ICD-10-CM | POA: Diagnosis not present

## 2021-12-07 DIAGNOSIS — K219 Gastro-esophageal reflux disease without esophagitis: Secondary | ICD-10-CM | POA: Diagnosis not present

## 2021-12-07 DIAGNOSIS — I5040 Unspecified combined systolic (congestive) and diastolic (congestive) heart failure: Secondary | ICD-10-CM | POA: Diagnosis not present

## 2021-12-07 DIAGNOSIS — N400 Enlarged prostate without lower urinary tract symptoms: Secondary | ICD-10-CM | POA: Diagnosis not present

## 2021-12-09 ENCOUNTER — Other Ambulatory Visit: Payer: Self-pay | Admitting: Family

## 2021-12-10 ENCOUNTER — Ambulatory Visit (INDEPENDENT_AMBULATORY_CARE_PROVIDER_SITE_OTHER): Payer: Medicare Other | Admitting: Orthopedic Surgery

## 2021-12-10 DIAGNOSIS — Z945 Skin transplant status: Secondary | ICD-10-CM

## 2021-12-10 DIAGNOSIS — M170 Bilateral primary osteoarthritis of knee: Secondary | ICD-10-CM | POA: Diagnosis not present

## 2021-12-11 ENCOUNTER — Encounter: Payer: Self-pay | Admitting: Podiatry

## 2021-12-11 ENCOUNTER — Ambulatory Visit (INDEPENDENT_AMBULATORY_CARE_PROVIDER_SITE_OTHER): Payer: Medicare Other | Admitting: Podiatry

## 2021-12-11 DIAGNOSIS — M79675 Pain in left toe(s): Secondary | ICD-10-CM | POA: Diagnosis not present

## 2021-12-11 DIAGNOSIS — M79674 Pain in right toe(s): Secondary | ICD-10-CM | POA: Diagnosis not present

## 2021-12-11 DIAGNOSIS — E1142 Type 2 diabetes mellitus with diabetic polyneuropathy: Secondary | ICD-10-CM

## 2021-12-11 DIAGNOSIS — L84 Corns and callosities: Secondary | ICD-10-CM | POA: Diagnosis not present

## 2021-12-11 DIAGNOSIS — B351 Tinea unguium: Secondary | ICD-10-CM

## 2021-12-11 NOTE — Progress Notes (Signed)
This patient returns to the office for evaluation and treatment of long thick painful nails .  This patient is unable to trim his own nails since the patient cannot reach his feet.  Patient says the nails are painful walking and wearing his shoes. Patient has history of back surgery. Patient was in [place] in October 2022.    He returns for preventive foot care services.  He presents to the office with his wife. ? ?General Appearance  Alert, conversant and in no acute stress. ? ?Vascular  Dorsalis pedis and posterior tibial  pulses are palpable  bilaterally.  Capillary return is within normal limits  bilaterally. Temperature is within normal limits  bilaterally. ? ?Neurologic  Senn-Weinstein monofilament wire test diminished  bilaterally. Muscle power within normal limits bilaterally. ? ?Nails Thick disfigured discolored nails with subungual debris  from hallux to second  toes bilaterally. No evidence of bacterial infection or drainage bilaterally.Lateral distal aspect of the second toenail is absent. ? ?Orthopedic  No limitations of motion  feet .  No crepitus or effusions noted.  No bony pathology or digital deformities noted. Prominent metatarsal heads  B/L. ? ?Skin  Dry scaly  skin with noted bilaterally.  No signs of infections or ulcers noted. Porokeratosis sub 4th met left.   ? ?Onychomycosis  Pain in toes right foot  Pain in toes left foot   ? ?Debridement  of nails  1-5  B/L with a nail nipper.  Nails were then filed using a dremel tool with no incidents. Debride callus with # 15 blade and followed by dremel tool.  Padding added.  Patient is having foot and ankle pain.  Wants to be evaluated by one of the medical doctors.  RTC 3 months  ? ? ?Gardiner Barefoot DPM   ?

## 2021-12-12 ENCOUNTER — Encounter: Payer: Self-pay | Admitting: Orthopedic Surgery

## 2021-12-12 NOTE — Progress Notes (Signed)
? ?Office Visit Note ?  ?Patient: Robert Dickerson           ?Date of Birth: [birth year]           ?MRN: [id number] ?Visit Date: 12/10/2021 ?             ?Requested by: Lavone Orn, MD ?[place] ?[address] ?[place],  [place] [address] ?PCP: Lavone Orn, MD ? ?Chief Complaint  ?Patient presents with  ? Right Leg - Follow-up  ?  07/03/21 RLE debridement and kerecis graft  ? ? ? ? ?HPI: ?Patient is a 81 year old gentleman who presents in follow-up status post right leg debridement and application of Kerecis tissue graft and December last year.  Patient states he still has edema and an open wound. ? ?Assessment & Plan: ?Visit Diagnoses:  ?1. History of skin graft   ? ? ?Plan: Kerecis powder was applied follow-up in 5 days to change the dressing.  Recommended protein supplements twice a day recommended elevation recommended moving the toes and ankles to mechanically decrease the swelling. ? ?Follow-Up Instructions: Return in about 1 week (around 12/17/2021).  ? ?Ortho Exam ? ?Patient is alert, oriented, no adenopathy, well-dressed, normal affect, normal respiratory effort. ?Examination there is still pitting edema in the right lower extremity the lateral wound has completely healed the medial calf wound is 65 x 55 mm.  There is healthy granulation tissue the wound bed is moist. ? ?After informed consent the wound was debrided and Kerecis powder was applied with a compression wrap. ? ?Imaging: ?No results found. ? ? ?Labs: ?Lab Results  ?Component Value Date  ? HGBA1C 8.2 (H) 08/25/2021  ? HGBA1C 7.6 (H) 05/19/2021  ? HGBA1C 9.5 (H) 12/23/2020  ? LABURIC 6.4 03/04/2011  ? REPTSTATUS 07/06/2021 FINAL 07/01/2021  ? GRAMSTAIN NO WBC SEEN ?NO ORGANISMS SEEN ? 07/01/2021  ? CULT  07/01/2021  ?  FEW STAPHYLOCOCCUS AUREUS ?RARE PROTEUS MIRABILIS ?NO ANAEROBES ISOLATED ?Performed at [facility], [place] [address], [place], [place] [address] ?  ? [place] STAPHYLOCOCCUS AUREUS 07/01/2021  ? [place]  07/01/2021  ? ? ? ?Lab Results  ?Component Value Date  ? ALBUMIN 2.4 (L) 08/27/2021  ? ALBUMIN 2.5 (L) 08/25/2021  ? ALBUMIN 2.6 (L) 06/05/2021  ? ? ?Lab Results  ?Component Value Date  ? MG 2.2 08/25/2021  ? MG 2.2 05/19/2021  ? MG 2.0 12/23/2020  ? ?No results found for: VD25OH ? ?No results found for: PREALBUMIN ? ?  Latest Ref Rng & Units 08/28/2021  ?  2:58 AM 08/27/2021  ?  4:26 AM 08/26/2021  ?  6:15 AM  ?CBC EXTENDED  ?WBC 4.0 - 10.5 K/uL 7.6   10.2   14.4    ?RBC 4.22 - 5.81 MIL/uL 3.97   3.98   4.12    ?Hemoglobin 13.0 - 17.0 g/dL 11.2   11.2   11.7    ?HCT 39.0 - 52.0 % 33.9   34.4   36.2    ?Platelets 150 - 400 K/uL 313   329   300    ? ? ? ?There is no height or weight on file to calculate BMI. ? ?Orders:  ?No orders of the defined types were placed in this encounter. ? ?No orders of the defined types were placed in this encounter. ? ? ? Procedures: ?No procedures performed ? ?Clinical Data: ?No additional findings. ? ?ROS: ? ?All other systems negative, except as noted in the HPI. ?Review of  Systems ? ?Objective: ?Vital Signs: There were no vitals taken for this visit. ? ?Specialty Comments:  ?No specialty comments available. ? ?PMFS History: ?Patient Active Problem List  ? Diagnosis Date Noted  ? Aortic atherosclerosis ([place]) 09/04/2021  ? Hypercholesterolemia 09/04/2021  ? Unspecified atrial fibrillation ([place]) 08/25/2021  ? Acute on chronic diastolic CHF (congestive heart failure) ([place]) 08/25/2021  ? Wound of right leg, sequela 07/01/2021  ? Nail, injury by 06/10/2021  ? Laceration of leg 05/20/2021  ? Acute respiratory failure with hypoxia ([place]) 05/18/2021  ? Chronic combined systolic and diastolic heart failure ([place]) 05/18/2021  ? Chronic kidney disease (CKD), stage IV (severe) ([place]) 05/18/2021  ? Hypertension associated with diabetes ([place]) 05/18/2021  ? Insulin dependent type 2 diabetes mellitus ([place]) 05/18/2021  ? OSA on CPAP 05/18/2021  ? Laceration of right lower extremity 05/18/2021  ? Current  chronic use of systemic steroids 01/08/2021  ? Preoperative cardiovascular examination 01/08/2021  ? Cellulitis 12/23/2020  ? Benign neoplasm of duodenum, jejunum, 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  ? 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  ? Chronic kidney disease, stage 4 (severe) ([place]) 12/03/2020  ? Degeneration of lumbar intervertebral disc 12/03/2020  ? Type 2 diabetes mellitus with stage 4 chronic kidney disease ([place]) 12/03/2020  ? Diabetic peripheral neuropathy associated with type 2 diabetes mellitus ([place]) 12/03/2020  ? Diastolic heart failure ([place]) 12/03/2020  ? Enlarged prostate 12/03/2020  ? Erectile dysfunction due to arterial insufficiency 12/03/2020  ? Essential hypertension 12/03/2020  ? Gastro-esophageal reflux disease without esophagitis 12/03/2020  ? Hematuria 12/03/2020  ? 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 Morbid obesity ([place]) 12/03/2020  ? 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 Polymyalgia rheumatica ([place]) 12/03/2020  ? Restless legs 12/03/2020  ? Skin sensation disturbance 12/03/2020  ? Lumbar stenosis 11/21/2020  ? Tendinitis 09/03/2020  ? Subacute arthropathy 09/03/2020  ? Bilateral hearing loss 08/11/2020  ? Impacted cerumen of left ear 03/10/2017  ? Excessive cerumen in left ear canal 03/10/2017  ? Faintness 12/09/2015  ? Backache 11/14/2014  ? CHRONIC PANCREATITIS 06/16/2009  ? ?Past Medical History:  ?Diagnosis Date  ? Arthritis   ? all joints  ? Chronic diastolic CHF (congestive heart failure) ([place])   ? Chronic kidney disease (CKD), stage IV (severe) (HCC)   ? Chronic kidney disease, stage 3 (HCC)   ? Congestive heart failure with LV diastolic dysfunction, NYHA class 2 ([place])   ? Family history of adverse reaction to anesthesia   ? father- change in personaility  ? GERD (gastroesophageal reflux disease)   ? Hypertension   ? Hypertension associated with diabetes ([place])   ? Hypertensive chronic kidney disease   ? Insulin dependent type 2 diabetes  mellitus ([place])   ? Neuropathy   ? OSA on CPAP   ? Sleep apnea   ? uses cpap, pt does not know settings  ?  ?Family History  ?Problem Relation Age of Onset  ? Hypertension Mother   ? Anesthesia problems Neg Hx   ? Hypotension Neg Hx   ? Malignant hyperthermia Neg Hx   ? Pseudochol deficiency Neg Hx   ?  ?Past Surgical History:  ?Procedure Laterality Date  ? ANTERIOR LAT LUMBAR FUSION Left 01/22/2021  ? Procedure: Lumbar one-Lumbar two Lateral Lumbar Interbody Fusion;  Surgeon: Vallarie Mare, MD;  Location: [facility];  Service: Neurosurgery;  Laterality: Left;  ? ANTERIOR LAT LUMBAR FUSION  01/22/2021  ? APPENDECTOMY    ? BACK SURGERY    ?  4 surg, lower  ? CHOLECYSTECTOMY    ? COLONOSCOPY WITH PROPOFOL N/A 06/11/2014  ? Procedure: COLONOSCOPY WITH PROPOFOL;  Surgeon: Garlan Fair, MD;  [facility];  Service: Endoscopy;  Laterality: N/A;  ? ESOPHAGOGASTRODUODENOSCOPY  08/26/2011  ? Procedure: ESOPHAGOGASTRODUODENOSCOPY (EGD);  Surgeon: Garlan Fair, MD;  [facility];  Service: Endoscopy;  Laterality: N/A;  ? ESOPHAGOGASTRODUODENOSCOPY (EGD) WITH PROPOFOL N/A 06/11/2014  ? Procedure: ESOPHAGOGASTRODUODENOSCOPY (EGD) WITH PROPOFOL;  Surgeon: Garlan Fair, MD;  [facility];  Service: Endoscopy;  Laterality: N/A;  ? EUS  09/15/2011  ? Procedure: UPPER ENDOSCOPIC ULTRASOUND (EUS) LINEAR;  Surgeon: Landry Dyke, MD;  [facility];  Service: Endoscopy;  Laterality: N/A;  MAC  ? I & D EXTREMITY Right 05/20/2021  ? Procedure: IRRIGATION AND DEBRIDEMENT OF LEG AND APPLICATION OF SKIN GRAFT;  Surgeon: Newt Minion, MD;  Location: [facility];  Service: Orthopedics;  Laterality: Right;  ? I & D EXTREMITY Right 07/03/2021  ? Procedure: REPEAT RIGHT LEG DEBRIDEMENT;  Surgeon: Newt Minion, MD;  Location: [facility];  Service: Orthopedics;  Laterality: Right;  ? I & D EXTREMITY Right 07/01/2021  ? Procedure: RIGHT LEG DEBRIDEMENT;  Surgeon: Newt Minion, MD;  Location: [facility];   Service: Orthopedics;  Laterality: Right;  ? KNEE ARTHROSCOPY Right YEARS AGO  ? ?Social History  ? ?Occupational History  ? Occupation: retired  ?Tobacco Use  ? Smoking status: Former  ?  Packs/day: 3.00  ?  Years: 20.0

## 2021-12-16 ENCOUNTER — Ambulatory Visit (INDEPENDENT_AMBULATORY_CARE_PROVIDER_SITE_OTHER): Payer: Medicare Other | Admitting: Family

## 2021-12-16 ENCOUNTER — Encounter: Payer: Self-pay | Admitting: Family

## 2021-12-16 DIAGNOSIS — L209 Atopic dermatitis, unspecified: Secondary | ICD-10-CM | POA: Diagnosis not present

## 2021-12-16 DIAGNOSIS — Z945 Skin transplant status: Secondary | ICD-10-CM

## 2021-12-16 NOTE — Progress Notes (Signed)
? ?Office Visit Note ?  ?Patient: Robert Dickerson           ?Date of Birth: 06-18-1941           ?MRN: [id number] ?Visit Date: 12/16/2021 ?             ?Requested by: Lavone Orn, MD ?[place] ?[address] ?[place],  [place] [address] ?PCP: Lavone Orn, MD ? ?Chief Complaint  ?Patient presents with  ? Right Leg - Follow-up  ? ? ? ? ?HPI: ?Patient is a 81 year old gentleman who presents in follow-up status post right leg debridement and application of Kerecis tissue graft and December last year.  The patient had Kerecis powder placement at visit last week.  He has been wearing a compression garment over a scaffolding dressing that was holding the graft material in place.  He has been working on elevating wiggling his toes he is also using protein supplement twice daily ?Assessment & Plan: ?Visit Diagnoses:  ?No diagnosis found. ? ? ?Plan:Begin daily Dial soap cleansing.  Compression stocking with direct skin contact he will change this daily follow-up in 2 weeks. ? ?Follow-Up Instructions: No follow-ups on file.  ? ?Ortho Exam ? ?Patient is alert, oriented, no adenopathy, well-dressed, normal affect, normal respiratory effort. ?Examination there is trace edema in the right lower extremity the lateral wound has completely healed the medial calf wound is stable..  There is healthy granulation tissue, the wound bed is moist.  Fibrinous tissue debrided with gauze. ? ?Please see attached photo ? ? ?Imaging: ?No results found. ? ? ?Labs: ?Lab Results  ?Component Value Date  ? HGBA1C 8.2 (H) 08/25/2021  ? HGBA1C 7.6 (H) 05/19/2021  ? HGBA1C 9.5 (H) 12/23/2020  ? LABURIC 6.4 03/04/2011  ? REPTSTATUS 07/06/2021 FINAL 07/01/2021  ? GRAMSTAIN NO WBC SEEN ?NO ORGANISMS SEEN ? 07/01/2021  ? CULT  07/01/2021  ?  FEW STAPHYLOCOCCUS AUREUS ?RARE PROTEUS MIRABILIS ?NO ANAEROBES ISOLATED ?Performed at [facility], [place] [address] [place], [place] [address] ?  ? [place] STAPHYLOCOCCUS AUREUS 07/01/2021  ? [place] 07/01/2021  ? ? ? ?Lab Results  ?Component Value Date  ? ALBUMIN 2.4 (L) 08/27/2021  ? ALBUMIN 2.5 (L) 08/25/2021  ? ALBUMIN 2.6 (L) 06/05/2021  ? ? ?Lab Results  ?Component Value Date  ? MG 2.2 08/25/2021  ? MG 2.2 05/19/2021  ? MG 2.0 12/23/2020  ? ?No results found for: VD25OH ? ?No results found for: PREALBUMIN ? ?  Latest Ref Rng & Units 08/28/2021  ?  2:58 AM 08/27/2021  ?  4:26 AM 08/26/2021  ?  6:15 AM  ?CBC EXTENDED  ?WBC 4.0 - 10.5 K/uL 7.6   10.2   14.4    ?RBC 4.22 - 5.81 MIL/uL 3.97   3.98   4.12    ?Hemoglobin 13.0 - 17.0 g/dL 11.2   11.2   11.7    ?HCT 39.0 - 52.0 % 33.9   34.4   36.2    ?Platelets 150 - 400 K/uL 313   329   300    ? ? ? ?There is no height or weight on file to calculate BMI. ? ?Orders:  ?No orders of the defined types were placed in this encounter. ? ?No orders of the defined types were placed in this encounter. ? ? ? Procedures: ?No procedures performed ? ?Clinical Data: ?No additional findings. ? ?ROS: ? ?All other systems negative, except as noted in the HPI. ?Review of  Systems ? ?Objective: ?Vital Signs: There were no vitals taken for this visit. ? ?Specialty Comments:  ?No specialty comments available. ? ?PMFS History: ?Patient Active Problem List  ? Diagnosis Date Noted  ? Aortic atherosclerosis ([place]) 09/04/2021  ? Hypercholesterolemia 09/04/2021  ? Unspecified atrial fibrillation ([place]) 08/25/2021  ? Acute on chronic diastolic CHF (congestive heart failure) ([place]) 08/25/2021  ? Wound of right leg, sequela 07/01/2021  ? Nail, injury by 06/10/2021  ? Laceration of leg 05/20/2021  ? Acute respiratory failure with hypoxia ([place]) 05/18/2021  ? Chronic combined systolic and diastolic heart failure ([place]) 05/18/2021  ? Chronic kidney disease (CKD), stage IV (severe) ([place]) 05/18/2021  ? Hypertension associated with diabetes ([place]) 05/18/2021  ? Insulin dependent type 2 diabetes mellitus ([place]) 05/18/2021  ? OSA on CPAP 05/18/2021  ? Laceration of right lower extremity 05/18/2021  ?  Current chronic use of systemic steroids 01/08/2021  ? Preoperative cardiovascular examination 01/08/2021  ? Cellulitis 12/23/2020  ? Benign neoplasm of duodenum, jejunum, 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  ? 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  ? Chronic kidney disease, stage 4 (severe) ([place]) 12/03/2020  ? Degeneration of lumbar intervertebral disc 12/03/2020  ? Type 2 diabetes mellitus with stage 4 chronic kidney disease ([place]) 12/03/2020  ? Diabetic peripheral neuropathy associated with type 2 diabetes mellitus ([place]) 12/03/2020  ? Diastolic heart failure ([place]) 12/03/2020  ? Enlarged prostate 12/03/2020  ? Erectile dysfunction due to arterial insufficiency 12/03/2020  ? Essential hypertension 12/03/2020  ? Gastro-esophageal reflux disease without esophagitis 12/03/2020  ? Hematuria 12/03/2020  ? 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 Morbid obesity ([place]) 12/03/2020  ? 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 Restless legs 12/03/2020  ? Skin sensation disturbance 12/03/2020  ? Lumbar stenosis 11/21/2020  ? Tendinitis 09/03/2020  ? Subacute arthropathy 09/03/2020  ? Bilateral hearing loss 08/11/2020  ? Impacted cerumen of left ear 03/10/2017  ? Excessive cerumen in left ear canal 03/10/2017  ? Faintness 12/09/2015  ? Backache 11/14/2014  ? CHRONIC PANCREATITIS 06/16/2009  ? ?Past Medical History:  ?Diagnosis Date  ? Arthritis   ? all joints  ? Chronic diastolic CHF (congestive heart failure) ([place])   ? Chronic kidney disease (CKD), stage IV (severe) (HCC)   ? Chronic kidney disease, stage 3 (HCC)   ? Congestive heart failure with LV diastolic dysfunction, NYHA class 2 ([place])   ? Family history of adverse reaction to anesthesia   ? father- change in personaility  ? GERD (gastroesophageal reflux disease)   ? Hypertension   ? Hypertension associated with diabetes ([place])   ? Hypertensive chronic kidney disease   ? Insulin dependent type 2  diabetes mellitus ([place])   ? Neuropathy   ? OSA on CPAP   ? Sleep apnea   ? uses cpap, pt does not know settings  ?  ?Family History  ?Problem Relation Age of Onset  ? Hypertension Mother   ? Anesthesia problems Neg Hx   ? Hypotension Neg Hx   ? Malignant hyperthermia Neg Hx   ? Pseudochol deficiency Neg Hx   ?  ?Past Surgical History:  ?Procedure Laterality Date  ? ANTERIOR LAT LUMBAR FUSION Left 01/22/2021  ? Procedure: Lumbar one-Lumbar two Lateral Lumbar Interbody Fusion;  Surgeon: Vallarie Mare, MD;  Location: [facility];  Service: Neurosurgery;  Laterality: Left;  ? ANTERIOR LAT LUMBAR FUSION  01/22/2021  ? APPENDECTOMY    ? BACK SURGERY    ?  4 surg, lower  ? CHOLECYSTECTOMY    ? COLONOSCOPY WITH PROPOFOL N/A 06/11/2014  ? Procedure: COLONOSCOPY WITH PROPOFOL;  Surgeon: Garlan Fair, MD;  [facility];  Service: Endoscopy;  Laterality: N/A;  ? ESOPHAGOGASTRODUODENOSCOPY  08/26/2011  ? Procedure: ESOPHAGOGASTRODUODENOSCOPY (EGD);  Surgeon: Garlan Fair, MD;  [facility];  Service: Endoscopy;  Laterality: N/A;  ? ESOPHAGOGASTRODUODENOSCOPY (EGD) WITH PROPOFOL N/A 06/11/2014  ? Procedure: ESOPHAGOGASTRODUODENOSCOPY (EGD) WITH PROPOFOL;  Surgeon: Garlan Fair, MD;  [facility];  Service: Endoscopy;  Laterality: N/A;  ? EUS  09/15/2011  ? Procedure: UPPER ENDOSCOPIC ULTRASOUND (EUS) LINEAR;  Surgeon: Landry Dyke, MD;  [facility];  Service: Endoscopy;  Laterality: N/A;  MAC  ? I & D EXTREMITY Right 05/20/2021  ? Procedure: IRRIGATION AND DEBRIDEMENT OF LEG AND APPLICATION OF SKIN GRAFT;  Surgeon: Newt Minion, MD;  Location: [facility];  Service: Orthopedics;  Laterality: Right;  ? I & D EXTREMITY Right 07/03/2021  ? Procedure: REPEAT RIGHT LEG DEBRIDEMENT;  Surgeon: Newt Minion, MD;  Location: [facility];  Service: Orthopedics;  Laterality: Right;  ? I & D EXTREMITY Right 07/01/2021  ? Procedure: RIGHT LEG DEBRIDEMENT;  Surgeon: Newt Minion, MD;  Location: [facility];  Service: Orthopedics;  Laterality: Right;  ? KNEE ARTHROSCOPY Right YEARS AGO  ? ?Social History  ? ?Occupational History  ? Occupation: retired  ?Tobacco Use  ? Smoking status: Former  ?  Packs/day: 3.00

## 2021-12-17 ENCOUNTER — Ambulatory Visit: Payer: Medicare Other | Admitting: Podiatry

## 2021-12-17 ENCOUNTER — Ambulatory Visit (INDEPENDENT_AMBULATORY_CARE_PROVIDER_SITE_OTHER): Payer: Medicare Other | Admitting: Podiatry

## 2021-12-17 ENCOUNTER — Ambulatory Visit (INDEPENDENT_AMBULATORY_CARE_PROVIDER_SITE_OTHER): Payer: Medicare Other

## 2021-12-17 DIAGNOSIS — M170 Bilateral primary osteoarthritis of knee: Secondary | ICD-10-CM | POA: Diagnosis not present

## 2021-12-17 DIAGNOSIS — E1142 Type 2 diabetes mellitus with diabetic polyneuropathy: Secondary | ICD-10-CM

## 2021-12-17 DIAGNOSIS — L97919 Non-pressure chronic ulcer of unspecified part of right lower leg with unspecified severity: Secondary | ICD-10-CM

## 2021-12-17 DIAGNOSIS — I83019 Varicose veins of right lower extremity with ulcer of unspecified site: Secondary | ICD-10-CM | POA: Diagnosis not present

## 2021-12-17 DIAGNOSIS — M779 Enthesopathy, unspecified: Secondary | ICD-10-CM

## 2021-12-18 NOTE — Progress Notes (Signed)
Subjective:   Patient ID: Robert Dickerson, male   DOB: 81 y.o.   MRN: [id number]   HPI Patient presents concerned because he is developing pain in both of his feet its been there for a long time but it seems to be getting worse has a lot of tingling and burning associated with it and does not seem to be emanating from one spot   ROS      Objective:  Physical Exam  Neurovascular status was checked today pulses are intact DP PT and I did note moderate diminishment neurological both sharp dull vibratory.  He has good range of motion subtalar midtarsal joint no crepitus mild swelling in the forefoot around the MPJs low-grade and I was unable to ascertain an area of intense discomfort it appears to be diffuse and appears to be more neuropathic     Assessment:  Appears to be more neuropathic pain versus inflammatory pain     Plan:  H&P reviewed condition and I have recommended that he continue his Lyrica that he is taking and I do not see any other treatments that would be of benefit to him currently.  Continue with routine nail care if symptoms worsen he will be seen back again  X-rays indicate mild arthritis but I did not note any significant pathology at the current time that would explain the discomfort he is experiencing

## 2021-12-24 DIAGNOSIS — M170 Bilateral primary osteoarthritis of knee: Secondary | ICD-10-CM | POA: Diagnosis not present

## 2021-12-31 ENCOUNTER — Ambulatory Visit (INDEPENDENT_AMBULATORY_CARE_PROVIDER_SITE_OTHER): Payer: Medicare Other | Admitting: Orthopedic Surgery

## 2021-12-31 DIAGNOSIS — Z945 Skin transplant status: Secondary | ICD-10-CM

## 2022-01-01 ENCOUNTER — Encounter: Payer: Self-pay | Admitting: Orthopedic Surgery

## 2022-01-01 NOTE — Progress Notes (Signed)
Office Visit Note   Patient: Robert Dickerson           Date of Birth: 01-04-1941           MRN: [id number] Visit Date: 12/31/2021              Requested by: Lavone Orn, MD [address] [place],  [place] [address] PCP: Lavone Orn, MD  Chief Complaint  Patient presents with   Right Leg - Follow-up    07/03/21 RLE debridement kerecis graft In office application micro 9/79/89      HPI: Patient is a 81 year old gentleman who presents in follow-up for ulceration right lower extremity he is status post initial debridement October 19 with second debridement on December 2 with application of Kerecis graft.  On May 11 patient received Kerecis micro powder in the office currently wearing compression socks.  Assessment & Plan: Visit Diagnoses:  1. History of skin graft     Plan: We will submit benefits investigation to see if we can apply additional micro powder in the office.  Follow-Up Instructions: Return in about 4 weeks (around 01/28/2022).   Ortho Exam  Patient is alert, oriented, no adenopathy, well-dressed, normal affect, normal respiratory effort. Examination patient has good pulses he is currently on allopurinol 300 mg a day for gout he has pain in the second toe.  Right leg wound is 5 x 7 cm with healthy flat granulation tissue patient still has venous stasis swelling.  Patient has a Wagner grade 1 ulcer beneath the fourth metatarsal head.  After informed consent a 10 blade knife was used to debride the skin and soft tissue back to healthy viable granulation tissue the ulcer is 1 cm diameter and 1 mm deep after debridement.  A felt relieving donut is applied under his orthotic.  Imaging: No results found.    Labs: Lab Results  Component Value Date   HGBA1C 8.2 (H) 08/25/2021   HGBA1C 7.6 (H) 05/19/2021   HGBA1C 9.5 (H) 12/23/2020   LABURIC 6.4 03/04/2011   REPTSTATUS 07/06/2021 FINAL 07/01/2021   GRAMSTAIN NO WBC SEEN NO ORGANISMS SEEN  07/01/2021    CULT  07/01/2021    FEW STAPHYLOCOCCUS AUREUS RARE PROTEUS MIRABILIS NO ANAEROBES ISOLATED Performed at [facility], [place] [address] [place], [place] [address]    LABORGA STAPHYLOCOCCUS AUREUS 07/01/2021   LABORGA PROTEUS MIRABILIS 07/01/2021     Lab Results  Component Value Date   ALBUMIN 2.4 (L) 08/27/2021   ALBUMIN 2.5 (L) 08/25/2021   ALBUMIN 2.6 (L) 06/05/2021    Lab Results  Component Value Date   MG 2.2 08/25/2021   MG 2.2 05/19/2021   MG 2.0 12/23/2020   No results found for: VD25OH  No results found for: PREALBUMIN    Latest Ref Rng & Units 08/28/2021    2:58 AM 08/27/2021    4:26 AM 08/26/2021    6:15 AM  CBC EXTENDED  WBC 4.0 - 10.5 K/uL 7.6   10.2   14.4    RBC 4.22 - 5.81 MIL/uL 3.97   3.98   4.12    Hemoglobin 13.0 - 17.0 g/dL 11.2   11.2   11.7    HCT 39.0 - 52.0 % 33.9   34.4   36.2    Platelets 150 - 400 K/uL 313   329   300       There is no height or weight on file to calculate BMI.  Orders:  No orders of the defined types were placed in this encounter.  No orders of the defined types were placed in this encounter.    Procedures: No procedures performed  Clinical Data: No additional findings.  ROS:  All other systems negative, except as noted in the HPI. Review of Systems  Objective: Vital Signs: There were no vitals taken for this visit.  Specialty Comments:  No specialty comments available.  PMFS History: Patient Active Problem List   Diagnosis Date Noted   Aortic atherosclerosis ([place]) 09/04/2021   Hypercholesterolemia 09/04/2021   Unspecified atrial fibrillation ([place]) 08/25/2021   Acute on chronic diastolic CHF (congestive heart failure) ([place]) 08/25/2021   Wound of right leg, sequela 07/01/2021   Nail, injury by 06/10/2021   Laceration of leg 05/20/2021   Acute respiratory failure with hypoxia ([place]) 05/18/2021   Chronic combined systolic and diastolic heart failure ([place]) 05/18/2021   Chronic kidney disease  (CKD), stage IV (severe) ([place]) 05/18/2021   Hypertension associated with diabetes ([place]) 05/18/2021   Insulin dependent type 2 diabetes mellitus ([place]) 05/18/2021   OSA on CPAP 05/18/2021   Laceration of right lower extremity 05/18/2021   Current chronic use of systemic steroids 01/08/2021   Preoperative cardiovascular examination 01/08/2021   Cellulitis 12/23/2020   Benign neoplasm of duodenum, jejunum, 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   Chronic kidney disease, stage 4 (severe) ([place]) 12/03/2020   Degeneration of lumbar intervertebral disc 12/03/2020   Type 2 diabetes mellitus with stage 4 chronic kidney disease ([place]) 12/03/2020   Diabetic peripheral neuropathy associated with type 2 diabetes mellitus ([place]) 28/36/6294   Diastolic heart failure ([place]) 12/03/2020   Enlarged prostate 12/03/2020   Erectile dysfunction due to arterial insufficiency 12/03/2020   Essential hypertension 12/03/2020   Gastro-esophageal reflux disease without esophagitis 12/03/2020   Hematuria 12/03/2020   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   Morbid obesity ([place]) 12/03/2020   Osteoarthritis of hip 12/03/2020   Osteoarthritis of knee 12/03/2020   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   Restless legs 12/03/2020   Skin sensation disturbance 12/03/2020   Lumbar stenosis 11/21/2020   Tendinitis 09/03/2020   Subacute arthropathy 09/03/2020   Bilateral hearing loss 08/11/2020   Impacted cerumen of left ear 03/10/2017   Excessive cerumen in left ear canal 03/10/2017   Faintness 12/09/2015   Backache 11/14/2014   CHRONIC PANCREATITIS 06/16/2009   Past Medical History:  Diagnosis Date   Arthritis    all joints   Chronic diastolic CHF (congestive heart failure) (HCC)    Chronic kidney disease (CKD), stage IV (severe) (HCC)    Chronic kidney disease, stage 3 (HCC)    Congestive heart failure with LV diastolic dysfunction, NYHA class 2 ([place])    Family history of  adverse reaction to anesthesia    father- change in personaility   GERD (gastroesophageal reflux disease)    Hypertension    Hypertension associated with diabetes ([place])    Hypertensive chronic kidney disease    Insulin dependent type 2 diabetes mellitus (HCC)    Neuropathy    OSA on CPAP    Sleep apnea    uses cpap, pt does not know settings    Family History  Problem Relation Age of Onset   Hypertension Mother    Anesthesia problems Neg Hx    Hypotension Neg Hx    Malignant hyperthermia Neg Hx    Pseudochol deficiency Neg Hx     Past Surgical History:  Procedure Laterality Date  ANTERIOR LAT LUMBAR FUSION Left 01/22/2021   Procedure: Lumbar one-Lumbar two Lateral Lumbar Interbody Fusion;  Surgeon: Vallarie Mare, MD;  Location: [facility];  Service: Neurosurgery;  Laterality: Left;   ANTERIOR LAT LUMBAR FUSION  01/22/2021   APPENDECTOMY     BACK SURGERY     4 surg, lower   CHOLECYSTECTOMY     COLONOSCOPY WITH PROPOFOL N/A 06/11/2014   Procedure: COLONOSCOPY WITH PROPOFOL;  Surgeon: Garlan Fair, MD;  [facility];  Service: Endoscopy;  Laterality: N/A;   ESOPHAGOGASTRODUODENOSCOPY  08/26/2011   Procedure: ESOPHAGOGASTRODUODENOSCOPY (EGD);  Surgeon: Garlan Fair, MD;  [facility];  Service: Endoscopy;  Laterality: N/A;   ESOPHAGOGASTRODUODENOSCOPY (EGD) WITH PROPOFOL N/A 06/11/2014   Procedure: ESOPHAGOGASTRODUODENOSCOPY (EGD) WITH PROPOFOL;  Surgeon: Garlan Fair, MD;  [facility];  Service: Endoscopy;  Laterality: N/A;   EUS  09/15/2011   Procedure: UPPER ENDOSCOPIC ULTRASOUND (EUS) LINEAR;  Surgeon: Landry Dyke, MD;  [facility];  Service: Endoscopy;  Laterality: N/A;  MAC   I & D EXTREMITY Right 05/20/2021   Procedure: IRRIGATION AND DEBRIDEMENT OF LEG AND APPLICATION OF SKIN GRAFT;  Surgeon: Newt Minion, MD;  Location: [facility];  Service: Orthopedics;  Laterality: Right;   I & D EXTREMITY Right 07/03/2021    Procedure: REPEAT RIGHT LEG DEBRIDEMENT;  Surgeon: Newt Minion, MD;  Location: [facility];  Service: Orthopedics;  Laterality: Right;   I & D EXTREMITY Right 07/01/2021   Procedure: RIGHT LEG DEBRIDEMENT;  Surgeon: Newt Minion, MD;  Location: [facility];  Service: Orthopedics;  Laterality: Right;   KNEE ARTHROSCOPY Right YEARS AGO   Social History   Occupational History   Occupation: retired  Tobacco Use   Smoking status: Former    Packs/day: 3.00    Years: 20.00    Pack years: 60.00    Types: Cigarettes    Quit date: 1984    Years since quitting: 39.4   Smokeless tobacco: Never  Vaping Use   Vaping Use: Never used  Substance and Sexual Activity   Alcohol use: Not Currently   Drug use: Never   Sexual activity: Not on file

## 2022-01-04 DIAGNOSIS — L03115 Cellulitis of right lower limb: Secondary | ICD-10-CM | POA: Diagnosis not present

## 2022-01-04 DIAGNOSIS — N179 Acute kidney failure, unspecified: Secondary | ICD-10-CM | POA: Diagnosis not present

## 2022-01-04 DIAGNOSIS — N1832 Chronic kidney disease, stage 3b: Secondary | ICD-10-CM | POA: Diagnosis not present

## 2022-01-05 ENCOUNTER — Ambulatory Visit (INDEPENDENT_AMBULATORY_CARE_PROVIDER_SITE_OTHER): Payer: Medicare Other | Admitting: Orthopedic Surgery

## 2022-01-05 DIAGNOSIS — L97511 Non-pressure chronic ulcer of other part of right foot limited to breakdown of skin: Secondary | ICD-10-CM

## 2022-01-05 NOTE — Progress Notes (Signed)
Office Visit Note   Patient: Robert Dickerson           Date of Birth: 1941/03/11           MRN: [id number] Visit Date: 01/05/2022              Requested by: Lavone Orn, MD [address] [place],  [place] [address] PCP: Lavone Orn, MD  Chief Complaint  Patient presents with   Right Foot - Wound Check      HPI: Patient is a 81 year old gentleman who presents with infection of the right second toe.  He is status post debridement of the right lower extremity with Kerecis tissue graft in December and additional powder applied in the office in May.  Patient is currently on Augmentin.  Assessment & Plan: Visit Diagnoses: No diagnosis found.  Plan: Follow-up on Thursday for evaluation for possible amputation of the second toe.  Follow-Up Instructions: No follow-ups on file.   Ortho Exam  Patient is alert, oriented, no adenopathy, well-dressed, normal affect, normal respiratory effort. Examination patient has an ulcer right foot second toe there is cellulitis and drainage patient has a dopplerable strong dorsalis pedis and posterior tibial pulse that is biphasic with irregular rate consistent with A-fib.  Imaging: No results found. No images are attached to the encounter.  Labs: Lab Results  Component Value Date   HGBA1C 8.2 (H) 08/25/2021   HGBA1C 7.6 (H) 05/19/2021   HGBA1C 9.5 (H) 12/23/2020   LABURIC 6.4 03/04/2011   REPTSTATUS 07/06/2021 FINAL 07/01/2021   GRAMSTAIN NO WBC SEEN NO ORGANISMS SEEN  07/01/2021   CULT  07/01/2021    FEW STAPHYLOCOCCUS AUREUS RARE PROTEUS MIRABILIS NO ANAEROBES ISOLATED Performed at [facility], [place] [address], [place], [place] [address]    LABORGA STAPHYLOCOCCUS AUREUS 07/01/2021   LABORGA PROTEUS MIRABILIS 07/01/2021     Lab Results  Component Value Date   ALBUMIN 2.4 (L) 08/27/2021   ALBUMIN 2.5 (L) 08/25/2021   ALBUMIN 2.6 (L) 06/05/2021    Lab Results  Component Value Date   MG 2.2 08/25/2021    MG 2.2 05/19/2021   MG 2.0 12/23/2020   No results found for: VD25OH  No results found for: PREALBUMIN    Latest Ref Rng & Units 08/28/2021    2:58 AM 08/27/2021    4:26 AM 08/26/2021    6:15 AM  CBC EXTENDED  WBC 4.0 - 10.5 K/uL 7.6   10.2   14.4    RBC 4.22 - 5.81 MIL/uL 3.97   3.98   4.12    Hemoglobin 13.0 - 17.0 g/dL 11.2   11.2   11.7    HCT 39.0 - 52.0 % 33.9   34.4   36.2    Platelets 150 - 400 K/uL 313   329   300       There is no height or weight on file to calculate BMI.  Orders:  No orders of the defined types were placed in this encounter.  No orders of the defined types were placed in this encounter.    Procedures: No procedures performed  Clinical Data: No additional findings.  ROS:  All other systems negative, except as noted in the HPI. Review of Systems  Objective: Vital Signs: There were no vitals taken for this visit.  Specialty Comments:  No specialty comments available.  PMFS History: Patient Active Problem List   Diagnosis Date Noted   Aortic atherosclerosis ([place]) 09/04/2021  Hypercholesterolemia 09/04/2021   Unspecified atrial fibrillation ([place]) 08/25/2021   Acute on chronic diastolic CHF (congestive heart failure) ([place]) 08/25/2021   Wound of right leg, sequela 07/01/2021   Nail, injury by 06/10/2021   Laceration of leg 05/20/2021   Acute respiratory failure with hypoxia ([place]) 05/18/2021   Chronic combined systolic and diastolic heart failure ([place]) 05/18/2021   Chronic kidney disease (CKD), stage IV (severe) ([place]) 05/18/2021   Hypertension associated with diabetes ([place]) 05/18/2021   Insulin dependent type 2 diabetes mellitus ([place]) 05/18/2021   OSA on CPAP 05/18/2021   Laceration of right lower extremity 05/18/2021   Current chronic use of systemic steroids 01/08/2021   Preoperative cardiovascular examination 01/08/2021   Cellulitis 12/23/2020   Benign neoplasm of duodenum, jejunum, 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   Chronic gouty  arthritis 12/03/2020   Chronic kidney disease, stage 4 (severe) ([place]) 12/03/2020   Degeneration of lumbar intervertebral disc 12/03/2020   Type 2 diabetes mellitus with stage 4 chronic kidney disease ([place]) 12/03/2020   Diabetic peripheral neuropathy associated with type 2 diabetes mellitus ([place]) 58/85/0277   Diastolic heart failure ([place]) 12/03/2020   Enlarged prostate 12/03/2020   Erectile dysfunction due to arterial insufficiency 12/03/2020   Essential hypertension 12/03/2020   Gastro-esophageal reflux disease without esophagitis 12/03/2020   Hematuria 12/03/2020   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   Morbid obesity ([place]) 12/03/2020   Osteoarthritis of hip 12/03/2020   Osteoarthritis of knee 12/03/2020   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   Restless legs 12/03/2020   Skin sensation disturbance 12/03/2020   Lumbar stenosis 11/21/2020   Tendinitis 09/03/2020   Subacute arthropathy 09/03/2020   Bilateral hearing loss 08/11/2020   Impacted cerumen of left ear 03/10/2017   Excessive cerumen in left ear canal 03/10/2017   Faintness 12/09/2015   Backache 11/14/2014   CHRONIC PANCREATITIS 06/16/2009   Past Medical History:  Diagnosis Date   Arthritis    all joints   Chronic diastolic CHF (congestive heart failure) (HCC)    Chronic kidney disease (CKD), stage IV (severe) (HCC)    Chronic kidney disease, stage 3 (HCC)    Congestive heart failure with LV diastolic dysfunction, NYHA class 2 ([place])    Family history of adverse reaction to anesthesia    father- change in personaility   GERD (gastroesophageal reflux disease)    Hypertension    Hypertension associated with diabetes ([place])    Hypertensive chronic kidney disease    Insulin dependent type 2 diabetes mellitus ([place])    Neuropathy    OSA on CPAP    Sleep apnea    uses cpap, pt does not know settings    Family History  Problem Relation Age of Onset   Hypertension Mother    Anesthesia problems Neg  Hx    Hypotension Neg Hx    Malignant hyperthermia Neg Hx    Pseudochol deficiency Neg Hx     Past Surgical History:  Procedure Laterality Date   ANTERIOR LAT LUMBAR FUSION Left 01/22/2021   Procedure: Lumbar one-Lumbar two Lateral Lumbar Interbody Fusion;  Surgeon: Vallarie Mare, MD;  Location: [facility];  Service: Neurosurgery;  Laterality: Left;   ANTERIOR LAT LUMBAR FUSION  01/22/2021   APPENDECTOMY     BACK SURGERY     4 surg, lower   CHOLECYSTECTOMY     COLONOSCOPY WITH PROPOFOL N/A 06/11/2014   Procedure: COLONOSCOPY WITH PROPOFOL;  Surgeon: Garlan Fair, MD;  [facility];  Service: Endoscopy;  Laterality: N/A;  ESOPHAGOGASTRODUODENOSCOPY  08/26/2011   Procedure: ESOPHAGOGASTRODUODENOSCOPY (EGD);  Surgeon: Garlan Fair, MD;  [facility];  Service: Endoscopy;  Laterality: N/A;   ESOPHAGOGASTRODUODENOSCOPY (EGD) WITH PROPOFOL N/A 06/11/2014   Procedure: ESOPHAGOGASTRODUODENOSCOPY (EGD) WITH PROPOFOL;  Surgeon: Garlan Fair, MD;  [facility];  Service: Endoscopy;  Laterality: N/A;   EUS  09/15/2011   Procedure: UPPER ENDOSCOPIC ULTRASOUND (EUS) LINEAR;  Surgeon: Landry Dyke, MD;  [facility];  Service: Endoscopy;  Laterality: N/A;  MAC   I & D EXTREMITY Right 05/20/2021   Procedure: IRRIGATION AND DEBRIDEMENT OF LEG AND APPLICATION OF SKIN GRAFT;  Surgeon: Newt Minion, MD;  Location: [facility];  Service: Orthopedics;  Laterality: Right;   I & D EXTREMITY Right 07/03/2021   Procedure: REPEAT RIGHT LEG DEBRIDEMENT;  Surgeon: Newt Minion, MD;  Location: [facility];  Service: Orthopedics;  Laterality: Right;   I & D EXTREMITY Right 07/01/2021   Procedure: RIGHT LEG DEBRIDEMENT;  Surgeon: Newt Minion, MD;  Location: [facility];  Service: Orthopedics;  Laterality: Right;   KNEE ARTHROSCOPY Right YEARS AGO   Social History   Occupational History   Occupation: retired  Tobacco Use   Smoking status: Former    Packs/day: 3.00     Years: 20.00    Pack years: 60.00    Types: Cigarettes    Quit date: 1984    Years since quitting: 39.4   Smokeless tobacco: Never  Vaping Use   Vaping Use: Never used  Substance and Sexual Activity   Alcohol use: Not Currently   Drug use: Never   Sexual activity: Not on file

## 2022-01-07 ENCOUNTER — Ambulatory Visit (INDEPENDENT_AMBULATORY_CARE_PROVIDER_SITE_OTHER): Payer: Medicare Other | Admitting: Orthopedic Surgery

## 2022-01-07 DIAGNOSIS — Z945 Skin transplant status: Secondary | ICD-10-CM
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
## 2022-01-11 DIAGNOSIS — L03115 Cellulitis of right lower limb: Secondary | ICD-10-CM | POA: Diagnosis not present

## 2022-01-11 DIAGNOSIS — I10 Essential (primary) hypertension: Secondary | ICD-10-CM | POA: Diagnosis not present

## 2022-01-11 DIAGNOSIS — N183 Chronic kidney disease, stage 3 unspecified: Secondary | ICD-10-CM | POA: Diagnosis not present
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
## 2022-01-14 ENCOUNTER — Ambulatory Visit (INDEPENDENT_AMBULATORY_CARE_PROVIDER_SITE_OTHER): Payer: Medicare Other | Admitting: Orthopedic Surgery

## 2022-01-14 ENCOUNTER — Encounter: Payer: Self-pay | Admitting: Orthopedic Surgery

## 2022-01-14 DIAGNOSIS — L97511 Non-pressure chronic ulcer of other part of right foot limited to breakdown of skin: Secondary | ICD-10-CM

## 2022-01-14 DIAGNOSIS — S81811D Laceration without foreign body, right lower leg, subsequent encounter: Secondary | ICD-10-CM

## 2022-01-14 DIAGNOSIS — Z945 Skin transplant status: Secondary | ICD-10-CM

## 2022-01-14 NOTE — Progress Notes (Signed)
Office Visit Note   Patient: Robert Dickerson           Date of Birth: [birth year]           MRN: [id number] Visit Date: 01/14/2022              Requested by: Lavone Orn, MD [address] [place],  [place] [address] PCP: Lavone Orn, MD  Chief Complaint  Patient presents with   Right Leg - Follow-up    07/03/22 RLE deb with Kerecis graft 9/79/4801 in office application partial micro       HPI: Patient is a 81 year old gentleman who presents status post debridement of traumatic wound right leg he has been in the knee-high compression socks.  Patient is status post in office application of Kerecis micro powder on May 11.  Patient states he has finished Augmentin today for the right foot second toe.  He states he was having GI symptoms from the antibiotic.  Assessment & Plan: Visit Diagnoses:  1. History of skin graft   2. Laceration of right lower leg, subsequent encounter   3. Toe ulcer, right, limited to breakdown of skin ([place])     Plan: We will hold on the antibiotics secondary to his GI side effects.  Recommend continue compression stocking observation of the second toe.  Follow-Up Instructions: Return in about 4 weeks (around 02/11/2022).   Ortho Exam  Patient is alert, oriented, no adenopathy, well-dressed, normal affect, normal respiratory effort. Examination the right leg wound has fibrinous tissue with superficial epithelialization around the wound edges.  Wound currently measures 40 x 65 mm is flat and healthy.  Examination of the right foot second toe there is redness and some swelling but no open ulcers no drainage no ascending cellulitis.  Imaging: No results found.    Labs: Lab Results  Component Value Date   HGBA1C 8.2 (H) 08/25/2021   HGBA1C 7.6 (H) 05/19/2021   HGBA1C 9.5 (H) 12/23/2020   LABURIC 6.4 03/04/2011   REPTSTATUS 07/06/2021 FINAL 07/01/2021   GRAMSTAIN NO WBC SEEN NO ORGANISMS SEEN  07/01/2021   CULT  07/01/2021    FEW  STAPHYLOCOCCUS AUREUS RARE PROTEUS MIRABILIS NO ANAEROBES ISOLATED Performed at [facility], [place] [address] [place], [place] [address]    [place] 07/01/2021   LABORGA PROTEUS MIRABILIS 07/01/2021     Lab Results  Component Value Date   ALBUMIN 2.4 (L) 08/27/2021   ALBUMIN 2.5 (L) 08/25/2021   ALBUMIN 2.6 (L) 06/05/2021    Lab Results  Component Value Date   MG 2.2 08/25/2021   MG 2.2 05/19/2021   MG 2.0 12/23/2020   No results found for: "VD25OH"  No results found for: "PREALBUMIN"    Latest Ref Rng & Units 08/28/2021    2:58 AM 08/27/2021    4:26 AM 08/26/2021    6:15 AM  CBC EXTENDED  WBC 4.0 - 10.5 K/uL 7.6  10.2  14.4   RBC 4.22 - 5.81 MIL/uL 3.97  3.98  4.12   Hemoglobin 13.0 - 17.0 g/dL 11.2  11.2  11.7   HCT 39.0 - 52.0 % 33.9  34.4  36.2   Platelets 150 - 400 K/uL 313  329  300      There is no height or weight on file to calculate BMI.  Orders:  No orders of the defined types were placed in this encounter.  No orders of the defined types were placed in this  encounter.    Procedures: No procedures performed  Clinical Data: No additional findings.  ROS:  All other systems negative, except as noted in the HPI. Review of Systems  Objective: Vital Signs: There were no vitals taken for this visit.  Specialty Comments:  No specialty comments available.  PMFS History: Patient Active Problem List   Diagnosis Date Noted   Aortic atherosclerosis ([place]) 09/04/2021   Hypercholesterolemia 09/04/2021   Unspecified atrial fibrillation ([place]) 08/25/2021   Acute on chronic diastolic CHF (congestive heart failure) ([place]) 08/25/2021   Wound of right leg, sequela 07/01/2021   Nail, injury by 06/10/2021   Laceration of leg 05/20/2021   Acute respiratory failure with hypoxia ([place]) 05/18/2021   Chronic combined systolic and diastolic heart failure ([place]) 05/18/2021   Chronic kidney disease (CKD), stage IV (severe) ([place]) 05/18/2021    Hypertension associated with diabetes ([place]) 05/18/2021   Insulin dependent type 2 diabetes mellitus ([place]) 05/18/2021   OSA on CPAP 05/18/2021   Laceration of right lower extremity 05/18/2021   Current chronic use of systemic steroids 01/08/2021   Preoperative cardiovascular examination 01/08/2021   Cellulitis 12/23/2020   Benign neoplasm of duodenum, jejunum, 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   Chronic kidney disease, stage 4 (severe) ([place]) 12/03/2020   Degeneration of lumbar intervertebral disc 12/03/2020   Type 2 diabetes mellitus with stage 4 chronic kidney disease ([place]) 12/03/2020   Diabetic peripheral neuropathy associated with type 2 diabetes mellitus ([place]) 35/57/3220   Diastolic heart failure ([place]) 12/03/2020   Enlarged prostate 12/03/2020   Erectile dysfunction due to arterial insufficiency 12/03/2020   Essential hypertension 12/03/2020   Gastro-esophageal reflux disease without esophagitis 12/03/2020   Hematuria 12/03/2020   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   Morbid obesity ([place]) 12/03/2020   Osteoarthritis of hip 12/03/2020   Osteoarthritis of knee 12/03/2020   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/03/2020   Restless legs 12/03/2020   Skin sensation disturbance 12/03/2020   Lumbar stenosis 11/21/2020   Tendinitis 09/03/2020   Subacute arthropathy 09/03/2020   Bilateral hearing loss 08/11/2020   Impacted cerumen of left ear 03/10/2017   Excessive cerumen in left ear canal 03/10/2017   Faintness 12/09/2015   Backache 11/14/2014   CHRONIC PANCREATITIS 06/16/2009   Past Medical History:  Diagnosis Date   Arthritis    all joints   Chronic diastolic CHF (congestive heart failure) (HCC)    Chronic kidney disease (CKD), stage IV (severe) (HCC)    Chronic kidney disease, stage 3 (HCC)    Congestive heart failure with LV diastolic dysfunction, NYHA class 2 ([place])    Family history of adverse reaction to anesthesia    father-  change in personaility   GERD (gastroesophageal reflux disease)    Hypertension    Hypertension associated with diabetes ([place])    Hypertensive chronic kidney disease    Insulin dependent type 2 diabetes mellitus ([place])    Neuropathy    OSA on CPAP    Sleep apnea    uses cpap, pt does not know settings    Family History  Problem Relation Age of Onset   Hypertension Mother    Anesthesia problems Neg Hx    Hypotension Neg Hx    Malignant hyperthermia Neg Hx    Pseudochol deficiency Neg Hx     Past Surgical History:  Procedure Laterality Date   ANTERIOR LAT LUMBAR FUSION Left 01/22/2021   Procedure: Lumbar one-Lumbar two Lateral Lumbar Interbody Fusion;  Surgeon: Vallarie Mare, MD;  Location: [facility];  Service: Neurosurgery;  Laterality: Left;   ANTERIOR LAT LUMBAR FUSION  01/22/2021   APPENDECTOMY     BACK SURGERY     4 surg, lower   CHOLECYSTECTOMY     COLONOSCOPY WITH PROPOFOL N/A 06/11/2014   Procedure: COLONOSCOPY WITH PROPOFOL;  Surgeon: Garlan Fair, MD;  [facility];  Service: Endoscopy;  Laterality: N/A;   ESOPHAGOGASTRODUODENOSCOPY  08/26/2011   Procedure: ESOPHAGOGASTRODUODENOSCOPY (EGD);  Surgeon: Garlan Fair, MD;  [facility];  Service: Endoscopy;  Laterality: N/A;   ESOPHAGOGASTRODUODENOSCOPY (EGD) WITH PROPOFOL N/A 06/11/2014   Procedure: ESOPHAGOGASTRODUODENOSCOPY (EGD) WITH PROPOFOL;  Surgeon: Garlan Fair, MD;  [facility];  Service: Endoscopy;  Laterality: N/A;   EUS  09/15/2011   Procedure: UPPER ENDOSCOPIC ULTRASOUND (EUS) LINEAR;  Surgeon: Landry Dyke, MD;  [facility];  Service: Endoscopy;  Laterality: N/A;  MAC   I & D EXTREMITY Right 05/20/2021   Procedure: IRRIGATION AND DEBRIDEMENT OF LEG AND APPLICATION OF SKIN GRAFT;  Surgeon: Newt Minion, MD;  Location: [facility];  Service: Orthopedics;  Laterality: Right;   I & D EXTREMITY Right 07/03/2021   Procedure: REPEAT RIGHT LEG DEBRIDEMENT;  Surgeon:  Newt Minion, MD;  Location: [facility];  Service: Orthopedics;  Laterality: Right;   I & D EXTREMITY Right 07/01/2021   Procedure: RIGHT LEG DEBRIDEMENT;  Surgeon: Newt Minion, MD;  Location: [facility];  Service: Orthopedics;  Laterality: Right;   KNEE ARTHROSCOPY Right YEARS AGO   Social History   Occupational History   Occupation: retired  Tobacco Use   Smoking status: Former    Packs/day: 3.00    Years: 20.00    Total pack years: 60.00    Types: Cigarettes    Quit date: 1984    Years since quitting: 39.4   Smokeless tobacco: Never  Vaping Use   Vaping Use: Never used  Substance and Sexual Activity   Alcohol use: Not Currently   Drug use: Never   Sexual activity: Not on file

## 2022-01-16 ENCOUNTER — Encounter: Payer: Self-pay | Admitting: Orthopedic Surgery

## 2022-01-16 NOTE — Progress Notes (Signed)
Office Visit Note   Patient: Robert Dickerson           Date of Birth: [birth year]           MRN: [id number] Visit Date: 01/07/2022              Requested by: Lavone Orn, MD [address] [place],  [place] [address] PCP: Lavone Orn, MD  Chief Complaint  Patient presents with   Right Foot - Wound Check      HPI: Patient is a 81 year old gentleman who presents in follow-up for right foot second toe ulcer.  He is status post right leg debridement in December last year.  Patient is currently in sandals without a dressing.  Assessment & Plan: Visit Diagnoses:  1. History of skin graft     Plan: Recommended routine wound care for the right calf venous stasis insufficiency.  Recommended continuing the antibiotics and dry dressing change.  Follow-Up Instructions: Return in about 1 week (around 01/14/2022).   Ortho Exam  Patient is alert, oriented, no adenopathy, well-dressed, normal affect, normal respiratory effort. Examination patient has a ulcer beneath the fourth metatarsal head left foot.  The ulcer is completely healed.  The right second toe ulcer is red and swollen he has a strong dorsalis pedis pulse there is no redness proximal to the MTP joint.  There is no drainage.  The venous stasis ulcer on the right calf is stable.  Imaging: No results found. No images are attached to the encounter.  Labs: Lab Results  Component Value Date   HGBA1C 8.2 (H) 08/25/2021   HGBA1C 7.6 (H) 05/19/2021   HGBA1C 9.5 (H) 12/23/2020   LABURIC 6.4 03/04/2011   REPTSTATUS 07/06/2021 FINAL 07/01/2021   GRAMSTAIN NO WBC SEEN NO ORGANISMS SEEN  07/01/2021   CULT  07/01/2021    FEW STAPHYLOCOCCUS AUREUS RARE PROTEUS MIRABILIS NO ANAEROBES ISOLATED Performed at [facility], [place] [address], [place], [place] [address]    [place] 07/01/2021   LABORGA PROTEUS MIRABILIS 07/01/2021     Lab Results  Component Value Date   ALBUMIN 2.4 (L)  08/27/2021   ALBUMIN 2.5 (L) 08/25/2021   ALBUMIN 2.6 (L) 06/05/2021    Lab Results  Component Value Date   MG 2.2 08/25/2021   MG 2.2 05/19/2021   MG 2.0 12/23/2020   No results found for: "VD25OH"  No results found for: "PREALBUMIN"    Latest Ref Rng & Units 08/28/2021    2:58 AM 08/27/2021    4:26 AM 08/26/2021    6:15 AM  CBC EXTENDED  WBC 4.0 - 10.5 K/uL 7.6  10.2  14.4   RBC 4.22 - 5.81 MIL/uL 3.97  3.98  4.12   Hemoglobin 13.0 - 17.0 g/dL 11.2  11.2  11.7   HCT 39.0 - 52.0 % 33.9  34.4  36.2   Platelets 150 - 400 K/uL 313  329  300      There is no height or weight on file to calculate BMI.  Orders:  No orders of the defined types were placed in this encounter.  No orders of the defined types were placed in this encounter.    Procedures: No procedures performed  Clinical Data: No additional findings.  ROS:  All other systems negative, except as noted in the HPI. Review of Systems  Objective: Vital Signs: There were no vitals taken for this visit.  Specialty Comments:  No specialty comments available.  PMFS History: Patient Active Problem List   Diagnosis Date Noted   Aortic atherosclerosis ([place]) 09/04/2021   Hypercholesterolemia 09/04/2021   Unspecified atrial fibrillation ([place]) 08/25/2021   Acute on chronic diastolic CHF (congestive heart failure) ([place]) 08/25/2021   Wound of right leg, sequela 07/01/2021   Nail, injury by 06/10/2021   Laceration of leg 05/20/2021   Acute respiratory failure with hypoxia ([place]) 05/18/2021   Chronic combined systolic and diastolic heart failure ([place]) 05/18/2021   Chronic kidney disease (CKD), stage IV (severe) ([place]) 05/18/2021   Hypertension associated with diabetes ([place]) 05/18/2021   Insulin dependent type 2 diabetes mellitus ([place]) 05/18/2021   OSA on CPAP 05/18/2021   Laceration of right lower extremity 05/18/2021   Current chronic use of systemic steroids 01/08/2021   Preoperative cardiovascular examination  01/08/2021   Cellulitis 12/23/2020   Benign neoplasm of duodenum, jejunum, 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   Chronic kidney disease, stage 4 (severe) ([place]) 12/03/2020   Degeneration of lumbar intervertebral disc 12/03/2020   Type 2 diabetes mellitus with stage 4 chronic kidney disease ([place]) 12/03/2020   Diabetic peripheral neuropathy associated with type 2 diabetes mellitus ([place]) 76/81/1572   Diastolic heart failure ([place]) 12/03/2020   Enlarged prostate 12/03/2020   Erectile dysfunction due to arterial insufficiency 12/03/2020   Essential hypertension 12/03/2020   Gastro-esophageal reflux disease without esophagitis 12/03/2020   Hematuria 12/03/2020   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   Morbid obesity ([place]) 12/03/2020   Osteoarthritis of hip 12/03/2020   Osteoarthritis of knee 12/03/2020   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   Restless legs 12/03/2020   Skin sensation disturbance 12/03/2020   Lumbar stenosis 11/21/2020   Tendinitis 09/03/2020   Subacute arthropathy 09/03/2020   Bilateral hearing loss 08/11/2020   Impacted cerumen of left ear 03/10/2017   Excessive cerumen in left ear canal 03/10/2017   Faintness 12/09/2015   Backache 11/14/2014   CHRONIC PANCREATITIS 06/16/2009   Past Medical History:  Diagnosis Date   Arthritis    all joints   Chronic diastolic CHF (congestive heart failure) (HCC)    Chronic kidney disease (CKD), stage IV (severe) (HCC)    Chronic kidney disease, stage 3 (HCC)    Congestive heart failure with LV diastolic dysfunction, NYHA class 2 ([place])    Family history of adverse reaction to anesthesia    father- change in personaility   GERD (gastroesophageal reflux disease)    Hypertension    Hypertension associated with diabetes ([place])    Hypertensive chronic kidney disease    Insulin dependent type 2 diabetes mellitus ([place])    Neuropathy    OSA on CPAP    Sleep apnea    uses cpap, pt does not  know settings    Family History  Problem Relation Age of Onset   Hypertension Mother    Anesthesia problems Neg Hx    Hypotension Neg Hx    Malignant hyperthermia Neg Hx    Pseudochol deficiency Neg Hx     Past Surgical History:  Procedure Laterality Date   ANTERIOR LAT LUMBAR FUSION Left 01/22/2021   Procedure: Lumbar one-Lumbar two Lateral Lumbar Interbody Fusion;  Surgeon: Vallarie Mare, MD;  Location: [facility];  Service: Neurosurgery;  Laterality: Left;   ANTERIOR LAT LUMBAR FUSION  01/22/2021   APPENDECTOMY     BACK SURGERY     4 surg, lower   CHOLECYSTECTOMY     COLONOSCOPY WITH PROPOFOL N/A 06/11/2014   Procedure: COLONOSCOPY WITH  PROPOFOL;  Surgeon: Garlan Fair, MD;  [facility];  Service: Endoscopy;  Laterality: N/A;   ESOPHAGOGASTRODUODENOSCOPY  08/26/2011   Procedure: ESOPHAGOGASTRODUODENOSCOPY (EGD);  Surgeon: Garlan Fair, MD;  [facility];  Service: Endoscopy;  Laterality: N/A;   ESOPHAGOGASTRODUODENOSCOPY (EGD) WITH PROPOFOL N/A 06/11/2014   Procedure: ESOPHAGOGASTRODUODENOSCOPY (EGD) WITH PROPOFOL;  Surgeon: Garlan Fair, MD;  [facility];  Service: Endoscopy;  Laterality: N/A;   EUS  09/15/2011   Procedure: UPPER ENDOSCOPIC ULTRASOUND (EUS) LINEAR;  Surgeon: Landry Dyke, MD;  [facility];  Service: Endoscopy;  Laterality: N/A;  MAC   I & D EXTREMITY Right 05/20/2021   Procedure: IRRIGATION AND DEBRIDEMENT OF LEG AND APPLICATION OF SKIN GRAFT;  Surgeon: Newt Minion, MD;  Location: [facility];  Service: Orthopedics;  Laterality: Right;   I & D EXTREMITY Right 07/03/2021   Procedure: REPEAT RIGHT LEG DEBRIDEMENT;  Surgeon: Newt Minion, MD;  Location: [facility];  Service: Orthopedics;  Laterality: Right;   I & D EXTREMITY Right 07/01/2021   Procedure: RIGHT LEG DEBRIDEMENT;  Surgeon: Newt Minion, MD;  Location: [facility];  Service: Orthopedics;  Laterality: Right;   KNEE ARTHROSCOPY Right YEARS AGO   Social  History   Occupational History   Occupation: retired  Tobacco Use   Smoking status: Former    Packs/day: 3.00    Years: 20.00    Total pack years: 60.00    Types: Cigarettes    Quit date: 1984    Years since quitting: 39.4   Smokeless tobacco: Never  Vaping Use   Vaping Use: Never used  Substance and Sexual Activity   Alcohol use: Not Currently   Drug use: Never   Sexual activity: Not on file

## 2022-01-18 DIAGNOSIS — N1831 Chronic kidney disease, stage 3a: Secondary | ICD-10-CM | POA: Diagnosis not present

## 2022-01-19 ENCOUNTER — Encounter: Payer: Self-pay | Admitting: Orthopedic Surgery

## 2022-01-25 ENCOUNTER — Ambulatory Visit
Admission: RE | Admit: 2022-01-25 | Discharge: 2022-01-25 | Disposition: A | Discharge status: Home / Self Care | Payer: Medicare Other | Source: Ambulatory Visit | Attending: Internal Medicine | Admitting: Internal Medicine

## 2022-01-25 ENCOUNTER — Ambulatory Visit (INDEPENDENT_AMBULATORY_CARE_PROVIDER_SITE_OTHER): Payer: Medicare Other | Admitting: Podiatry

## 2022-01-25 ENCOUNTER — Other Ambulatory Visit: Payer: Self-pay | Admitting: Internal Medicine

## 2022-01-25 ENCOUNTER — Encounter: Payer: Self-pay | Admitting: Podiatry

## 2022-01-25 DIAGNOSIS — E11621 Type 2 diabetes mellitus with foot ulcer: Secondary | ICD-10-CM | POA: Diagnosis not present

## 2022-01-25 DIAGNOSIS — E876 Hypokalemia: Secondary | ICD-10-CM | POA: Diagnosis not present

## 2022-01-25 DIAGNOSIS — L97519 Non-pressure chronic ulcer of other part of right foot with unspecified severity: Secondary | ICD-10-CM | POA: Diagnosis not present

## 2022-01-25 DIAGNOSIS — N184 Chronic kidney disease, stage 4 (severe): Secondary | ICD-10-CM | POA: Diagnosis not present

## 2022-01-25 DIAGNOSIS — L97516 Non-pressure chronic ulcer of other part of right foot with bone involvement without evidence of necrosis: Secondary | ICD-10-CM

## 2022-01-25 DIAGNOSIS — E1142 Type 2 diabetes mellitus with diabetic polyneuropathy: Secondary | ICD-10-CM

## 2022-01-25 DIAGNOSIS — M7989 Other specified soft tissue disorders: Secondary | ICD-10-CM | POA: Diagnosis not present

## 2022-01-25 DIAGNOSIS — L84 Corns and callosities: Secondary | ICD-10-CM

## 2022-01-25 NOTE — Progress Notes (Signed)
This patient presents to the office for a second opinion on his second toe right foot.  He says it became infected and his medical doctor treated him with antibiotics and the infection resolved.  His doctor recommended he be evaluated in this office for complete healing.  He says pain has subsided.  He presents for evaluation and treated.  General Appearance  Alert, conversant and in no acute stress.  Vascular  Dorsalis pedis and posterior tibial  pulses are palpable  bilaterally.  Capillary return is within normal limits  bilaterally. Temperature is within normal limits  bilaterally.  Neurologic  Senn-Weinstein monofilament wire test diminished   bilaterally. Muscle power within normal limits bilaterally.  Nails Thick disfigured discolored nails with subungual debris  from hallux to fifth toes bilaterally. No evidence of bacterial infection or drainage bilaterally.  Orthopedic  No limitations of motion  feet .  No crepitus or effusions noted.  No bony pathology or digital deformities noted. Healing of skin noted at the distal aspect second toe right foot.  No redness, swelling or drainage.  Skin  normotropic skin with no porokeratosis noted bilaterally.  No signs of infections or ulcers noted.   Callus sub 4th met left foot.  ROV.  Examined infection site and the toe has healed with normal skin formation. Padding dispensed to protect his previously infected toe.  RTC 6 weeks.   Helane Gunther DPM

## 2022-01-28 ENCOUNTER — Ambulatory Visit (INDEPENDENT_AMBULATORY_CARE_PROVIDER_SITE_OTHER): Payer: Medicare Other | Admitting: Orthopedic Surgery

## 2022-01-28 ENCOUNTER — Ambulatory Visit: Payer: Medicare Other | Admitting: Orthopedic Surgery

## 2022-01-28 DIAGNOSIS — Z945 Skin transplant status: Secondary | ICD-10-CM | POA: Diagnosis not present

## 2022-01-28 DIAGNOSIS — S81811D Laceration without foreign body, right lower leg, subsequent encounter: Secondary | ICD-10-CM | POA: Diagnosis not present

## 2022-01-29 ENCOUNTER — Encounter: Payer: Self-pay | Admitting: Orthopedic Surgery

## 2022-01-29 NOTE — Progress Notes (Signed)
Office Visit Note   Patient: Robert Dickerson           Date of Birth: 03-Feb-1941           MRN: [id number] Visit Date: 01/28/2022              Requested by: Lavone Orn, MD [facility] [address] [employer] [address],  [place] [address] PCP: Lavone Orn, MD  Chief Complaint  Patient presents with   Right Leg - Follow-up    07/03/21 RLE deb w/ kerecis 12/10/21 in office micro 01/28/22 38 cm sq micro in office application      HPI: Patient is a 81 year old gentleman who is seen in the office for a right lower extremity chronic wound.  Plan for application of Kerecis tissue graft 38 cm.  Assessment & Plan: Visit Diagnoses:  1. History of skin graft     Plan: The tissue graft was applied a sterile dressing was applied patient may begin dressing changes on Monday morning and once the tissue graft is stabilized he may resume using his compression sock.  Follow-Up Instructions: Return in about 3 weeks (around 02/18/2022).   Ortho Exam  Patient is alert, oriented, no adenopathy, well-dressed, normal affect, normal respiratory effort. Examination the wound bed has healthy granulation tissue.  After informed consent the wound bed was debrided of skin and soft tissue and fibrinous tissue after debridement the wound was 6 x 7 cm.  The Kerecis 38 cm tissue graft was applied covered with a sterile dressing.  Imaging: No results found.    Labs: Lab Results  Component Value Date   HGBA1C 8.2 (H) 08/25/2021   HGBA1C 7.6 (H) 05/19/2021   HGBA1C 9.5 (H) 12/23/2020   LABURIC 6.4 03/04/2011   REPTSTATUS 07/06/2021 FINAL 07/01/2021   GRAMSTAIN NO WBC SEEN NO ORGANISMS SEEN  07/01/2021   CULT  07/01/2021    FEW STAPHYLOCOCCUS AUREUS RARE PROTEUS MIRABILIS NO ANAEROBES ISOLATED Performed at [facility], [place] [address], [place], [place] [address]    [place] 07/01/2021   LABORGA PROTEUS MIRABILIS 07/01/2021     Lab Results  Component Value Date   ALBUMIN  2.4 (L) 08/27/2021   ALBUMIN 2.5 (L) 08/25/2021   ALBUMIN 2.6 (L) 06/05/2021    Lab Results  Component Value Date   MG 2.2 08/25/2021   MG 2.2 05/19/2021   MG 2.0 12/23/2020   No results found for: "VD25OH"  No results found for: "PREALBUMIN"    Latest Ref Rng & Units 08/28/2021    2:58 AM 08/27/2021    4:26 AM 08/26/2021    6:15 AM  CBC EXTENDED  WBC 4.0 - 10.5 K/uL 7.6  10.2  14.4   RBC 4.22 - 5.81 MIL/uL 3.97  3.98  4.12   Hemoglobin 13.0 - 17.0 g/dL 11.2  11.2  11.7   HCT 39.0 - 52.0 % 33.9  34.4  36.2   Platelets 150 - 400 K/uL 313  329  300      There is no height or weight on file to calculate BMI.  Orders:  No orders of the defined types were placed in this encounter.  No orders of the defined types were placed in this encounter.    Procedures: No procedures performed  Clinical Data: No additional findings.  ROS:  All other systems negative, except as noted in the HPI. Review of Systems  Objective: Vital Signs: There were no vitals taken for this visit.  Specialty Comments:  No specialty comments available.  PMFS History: Patient Active Problem List   Diagnosis Date Noted   Aortic atherosclerosis ([place]) 09/04/2021   Hypercholesterolemia 09/04/2021   Unspecified atrial fibrillation ([place]) 08/25/2021   Acute on chronic diastolic CHF (congestive heart failure) ([place]) 08/25/2021   Wound of right leg, sequela 07/01/2021   Nail, injury by 06/10/2021   Laceration of leg 05/20/2021   Acute respiratory failure with hypoxia ([place]) 05/18/2021   Chronic combined systolic and diastolic heart failure ([place]) 05/18/2021   Chronic kidney disease (CKD), stage IV (severe) ([place]) 05/18/2021   Hypertension associated with diabetes ([place]) 05/18/2021   Insulin dependent type 2 diabetes mellitus ([place]) 05/18/2021   OSA on CPAP 05/18/2021   Laceration of right lower extremity 05/18/2021   Current chronic use of systemic steroids 01/08/2021   Preoperative cardiovascular  examination 01/08/2021   Cellulitis 12/23/2020   Benign neoplasm of duodenum, jejunum, 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   Chronic kidney disease, stage 4 (severe) ([place]) 12/03/2020   Degeneration of lumbar intervertebral disc 12/03/2020   Type 2 diabetes mellitus with stage 4 chronic kidney disease ([place]) 12/03/2020   Diabetic peripheral neuropathy associated with type 2 diabetes mellitus ([place]) 12/45/8099   Diastolic heart failure ([place]) 12/03/2020   Enlarged prostate 12/03/2020   Erectile dysfunction due to arterial insufficiency 12/03/2020   Essential hypertension 12/03/2020   Gastro-esophageal reflux disease without esophagitis 12/03/2020   Hematuria 12/03/2020   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   Morbid obesity ([place]) 12/03/2020   Osteoarthritis of hip 12/03/2020   Osteoarthritis of knee 12/03/2020   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/03/2020   Restless legs 12/03/2020   Skin sensation disturbance 12/03/2020   Lumbar stenosis 11/21/2020   Tendinitis 09/03/2020   Subacute arthropathy 09/03/2020   Bilateral hearing loss 08/11/2020   Impacted cerumen of left ear 03/10/2017   Excessive cerumen in left ear canal 03/10/2017   Faintness 12/09/2015   Backache 11/14/2014   CHRONIC PANCREATITIS 06/16/2009   Past Medical History:  Diagnosis Date   Arthritis    all joints   Chronic diastolic CHF (congestive heart failure) (HCC)    Chronic kidney disease (CKD), stage IV (severe) (HCC)    Chronic kidney disease, stage 3 (HCC)    Congestive heart failure with LV diastolic dysfunction, NYHA class 2 ([place])    Family history of adverse reaction to anesthesia    father- change in personaility   GERD (gastroesophageal reflux disease)    Hypertension    Hypertension associated with diabetes ([place])    Hypertensive chronic kidney disease    Insulin dependent type 2 diabetes mellitus ([place])    Neuropathy    OSA on CPAP    Sleep apnea    uses cpap,  pt does not know settings    Family History  Problem Relation Age of Onset   Hypertension Mother    Anesthesia problems Neg Hx    Hypotension Neg Hx    Malignant hyperthermia Neg Hx    Pseudochol deficiency Neg Hx     Past Surgical History:  Procedure Laterality Date   ANTERIOR LAT LUMBAR FUSION Left 01/22/2021   Procedure: Lumbar one-Lumbar two Lateral Lumbar Interbody Fusion;  Surgeon: Vallarie Mare, MD;  Location: [facility];  Service: Neurosurgery;  Laterality: Left;   ANTERIOR LAT LUMBAR FUSION  01/22/2021   APPENDECTOMY     BACK SURGERY     4 surg, lower   CHOLECYSTECTOMY     COLONOSCOPY WITH PROPOFOL N/A 06/11/2014  Procedure: COLONOSCOPY WITH PROPOFOL;  Surgeon: Garlan Fair, MD;  [facility];  Service: Endoscopy;  Laterality: N/A;   ESOPHAGOGASTRODUODENOSCOPY  08/26/2011   Procedure: ESOPHAGOGASTRODUODENOSCOPY (EGD);  Surgeon: Garlan Fair, MD;  [facility];  Service: Endoscopy;  Laterality: N/A;   ESOPHAGOGASTRODUODENOSCOPY (EGD) WITH PROPOFOL N/A 06/11/2014   Procedure: ESOPHAGOGASTRODUODENOSCOPY (EGD) WITH PROPOFOL;  Surgeon: Garlan Fair, MD;  [facility];  Service: Endoscopy;  Laterality: N/A;   EUS  09/15/2011   Procedure: UPPER ENDOSCOPIC ULTRASOUND (EUS) LINEAR;  Surgeon: Landry Dyke, MD;  [facility];  Service: Endoscopy;  Laterality: N/A;  MAC   I & D EXTREMITY Right 05/20/2021   Procedure: IRRIGATION AND DEBRIDEMENT OF LEG AND APPLICATION OF SKIN GRAFT;  Surgeon: Newt Minion, MD;  Location: [facility];  Service: Orthopedics;  Laterality: Right;   I & D EXTREMITY Right 07/03/2021   Procedure: REPEAT RIGHT LEG DEBRIDEMENT;  Surgeon: Newt Minion, MD;  Location: [facility];  Service: Orthopedics;  Laterality: Right;   I & D EXTREMITY Right 07/01/2021   Procedure: RIGHT LEG DEBRIDEMENT;  Surgeon: Newt Minion, MD;  Location: [facility];  Service: Orthopedics;  Laterality: Right;   KNEE ARTHROSCOPY Right YEARS AGO    Social History   Occupational History   Occupation: retired  Tobacco Use   Smoking status: Former    Packs/day: 3.00    Years: 20.00    Total pack years: 60.00    Types: Cigarettes    Quit date: 1984    Years since quitting: 39.5   Smokeless tobacco: Never  Vaping Use   Vaping Use: Never used  Substance and Sexual Activity   Alcohol use: Not Currently   Drug use: Never   Sexual activity: Not on file

## 2022-02-04 DIAGNOSIS — N184 Chronic kidney disease, stage 4 (severe): Secondary | ICD-10-CM | POA: Diagnosis not present

## 2022-02-04 DIAGNOSIS — L97919 Non-pressure chronic ulcer of unspecified part of right lower leg with unspecified severity: Secondary | ICD-10-CM | POA: Diagnosis not present

## 2022-02-04 DIAGNOSIS — I5020 Unspecified systolic (congestive) heart failure: Secondary | ICD-10-CM | POA: Diagnosis not present

## 2022-02-05 ENCOUNTER — Other Ambulatory Visit: Payer: Self-pay | Admitting: Family

## 2022-02-08 ENCOUNTER — Telehealth: Payer: Self-pay | Admitting: Orthopedic Surgery

## 2022-02-08 ENCOUNTER — Other Ambulatory Visit: Payer: Self-pay | Admitting: Family

## 2022-02-08 DIAGNOSIS — L03031 Cellulitis of right toe: Secondary | ICD-10-CM | POA: Diagnosis not present

## 2022-02-08 MED ORDER — CLOBETASOL PROPIONATE 0.05 % EX CREA: TOPICAL_CREAM | Freq: Two times a day (BID) | CUTANEOUS | 0 refills | Status: DC

## 2022-02-08 NOTE — Telephone Encounter (Signed)
[facility] taken off [facility].

## 2022-02-08 NOTE — Telephone Encounter (Signed)
He told me earlier that he would pick it up at [facility] today. [facility]

## 2022-02-08 NOTE — Telephone Encounter (Signed)
Pt called requesting a call back and also asking a refill to be sent to his new pharmacy. Script refill needed clobetasol cream. Please call pt also about possible infection in right toe at [phone].

## 2022-02-08 NOTE — Telephone Encounter (Signed)
Patient called in stating he needs his medication that was called in earlier [facility]asol [facility] [address], [place], [place] [address] also he would like the preferred pharmacy to be switched [facility] and take [facility] out.

## 2022-02-08 NOTE — Telephone Encounter (Signed)
SW pt, he is seeing Dr. Koleen Nimrod about his toe, since he couldn't get in to see Dr. Sharol Given this week. He [facility].

## 2022-02-10 ENCOUNTER — Ambulatory Visit: Payer: Medicare Other | Admitting: Family

## 2022-02-15 DIAGNOSIS — L03031 Cellulitis of right toe: Secondary | ICD-10-CM | POA: Diagnosis not present

## 2022-02-17 ENCOUNTER — Telehealth: Payer: Self-pay

## 2022-02-17 NOTE — Telephone Encounter (Signed)
Pt will be in 02/22/2022 for wound eval f/u in office kerecis application        Previous Messages    ----- Message -----  From: Pamella Pert, RMA  Sent: 01/26/2022  10:25 AM EDT  To: Pamella Pert, RMA  Subject: RE: benifits investigation                     Pt has an appt sch for Thursday 01/28/22 for graft  ----- Message -----  From: Pamella Pert, RMA  Sent: 01/25/2022  11:06 AM EDT  To: Pamella Pert, RMA  Subject: RE: benifits investigation                     Email sent to Tim to question billing for graft. Pt will receive the 38 sq cm of micro. Product code for office rate is [phone] ( not sure if this will be different bc this was not ordered through [facility] but order through [facility]?) but using that math the Q4158 code is $154.91 this will be multiplied by 38 billable units to equal $5,886.58 and then adding CPT code 15271 which is $155.88 total billed would be $6,042.48? At which the pts OOP deductible has been met and insurance coverers at 100% ( medicare B covered at 80% and AARP covers at 20% as per benefits investigation by [employer].  ----- Message -----  From: Pamella Pert, RMA  Sent: 01/19/2022   4:26 PM EDT  To: Pamella Pert, RMA  Subject: RE: benifits investigation                     This has been ordered through [facility] today 01/19/22 and will hold message pending arrival of product. Pt currently has follow up sch for 02/10/22 but will move this up to next week for in office application once we have it here.  ----- Message -----  From: Pamella Pert, RMA  Sent: 01/08/2022   2:33 PM EDT  To: Pamella Pert, RMA  Subject: RE: benifits investigation                     Email to [place] about ordering through [place] and getting numbers. Will hold this message pending response.  ----- Message -----  From: Pamella Pert, RMA  Sent: 01/06/2022   1:54 PM EDT  To: Pamella Pert, RMA  Subject: RE: benifits investigation                     Pt  will have no out of pocket expense his deductible has already been met. Meeting with Octavia Bruckner today to discuss if we order through [facility] and what the billing will look like for insurance reimbursement  ----- Message -----  From: Pamella Pert, RMA  Sent: 01/01/2022  10:17 AM EDT  To: Pamella Pert, RMA  Subject: RE: benifits investigation                     BI has been entered through [facility] and Octavia Bruckner is aware will await email to confirm benefits and then discuss in office application, purchase and payment.  ----- Message -----  From: Pamella Pert, RMA  Sent: 12/31/2021   4:20 PM EDT  To: Pamella Pert, RMA  Subject: benifits investigation  Run BI for kerecis in office graft

## 2022-02-22 ENCOUNTER — Ambulatory Visit (INDEPENDENT_AMBULATORY_CARE_PROVIDER_SITE_OTHER): Payer: Medicare Other | Admitting: Orthopedic Surgery

## 2022-02-22 ENCOUNTER — Encounter: Payer: Self-pay | Admitting: Orthopedic Surgery

## 2022-02-22 DIAGNOSIS — S81811D Laceration without foreign body, right lower leg, subsequent encounter: Secondary | ICD-10-CM | POA: Diagnosis not present

## 2022-02-22 DIAGNOSIS — Z945 Skin transplant status: Secondary | ICD-10-CM

## 2022-02-22 NOTE — Progress Notes (Signed)
Office Visit Note   Patient: Robert Dickerson           Date of Birth: [birth year]           MRN: [id number] Visit Date: 02/22/2022              Requested by: Lavone Orn, MD [address] [place],  [place] [address] PCP: Lavone Orn, MD  Chief Complaint  Patient presents with   Right Leg - Follow-up, Wound Check    07/03/21 RLE deb w/ kerecis 12/10/21 in office micro 01/28/22 38 cm sq micro in office application      HPI: Patient is a 81 year old gentleman who is status post in office micro Kerecis graft on 12/10/2021 and 01/28/2022.  Patient currently wearing the knee-high Vive sock.  Assessment & Plan: Visit Diagnoses:  1. History of skin graft   2. Laceration of right lower leg, subsequent encounter     Plan: Patient is showing excellent improvement continue with the compression sock around-the-clock.  Follow-Up Instructions: Return in about 4 weeks (around 03/22/2022).   Ortho Exam  Patient is alert, oriented, no adenopathy, well-dressed, normal affect, normal respiratory effort. Examination patient has about a centimeter epithelization around the wound the wound is flat with healthy granulation tissue wound measures 5 x 4 cm.  Patient also has an ulcer over the tip of the right second toe this was debrided with a 10 blade knife back to healthy  viable bleeding granulation tissue.  Imaging: No results found.     Labs: Lab Results  Component Value Date   HGBA1C 8.2 (H) 08/25/2021   HGBA1C 7.6 (H) 05/19/2021   HGBA1C 9.5 (H) 12/23/2020   LABURIC 6.4 03/04/2011   REPTSTATUS 07/06/2021 FINAL 07/01/2021   GRAMSTAIN NO WBC SEEN NO ORGANISMS SEEN  07/01/2021   CULT  07/01/2021    FEW STAPHYLOCOCCUS AUREUS RARE PROTEUS MIRABILIS NO ANAEROBES ISOLATED Performed at [facility], [place] [address], [place], [place] [address]    [place] 07/01/2021   LABORGA PROTEUS MIRABILIS 07/01/2021     Lab Results  Component Value Date    ALBUMIN 2.4 (L) 08/27/2021   ALBUMIN 2.5 (L) 08/25/2021   ALBUMIN 2.6 (L) 06/05/2021    Lab Results  Component Value Date   MG 2.2 08/25/2021   MG 2.2 05/19/2021   MG 2.0 12/23/2020   No results found for: "VD25OH"  No results found for: "PREALBUMIN"    Latest Ref Rng & Units 08/28/2021    2:58 AM 08/27/2021    4:26 AM 08/26/2021    6:15 AM  CBC EXTENDED  WBC 4.0 - 10.5 K/uL 7.6  10.2  14.4   RBC 4.22 - 5.81 MIL/uL 3.97  3.98  4.12   Hemoglobin 13.0 - 17.0 g/dL 11.2  11.2  11.7   HCT 39.0 - 52.0 % 33.9  34.4  36.2   Platelets 150 - 400 K/uL 313  329  300      There is no height or weight on file to calculate BMI.  Orders:  No orders of the defined types were placed in this encounter.  No orders of the defined types were placed in this encounter.    Procedures: No procedures performed  Clinical Data: No additional findings.  ROS:  All other systems negative, except as noted in the HPI. Review of Systems  Objective: Vital Signs: There were no vitals taken for this visit.  Specialty Comments:  No specialty comments  available.  PMFS History: Patient Active Problem List   Diagnosis Date Noted   Aortic atherosclerosis ([place]) 09/04/2021   Hypercholesterolemia 09/04/2021   Unspecified atrial fibrillation ([place]) 08/25/2021   Acute on chronic diastolic CHF (congestive heart failure) ([place]) 08/25/2021   Wound of right leg, sequela 07/01/2021   Nail, injury by 06/10/2021   Laceration of leg 05/20/2021   Acute respiratory failure with hypoxia ([place]) 05/18/2021   Chronic combined systolic and diastolic heart failure ([place]) 05/18/2021   Chronic kidney disease (CKD), stage IV (severe) ([place]) 05/18/2021   Hypertension associated with diabetes ([place]) 05/18/2021   Insulin dependent type 2 diabetes mellitus ([place]) 05/18/2021   OSA on CPAP 05/18/2021   Laceration of right lower extremity 05/18/2021   Current chronic use of systemic steroids 01/08/2021   Preoperative  cardiovascular examination 01/08/2021   Cellulitis 12/23/2020   Benign neoplasm of duodenum, jejunum, 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   Chronic kidney disease, stage 4 (severe) ([place]) 12/03/2020   Degeneration of lumbar intervertebral disc 12/03/2020   Type 2 diabetes mellitus with stage 4 chronic kidney disease ([place]) 12/03/2020   Diabetic peripheral neuropathy associated with type 2 diabetes mellitus ([place]) 41/93/7902   Diastolic heart failure ([place]) 12/03/2020   Enlarged prostate 12/03/2020   Erectile dysfunction due to arterial insufficiency 12/03/2020   Essential hypertension 12/03/2020   Gastro-esophageal reflux disease without esophagitis 12/03/2020   Hematuria 12/03/2020   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   Morbid obesity ([place]) 12/03/2020   Osteoarthritis of hip 12/03/2020   Osteoarthritis of knee 12/03/2020   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/03/2020   Restless legs 12/03/2020   Skin sensation disturbance 12/03/2020   Lumbar stenosis 11/21/2020   Tendinitis 09/03/2020   Subacute arthropathy 09/03/2020   Bilateral hearing loss 08/11/2020   Impacted cerumen of left ear 03/10/2017   Excessive cerumen in left ear canal 03/10/2017   Faintness 12/09/2015   Backache 11/14/2014   CHRONIC PANCREATITIS 06/16/2009   Past Medical History:  Diagnosis Date   Arthritis    all joints   Chronic diastolic CHF (congestive heart failure) (HCC)    Chronic kidney disease (CKD), stage IV (severe) (HCC)    Chronic kidney disease, stage 3 (HCC)    Congestive heart failure with LV diastolic dysfunction, NYHA class 2 ([place])    Family history of adverse reaction to anesthesia    father- change in personaility   GERD (gastroesophageal reflux disease)    Hypertension    Hypertension associated with diabetes ([place])    Hypertensive chronic kidney disease    Insulin dependent type 2 diabetes mellitus ([place])    Neuropathy    OSA on CPAP    Sleep apnea     uses cpap, pt does not know settings    Family History  Problem Relation Age of Onset   Hypertension Mother    Anesthesia problems Neg Hx    Hypotension Neg Hx    Malignant hyperthermia Neg Hx    Pseudochol deficiency Neg Hx     Past Surgical History:  Procedure Laterality Date   ANTERIOR LAT LUMBAR FUSION Left 01/22/2021   Procedure: Lumbar one-Lumbar two Lateral Lumbar Interbody Fusion;  Surgeon: Vallarie Mare, MD;  Location: [facility];  Service: Neurosurgery;  Laterality: Left;   ANTERIOR LAT LUMBAR FUSION  01/22/2021   APPENDECTOMY     BACK SURGERY     4 surg, lower   CHOLECYSTECTOMY     COLONOSCOPY WITH PROPOFOL N/A 06/11/2014   Procedure:  COLONOSCOPY WITH PROPOFOL;  Surgeon: Garlan Fair, MD;  [facility];  Service: Endoscopy;  Laterality: N/A;   ESOPHAGOGASTRODUODENOSCOPY  08/26/2011   Procedure: ESOPHAGOGASTRODUODENOSCOPY (EGD);  Surgeon: Garlan Fair, MD;  [facility];  Service: Endoscopy;  Laterality: N/A;   ESOPHAGOGASTRODUODENOSCOPY (EGD) WITH PROPOFOL N/A 06/11/2014   Procedure: ESOPHAGOGASTRODUODENOSCOPY (EGD) WITH PROPOFOL;  Surgeon: Garlan Fair, MD;  [facility];  Service: Endoscopy;  Laterality: N/A;   EUS  09/15/2011   Procedure: UPPER ENDOSCOPIC ULTRASOUND (EUS) LINEAR;  Surgeon: Landry Dyke, MD;  [facility];  Service: Endoscopy;  Laterality: N/A;  MAC   I & D EXTREMITY Right 05/20/2021   Procedure: IRRIGATION AND DEBRIDEMENT OF LEG AND APPLICATION OF SKIN GRAFT;  Surgeon: Newt Minion, MD;  Location: [facility];  Service: Orthopedics;  Laterality: Right;   I & D EXTREMITY Right 07/03/2021   Procedure: REPEAT RIGHT LEG DEBRIDEMENT;  Surgeon: Newt Minion, MD;  Location: [facility];  Service: Orthopedics;  Laterality: Right;   I & D EXTREMITY Right 07/01/2021   Procedure: RIGHT LEG DEBRIDEMENT;  Surgeon: Newt Minion, MD;  Location: [facility];  Service: Orthopedics;  Laterality: Right;   KNEE ARTHROSCOPY Right  YEARS AGO   Social History   Occupational History   Occupation: retired  Tobacco Use   Smoking status: Former    Packs/day: 3.00    Years: 20.00    Total pack years: 60.00    Types: Cigarettes    Quit date: 1984    Years since quitting: 39.5   Smokeless tobacco: Never  Vaping Use   Vaping Use: Never used  Substance and Sexual Activity   Alcohol use: Not Currently   Drug use: Never   Sexual activity: Not on file

## 2022-03-08 DIAGNOSIS — M5136 Other intervertebral disc degeneration, lumbar region: Secondary | ICD-10-CM | POA: Diagnosis not present

## 2022-03-08 DIAGNOSIS — Z6827 Body mass index (BMI) 27.0-27.9, adult: Secondary | ICD-10-CM | POA: Diagnosis not present

## 2022-03-08 DIAGNOSIS — S3992XA Unspecified injury of lower back, initial encounter: Secondary | ICD-10-CM | POA: Diagnosis not present

## 2022-03-17 ENCOUNTER — Ambulatory Visit (INDEPENDENT_AMBULATORY_CARE_PROVIDER_SITE_OTHER): Payer: Medicare Other | Admitting: Podiatry

## 2022-03-17 ENCOUNTER — Encounter: Payer: Self-pay | Admitting: Podiatry

## 2022-03-17 DIAGNOSIS — M79674 Pain in right toe(s): Secondary | ICD-10-CM | POA: Diagnosis not present

## 2022-03-17 DIAGNOSIS — E1142 Type 2 diabetes mellitus with diabetic polyneuropathy: Secondary | ICD-10-CM

## 2022-03-17 DIAGNOSIS — N184 Chronic kidney disease, stage 4 (severe): Secondary | ICD-10-CM

## 2022-03-17 DIAGNOSIS — M79675 Pain in left toe(s): Secondary | ICD-10-CM

## 2022-03-17 DIAGNOSIS — B351 Tinea unguium: Secondary | ICD-10-CM

## 2022-03-17 NOTE — Progress Notes (Signed)
This patient returns to the office for evaluation and treatment of long thick painful nails .  This patient is unable to trim his own nails since the patient cannot reach his feet.  Patient says the nails are painful walking and wearing his shoes. Patient has history of back surgery. Patient was in [facility] in October 2022.    He returns for preventive foot care services.   General Appearance  Alert, conversant and in no acute stress.  Vascular  Dorsalis pedis and posterior tibial  pulses are palpable  bilaterally.  Capillary return is within normal limits  bilaterally. Temperature is within normal limits  bilaterally.  Neurologic  Senn-Weinstein monofilament wire test diminished  bilaterally. Muscle power within normal limits bilaterally.  Nails Thick disfigured discolored nails with subungual debris  from hallux to second  toes bilaterally. No evidence of bacterial infection or drainage bilaterally.Lateral distal aspect of the second toenail is absent.  Orthopedic  No limitations of motion  feet .  No crepitus or effusions noted.  No bony pathology or digital deformities noted. Prominent metatarsal heads  B/L.  Skin  Dry scaly  skin with no porokeratosis noted bilaterally.  No signs of infections or ulcers noted.     Onychomycosis  Pain in toes right foot  Pain in toes left foot    Debridement  of nails  1-5  B/L with a nail nipper.  Nails were then filed using a dremel tool with no incidents.   RTC 3 months    Lexi Conaty DPM   

## 2022-03-18 DIAGNOSIS — G4733 Obstructive sleep apnea (adult) (pediatric): Secondary | ICD-10-CM | POA: Diagnosis not present

## 2022-03-18 DIAGNOSIS — E1122 Type 2 diabetes mellitus with diabetic chronic kidney disease: Secondary | ICD-10-CM | POA: Diagnosis not present

## 2022-03-18 DIAGNOSIS — E1159 Type 2 diabetes mellitus with other circulatory complications: Secondary | ICD-10-CM | POA: Diagnosis not present

## 2022-03-18 DIAGNOSIS — I5030 Unspecified diastolic (congestive) heart failure: Secondary | ICD-10-CM | POA: Diagnosis not present

## 2022-03-18 DIAGNOSIS — N1832 Chronic kidney disease, stage 3b: Secondary | ICD-10-CM | POA: Diagnosis not present

## 2022-03-18 DIAGNOSIS — M353 Polymyalgia rheumatica: Secondary | ICD-10-CM | POA: Diagnosis not present

## 2022-03-18 DIAGNOSIS — I129 Hypertensive chronic kidney disease with stage 1 through stage 4 chronic kidney disease, or unspecified chronic kidney disease: Secondary | ICD-10-CM | POA: Diagnosis not present

## 2022-03-18 DIAGNOSIS — E1140 Type 2 diabetes mellitus with diabetic neuropathy, unspecified: Secondary | ICD-10-CM | POA: Diagnosis not present

## 2022-03-22 ENCOUNTER — Encounter: Payer: Self-pay | Admitting: Orthopedic Surgery

## 2022-03-22 ENCOUNTER — Ambulatory Visit (INDEPENDENT_AMBULATORY_CARE_PROVIDER_SITE_OTHER): Payer: Medicare Other | Admitting: Orthopedic Surgery

## 2022-03-22 DIAGNOSIS — S81811D Laceration without foreign body, right lower leg, subsequent encounter: Secondary | ICD-10-CM | POA: Diagnosis not present

## 2022-03-22 DIAGNOSIS — Z945 Skin transplant status: Secondary | ICD-10-CM
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
## 2022-03-26 ENCOUNTER — Other Ambulatory Visit: Payer: Self-pay | Admitting: Family

## 2022-03-31 DIAGNOSIS — N184 Chronic kidney disease, stage 4 (severe): Secondary | ICD-10-CM | POA: Diagnosis not present

## 2022-04-01 ENCOUNTER — Ambulatory Visit (INDEPENDENT_AMBULATORY_CARE_PROVIDER_SITE_OTHER): Payer: Medicare Other | Admitting: Orthopedic Surgery

## 2022-04-01 ENCOUNTER — Encounter: Payer: Self-pay | Admitting: Orthopedic Surgery

## 2022-04-01 DIAGNOSIS — S81801S Unspecified open wound, right lower leg, sequela: Secondary | ICD-10-CM

## 2022-04-01 NOTE — Progress Notes (Signed)
Office Visit Note   Patient: Robert Dickerson           Date of Birth: 1940/09/27           MRN: [id number] Visit Date: 04/01/2022              Requested by: Kathalene Frames, MD [address] [place] [address] [place],  [place] [address] PCP: Kathalene Frames, MD  Chief Complaint  Patient presents with   Right Leg - Follow-up    07/03/21 right LE deb and graft  In office application Kerecis today.       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  The wound has failed to heal over the past 4 weeks and patient presents at this time for [facility] tissue graft.  Patient also complains of itching.  Assessment & Plan: Visit Diagnoses:  1. Leg wound, right, sequela     Plan: Tissue graft applied he will keep the Adaptic in place change outside dressing daily follow-up in 1 week.  Discussed that he could stop the Trental and Lyrica to see if this would help with his itching.  Follow-Up Instructions: Return in about 1 week (around 04/08/2022).   Ortho Exam  Patient is alert, oriented, no adenopathy, well-dressed, normal affect, normal respiratory effort. Examination of the right lower extremity there is no redness no cellulitis no odor or drainage no signs of infection.  After informed consent a 10 blade knife was used to debride the skin and soft tissue back to healthy viable bleeding granulation tissue.  The wound bed was 5 x 5.5 cm after debridement.  The wound was 3 mm deep.  The Kerecis micro powder 38 cm was applied in the wound and the additional product was used to fill in the depth of the wound.  This was secured with Adaptic 4 x 4's and an Ace wrap.  Patient will change the Ace compressive wrap until the graft incorporates and then resume his compression socks.  Imaging: No results found.     Labs: Lab Results  Component Value Date   HGBA1C 8.2 (H) 08/25/2021   HGBA1C 7.6 (H)  05/19/2021   HGBA1C 9.5 (H) 12/23/2020   LABURIC 6.4 03/04/2011   REPTSTATUS 07/06/2021 FINAL 07/01/2021   GRAMSTAIN NO WBC SEEN NO ORGANISMS SEEN  07/01/2021   CULT  07/01/2021    FEW STAPHYLOCOCCUS AUREUS RARE PROTEUS MIRABILIS NO ANAEROBES ISOLATED Performed at [facility], [place] [address] [place], [place] [address]    LABORGA STAPHYLOCOCCUS AUREUS 07/01/2021   LABORGA PROTEUS MIRABILIS 07/01/2021     Lab Results  Component Value Date   ALBUMIN 2.4 (L) 08/27/2021   ALBUMIN 2.5 (L) 08/25/2021   ALBUMIN 2.6 (L) 06/05/2021    Lab Results  Component Value Date   MG 2.2 08/25/2021   MG 2.2 05/19/2021   MG 2.0 12/23/2020   No results found for: "VD25OH"  No results found for: "PREALBUMIN"    Latest Ref Rng & Units 08/28/2021    2:58 AM 08/27/2021    4:26 AM 08/26/2021    6:15 AM  CBC EXTENDED  WBC 4.0 - 10.5 K/uL 7.6  10.2  14.4   RBC 4.22 - 5.81 MIL/uL 3.97  3.98  4.12   Hemoglobin 13.0 - 17.0 g/dL 11.2  11.2  11.7   HCT 39.0 - 52.0 % 33.9  34.4  36.2   Platelets 150 -  400 K/uL 313  329  300      There is no height or weight on file to calculate BMI.  Orders:  No orders of the defined types were placed in this encounter.  No orders of the defined types were placed in this encounter.    Procedures: No procedures performed  Clinical Data: No additional findings.  ROS:  All other systems negative, except as noted in the HPI. Review of Systems  Objective: Vital Signs: There were no vitals taken for this visit.  Specialty Comments:  No specialty comments available.  PMFS History: Patient Active Problem List   Diagnosis Date Noted   Aortic atherosclerosis ([place]) 09/04/2021   Hypercholesterolemia 09/04/2021   Unspecified atrial fibrillation ([place]) 08/25/2021   Acute on chronic diastolic CHF (congestive heart failure) ([place]) 08/25/2021   Wound of right leg, sequela 07/01/2021   Nail, injury by 06/10/2021   Laceration of leg 05/20/2021   Acute  respiratory failure with hypoxia ([place]) 05/18/2021   Chronic combined systolic and diastolic heart failure ([place]) 05/18/2021   Chronic kidney disease (CKD), stage IV (severe) ([place]) 05/18/2021   Hypertension associated with diabetes ([place]) 05/18/2021   Insulin dependent type 2 diabetes mellitus ([place]) 05/18/2021   OSA on CPAP 05/18/2021   Laceration of right lower extremity 05/18/2021   Current chronic use of systemic steroids 01/08/2021   Preoperative cardiovascular examination 01/08/2021   Cellulitis 12/23/2020   Benign neoplasm of duodenum, jejunum, 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   Chronic kidney disease, stage 4 (severe) ([place]) 12/03/2020   Degeneration of lumbar intervertebral disc 12/03/2020   Type 2 diabetes mellitus with stage 4 chronic kidney disease ([place]) 12/03/2020   Diabetic peripheral neuropathy associated with type 2 diabetes mellitus ([place]) 94/49/6759   Diastolic heart failure ([place]) 12/03/2020   Enlarged prostate 12/03/2020   Erectile dysfunction due to arterial insufficiency 12/03/2020   Essential hypertension 12/03/2020   Gastro-esophageal reflux disease without esophagitis 12/03/2020   Hematuria 12/03/2020   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   Morbid obesity ([place]) 12/03/2020   Osteoarthritis of hip 12/03/2020   Osteoarthritis of knee 12/03/2020   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/03/2020   Restless legs 12/03/2020   Skin sensation disturbance 12/03/2020   Lumbar stenosis 11/21/2020   Tendinitis 09/03/2020   Subacute arthropathy 09/03/2020   Bilateral hearing loss 08/11/2020   Impacted cerumen of left ear 03/10/2017   Excessive cerumen in left ear canal 03/10/2017   Faintness 12/09/2015   Backache 11/14/2014   CHRONIC PANCREATITIS 06/16/2009   Past Medical History:  Diagnosis Date   Arthritis    all joints   Chronic diastolic CHF (congestive heart failure) (HCC)    Chronic kidney disease (CKD), stage IV (severe)  (HCC)    Chronic kidney disease, stage 3 (HCC)    Congestive heart failure with LV diastolic dysfunction, NYHA class 2 ([place])    Family history of adverse reaction to anesthesia    father- change in personaility   GERD (gastroesophageal reflux disease)    Hypertension    Hypertension associated with diabetes ([place])    Hypertensive chronic kidney disease    Insulin dependent type 2 diabetes mellitus ([place])    Neuropathy    OSA on CPAP    Sleep apnea    uses cpap, pt does not know settings    Family History  Problem Relation Age of Onset   Hypertension Mother    Anesthesia problems Neg Hx    Hypotension Neg Hx  Malignant hyperthermia Neg Hx    Pseudochol deficiency Neg Hx     Past Surgical History:  Procedure Laterality Date   ANTERIOR LAT LUMBAR FUSION Left 01/22/2021   Procedure: Lumbar one-Lumbar two Lateral Lumbar Interbody Fusion;  Surgeon: Vallarie Mare, MD;  Location: [facility];  Service: Neurosurgery;  Laterality: Left;   ANTERIOR LAT LUMBAR FUSION  01/22/2021   APPENDECTOMY     BACK SURGERY     4 surg, lower   CHOLECYSTECTOMY     COLONOSCOPY WITH PROPOFOL N/A 06/11/2014   Procedure: COLONOSCOPY WITH PROPOFOL;  Surgeon: Garlan Fair, MD;  [facility];  Service: Endoscopy;  Laterality: N/A;   ESOPHAGOGASTRODUODENOSCOPY  08/26/2011   Procedure: ESOPHAGOGASTRODUODENOSCOPY (EGD);  Surgeon: Garlan Fair, MD;  [facility];  Service: Endoscopy;  Laterality: N/A;   ESOPHAGOGASTRODUODENOSCOPY (EGD) WITH PROPOFOL N/A 06/11/2014   Procedure: ESOPHAGOGASTRODUODENOSCOPY (EGD) WITH PROPOFOL;  Surgeon: Garlan Fair, MD;  [facility];  Service: Endoscopy;  Laterality: N/A;   EUS  09/15/2011   Procedure: UPPER ENDOSCOPIC ULTRASOUND (EUS) LINEAR;  Surgeon: Landry Dyke, MD;  [facility];  Service: Endoscopy;  Laterality: N/A;  MAC   I & D EXTREMITY Right 05/20/2021   Procedure: IRRIGATION AND DEBRIDEMENT OF LEG AND APPLICATION OF  SKIN GRAFT;  Surgeon: Newt Minion, MD;  Location: [facility];  Service: Orthopedics;  Laterality: Right;   I & D EXTREMITY Right 07/03/2021   Procedure: REPEAT RIGHT LEG DEBRIDEMENT;  Surgeon: Newt Minion, MD;  Location: [facility];  Service: Orthopedics;  Laterality: Right;   I & D EXTREMITY Right 07/01/2021   Procedure: RIGHT LEG DEBRIDEMENT;  Surgeon: Newt Minion, MD;  Location: [facility];  Service: Orthopedics;  Laterality: Right;   KNEE ARTHROSCOPY Right YEARS AGO   Social History   Occupational History   Occupation: retired  Tobacco Use   Smoking status: Former    Packs/day: 3.00    Years: 20.00    Total pack years: 60.00    Types: Cigarettes    Quit date: 1984    Years since quitting: 39.6   Smokeless tobacco: Never  Vaping Use   Vaping Use: Never used  Substance and Sexual Activity   Alcohol use: Not Currently   Drug use: Never   Sexual activity: Not on file

## 2022-04-06 DIAGNOSIS — N184 Chronic kidney disease, stage 4 (severe): Secondary | ICD-10-CM | POA: Diagnosis not present

## 2022-04-06 DIAGNOSIS — I5020 Unspecified systolic (congestive) heart failure: Secondary | ICD-10-CM | POA: Diagnosis not present

## 2022-04-06 DIAGNOSIS — L299 Pruritus, unspecified: Secondary | ICD-10-CM | POA: Diagnosis not present

## 2022-04-06 DIAGNOSIS — L97919 Non-pressure chronic ulcer of unspecified part of right lower leg with unspecified severity: Secondary | ICD-10-CM | POA: Diagnosis not present

## 2022-04-08 ENCOUNTER — Ambulatory Visit (INDEPENDENT_AMBULATORY_CARE_PROVIDER_SITE_OTHER): Payer: Medicare Other | Admitting: Orthopedic Surgery

## 2022-04-08 DIAGNOSIS — S81801S Unspecified open wound, right lower leg, sequela: Secondary | ICD-10-CM | POA: Diagnosis not present

## 2022-04-09 ENCOUNTER — Encounter: Payer: Self-pay | Admitting: Orthopedic Surgery

## 2022-04-09 NOTE — Progress Notes (Signed)
Office Visit Note   Patient: Robert Dickerson           Date of Birth: [birth year]           MRN: [id number] Visit Date: 04/08/2022              Requested by: Kathalene Frames, MD [address], [place],  [place] [address] PCP: Kathalene Frames, MD  Chief Complaint  Patient presents with   Right Leg - Wound Check      HPI: Patient is a 81 year old gentleman with chronic ulceration right lower extremity.  Patient has stage IV kidney disease and is complaining of itching.  Assessment & Plan: Visit Diagnoses:  1. Leg wound, right, sequela     Plan: The wound was debrided Kerecis applied with a compression wrap.  Patient will start changing the outside layer of the compression wrap daily.  Recommend that he decrease his Lyrica and recommended increasing water intake to see if this would help with the itching.  Recommended nutritional supplement with protein supplement 2 times a day including omega-3.  Recommended elevation and exercise of the ankle and toes.  Follow-Up Instructions: Return in about 1 week (around 04/15/2022).   Ortho Exam  Patient is alert, oriented, no adenopathy, well-dressed, normal affect, normal respiratory effort. Examination patient has a healthy wound bed.  There is no cellulitis no odor no drainage no signs of infection.  After informed consent a 10 blade knife was used to debride the skin and soft tissue back to healthy viable tissue.  The wound measures 5 x 6 cm and 2 to 3 mm deep.  And the 38 cm Kerecis powder was used to fill the wound the extra 8 units was used to fill the depth of the wound.  The graft was secured with a bolster.  Adaptic nonadherent gauze was applied directly over the graft.  There was no product wasted.  Please see attached documentation for the Lot number and expiration date of the [facility] product.  Imaging: No results found.     Labs: Lab Results  Component Value Date   HGBA1C 8.2 (H) 08/25/2021    HGBA1C 7.6 (H) 05/19/2021   HGBA1C 9.5 (H) 12/23/2020   LABURIC 6.4 03/04/2011   REPTSTATUS 07/06/2021 FINAL 07/01/2021   GRAMSTAIN NO WBC SEEN NO ORGANISMS SEEN  07/01/2021   CULT  07/01/2021    FEW STAPHYLOCOCCUS AUREUS RARE PROTEUS MIRABILIS NO ANAEROBES ISOLATED Performed at [facility], [place] [address] [place], [place] [address]    LABORGA STAPHYLOCOCCUS AUREUS 07/01/2021   LABORGA PROTEUS MIRABILIS 07/01/2021     Lab Results  Component Value Date   ALBUMIN 2.4 (L) 08/27/2021   ALBUMIN 2.5 (L) 08/25/2021   ALBUMIN 2.6 (L) 06/05/2021    Lab Results  Component Value Date   MG 2.2 08/25/2021   MG 2.2 05/19/2021   MG 2.0 12/23/2020   No results found for: "VD25OH"  No results found for: "PREALBUMIN"    Latest Ref Rng & Units 08/28/2021    2:58 AM 08/27/2021    4:26 AM 08/26/2021    6:15 AM  CBC EXTENDED  WBC 4.0 - 10.5 K/uL 7.6  10.2  14.4   RBC 4.22 - 5.81 MIL/uL 3.97  3.98  4.12   Hemoglobin 13.0 - 17.0 g/dL 11.2  11.2  11.7   HCT 39.0 - 52.0 % 33.9  34.4  36.2   Platelets 150 - 400 K/uL 313  329  300      There is no height or weight on file to calculate BMI.  Orders:  No orders of the defined types were placed in this encounter.  No orders of the defined types were placed in this encounter.    Procedures: No procedures performed  Clinical Data: No additional findings.  ROS:  All other systems negative, except as noted in the HPI. Review of Systems  Objective: Vital Signs: There were no vitals taken for this visit.  Specialty Comments:  No specialty comments available.  PMFS History: Patient Active Problem List   Diagnosis Date Noted   Aortic atherosclerosis ([place]) 09/04/2021   Hypercholesterolemia 09/04/2021   Unspecified atrial fibrillation ([place]) 08/25/2021   Acute on chronic diastolic CHF (congestive heart failure) ([place]) 08/25/2021   Wound of right leg, sequela 07/01/2021   Nail, injury by 06/10/2021   Laceration of leg  05/20/2021   Acute respiratory failure with hypoxia ([place]) 05/18/2021   Chronic combined systolic and diastolic heart failure ([place]) 05/18/2021   Chronic kidney disease (CKD), stage IV (severe) ([place]) 05/18/2021   Hypertension associated with diabetes ([place]) 05/18/2021   Insulin dependent type 2 diabetes mellitus ([place]) 05/18/2021   OSA on CPAP 05/18/2021   Laceration of right lower extremity 05/18/2021   Current chronic use of systemic steroids 01/08/2021   Preoperative cardiovascular examination 01/08/2021   Cellulitis 12/23/2020   Benign neoplasm of duodenum, jejunum, 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   Chronic kidney disease, stage 4 (severe) ([place]) 12/03/2020   Degeneration of lumbar intervertebral disc 12/03/2020   Type 2 diabetes mellitus with stage 4 chronic kidney disease ([place]) 12/03/2020   Diabetic peripheral neuropathy associated with type 2 diabetes mellitus ([place]) 01/23/7627   Diastolic heart failure ([place]) 12/03/2020   Enlarged prostate 12/03/2020   Erectile dysfunction due to arterial insufficiency 12/03/2020   Essential hypertension 12/03/2020   Gastro-esophageal reflux disease without esophagitis 12/03/2020   Hematuria 12/03/2020   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   Morbid obesity ([place]) 12/03/2020   Osteoarthritis of hip 12/03/2020   Osteoarthritis of knee 12/03/2020   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   Restless legs 12/03/2020   Skin sensation disturbance 12/03/2020   Lumbar stenosis 11/21/2020   Tendinitis 09/03/2020   Subacute arthropathy 09/03/2020   Bilateral hearing loss 08/11/2020   Impacted cerumen of left ear 03/10/2017   Excessive cerumen in left ear canal 03/10/2017   Faintness 12/09/2015   Backache 11/14/2014   CHRONIC PANCREATITIS 06/16/2009   Past Medical History:  Diagnosis Date   Arthritis    all joints   Chronic diastolic CHF (congestive heart failure) (HCC)    Chronic kidney disease (CKD),  stage IV (severe) (HCC)    Chronic kidney disease, stage 3 (HCC)    Congestive heart failure with LV diastolic dysfunction, NYHA class 2 ([place])    Family history of adverse reaction to anesthesia    father- change in personaility   GERD (gastroesophageal reflux disease)    Hypertension    Hypertension associated with diabetes ([place])    Hypertensive chronic kidney disease    Insulin dependent type 2 diabetes mellitus ([place])    Neuropathy    OSA on CPAP    Sleep apnea    uses cpap, pt does not know settings    Family History  Problem Relation Age of Onset   Hypertension Mother    Anesthesia problems Neg Hx    Hypotension Neg Hx    Malignant hyperthermia Neg Hx  Pseudochol deficiency Neg Hx     Past Surgical History:  Procedure Laterality Date   ANTERIOR LAT LUMBAR FUSION Left 01/22/2021   Procedure: Lumbar one-Lumbar two Lateral Lumbar Interbody Fusion;  Surgeon: Vallarie Mare, MD;  Location: [facility];  Service: Neurosurgery;  Laterality: Left;   ANTERIOR LAT LUMBAR FUSION  01/22/2021   APPENDECTOMY     BACK SURGERY     4 surg, lower   CHOLECYSTECTOMY     COLONOSCOPY WITH PROPOFOL N/A 06/11/2014   Procedure: COLONOSCOPY WITH PROPOFOL;  Surgeon: Garlan Fair, MD;  [facility];  Service: Endoscopy;  Laterality: N/A;   ESOPHAGOGASTRODUODENOSCOPY  08/26/2011   Procedure: ESOPHAGOGASTRODUODENOSCOPY (EGD);  Surgeon: Garlan Fair, MD;  [facility];  Service: Endoscopy;  Laterality: N/A;   ESOPHAGOGASTRODUODENOSCOPY (EGD) WITH PROPOFOL N/A 06/11/2014   Procedure: ESOPHAGOGASTRODUODENOSCOPY (EGD) WITH PROPOFOL;  Surgeon: Garlan Fair, MD;  [facility];  Service: Endoscopy;  Laterality: N/A;   EUS  09/15/2011   Procedure: UPPER ENDOSCOPIC ULTRASOUND (EUS) LINEAR;  Surgeon: Landry Dyke, MD;  [facility];  Service: Endoscopy;  Laterality: N/A;  MAC   I & D EXTREMITY Right 05/20/2021   Procedure: IRRIGATION AND DEBRIDEMENT OF LEG  AND APPLICATION OF SKIN GRAFT;  Surgeon: Newt Minion, MD;  Location: [facility];  Service: Orthopedics;  Laterality: Right;   I & D EXTREMITY Right 07/03/2021   Procedure: REPEAT RIGHT LEG DEBRIDEMENT;  Surgeon: Newt Minion, MD;  Location: [facility];  Service: Orthopedics;  Laterality: Right;   I & D EXTREMITY Right 07/01/2021   Procedure: RIGHT LEG DEBRIDEMENT;  Surgeon: Newt Minion, MD;  Location: [facility];  Service: Orthopedics;  Laterality: Right;   KNEE ARTHROSCOPY Right YEARS AGO   Social History   Occupational History   Occupation: retired  Tobacco Use   Smoking status: Former    Packs/day: 3.00    Years: 20.00    Total pack years: 60.00    Types: Cigarettes    Quit date: 1984    Years since quitting: 39.7   Smokeless tobacco: Never  Vaping Use   Vaping Use: Never used  Substance and Sexual Activity   Alcohol use: Not Currently   Drug use: Never   Sexual activity: Not on file

## 2022-04-12 DIAGNOSIS — Z23 Encounter for immunization: Secondary | ICD-10-CM | POA: Diagnosis not present

## 2022-04-12 DIAGNOSIS — E1122 Type 2 diabetes mellitus with diabetic chronic kidney disease: Secondary | ICD-10-CM | POA: Diagnosis not present

## 2022-04-12 DIAGNOSIS — L299 Pruritus, unspecified: Secondary | ICD-10-CM | POA: Diagnosis not present

## 2022-04-15 ENCOUNTER — Encounter: Payer: Self-pay | Admitting: Orthopedic Surgery

## 2022-04-15 ENCOUNTER — Ambulatory Visit (INDEPENDENT_AMBULATORY_CARE_PROVIDER_SITE_OTHER): Payer: Medicare Other | Admitting: Orthopedic Surgery

## 2022-04-15 DIAGNOSIS — Z945 Skin transplant status: Secondary | ICD-10-CM

## 2022-04-15 DIAGNOSIS — S81801S Unspecified open wound, right lower leg, sequela: Secondary | ICD-10-CM

## 2022-04-15 NOTE — Progress Notes (Signed)
Office Visit Note   Patient: Robert Dickerson           Date of Birth: [birth year]           MRN: [id number] Visit Date: 04/15/2022              Requested by: Kathalene Frames, MD [address] [place],  [place] [address] PCP: Kathalene Frames, MD  Chief Complaint  Patient presents with   Right Leg - Follow-up    Hx for RLE debridement and graft 70/04/6282 In office application of Kerecis 6/62/9476 and 04/08/2022      HPI: Patient is a 81 year old gentleman who presents in follow-up 9 months status post initial debridement and grafting to the right lower extremity.  Patient has had an office application of Kerecis tissue graft on August 31 and September 7.  Assessment & Plan: Visit Diagnoses: No diagnosis found.  Plan: Recommend continue with protein and omega-3 supplements.  Continue with current compression and daily dressing changes continue with elevation.  Follow-Up Instructions: Return in about 2 weeks (around 04/29/2022).   Ortho Exam  Patient is alert, oriented, no adenopathy, well-dressed, normal affect, normal respiratory effort. Examination patient does have venous stasis swelling.  The ulcer measures 5 x 4 cm.  After gentle debridement with gauze there is healthy granulation tissue in the wound bed.  Patient also complains of pain over the medial malleolus of the left ankle.  Examination patient does have varicose veins venous insufficiency brawny edema without ulceration or cellulitis.  There is no drainage.  Imaging: No results found.     Labs: Lab Results  Component Value Date   HGBA1C 8.2 (H) 08/25/2021   HGBA1C 7.6 (H) 05/19/2021   HGBA1C 9.5 (H) 12/23/2020   LABURIC 6.4 03/04/2011   REPTSTATUS 07/06/2021 FINAL 07/01/2021   GRAMSTAIN NO WBC SEEN NO ORGANISMS SEEN  07/01/2021   CULT  07/01/2021    FEW STAPHYLOCOCCUS AUREUS RARE PROTEUS MIRABILIS NO ANAEROBES ISOLATED Performed at [facility], [place] [address]  [place], [place] [address]    [place] 07/01/2021   LABORGA PROTEUS MIRABILIS 07/01/2021     Lab Results  Component Value Date   ALBUMIN 2.4 (L) 08/27/2021   ALBUMIN 2.5 (L) 08/25/2021   ALBUMIN 2.6 (L) 06/05/2021    Lab Results  Component Value Date   MG 2.2 08/25/2021   MG 2.2 05/19/2021   MG 2.0 12/23/2020   No results found for: "VD25OH"  No results found for: "PREALBUMIN"    Latest Ref Rng & Units 08/28/2021    2:58 AM 08/27/2021    4:26 AM 08/26/2021    6:15 AM  CBC EXTENDED  WBC 4.0 - 10.5 K/uL 7.6  10.2  14.4   RBC 4.22 - 5.81 MIL/uL 3.97  3.98  4.12   Hemoglobin 13.0 - 17.0 g/dL 11.2  11.2  11.7   HCT 39.0 - 52.0 % 33.9  34.4  36.2   Platelets 150 - 400 K/uL 313  329  300      There is no height or weight on file to calculate BMI.  Orders:  No orders of the defined types were placed in this encounter.  No orders of the defined types were placed in this encounter.    Procedures: No procedures performed  Clinical Data: No additional findings.  ROS:  All other systems negative, except as noted in the HPI. Review of Systems  Objective: Vital Signs: There were  no vitals taken for this visit.  Specialty Comments:  No specialty comments available.  PMFS History: Patient Active Problem List   Diagnosis Date Noted   Aortic atherosclerosis ([place]) 09/04/2021   Hypercholesterolemia 09/04/2021   Unspecified atrial fibrillation ([place]) 08/25/2021   Acute on chronic diastolic CHF (congestive heart failure) ([place]) 08/25/2021   Wound of right leg, sequela 07/01/2021   Nail, injury by 06/10/2021   Laceration of leg 05/20/2021   Acute respiratory failure with hypoxia ([place]) 05/18/2021   Chronic combined systolic and diastolic heart failure ([place]) 05/18/2021   Chronic kidney disease (CKD), stage IV (severe) ([place]) 05/18/2021   Hypertension associated with diabetes ([place]) 05/18/2021   Insulin dependent type 2 diabetes mellitus ([place]) 05/18/2021    OSA on CPAP 05/18/2021   Laceration of right lower extremity 05/18/2021   Current chronic use of systemic steroids 01/08/2021   Preoperative cardiovascular examination 01/08/2021   Cellulitis 12/23/2020   Benign neoplasm of duodenum, jejunum, 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   Chronic kidney disease, stage 4 (severe) ([place]) 12/03/2020   Degeneration of lumbar intervertebral disc 12/03/2020   Type 2 diabetes mellitus with stage 4 chronic kidney disease ([place]) 12/03/2020   Diabetic peripheral neuropathy associated with type 2 diabetes mellitus ([place]) 48/25/0037   Diastolic heart failure ([place]) 12/03/2020   Enlarged prostate 12/03/2020   Erectile dysfunction due to arterial insufficiency 12/03/2020   Essential hypertension 12/03/2020   Gastro-esophageal reflux disease without esophagitis 12/03/2020   Hematuria 12/03/2020   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   Morbid obesity ([place]) 12/03/2020   Osteoarthritis of hip 12/03/2020   Osteoarthritis of knee 12/03/2020   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   Restless legs 12/03/2020   Skin sensation disturbance 12/03/2020   Lumbar stenosis 11/21/2020   Tendinitis 09/03/2020   Subacute arthropathy 09/03/2020   Bilateral hearing loss 08/11/2020   Impacted cerumen of left ear 03/10/2017   Excessive cerumen in left ear canal 03/10/2017   Faintness 12/09/2015   Backache 11/14/2014   CHRONIC PANCREATITIS 06/16/2009   Past Medical History:  Diagnosis Date   Arthritis    all joints   Chronic diastolic CHF (congestive heart failure) (HCC)    Chronic kidney disease (CKD), stage IV (severe) (HCC)    Chronic kidney disease, stage 3 (HCC)    Congestive heart failure with LV diastolic dysfunction, NYHA class 2 ([place])    Family history of adverse reaction to anesthesia    father- change in personaility   GERD (gastroesophageal reflux disease)    Hypertension    Hypertension associated with diabetes  ([place])    Hypertensive chronic kidney disease    Insulin dependent type 2 diabetes mellitus ([place])    Neuropathy    OSA on CPAP    Sleep apnea    uses cpap, pt does not know settings    Family History  Problem Relation Age of Onset   Hypertension Mother    Anesthesia problems Neg Hx    Hypotension Neg Hx    Malignant hyperthermia Neg Hx    Pseudochol deficiency Neg Hx     Past Surgical History:  Procedure Laterality Date   ANTERIOR LAT LUMBAR FUSION Left 01/22/2021   Procedure: Lumbar one-Lumbar two Lateral Lumbar Interbody Fusion;  Surgeon: Vallarie Mare, MD;  Location: [facility];  Service: Neurosurgery;  Laterality: Left;   ANTERIOR LAT LUMBAR FUSION  01/22/2021   APPENDECTOMY     BACK SURGERY     4 surg, lower  CHOLECYSTECTOMY     COLONOSCOPY WITH PROPOFOL N/A 06/11/2014   Procedure: COLONOSCOPY WITH PROPOFOL;  Surgeon: Garlan Fair, MD;  [facility];  Service: Endoscopy;  Laterality: N/A;   ESOPHAGOGASTRODUODENOSCOPY  08/26/2011   Procedure: ESOPHAGOGASTRODUODENOSCOPY (EGD);  Surgeon: Garlan Fair, MD;  [facility];  Service: Endoscopy;  Laterality: N/A;   ESOPHAGOGASTRODUODENOSCOPY (EGD) WITH PROPOFOL N/A 06/11/2014   Procedure: ESOPHAGOGASTRODUODENOSCOPY (EGD) WITH PROPOFOL;  Surgeon: Garlan Fair, MD;  [facility];  Service: Endoscopy;  Laterality: N/A;   EUS  09/15/2011   Procedure: UPPER ENDOSCOPIC ULTRASOUND (EUS) LINEAR;  Surgeon: Landry Dyke, MD;  [facility];  Service: Endoscopy;  Laterality: N/A;  MAC   I & D EXTREMITY Right 05/20/2021   Procedure: IRRIGATION AND DEBRIDEMENT OF LEG AND APPLICATION OF SKIN GRAFT;  Surgeon: Newt Minion, MD;  Location: [facility];  Service: Orthopedics;  Laterality: Right;   I & D EXTREMITY Right 07/03/2021   Procedure: REPEAT RIGHT LEG DEBRIDEMENT;  Surgeon: Newt Minion, MD;  Location: [facility];  Service: Orthopedics;  Laterality: Right;   I & D EXTREMITY Right 07/01/2021    Procedure: RIGHT LEG DEBRIDEMENT;  Surgeon: Newt Minion, MD;  Location: [facility];  Service: Orthopedics;  Laterality: Right;   KNEE ARTHROSCOPY Right YEARS AGO   Social History   Occupational History   Occupation: retired  Tobacco Use   Smoking status: Former    Packs/day: 3.00    Years: 20.00    Total pack years: 60.00    Types: Cigarettes    Quit date: 1984    Years since quitting: 39.7   Smokeless tobacco: Never  Vaping Use   Vaping Use: Never used  Substance and Sexual Activity   Alcohol use: Not Currently   Drug use: Never   Sexual activity: Not on file

## 2022-04-16 DIAGNOSIS — I10 Essential (primary) hypertension: Secondary | ICD-10-CM | POA: Diagnosis not present

## 2022-04-16 DIAGNOSIS — I5040 Unspecified combined systolic (congestive) and diastolic (congestive) heart failure: Secondary | ICD-10-CM | POA: Diagnosis not present

## 2022-04-16 DIAGNOSIS — E1122 Type 2 diabetes mellitus with diabetic chronic kidney disease: Secondary | ICD-10-CM | POA: Diagnosis not present

## 2022-04-16 DIAGNOSIS — I5030 Unspecified diastolic (congestive) heart failure: Secondary | ICD-10-CM | POA: Diagnosis not present

## 2022-04-16 DIAGNOSIS — N183 Chronic kidney disease, stage 3 unspecified: Secondary | ICD-10-CM | POA: Diagnosis not present

## 2022-04-16 DIAGNOSIS — E0859 Diabetes mellitus due to underlying condition with other circulatory complications: Secondary | ICD-10-CM | POA: Diagnosis not present

## 2022-04-22 ENCOUNTER — Encounter: Payer: Self-pay | Admitting: Orthopedic Surgery

## 2022-04-22 ENCOUNTER — Ambulatory Visit (INDEPENDENT_AMBULATORY_CARE_PROVIDER_SITE_OTHER): Payer: Medicare Other | Admitting: Orthopedic Surgery

## 2022-04-22 DIAGNOSIS — S81801S Unspecified open wound, right lower leg, sequela: Secondary | ICD-10-CM

## 2022-04-22 DIAGNOSIS — Z23 Encounter for immunization: Secondary | ICD-10-CM | POA: Diagnosis not present

## 2022-04-22 MED ORDER — CLOBETASOL PROPIONATE 0.05 % EX CREA
1.0000 | TOPICAL_CREAM | Freq: Two times a day (BID) | CUTANEOUS | 0 refills | Status: DC
Start: 2022-04-22 — End: 2022-04-27

## 2022-04-22 NOTE — Progress Notes (Signed)
Office Visit Note   Patient: Robert Dickerson           Date of Birth: 06-17-1941           MRN: [id number] Visit Date: 04/22/2022              Requested by: Kathalene Frames, MD [address], [place],  [place] [address] PCP: Kathalene Frames, MD  Chief Complaint  Patient presents with   Right Leg - Follow-up    07/03/2021 RLE deb and graft 04/01/22, 08/10/48 in office application       HPI: Patient is a 81 year old gentleman with a chronic wound right tibia.  Patient has had improvement in wound healing but the wound has stalled with no further advancement in healing at this time.  Patient presents for evaluation for additional biologic tissue graft application.  Assessment & Plan: Visit Diagnoses:  1. Leg wound, right, sequela     Plan: Kerecis tissue graft was applied 19 cm.  Recommend start daily dressing change with leave the Adaptic in place change the 4 x 4 gauze and the Ace wrap daily at 1 week begin washing with soap and water and wear the compression sock.  Follow-Up Instructions: Return in about 2 weeks (around 05/06/2022).   Ortho Exam  Patient is alert, oriented, no adenopathy, well-dressed, normal affect, normal respiratory effort. Examination of the ulcer measures 4 x 5.5 cm and 1 mm deep.  The wound healing has stalled, the wound bed has healthy granulation tissue, and patient presents for evaluation and application of [place] Micro graft. After informed consent a 10 blade knife was used to debride the skin and soft tissue to healthy viable bleeding granulation tissue.  Silver nitrate was used for hemostasis. The wound measures: 5.5 cm in length, 4cm  in width, 1 mm in depth, wound location anterior Kerecis MariGen micro tissue graft 19 cm2 was applied, and there was no wastage.  Please see the photo below of the Lot number and expiration date. The micro tissue graft was covered with a nonadherent Adaptic dressing, bolstered with  4 x 4 gauze and secured with a compression wrap.     Imaging: No results found.      Labs: Lab Results  Component Value Date   HGBA1C 8.2 (H) 08/25/2021   HGBA1C 7.6 (H) 05/19/2021   HGBA1C 9.5 (H) 12/23/2020   LABURIC 6.4 03/04/2011   REPTSTATUS 07/06/2021 FINAL 07/01/2021   GRAMSTAIN NO WBC SEEN NO ORGANISMS SEEN  07/01/2021   CULT  07/01/2021    FEW STAPHYLOCOCCUS AUREUS RARE PROTEUS MIRABILIS NO ANAEROBES ISOLATED Performed at [facility], [place] [address], [place],  [address]    LABORGA STAPHYLOCOCCUS AUREUS 07/01/2021   LABORGA PROTEUS MIRABILIS 07/01/2021     Lab Results  Component Value Date   ALBUMIN 2.4 (L) 08/27/2021   ALBUMIN 2.5 (L) 08/25/2021   ALBUMIN 2.6 (L) 06/05/2021    Lab Results  Component Value Date   MG 2.2 08/25/2021   MG 2.2 05/19/2021   MG 2.0 12/23/2020   No results found for: "VD25OH"  No results found for: "PREALBUMIN"    Latest Ref Rng & Units 08/28/2021    2:58 AM 08/27/2021    4:26 AM 08/26/2021    6:15 AM  CBC EXTENDED  WBC 4.0 - 10.5 K/uL 7.6  10.2  14.4   RBC 4.22 - 5.81 MIL/uL 3.97  3.98  4.12   Hemoglobin 13.0 - 17.0  g/dL 11.2  11.2  11.7   HCT 39.0 - 52.0 % 33.9  34.4  36.2   Platelets 150 - 400 K/uL 313  329  300      There is no height or weight on file to calculate BMI.  Orders:  No orders of the defined types were placed in this encounter.  Meds ordered this encounter  Medications   clobetasol cream (TEMOVATE) 0.05 %    Sig: Apply 1 Application topically 2 (two) times daily.    Dispense:  60 g    Refill:  0     Procedures: No procedures performed  Clinical Data: No additional findings.  ROS:  All other systems negative, except as noted in the HPI. Review of Systems  Objective: Vital Signs: There were no vitals taken for this visit.  Specialty Comments:  No specialty comments available.  PMFS History: Patient Active Problem List   Diagnosis Date Noted   Aortic  atherosclerosis ([place]) 09/04/2021   Hypercholesterolemia 09/04/2021   Unspecified atrial fibrillation ([place]) 08/25/2021   Acute on chronic diastolic CHF (congestive heart failure) ([place]) 08/25/2021   Wound of right leg, sequela 07/01/2021   Nail, injury by 06/10/2021   Laceration of leg 05/20/2021   Acute respiratory failure with hypoxia ([place]) 05/18/2021   Chronic combined systolic and diastolic heart failure ([place]) 05/18/2021   Chronic kidney disease (CKD), stage IV (severe) ([place]) 05/18/2021   Hypertension associated with diabetes ([place]) 05/18/2021   Insulin dependent type 2 diabetes mellitus ([place]) 05/18/2021   OSA on CPAP 05/18/2021   Laceration of right lower extremity 05/18/2021   Current chronic use of systemic steroids 01/08/2021   Preoperative cardiovascular examination 01/08/2021   Cellulitis 12/23/2020   Benign neoplasm of duodenum, jejunum, 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   Chronic kidney disease, stage 4 (severe) ([place]) 12/03/2020   Degeneration of lumbar intervertebral disc 12/03/2020   Type 2 diabetes mellitus with stage 4 chronic kidney disease ([place]) 12/03/2020   Diabetic peripheral neuropathy associated with type 2 diabetes mellitus ([place]) 64/33/2951   Diastolic heart failure ([place]) 12/03/2020   Enlarged prostate 12/03/2020   Erectile dysfunction due to arterial insufficiency 12/03/2020   Essential hypertension 12/03/2020   Gastro-esophageal reflux disease without esophagitis 12/03/2020   Hematuria 12/03/2020   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   Morbid obesity ([place]) 12/03/2020   Osteoarthritis of hip 12/03/2020   Osteoarthritis of knee 12/03/2020   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   Restless legs 12/03/2020   Skin sensation disturbance 12/03/2020   Lumbar stenosis 11/21/2020   Tendinitis 09/03/2020   Subacute arthropathy 09/03/2020   Bilateral hearing loss 08/11/2020   Impacted cerumen of left ear 03/10/2017    Excessive cerumen in left ear canal 03/10/2017   Faintness 12/09/2015   Backache 11/14/2014   CHRONIC PANCREATITIS 06/16/2009   Past Medical History:  Diagnosis Date   Arthritis    all joints   Chronic diastolic CHF (congestive heart failure) (HCC)    Chronic kidney disease (CKD), stage IV (severe) (HCC)    Chronic kidney disease, stage 3 (HCC)    Congestive heart failure with LV diastolic dysfunction, NYHA class 2 ([place])    Family history of adverse reaction to anesthesia    father- change in personaility   GERD (gastroesophageal reflux disease)    Hypertension    Hypertension associated with diabetes ([place])    Hypertensive chronic kidney disease    Insulin dependent type 2 diabetes mellitus ([place])  Neuropathy    OSA on CPAP    Sleep apnea    uses cpap, pt does not know settings    Family History  Problem Relation Age of Onset   Hypertension Mother    Anesthesia problems Neg Hx    Hypotension Neg Hx    Malignant hyperthermia Neg Hx    Pseudochol deficiency Neg Hx     Past Surgical History:  Procedure Laterality Date   ANTERIOR LAT LUMBAR FUSION Left 01/22/2021   Procedure: Lumbar one-Lumbar two Lateral Lumbar Interbody Fusion;  Surgeon: Vallarie Mare, MD;  Location: [facility];  Service: Neurosurgery;  Laterality: Left;   ANTERIOR LAT LUMBAR FUSION  01/22/2021   APPENDECTOMY     BACK SURGERY     4 surg, lower   CHOLECYSTECTOMY     COLONOSCOPY WITH PROPOFOL N/A 06/11/2014   Procedure: COLONOSCOPY WITH PROPOFOL;  Surgeon: Garlan Fair, MD;  [facility];  Service: Endoscopy;  Laterality: N/A;   ESOPHAGOGASTRODUODENOSCOPY  08/26/2011   Procedure: ESOPHAGOGASTRODUODENOSCOPY (EGD);  Surgeon: Garlan Fair, MD;  [facility];  Service: Endoscopy;  Laterality: N/A;   ESOPHAGOGASTRODUODENOSCOPY (EGD) WITH PROPOFOL N/A 06/11/2014   Procedure: ESOPHAGOGASTRODUODENOSCOPY (EGD) WITH PROPOFOL;  Surgeon: Garlan Fair, MD;  [facility];   Service: Endoscopy;  Laterality: N/A;   EUS  09/15/2011   Procedure: UPPER ENDOSCOPIC ULTRASOUND (EUS) LINEAR;  Surgeon: Landry Dyke, MD;  [facility];  Service: Endoscopy;  Laterality: N/A;  MAC   I & D EXTREMITY Right 05/20/2021   Procedure: IRRIGATION AND DEBRIDEMENT OF LEG AND APPLICATION OF SKIN GRAFT;  Surgeon: Newt Minion, MD;  Location: [facility];  Service: Orthopedics;  Laterality: Right;   I & D EXTREMITY Right 07/03/2021   Procedure: REPEAT RIGHT LEG DEBRIDEMENT;  Surgeon: Newt Minion, MD;  Location: [facility];  Service: Orthopedics;  Laterality: Right;   I & D EXTREMITY Right 07/01/2021   Procedure: RIGHT LEG DEBRIDEMENT;  Surgeon: Newt Minion, MD;  Location: [facility];  Service: Orthopedics;  Laterality: Right;   KNEE ARTHROSCOPY Right YEARS AGO   Social History   Occupational History   Occupation: retired  Tobacco Use   Smoking status: Former    Packs/day: 3.00    Years: 20.00    Total pack years: 60.00    Types: Cigarettes    Quit date: 1984    Years since quitting: 39.7   Smokeless tobacco: Never  Vaping Use   Vaping Use: Never used  Substance and Sexual Activity   Alcohol use: Not Currently   Drug use: Never   Sexual activity: Not on file

## 2022-04-26 ENCOUNTER — Telehealth: Payer: Self-pay | Admitting: Orthopedic Surgery

## 2022-04-26 NOTE — Telephone Encounter (Signed)
Pt called for a refill on clobetasol cream (TEMOVATE) 0.05 % [[id number]]. He states he needs a bigger prescription of it.

## 2022-04-27 MED ORDER — CLOBETASOL PROPIONATE 0.05 % EX CREA: 1.0000 | TOPICAL_CREAM | Freq: Two times a day (BID) | CUTANEOUS | 0 refills | Status: DC

## 2022-04-27 NOTE — Addendum Note (Signed)
Addended by: Suzan Slick on: 04/27/2022 08:32 AM   Modules accepted: Orders

## 2022-04-28 ENCOUNTER — Telehealth: Payer: Self-pay | Admitting: Family

## 2022-04-28 ENCOUNTER — Other Ambulatory Visit: Payer: Self-pay | Admitting: Family

## 2022-04-28 MED ORDER — CLOBETASOL PROPIONATE 0.05 % EX CREA: 1.0000 | TOPICAL_CREAM | Freq: Two times a day (BID) | CUTANEOUS | 0 refills | Status: DC

## 2022-04-28 NOTE — Telephone Encounter (Signed)
[facility] sent [facility] for [facility].

## 2022-04-28 NOTE — Telephone Encounter (Signed)
sent 

## 2022-04-28 NOTE — Telephone Encounter (Signed)
Patient called advised his Rx  for Clobetasol (Cream) is not showing received at [facility] in [facility]. Patient said he usually get a 90 day supply. Patient asked for a call back as soon as possible.   Patient said he will keep calling until he get his medication.  There number to contact patient is [phone]

## 2022-04-29 ENCOUNTER — Ambulatory Visit: Payer: Medicare Other | Admitting: Orthopedic Surgery

## 2022-05-06 ENCOUNTER — Telehealth: Payer: Self-pay | Admitting: Family

## 2022-05-06 NOTE — Telephone Encounter (Signed)
Pt called requesting a call back. Pt did not leave a reason for this call back. Pt phone number is [phone].

## 2022-05-06 NOTE — Telephone Encounter (Signed)
Pt states that he went to pick up his Clobetasol and the pharmacy states that they will not fill as it is too early per his insurance company and wanted to know if this was correct. I advised the pt that he will need to call his pharmacy and discuss with them. I see that we sent the rx to his pharm on 04/28/2022 but if insurance will cover it or not to fill today is information he will need to get for the pharmacy.

## 2022-05-10 ENCOUNTER — Ambulatory Visit (INDEPENDENT_AMBULATORY_CARE_PROVIDER_SITE_OTHER): Payer: Medicare Other | Admitting: Orthopedic Surgery

## 2022-05-10 DIAGNOSIS — S81801S Unspecified open wound, right lower leg, sequela: Secondary | ICD-10-CM

## 2022-05-11 ENCOUNTER — Encounter: Payer: Self-pay | Admitting: Orthopedic Surgery

## 2022-05-11 NOTE — Progress Notes (Signed)
Office Visit Note   Patient: Robert Dickerson           Date of Birth: Feb 15, 1941           MRN: [id number] Visit Date: 05/10/2022              Requested by: Kathalene Frames, MD [address], [place],  [place] [address] PCP: Kathalene Frames, MD  Chief Complaint  Patient presents with   Right Leg - Follow-up    07/03/2021 RLE deb and kerecis graft In office application 5/80/99, 8/33/82, 04/08/22 and 04/22/22      HPI: Patient is a 81 year old gentleman who presents in follow-up for chronic ulceration right lower extremity.  He initially underwent debridement and tissue grafting in December.  He has had an office tissue grafts 4 separate times.  He is currently wearing knee-high compression sock.  Patient states he has swelling that keeps him up at night.  He states that his sugars are running high.  Patient states he has neuropathy pain previously was on Lyrica but has been taken off it.  Assessment & Plan: Visit Diagnoses:  1. Leg wound, right, sequela     Plan: Recommended, glucose control, elevation, exercise, compression, and protein supplements.  Follow-Up Instructions: Return in about 4 weeks (around 06/07/2022).   Ortho Exam  Patient is alert, oriented, no adenopathy, well-dressed, normal affect, normal respiratory effort. Examination the wound bed is flat with 75% healthy granulation tissue.  The wound measures 4 x 5 cm and 2 mm deep.  There is no exposed bone or tendon.  Patient has pitting edema of his calf with brawny skin color changes.  Imaging: No results found.    Labs: Lab Results  Component Value Date   HGBA1C 8.2 (H) 08/25/2021   HGBA1C 7.6 (H) 05/19/2021   HGBA1C 9.5 (H) 12/23/2020   LABURIC 6.4 03/04/2011   REPTSTATUS 07/06/2021 FINAL 07/01/2021   GRAMSTAIN NO WBC SEEN NO ORGANISMS SEEN  07/01/2021   CULT  07/01/2021    FEW STAPHYLOCOCCUS AUREUS RARE PROTEUS MIRABILIS NO ANAEROBES ISOLATED Performed at [facility], [place] [address], [place], [place] [address]    [place] 07/01/2021   LABORGA PROTEUS MIRABILIS 07/01/2021     Lab Results  Component Value Date   ALBUMIN 2.4 (L) 08/27/2021   ALBUMIN 2.5 (L) 08/25/2021   ALBUMIN 2.6 (L) 06/05/2021    Lab Results  Component Value Date   MG 2.2 08/25/2021   MG 2.2 05/19/2021   MG 2.0 12/23/2020   No results found for: "VD25OH"  No results found for: "PREALBUMIN"    Latest Ref Rng & Units 08/28/2021    2:58 AM 08/27/2021    4:26 AM 08/26/2021    6:15 AM  CBC EXTENDED  WBC 4.0 - 10.5 K/uL 7.6  10.2  14.4   RBC 4.22 - 5.81 MIL/uL 3.97  3.98  4.12   Hemoglobin 13.0 - 17.0 g/dL 11.2  11.2  11.7   HCT 39.0 - 52.0 % 33.9  34.4  36.2   Platelets 150 - 400 K/uL 313  329  300      There is no height or weight on file to calculate BMI.  Orders:  No orders of the defined types were placed in this encounter.  No orders of the defined types were placed in this encounter.    Procedures: No procedures performed  Clinical Data: No additional findings.  ROS:  All other  systems negative, except as noted in the HPI. Review of Systems  Objective: Vital Signs: There were no vitals taken for this visit.  Specialty Comments:  No specialty comments available.  PMFS History: Patient Active Problem List   Diagnosis Date Noted   Aortic atherosclerosis ([place]) 09/04/2021   Hypercholesterolemia 09/04/2021   Unspecified atrial fibrillation ([place]) 08/25/2021   Acute on chronic diastolic CHF (congestive heart failure) ([place]) 08/25/2021   Wound of right leg, sequela 07/01/2021   Nail, injury by 06/10/2021   Laceration of leg 05/20/2021   Acute respiratory failure with hypoxia ([place]) 05/18/2021   Chronic combined systolic and diastolic heart failure ([place]) 05/18/2021   Chronic kidney disease (CKD), stage IV (severe) ([place]) 05/18/2021   Hypertension associated with diabetes ([place]) 05/18/2021   Insulin dependent type 2 diabetes  mellitus ([place]) 05/18/2021   OSA on CPAP 05/18/2021   Laceration of right lower extremity 05/18/2021   Current chronic use of systemic steroids 01/08/2021   Preoperative cardiovascular examination 01/08/2021   Cellulitis 12/23/2020   Benign neoplasm of duodenum, jejunum, 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   Chronic kidney disease, stage 4 (severe) ([place]) 12/03/2020   Degeneration of lumbar intervertebral disc 12/03/2020   Type 2 diabetes mellitus with stage 4 chronic kidney disease ([place]) 12/03/2020   Diabetic peripheral neuropathy associated with type 2 diabetes mellitus (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   Restless legs 12/03/2020   Skin sensation disturbance 12/03/2020   Lumbar stenosis 11/21/2020   Tendinitis 09/03/2020   Subacute arthropathy 09/03/2020   Bilateral hearing loss 08/11/2020   Impacted cerumen of left ear 03/10/2017   Excessive cerumen in left ear canal 03/10/2017   Faintness 12/09/2015   Backache 11/14/2014   CHRONIC PANCREATITIS 06/16/2009   Past Medical History:  Diagnosis Date   Arthritis    all joints   Chronic diastolic CHF (congestive heart failure) (HCC)    Chronic kidney disease (CKD), stage IV (severe) (HCC)    Chronic kidney disease, stage 3 (HCC)    Congestive heart failure with LV diastolic dysfunction, NYHA class 2 ([place])    Family history of adverse reaction to anesthesia    father- change in personaility   GERD (gastroesophageal reflux disease)    Hypertension    Hypertension  associated with diabetes ([place])    Hypertensive chronic kidney disease    Insulin dependent type 2 diabetes mellitus ([place])    Neuropathy    OSA on CPAP    Sleep apnea    uses cpap, pt does not know settings    Family History  Problem Relation Age of Onset   Hypertension Mother    Anesthesia problems Neg Hx    Hypotension Neg Hx    Malignant hyperthermia Neg Hx    Pseudochol deficiency Neg Hx     Past Surgical History:  Procedure Laterality Date   ANTERIOR LAT LUMBAR FUSION Left 01/22/2021   Procedure: Lumbar one-Lumbar two Lateral Lumbar Interbody Fusion;  Surgeon: Vallarie Mare, MD;  Location: [facility];  Service: Neurosurgery;  Laterality: Left;   ANTERIOR LAT LUMBAR FUSION  01/22/2021  APPENDECTOMY     BACK SURGERY     4 surg, lower   CHOLECYSTECTOMY     COLONOSCOPY WITH PROPOFOL N/A 06/11/2014   Procedure: COLONOSCOPY WITH PROPOFOL;  Surgeon: Garlan Fair, MD;  [facility];  Service: Endoscopy;  Laterality: N/A;   ESOPHAGOGASTRODUODENOSCOPY  08/26/2011   Procedure: ESOPHAGOGASTRODUODENOSCOPY (EGD);  Surgeon: Garlan Fair, MD;  [facility];  Service: Endoscopy;  Laterality: N/A;   ESOPHAGOGASTRODUODENOSCOPY (EGD) WITH PROPOFOL N/A 06/11/2014   Procedure: ESOPHAGOGASTRODUODENOSCOPY (EGD) WITH PROPOFOL;  Surgeon: Garlan Fair, MD;  [facility];  Service: Endoscopy;  Laterality: N/A;   EUS  09/15/2011   Procedure: UPPER ENDOSCOPIC ULTRASOUND (EUS) LINEAR;  Surgeon: Landry Dyke, MD;  [facility];  Service: Endoscopy;  Laterality: N/A;  MAC   I & D EXTREMITY Right 05/20/2021   Procedure: IRRIGATION AND DEBRIDEMENT OF LEG AND APPLICATION OF SKIN GRAFT;  Surgeon: Newt Minion, MD;  Location: [facility];  Service: Orthopedics;  Laterality: Right;   I & D EXTREMITY Right 07/03/2021   Procedure: REPEAT RIGHT LEG DEBRIDEMENT;  Surgeon: Newt Minion, MD;  Location: [facility];  Service: Orthopedics;  Laterality: Right;   I & D EXTREMITY  Right 07/01/2021   Procedure: RIGHT LEG DEBRIDEMENT;  Surgeon: Newt Minion, MD;  Location: [facility];  Service: Orthopedics;  Laterality: Right;   KNEE ARTHROSCOPY Right YEARS AGO   Social History   Occupational History   Occupation: retired  Tobacco Use   Smoking status: Former    Packs/day: 3.00    Years: 20.00    Total pack years: 60.00    Types: Cigarettes    Quit date: 1984    Years since quitting: 39.8   Smokeless tobacco: Never  Vaping Use   Vaping Use: Never used  Substance and Sexual Activity   Alcohol use: Not Currently   Drug use: Never   Sexual activity: Not on file

## 2022-05-24 DIAGNOSIS — I10 Essential (primary) hypertension: Secondary | ICD-10-CM | POA: Diagnosis not present

## 2022-05-24 DIAGNOSIS — N184 Chronic kidney disease, stage 4 (severe): Secondary | ICD-10-CM | POA: Diagnosis not present

## 2022-05-24 DIAGNOSIS — M10372 Gout due to renal impairment, left ankle and foot: Secondary | ICD-10-CM | POA: Diagnosis not present

## 2022-05-24 DIAGNOSIS — E0859 Diabetes mellitus due to underlying condition with other circulatory complications: Secondary | ICD-10-CM | POA: Diagnosis not present

## 2022-06-03 DIAGNOSIS — L97519 Non-pressure chronic ulcer of other part of right foot with unspecified severity: Secondary | ICD-10-CM | POA: Diagnosis not present

## 2022-06-03 DIAGNOSIS — I5042 Chronic combined systolic (congestive) and diastolic (congestive) heart failure: Secondary | ICD-10-CM | POA: Diagnosis not present

## 2022-06-03 DIAGNOSIS — N184 Chronic kidney disease, stage 4 (severe): Secondary | ICD-10-CM | POA: Diagnosis not present

## 2022-06-03 DIAGNOSIS — M25512 Pain in left shoulder: Secondary | ICD-10-CM | POA: Diagnosis not present

## 2022-06-03 DIAGNOSIS — M1711 Unilateral primary osteoarthritis, right knee: Secondary | ICD-10-CM | POA: Diagnosis not present

## 2022-06-03 DIAGNOSIS — I5030 Unspecified diastolic (congestive) heart failure: Secondary | ICD-10-CM | POA: Diagnosis not present

## 2022-06-03 DIAGNOSIS — R252 Cramp and spasm: Secondary | ICD-10-CM | POA: Diagnosis not present

## 2022-06-07 ENCOUNTER — Encounter: Payer: Self-pay | Admitting: Orthopedic Surgery

## 2022-06-07 ENCOUNTER — Ambulatory Visit (INDEPENDENT_AMBULATORY_CARE_PROVIDER_SITE_OTHER): Payer: Medicare Other | Admitting: Orthopedic Surgery

## 2022-06-07 DIAGNOSIS — S81801S Unspecified open wound, right lower leg, sequela: Secondary | ICD-10-CM | POA: Diagnosis not present

## 2022-06-07 DIAGNOSIS — I739 Peripheral vascular disease, unspecified: Secondary | ICD-10-CM | POA: Diagnosis not present

## 2022-06-07 DIAGNOSIS — Z945 Skin transplant status: Secondary | ICD-10-CM | POA: Diagnosis not present

## 2022-06-07 NOTE — Progress Notes (Signed)
Office Visit Note   Patient: Robert Dickerson           Date of Birth: [birth year]           MRN: [id number] Visit Date: 06/07/2022              Requested by: Kathalene Frames, MD [address], [address] [place],  [place] [id number] PCP: Kathalene Frames, MD  Chief Complaint  Patient presents with   Right Leg - Follow-up    07/03/2021 debridement of right leg with graft Multiple in office graft applications.       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  Patient has also had multiple applications of an office graft.  He is wearing a compression sock.  Patient states he has been having increasing pain in the right lower extremity with elevation.  He states his foot feels better on the floor.  Assessment & Plan: Visit Diagnoses:  1. History of skin graft   2. Leg wound, right, sequela   3. PVD (peripheral vascular disease) ([place])     Plan: We will make referral to vascular vein surgery for arterial intervention of the right lower extremity.  Continue with compression reevaluate in 4 weeks  Follow-Up Instructions: Return in about 4 weeks (around 07/05/2022).   Ortho Exam  Patient is alert, oriented, no adenopathy, well-dressed, normal affect, normal respiratory effort. Examination patient has brawny skin color changes in the right lower extremity there were no ischemic ulcers in the foot.  He has a thready palpable dorsalis pedis pulse.  The medial calf ulcer has healthy granulation tissue and is 4 x 4 cm and 1 mm deep.  Imaging: No results found. No images are attached to the encounter.  Labs: Lab Results  Component Value Date   HGBA1C 8.2 (H) 08/25/2021   HGBA1C 7.6 (H) 05/19/2021   HGBA1C 9.5 (H) 12/23/2020   LABURIC 6.4 03/04/2011   REPTSTATUS 07/06/2021 FINAL 07/01/2021   GRAMSTAIN NO WBC SEEN NO ORGANISMS SEEN   07/01/2021   CULT  07/01/2021    FEW STAPHYLOCOCCUS AUREUS RARE PROTEUS MIRABILIS NO ANAEROBES ISOLATED Performed at [facility], [place] [address], [place], [place] [address]    [place] 07/01/2021   LABORGA PROTEUS MIRABILIS 07/01/2021     Lab Results  Component Value Date   ALBUMIN 2.4 (L) 08/27/2021   ALBUMIN 2.5 (L) 08/25/2021   ALBUMIN 2.6 (L) 06/05/2021    Lab Results  Component Value Date   MG 2.2 08/25/2021   MG 2.2 05/19/2021   MG 2.0 12/23/2020   No results found for: "VD25OH"  No results found for: "PREALBUMIN"    Latest Ref Rng & Units 08/28/2021    2:58 AM 08/27/2021    4:26 AM 08/26/2021    6:15 AM  CBC EXTENDED  WBC 4.0 - 10.5 K/uL 7.6  10.2  14.4   RBC 4.22 - 5.81 MIL/uL 3.97  3.98  4.12   Hemoglobin 13.0 - 17.0 g/dL 11.2  11.2  11.7   HCT 39.0 - 52.0 % 33.9  34.4  36.2   Platelets 150 - 400 K/uL 313  329  300      There is no height or weight on file to calculate BMI.  Orders:  Orders Placed This Encounter  Procedures   Ambulatory referral to Vascular Surgery   No  orders of the defined types were placed in this encounter.    Procedures: No procedures performed  Clinical Data: No additional findings.  ROS:  All other systems negative, except as noted in the HPI. Review of Systems  Objective: Vital Signs: There were no vitals taken for this visit.  Specialty Comments:  No specialty comments available.  PMFS History: Patient Active Problem List   Diagnosis Date Noted   Aortic atherosclerosis ([place]) 09/04/2021   Hypercholesterolemia 09/04/2021   Unspecified atrial fibrillation ([place]) 08/25/2021   Acute on chronic diastolic CHF (congestive heart failure) ([place]) 08/25/2021   Wound of right leg, sequela 07/01/2021   Nail, injury by 06/10/2021   Laceration of leg 05/20/2021   Acute respiratory failure with hypoxia ([place]) 05/18/2021   Chronic combined systolic and diastolic heart failure ([place]) 05/18/2021    Chronic kidney disease (CKD), stage IV (severe) ([place]) 05/18/2021   Hypertension associated with diabetes ([place]) 05/18/2021   Insulin dependent type 2 diabetes mellitus ([place]) 05/18/2021   OSA on CPAP 05/18/2021   Laceration of right lower extremity 05/18/2021   Current chronic use of systemic steroids 01/08/2021   Preoperative cardiovascular examination 01/08/2021   Cellulitis 12/23/2020   Benign neoplasm of duodenum, jejunum, 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   Chronic kidney disease, stage 4 (severe) ([place]) 12/03/2020   Degeneration of lumbar intervertebral disc 12/03/2020   Type 2 diabetes mellitus with stage 4 chronic kidney disease ([place]) 12/03/2020   Diabetic peripheral neuropathy associated with type 2 diabetes mellitus ([place]) 95/28/4132   Diastolic heart failure ([place]) 12/03/2020   Enlarged prostate 12/03/2020   Erectile dysfunction due to arterial insufficiency 12/03/2020   Essential hypertension 12/03/2020   Gastro-esophageal reflux disease without esophagitis 12/03/2020   Hematuria 12/03/2020   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   Morbid obesity ([place]) 12/03/2020   Osteoarthritis of hip 12/03/2020   Osteoarthritis of knee 12/03/2020   Polymyalgia rheumatica ([place]) 12/03/2020   Restless legs 12/03/2020   Skin sensation disturbance 12/03/2020   Lumbar stenosis 11/21/2020   Tendinitis 09/03/2020   Subacute arthropathy 09/03/2020   Bilateral hearing loss 08/11/2020   Impacted cerumen of left ear 03/10/2017   Excessive cerumen in left ear canal 03/10/2017   Faintness 12/09/2015   Backache 11/14/2014   CHRONIC PANCREATITIS 06/16/2009   Past Medical History:  Diagnosis Date   Arthritis    all joints   Chronic diastolic CHF (congestive heart failure) ([facility])    Chronic kidney disease (CKD), stage IV (severe) ([facility])    Chronic kidney disease, stage 3 (H[facility])    Congestive heart failure with LV diastolic dysfunction, NYHA class 2 ([place])     Family history of adverse reaction to anesthesia    father- change in personaility   GERD (gastroesophageal reflux disease)    Hypertension    Hypertension associated with diabetes ([place])    Hypertensive chronic kidney disease    Insulin dependent type 2 diabetes mellitus ([facility])    Neuropathy    OSA on CPAP    Sleep apnea    uses cpap, pt does not know settings    Family History  Problem Relation Age of Onset   Hypertension Mother    Anesthesia problems Neg Hx    Hypotension Neg Hx    Malignant hyperthermia Neg Hx    Pseudochol deficiency Neg Hx     Past Surgical History:  Procedure Laterality Date   ANTERIOR LAT LUMBAR FUSION Left 01/22/2021   Procedure: Lumbar one-Lumbar two Lateral  Lumbar Interbody Fusion;  Surgeon: Vallarie Mare, MD;  Location: [facility];  Service: Neurosurgery;  Laterality: Left;   ANTERIOR LAT LUMBAR FUSION  01/22/2021   APPENDECTOMY     BACK SURGERY     4 surg, lower   CHOLECYSTECTOMY     COLONOSCOPY WITH PROPOFOL N/A 06/11/2014   Procedure: COLONOSCOPY WITH PROPOFOL;  Surgeon: Garlan Fair, MD;  Location: [facility];  Service: Endoscopy;  Laterality: N/A;   ESOPHAGOGASTRODUODENOSCOPY  08/26/2011   Procedure: ESOPHAGOGASTRODUODENOSCOPY (EGD);  Surgeon: Garlan Fair, MD;  Location: [facility];  Service: Endoscopy;  Laterality: N/A;   ESOPHAGOGASTRODUODENOSCOPY (EGD) WITH PROPOFOL N/A 06/11/2014   Procedure: ESOPHAGOGASTRODUODENOSCOPY (EGD) WITH PROPOFOL;  Surgeon: Garlan Fair, MD;  Location: [facility];  Service: Endoscopy;  Laterality: N/A;   EUS  09/15/2011   Procedure: UPPER ENDOSCOPIC ULTRASOUND (EUS) LINEAR;  Surgeon: Landry Dyke, MD;  Location: [facility];  Service: Endoscopy;  Laterality: N/A;  MAC   I & D EXTREMITY Right 05/20/2021   Procedure: IRRIGATION AND DEBRIDEMENT OF LEG AND APPLICATION OF SKIN GRAFT;  Surgeon: Newt Minion, MD;  Location: [facility];  Service: Orthopedics;  Laterality: Right;   I & D EXTREMITY Right  07/03/2021   Procedure: REPEAT RIGHT LEG DEBRIDEMENT;  Surgeon: Newt Minion, MD;  Location: [facility];  Service: Orthopedics;  Laterality: Right;   I & D EXTREMITY Right 07/01/2021   Procedure: RIGHT LEG DEBRIDEMENT;  Surgeon: Newt Minion, MD;  Location: [facility];  Service: Orthopedics;  Laterality: Right;   KNEE ARTHROSCOPY Right YEARS AGO   Social History   Occupational History   Occupation: retired  Tobacco Use   Smoking status: Former    Packs/day: 3.00    Years: 20.00    Total pack years: 60.00    Types: Cigarettes    Quit date: 1984    Years since quitting: 39.8   Smokeless tobacco: Never  Vaping Use   Vaping Use: Never used  Substance and Sexual Activity   Alcohol use: Not Currently   Drug use: Never   Sexual activity: Not on file

## 2022-06-10 ENCOUNTER — Telehealth: Payer: Self-pay | Admitting: Orthopedic Surgery

## 2022-06-10 NOTE — Telephone Encounter (Signed)
Received call from patient and his wife, Robert Dickerson. Stated they are coming to town on Monday and would like to get copy of medical records from 05/2021 - present. Advised he needs to sign authorization when comes to pick up. Copy of records are ready and auth attached for patient to sign. Ph [phone]

## 2022-06-11 ENCOUNTER — Ambulatory Visit
Admission: RE | Admit: 2022-06-11 | Discharge: 2022-06-11 | Disposition: A | Discharge status: Home / Self Care | Payer: Medicare Other | Source: Ambulatory Visit | Attending: Internal Medicine | Admitting: Internal Medicine

## 2022-06-11 ENCOUNTER — Other Ambulatory Visit: Payer: Self-pay | Admitting: Internal Medicine

## 2022-06-11 DIAGNOSIS — S20212A Contusion of left front wall of thorax, initial encounter: Secondary | ICD-10-CM | POA: Diagnosis not present

## 2022-06-11 DIAGNOSIS — I129 Hypertensive chronic kidney disease with stage 1 through stage 4 chronic kidney disease, or unspecified chronic kidney disease: Secondary | ICD-10-CM | POA: Diagnosis not present

## 2022-06-11 DIAGNOSIS — E0869 Diabetes mellitus due to underlying condition with other specified complication: Secondary | ICD-10-CM | POA: Diagnosis not present

## 2022-06-11 DIAGNOSIS — S50819A Abrasion of unspecified forearm, initial encounter: Secondary | ICD-10-CM | POA: Diagnosis not present

## 2022-06-11 DIAGNOSIS — I700 Atherosclerosis of aorta: Secondary | ICD-10-CM | POA: Diagnosis not present

## 2022-06-11 DIAGNOSIS — I5030 Unspecified diastolic (congestive) heart failure: Secondary | ICD-10-CM | POA: Diagnosis not present

## 2022-06-17 ENCOUNTER — Other Ambulatory Visit: Payer: Self-pay | Admitting: *Deleted

## 2022-06-17 DIAGNOSIS — I739 Peripheral vascular disease, unspecified: Secondary | ICD-10-CM

## 2022-06-18 ENCOUNTER — Encounter: Payer: Self-pay | Admitting: Podiatry

## 2022-06-18 ENCOUNTER — Ambulatory Visit (INDEPENDENT_AMBULATORY_CARE_PROVIDER_SITE_OTHER): Payer: Medicare Other | Admitting: Podiatry

## 2022-06-18 DIAGNOSIS — E1142 Type 2 diabetes mellitus with diabetic polyneuropathy: Secondary | ICD-10-CM | POA: Diagnosis not present

## 2022-06-18 DIAGNOSIS — M79674 Pain in right toe(s): Secondary | ICD-10-CM | POA: Diagnosis not present

## 2022-06-18 DIAGNOSIS — B351 Tinea unguium: Secondary | ICD-10-CM | POA: Diagnosis not present

## 2022-06-18 DIAGNOSIS — S50819D Abrasion of unspecified forearm, subsequent encounter: Secondary | ICD-10-CM | POA: Diagnosis not present

## 2022-06-18 DIAGNOSIS — N184 Chronic kidney disease, stage 4 (severe): Secondary | ICD-10-CM

## 2022-06-18 DIAGNOSIS — M79675 Pain in left toe(s): Secondary | ICD-10-CM | POA: Diagnosis not present

## 2022-06-18 NOTE — Progress Notes (Signed)
This patient returns to the office for evaluation and treatment of long thick painful nails .  This patient is unable to trim his own nails since the patient cannot reach his feet.  Patient says the nails are painful walking and wearing his shoes. Patient has history of back surgery. Patient was in [facility] in October 2022.    He returns for preventive foot care services.   General Appearance  Alert, conversant and in no acute stress.  Vascular  Dorsalis pedis and posterior tibial  pulses are palpable  bilaterally.  Capillary return is within normal limits  bilaterally. Temperature is within normal limits  bilaterally.  Neurologic  Senn-Weinstein monofilament wire test diminished  bilaterally. Muscle power within normal limits bilaterally.  Nails Thick disfigured discolored nails with subungual debris  from hallux to second  toes bilaterally. No evidence of bacterial infection or drainage bilaterally.Lateral distal aspect of the second toenail is absent.  Orthopedic  No limitations of motion  feet .  No crepitus or effusions noted.  No bony pathology or digital deformities noted. Prominent metatarsal heads  B/L.  Skin  Dry scaly  skin with no porokeratosis noted bilaterally.  No signs of infections or ulcers noted.     Onychomycosis  Pain in toes right foot  Pain in toes left foot    Debridement  of nails  1-5  B/L with a nail nipper.  Nails were then filed using a dremel tool with no incidents.   RTC 3 months    Robert Dickerson DPM   

## 2022-06-28 DIAGNOSIS — N184 Chronic kidney disease, stage 4 (severe): Secondary | ICD-10-CM | POA: Diagnosis not present

## 2022-06-28 DIAGNOSIS — M170 Bilateral primary osteoarthritis of knee: Secondary | ICD-10-CM | POA: Diagnosis not present

## 2022-06-29 ENCOUNTER — Encounter (HOSPITAL_COMMUNITY): Payer: Medicare Other

## 2022-06-29 ENCOUNTER — Ambulatory Visit: Payer: Medicare Other

## 2022-07-02 ENCOUNTER — Encounter: Payer: Self-pay | Admitting: Cardiovascular Disease

## 2022-07-02 ENCOUNTER — Ambulatory Visit: Payer: Medicare Other | Attending: Cardiovascular Disease | Admitting: Cardiovascular Disease

## 2022-07-02 VITALS — BP 118/62 | HR 74 | Ht 69.0 in | Wt 229.2 lb

## 2022-07-02 DIAGNOSIS — I700 Atherosclerosis of aorta: Secondary | ICD-10-CM | POA: Diagnosis not present

## 2022-07-02 DIAGNOSIS — I739 Peripheral vascular disease, unspecified: Secondary | ICD-10-CM | POA: Insufficient documentation

## 2022-07-02 DIAGNOSIS — E7800 Pure hypercholesterolemia, unspecified: Secondary | ICD-10-CM | POA: Diagnosis not present

## 2022-07-02 DIAGNOSIS — I10 Essential (primary) hypertension: Secondary | ICD-10-CM | POA: Insufficient documentation

## 2022-07-02 DIAGNOSIS — Z7952 Long term (current) use of systemic steroids: Secondary | ICD-10-CM | POA: Insufficient documentation

## 2022-07-02 DIAGNOSIS — I5042 Chronic combined systolic (congestive) and diastolic (congestive) heart failure: Secondary | ICD-10-CM | POA: Diagnosis not present

## 2022-07-02 DIAGNOSIS — I491 Atrial premature depolarization: Secondary | ICD-10-CM | POA: Diagnosis not present

## 2022-07-02 DIAGNOSIS — N184 Chronic kidney disease, stage 4 (severe): Secondary | ICD-10-CM | POA: Diagnosis not present

## 2022-07-02 DIAGNOSIS — E1122 Type 2 diabetes mellitus with diabetic chronic kidney disease: Secondary | ICD-10-CM | POA: Insufficient documentation
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
## 2022-07-02 NOTE — Patient Instructions (Signed)
Medication Instructions:  Your physician recommends that you continue on your current medications as directed. Please refer to the Current Medication list given to you today.  *If you need a refill on your cardiac medications before your next appointment, please call your pharmacy*  Lab Work: NONE ordered at this time of appointment   If you have labs (blood work) drawn today and your tests are completely normal, you will receive your results only by: MyChart Message (if you have MyChart) OR A paper copy in the mail If you have any lab test that is abnormal or we need to change your treatment, we will call you to review the results.   Testing/Procedures: NONE ordered at this time of appointment   Follow-Up: At [facility], you and your health needs are our priority.  As part of our continuing mission to provide you with exceptional heart care, we have created designated Provider Care Teams.  These Care Teams include your primary Cardiologist (physician) and Advanced Practice Providers (APPs -  Physician Assistants and Nurse Practitioners) who all work together to provide you with the care you need, when you need it.  Your next appointment:   1 year(s)  The format for your next appointment:   In Person  Provider:   Mihai Croitoru, MD     Other Instructions  Important Information About Sugar       

## 2022-07-05 ENCOUNTER — Encounter: Payer: Self-pay | Admitting: Orthopedic Surgery

## 2022-07-05 ENCOUNTER — Ambulatory Visit (INDEPENDENT_AMBULATORY_CARE_PROVIDER_SITE_OTHER): Payer: Medicare Other | Admitting: Orthopedic Surgery

## 2022-07-05 ENCOUNTER — Ambulatory Visit (HOSPITAL_COMMUNITY)
Admission: RE | Admit: 2022-07-05 | Discharge: 2022-07-05 | Disposition: A | Discharge status: Home / Self Care | Payer: Medicare Other | Source: Ambulatory Visit | Attending: Physician Assistant | Admitting: Physician Assistant

## 2022-07-05 DIAGNOSIS — M170 Bilateral primary osteoarthritis of knee: Secondary | ICD-10-CM | POA: Diagnosis not present

## 2022-07-05 DIAGNOSIS — I739 Peripheral vascular disease, unspecified: Secondary | ICD-10-CM

## 2022-07-05 DIAGNOSIS — Z945 Skin transplant status: Secondary | ICD-10-CM

## 2022-07-05 DIAGNOSIS — S81801S Unspecified open wound, right lower leg, sequela: Secondary | ICD-10-CM

## 2022-07-05 MED ORDER — CLOBETASOL PROPIONATE 0.05 % EX CREA: 1.0000 | TOPICAL_CREAM | Freq: Two times a day (BID) | CUTANEOUS | 0 refills | Status: DC

## 2022-07-05 NOTE — Progress Notes (Signed)
Office Visit Note   Patient: Robert Dickerson           Date of Birth: 22-Nov-1940           MRN: [id number] Visit Date: 07/05/2022              Requested by: Kathalene Frames, MD [address], [place] [address] [place],  [place] [id number] PCP: Kathalene Frames, MD  Chief Complaint  Patient presents with   Right Leg - Wound Check    07/03/2021 debridement of right leg with graft Multiple in office graft applications.      HPI: Patient is an 81 year old gentleman who presents in follow-up for traumatic venous ulcer medial right calf.  Assessment & Plan: Visit Diagnoses:  1. PVD (peripheral vascular disease) (HCC)   2. Leg wound, right, sequela   3. History of skin graft     Plan: Patient will continue with Dial soap cleansing compression dressing.  Prescription called in for clobetasol.  Follow-Up Instructions: Return in about 4 weeks (around 08/02/2022).   Ortho Exam  Patient is alert, oriented, no adenopathy, well-dressed, normal affect, normal respiratory effort. Examination the wound has healthy granulation tissue the scab was removed around the edges there is 100% bleeding granulation tissue after gentle debridement.  The wound measures 25 x 35 mm and is 1 mm deep.  Imaging: No results found.    Labs: Lab Results  Component Value Date   HGBA1C 8.2 (H) 08/25/2021   HGBA1C 7.6 (H) 05/19/2021   HGBA1C 9.5 (H) 12/23/2020   LABURIC 6.4 03/04/2011   REPTSTATUS 07/06/2021 FINAL 07/01/2021   GRAMSTAIN NO WBC SEEN NO ORGANISMS SEEN  07/01/2021   CULT  07/01/2021    FEW STAPHYLOCOCCUS AUREUS RARE PROTEUS MIRABILIS NO ANAEROBES ISOLATED Performed at [facility], [place] [address], [place], [place] [address]    [place] 07/01/2021   LABORGA PROTEUS MIRABILIS 07/01/2021     Lab Results  Component Value Date   ALBUMIN 2.4 (L) 08/27/2021   ALBUMIN 2.5 (L) 08/25/2021   ALBUMIN 2.6 (L) 06/05/2021    Lab Results  Component  Value Date   MG 2.2 08/25/2021   MG 2.2 05/19/2021   MG 2.0 12/23/2020   No results found for: "VD25OH"  No results found for: "PREALBUMIN"    Latest Ref Rng & Units 08/28/2021    2:58 AM 08/27/2021    4:26 AM 08/26/2021    6:15 AM  CBC EXTENDED  WBC 4.0 - 10.5 K/uL 7.6  10.2  14.4   RBC 4.22 - 5.81 MIL/uL 3.97  3.98  4.12   Hemoglobin 13.0 - 17.0 g/dL 11.2  11.2  11.7   HCT 39.0 - 52.0 % 33.9  34.4  36.2   Platelets 150 - 400 K/uL 313  329  300      There is no height or weight on file to calculate BMI.  Orders:  No orders of the defined types were placed in this encounter.  Meds ordered this encounter  Medications   clobetasol cream (TEMOVATE) 0.05 %    Sig: Apply 1 Application topically 2 (two) times daily.    Dispense:  400 g    Refill:  0     Procedures: No procedures performed  Clinical Data: No additional findings.  ROS:  All other systems negative, except as noted in the HPI. Review of Systems  Objective: Vital Signs: There were no vitals taken for this visit.  Specialty Comments:  No specialty comments available.  PMFS History: Patient Active Problem List   Diagnosis Date Noted   Aortic atherosclerosis ([place]) 09/04/2021   Hypercholesterolemia 09/04/2021   Unspecified atrial fibrillation ([place]) 08/25/2021   Acute on chronic diastolic CHF (congestive heart failure) ([place]) 08/25/2021   Wound of right leg, sequela 07/01/2021   Nail, injury by 06/10/2021   Laceration of leg 05/20/2021   Acute respiratory failure with hypoxia ([place]) 05/18/2021   Chronic combined systolic and diastolic heart failure ([place]) 05/18/2021   Chronic kidney disease (CKD), stage IV (severe) ([place]) 05/18/2021   Hypertension associated with diabetes ([place]) 05/18/2021   Insulin dependent type 2 diabetes mellitus ([place]) 05/18/2021   OSA on CPAP 05/18/2021   Laceration of right lower extremity 05/18/2021   Current chronic use of systemic steroids 01/08/2021   Preoperative  cardiovascular examination 01/08/2021   Cellulitis 12/23/2020   Benign neoplasm of duodenum, jejunum, 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   Chronic kidney disease, stage 4 (severe) ([place]) 12/03/2020   Degeneration of lumbar intervertebral disc 12/03/2020   Type 2 diabetes mellitus with stage 4 chronic kidney disease ([place]) 12/03/2020   Diabetic peripheral neuropathy associated with type 2 diabetes mellitus ([place]) 22/09/5425   Diastolic heart failure ([place]) 12/03/2020   Enlarged prostate 12/03/2020   Erectile dysfunction due to arterial insufficiency 12/03/2020   Essential hypertension 12/03/2020   Gastro-esophageal reflux disease without esophagitis 12/03/2020   Hematuria 12/03/2020   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   Morbid obesity ([place]) 12/03/2020   Osteoarthritis of hip 12/03/2020   Osteoarthritis of knee 12/03/2020   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   Restless legs 12/03/2020   Skin sensation disturbance 12/03/2020   Lumbar stenosis 11/21/2020   Tendinitis 09/03/2020   Subacute arthropathy 09/03/2020   Bilateral hearing loss 08/11/2020   Impacted cerumen of left ear 03/10/2017   Excessive cerumen in left ear canal 03/10/2017   Faintness 12/09/2015   Backache 11/14/2014   CHRONIC PANCREATITIS 06/16/2009   Past Medical History:  Diagnosis Date   Arthritis    all joints   Chronic diastolic CHF (congestive heart failure) (HCC)    Chronic kidney disease (CKD), stage IV (severe) (HCC)    Chronic kidney disease, stage 3 (HCC)    Congestive heart failure with LV diastolic dysfunction, NYHA class 2 ([place])    Family history of adverse reaction to anesthesia    father- change in personaility   GERD (gastroesophageal reflux disease)    Hypertension    Hypertension associated with diabetes ([place])    Hypertensive chronic kidney disease    Insulin dependent type 2 diabetes mellitus ([place])    Neuropathy    OSA on CPAP    Sleep apnea     uses cpap, pt does not know settings    Family History  Problem Relation Age of Onset   Hypertension Mother    Anesthesia problems Neg Hx    Hypotension Neg Hx    Malignant hyperthermia Neg Hx    Pseudochol deficiency Neg Hx     Past Surgical History:  Procedure Laterality Date   ANTERIOR LAT LUMBAR FUSION Left 01/22/2021   Procedure: Lumbar one-Lumbar two Lateral Lumbar Interbody Fusion;  Surgeon: Vallarie Mare, MD;  Location: [facility] [facility];  Service: Neurosurgery;  Laterality: Left;   ANTERIOR LAT LUMBAR FUSION  01/22/2021   APPENDECTOMY     BACK SURGERY     4 surg, lower   CHOLECYSTECTOMY     COLONOSCOPY WITH PROPOFOL N/A 06/11/2014  Procedure: COLONOSCOPY WITH PROPOFOL;  Surgeon: Garlan Fair, MD;  Location: [facility];  Service: Endoscopy;  Laterality: N/A;   ESOPHAGOGASTRODUODENOSCOPY  08/26/2011   Procedure: ESOPHAGOGASTRODUODENOSCOPY (EGD);  Surgeon: Garlan Fair, MD;  Location: [facility];  Service: Endoscopy;  Laterality: N/A;   ESOPHAGOGASTRODUODENOSCOPY (EGD) WITH PROPOFOL N/A 06/11/2014   Procedure: ESOPHAGOGASTRODUODENOSCOPY (EGD) WITH PROPOFOL;  Surgeon: Garlan Fair, MD;  [facility]: [facility];  Service: Endoscopy;  Laterality: N/A;   EUS  09/15/2011   Procedure: UPPER ENDOSCOPIC ULTRASOUND (EUS) LINEAR;  Surgeon: Landry Dyke, MD;  Location: [facility];  Service: Endoscopy;  Laterality: N/A;  MAC   I & D EXTREMITY Right 05/20/2021   Procedure: IRRIGATION AND DEBRIDEMENT OF LEG AND APPLICATION OF SKIN GRAFT;  Surgeon: Newt Minion, MD;  Location: [facility];  Service: Orthopedics;  Laterality: Right;   I & D EXTREMITY Right 07/03/2021   Procedure: REPEAT RIGHT LEG DEBRIDEMENT;  Surgeon: Newt Minion, MD;  Location: [facility];  Service: Orthopedics;  Laterality: Right;   I & D EXTREMITY Right 07/01/2021   Procedure: RIGHT LEG DEBRIDEMENT;  Surgeon: Newt Minion, MD;  Location: [facility];  Service: Orthopedics;  Laterality: Right;   KNEE ARTHROSCOPY Right  YEARS AGO   Social History   Occupational History   Occupation: retired  Tobacco Use   Smoking status: Former    Packs/day: 3.00    Years: 20.00    Total pack years: 60.00    Types: Cigarettes    Quit date: 1984    Years since quitting: 39.9   Smokeless tobacco: Never  Vaping Use   Vaping Use: Never used  Substance and Sexual Activity   Alcohol use: Not Currently   Drug use: Never   Sexual activity: Not on file

## 2022-07-09 ENCOUNTER — Ambulatory Visit (INDEPENDENT_AMBULATORY_CARE_PROVIDER_SITE_OTHER): Payer: Medicare Other | Admitting: Vascular Surgery

## 2022-07-09 ENCOUNTER — Encounter: Payer: Self-pay | Admitting: Vascular Surgery

## 2022-07-09 ENCOUNTER — Other Ambulatory Visit: Payer: Self-pay | Admitting: Neurosurgery

## 2022-07-09 VITALS — BP 123/66 | HR 86 | Temp 98.2°F | Resp 20 | Ht 69.0 in | Wt 236.0 lb

## 2022-07-09 DIAGNOSIS — L98499 Non-pressure chronic ulcer of skin of other sites with unspecified severity: Secondary | ICD-10-CM | POA: Diagnosis not present

## 2022-07-09 DIAGNOSIS — Z6827 Body mass index (BMI) 27.0-27.9, adult: Secondary | ICD-10-CM | POA: Diagnosis not present

## 2022-07-09 DIAGNOSIS — N184 Chronic kidney disease, stage 4 (severe): Secondary | ICD-10-CM | POA: Diagnosis not present

## 2022-07-09 DIAGNOSIS — I5020 Unspecified systolic (congestive) heart failure: Secondary | ICD-10-CM | POA: Diagnosis not present

## 2022-07-09 DIAGNOSIS — I739 Peripheral vascular disease, unspecified: Secondary | ICD-10-CM | POA: Diagnosis not present

## 2022-07-09 DIAGNOSIS — L97919 Non-pressure chronic ulcer of unspecified part of right lower leg with unspecified severity: Secondary | ICD-10-CM | POA: Diagnosis not present

## 2022-07-09 DIAGNOSIS — Z981 Arthrodesis status: Secondary | ICD-10-CM

## 2022-07-09 DIAGNOSIS — M5136 Other intervertebral disc degeneration, lumbar region: Secondary | ICD-10-CM | POA: Diagnosis not present

## 2022-07-09 DIAGNOSIS — N1832 Chronic kidney disease, stage 3b: Secondary | ICD-10-CM | POA: Diagnosis not present

## 2022-07-09 DIAGNOSIS — L299 Pruritus, unspecified: Secondary | ICD-10-CM | POA: Diagnosis not present

## 2022-07-09 NOTE — Progress Notes (Incomplete)
Office Note    HPI: Robert Dickerson is a 81 y.o. (1940/09/18) male presenting in follow-up at the request of Dr. Meridee Score for slow-healing right leg wound.  He was seen roughly 1 year ago with a large traumatic wound on his right leg from a hematoma that required debridement.  He also had skin avulsion injuries on his right arm.  The right arm has completely healed, however the right lower extremity wound has been slow to heal.   On exam, Robert Dickerson was doing well.  Overall, Robert Dickerson has appreciated slow, but consistent wound healing.  He has been wearing compression socks on a regular basis per Dr. Jess Barters request which she has found beneficial.  He denies drainage, patient is a longstanding diabetic with neuropathy in bilateral feet.  He denies previous wounds, claudication, or rest pain.  The pt is not on a statin for cholesterol management.  The pt is not on a daily aspirin.   Other AC:  - The pt is  on medication for hypertension.   The pt is  diabetic.  Tobacco hx:  former  Past Medical History:  Diagnosis Date   Arthritis    all joints   Chronic diastolic CHF (congestive heart failure) (HCC)    Chronic kidney disease (CKD), stage IV (severe) (HCC)    Chronic kidney disease, stage 3 (HCC)    Congestive heart failure with LV diastolic dysfunction, NYHA class 2 ([place])    Family history of adverse reaction to anesthesia    father- change in personaility   GERD (gastroesophageal reflux disease)    Hypertension    Hypertension associated with diabetes ([place])    Hypertensive chronic kidney disease    Insulin dependent type 2 diabetes mellitus (HCC)    Neuropathy    OSA on CPAP    Sleep apnea    uses cpap, pt does not know settings    Past Surgical History:  Procedure Laterality Date   ANTERIOR LAT LUMBAR FUSION Left 01/22/2021   Procedure: Lumbar one-Lumbar two Lateral Lumbar Interbody Fusion;  Surgeon: Vallarie Mare, MD;  Location: [facility];  Service: Neurosurgery;  Laterality:  Left;   ANTERIOR LAT LUMBAR FUSION  01/22/2021   APPENDECTOMY     BACK SURGERY     4 surg, lower   CHOLECYSTECTOMY     COLONOSCOPY WITH PROPOFOL N/A 06/11/2014   Procedure: COLONOSCOPY WITH PROPOFOL;  Surgeon: Garlan Fair, MD;  [facility];  Service: Endoscopy;  Laterality: N/A;   ESOPHAGOGASTRODUODENOSCOPY  08/26/2011   Procedure: ESOPHAGOGASTRODUODENOSCOPY (EGD);  Surgeon: Garlan Fair, MD;  Location[facility];  Service: Endoscopy;  Laterality: N/A;   ESOPHAGOGASTRODUODENOSCOPY (EGD) WITH PROPOFOL N/A 06/11/2014   Procedure: ESOPHAGOGASTRODUODENOSCOPY (EGD) WITH PROPOFOL;  Surgeon: Garlan Fair, MD;  [facility];  Service: Endoscopy;  Laterality: N/A;   EUS  09/15/2011   Procedure: UPPER ENDOSCOPIC ULTRASOUND (EUS) LINEAR;  Surgeon: Landry Dyke, MD;  [facility];  Service: Endoscopy;  Laterality: N/A;  MAC   I & D EXTREMITY Right 05/20/2021   Procedure: IRRIGATION AND DEBRIDEMENT OF LEG AND APPLICATION OF SKIN GRAFT;  Surgeon: Newt Minion, MD;  Location: [facility];  Service: Orthopedics;  Laterality: Right;   I & D EXTREMITY Right 07/03/2021   Procedure: REPEAT RIGHT LEG DEBRIDEMENT;  Surgeon: Newt Minion, MD;  Location: [facility];  Service: Orthopedics;  Laterality: Right;   I & D EXTREMITY Right 07/01/2021   Procedure: RIGHT LEG DEBRIDEMENT;  Surgeon: Sharol Given,  Illene Regulus, MD;  Location: [facility];  Service: Orthopedics;  Laterality: Right;   KNEE ARTHROSCOPY Right YEARS AGO    Social History   Socioeconomic History   Marital status: Married    Spouse name: Robert Dickerson   Number of children: Not on file   Years of education: Not on file   Highest education level: Not on file  Occupational History   Occupation: retired  Tobacco Use   Smoking status: Former    Packs/day: 3.00    Years: 20.00    Total pack years: 60.00    Types: Cigarettes    Quit date: 1984    Years since quitting: 39.9   Smokeless tobacco: Never  Vaping Use   Vaping Use:  Never used  Substance and Sexual Activity   Alcohol use: Not Currently   Drug use: Never   Sexual activity: Not on file  Other Topics Concern   Not on file  Social History Narrative   ** Merged History Encounter **       Social Determinants of Health   Financial Resource Strain: Low Risk  (08/27/2021)   Overall Financial Resource Strain (CARDIA)    Difficulty of Paying Living Expenses: Not hard at all  Food Insecurity: No Food Insecurity (08/27/2021)   Hunger Vital Sign    Worried About Running Out of Food in the Last Year: Never true    [place] in the Last Year: Never true  Transportation Needs: No Transportation Needs (08/27/2021)   PRAPARE - Hydrologist (Medical): No    Lack of Transportation (Non-Medical): No  Physical Activity: Not on file  Stress: Not on file  Social Connections: Not on file  Intimate Partner Violence: Not on file    Family History  Problem Relation Age of Onset   Hypertension Mother    Anesthesia problems Neg Hx    Hypotension Neg Hx    Malignant hyperthermia Neg Hx    Pseudochol deficiency Neg Hx     Current Outpatient Medications  Medication Sig Dispense Refill   ACCU-CHEK GUIDE test strip daily before breakfast.     Accu-Chek Softclix Lancets lancets daily before breakfast.     allopurinol (ZYLOPRIM) 300 MG tablet Take 300 mg by mouth daily.     atorvastatin (LIPITOR) 20 MG tablet Take 1 tablet (20 mg total) by mouth daily. 90 tablet 1   blood glucose meter kit and supplies KIT Dispense based on patient and insurance preference. Use up to four times daily as directed. (Patient taking differently: daily before breakfast.) 1 each 0   blood glucose meter kit and supplies by Other route as directed. Check blood sugar every morning     clobetasol cream (TEMOVATE) 1.61 % Apply 1 Application topically 2 (two) times daily. 400 g 0   furosemide (LASIX) 80 MG tablet Take 1 tablet (80 mg total) by mouth daily. 30 tablet  2   gabapentin (NEURONTIN) 300 MG capsule Take 300 mg by mouth 2 (two) times daily.     hydrALAZINE (APRESOLINE) 10 MG tablet Take 1 tablet (10 mg total) by mouth every 8 (eight) hours. 90 tablet 3   Insulin Pen Needle (PENTIPS) 32G X 6 MM MISC      isosorbide dinitrate (ISORDIL) 10 MG tablet Take 1 tablet (10 mg total) by mouth 3 (three) times daily. 45 tablet 0   LANTUS SOLOSTAR 100 UNIT/ML Solostar Pen Inject 10 Units into the skin in the morning.  metolazone (ZAROXOLYN) 5 MG tablet Take 5 mg by mouth daily.     metoprolol tartrate (LOPRESSOR) 25 MG tablet Take 1 tablet (25 mg total) by mouth 2 (two) times daily. 180 tablet 1   mupirocin ointment (BACTROBAN) 2 % Apply 1 Application topically as needed.     NON FORMULARY CPAP  at bedtime     pentoxifylline (TRENTAL) 400 MG CR tablet TAKE 1 TABLET BY MOUTH 3 TIMES  DAILY WITH MEALS 90 tablet 11   potassium chloride SA (KLOR-CON M) 20 MEQ tablet Take 20 mEq by mouth daily.     predniSONE (DELTASONE) 5 MG tablet Take 5 mg by mouth daily with breakfast.     No current facility-administered medications for this visit.    Allergies  Allergen Reactions   Oxycodone Hcl Other (See Comments)    Other reaction(s): "out of it" per pt   Ace Inhibitors Cough    Other reaction(s): cough   Lisinopril Cough   Oxycodone Other (See Comments)    "out of it"      REVIEW OF SYSTEMS:   _0  denotes positive finding, _1  denotes negative finding Cardiac  Comments:  Chest pain or chest pressure:    Shortness of breath upon exertion:    Short of breath when lying flat:    Irregular heart rhythm:        Vascular    Pain in calf, thigh, or hip brought on by ambulation:    Pain in feet at night that wakes you up from your sleep:     Blood clot in your veins:    Leg swelling:         Pulmonary    Oxygen at home:    Productive cough:     Wheezing:         Neurologic    Sudden weakness in arms or legs:     Sudden numbness in arms or legs:      Sudden onset of difficulty speaking or slurred speech:    Temporary loss of vision in one eye:     Problems with dizziness:         Gastrointestinal    Blood in stool:     Vomited blood:         Genitourinary    Burning when urinating:     Blood in urine:        Psychiatric    Major depression:         Hematologic    Bleeding problems:    Problems with blood clotting too easily:        Skin    Rashes or ulcers:        Constitutional    Fever or chills:      PHYSICAL EXAMINATION:  Vitals:   07/09/22 1342  BP: 123/66  Pulse: 86  Resp: 20  Temp: 98.2 F (36.8 C)  SpO2: 95%  Weight: 236 lb (107 kg)  Height: _2  (1.753 m)    General:  WDWN in NAD; vital signs documented above Gait: Not observed HENT: WNL, normocephalic Pulmonary: normal non-labored breathing , without wheezing Cardiac: AFib Abdomen: soft, NT, no masses Skin: without rashes Vascular Exam/Pulses:  Right Left  Radial 2+ (normal) 2+ (normal)  Ulnar 2+ (normal) 2+ (normal)  Femoral    Popliteal    DP 2+ (normal) 2+ (normal)  PT absent 2+ (normal)   Extremities: without ischemic changes, without Gangrene , without cellulitis; with open wounds; 10x8cm 2022  2023    Musculoskeletal: no muscle wasting or atrophy  Neurologic: A&O X 3;  No focal weakness or paresthesias are detected Psychiatric:  The pt has Normal affect.   Non-Invasive Vascular Imaging:     +-------+-----------+-----------+------------+------------+  ABI/TBIToday's ABIToday's TBIPrevious ABIPrevious TBI  +-------+-----------+-----------+------------+------------+  Right  0.87       0.47                                 +-------+-----------+-----------+------------+------------+  Left   0.97       0.60                                 +-------+-----------+-----------+------------+------------+     ASSESSMENT/PLAN: KIMBLE HITCHENS is a 81 y.o. male presenting with traumatic wound to the right leg  which has been slow to heal.  Nevertheless, the wound is healing, and appears to be both clean and superficial.  I called Dr. Sharol Given regarding his care.  ABIs demonstrate mild to moderate peripheral arterial disease bilaterally.  Being that the wound continues to heal, and this very superficial at this point, I do not think diagnostic angiogram would provide significant benefit, especially in the setting of his kidney disease.  Being that this has taken nearly a year to heal however, I asked him to call should healing stagnate, or if the wound worsens.  The location of the wound is most consistent with wound seen in individuals who have chronic venous insufficiency.  The traumatic mechanism, as well as the size of the wound has likely led to some amount of venous disease. His heart failure is also hurting stagnating hte healing process.  My plan is to see him back in the office in 2 months.  Should wound healing stagnate, we will discuss diagnostic angiography with possible intervention, however his kidney function and dialysis will be a major topic of concern.  Will also discuss right lower extremity venous duplex ultrasound to assess for chronic venous insufficiency.   Recommend the following which can slow the progression of atherosclerosis and reduce the risk of major adverse cardiac / limb events:  Aspirin 21m PO QD.  Atorvastatin 40-80mg PO QD (or other "high intensity" statin therapy). Complete cessation from all tobacco products. Blood glucose control with goal A1c < 7%. Blood pressure control with goal blood pressure < 140/90 mmHg. Lipid reduction therapy with goal LDL-C <100 mg/dL (<70 if symptomatic from PAD).     JBroadus John MD [facility] [phone]

## 2022-07-12 ENCOUNTER — Encounter (HOSPITAL_COMMUNITY): Payer: Medicare Other

## 2022-07-13 DIAGNOSIS — M170 Bilateral primary osteoarthritis of knee: Secondary | ICD-10-CM | POA: Diagnosis not present

## 2022-07-16 ENCOUNTER — Encounter (HOSPITAL_COMMUNITY): Payer: Medicare Other

## 2022-07-16 ENCOUNTER — Ambulatory Visit: Payer: Medicare Other | Admitting: Vascular Surgery

## 2022-07-29 DIAGNOSIS — L0390 Cellulitis, unspecified: Secondary | ICD-10-CM | POA: Diagnosis not present

## 2022-07-29 DIAGNOSIS — R238 Other skin changes: Secondary | ICD-10-CM | POA: Diagnosis not present

## 2022-08-04 DIAGNOSIS — I5040 Unspecified combined systolic (congestive) and diastolic (congestive) heart failure: Secondary | ICD-10-CM | POA: Diagnosis not present

## 2022-08-04 DIAGNOSIS — I129 Hypertensive chronic kidney disease with stage 1 through stage 4 chronic kidney disease, or unspecified chronic kidney disease: Secondary | ICD-10-CM | POA: Diagnosis not present

## 2022-08-04 DIAGNOSIS — E1122 Type 2 diabetes mellitus with diabetic chronic kidney disease: Secondary | ICD-10-CM | POA: Diagnosis not present

## 2022-08-04 DIAGNOSIS — M10372 Gout due to renal impairment, left ankle and foot: Secondary | ICD-10-CM | POA: Diagnosis not present

## 2022-08-04 DIAGNOSIS — I10 Essential (primary) hypertension: Secondary | ICD-10-CM | POA: Diagnosis not present

## 2022-08-04 DIAGNOSIS — N184 Chronic kidney disease, stage 4 (severe): Secondary | ICD-10-CM | POA: Diagnosis not present

## 2022-08-05 ENCOUNTER — Other Ambulatory Visit: Payer: Self-pay | Admitting: Orthopedic Surgery

## 2022-08-05 ENCOUNTER — Telehealth: Payer: Self-pay | Admitting: Orthopedic Surgery

## 2022-08-05 ENCOUNTER — Ambulatory Visit (INDEPENDENT_AMBULATORY_CARE_PROVIDER_SITE_OTHER): Payer: Medicare Other | Admitting: Orthopedic Surgery

## 2022-08-05 ENCOUNTER — Other Ambulatory Visit: Payer: Self-pay | Admitting: Cardiovascular Disease

## 2022-08-05 ENCOUNTER — Encounter: Payer: Self-pay | Admitting: Orthopedic Surgery

## 2022-08-05 DIAGNOSIS — I739 Peripheral vascular disease, unspecified: Secondary | ICD-10-CM | POA: Diagnosis not present

## 2022-08-05 DIAGNOSIS — S81801S Unspecified open wound, right lower leg, sequela: Secondary | ICD-10-CM

## 2022-08-05 MED ORDER — CLOBETASOL PROPIONATE 0.05 % EX CREA: 1.0000 | TOPICAL_CREAM | Freq: Two times a day (BID) | CUTANEOUS | 1 refills | Status: DC

## 2022-08-05 NOTE — Progress Notes (Signed)
Office Visit Note   Patient: Robert Dickerson           Date of Birth: 1940-08-09           MRN: [id number] Visit Date: 08/05/2022              Requested by: Kathalene Frames, MD [address] [place],  [place] [address] PCP: Kathalene Frames, MD  Chief Complaint  Patient presents with   Right Leg - Follow-up    Hx 07/03/2021 RLE debridement and graft       HPI: Patient is an 82 year old gentleman is seen in follow-up for venous insufficiency ulceration right lower extremity.  He states he normally wears compression socks and not wearing them today.  Assessment & Plan: Visit Diagnoses:  1. PVD (peripheral vascular disease) (HCC)   2. Leg wound, right, sequela     Plan: Continue with compression Dial soap cleansing.  A refill prescription was provided for his clobetasol.  Follow-Up Instructions: Return in about 4 weeks (around 09/02/2022).   Ortho Exam  Patient is alert, oriented, no adenopathy, well-dressed, normal affect, normal respiratory effort. Examination patient has dermatitis in the right leg there is no cellulitis odor or drainage.  The wound is currently 25 x 35 mm 1 mm deep there is healthy granulation tissue.  Patient states he has 3 days of antibiotics left.  No cellulitis.  Imaging: No results found. No images are attached to the encounter.  Labs: Lab Results  Component Value Date   HGBA1C 8.2 (H) 08/25/2021   HGBA1C 7.6 (H) 05/19/2021   HGBA1C 9.5 (H) 12/23/2020   LABURIC 6.4 03/04/2011   REPTSTATUS 07/06/2021 FINAL 07/01/2021   GRAMSTAIN NO WBC SEEN NO ORGANISMS SEEN  07/01/2021   CULT  07/01/2021    FEW STAPHYLOCOCCUS AUREUS RARE PROTEUS MIRABILIS NO ANAEROBES ISOLATED Performed at [facility], [place] [address] [place], [place] [address]    [place] 07/01/2021   LABORGA PROTEUS MIRABILIS 07/01/2021     Lab Results  Component Value Date   ALBUMIN 2.4 (L) 08/27/2021   ALBUMIN 2.5 (L)  08/25/2021   ALBUMIN 2.6 (L) 06/05/2021    Lab Results  Component Value Date   MG 2.2 08/25/2021   MG 2.2 05/19/2021   MG 2.0 12/23/2020   No results found for: "VD25OH"  No results found for: "PREALBUMIN"    Latest Ref Rng & Units 08/28/2021    2:58 AM 08/27/2021    4:26 AM 08/26/2021    6:15 AM  CBC EXTENDED  WBC 4.0 - 10.5 K/uL 7.6  10.2  14.4   RBC 4.22 - 5.81 MIL/uL 3.97  3.98  4.12   Hemoglobin 13.0 - 17.0 g/dL 11.2  11.2  11.7   HCT 39.0 - 52.0 % 33.9  34.4  36.2   Platelets 150 - 400 K/uL 313  329  300      There is no height or weight on file to calculate BMI.  Orders:  No orders of the defined types were placed in this encounter.  Meds ordered this encounter  Medications   clobetasol cream (TEMOVATE) 0.05 %    Sig: Apply 1 Application topically 2 (two) times daily.    Dispense:  45 g    Refill:  1     Procedures: No procedures performed  Clinical Data: No additional findings.  ROS:  All other systems negative, except as noted in the HPI. Review of Systems  Objective: Vital Signs: There were no vitals taken for this visit.  Specialty Comments:  No specialty comments available.  PMFS History: Patient Active Problem List   Diagnosis Date Noted   Aortic atherosclerosis ([place]) 09/04/2021   Hypercholesterolemia 09/04/2021   Unspecified atrial fibrillation ([place]) 08/25/2021   Acute on chronic diastolic CHF (congestive heart failure) ([place]) 08/25/2021   Wound of right leg, sequela 07/01/2021   Nail, injury by 06/10/2021   Laceration of leg 05/20/2021   Acute respiratory failure with hypoxia ([place]) 05/18/2021   Chronic combined systolic and diastolic heart failure ([place]) 05/18/2021   Chronic kidney disease (CKD), stage IV (severe) ([place]) 05/18/2021   Hypertension associated with diabetes ([place]) 05/18/2021   Insulin dependent type 2 diabetes mellitus ([place]) 05/18/2021   OSA on CPAP 05/18/2021   Laceration of right lower extremity 05/18/2021   Current  chronic use of systemic steroids 01/08/2021   Preoperative cardiovascular examination 01/08/2021   Cellulitis 12/23/2020   Benign neoplasm of duodenum, jejunum, 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   Chronic kidney disease, stage 4 (severe) ([place]) 12/03/2020   Degeneration of lumbar intervertebral disc 12/03/2020   Type 2 diabetes mellitus with stage 4 chronic kidney disease ([place]) 12/03/2020   Diabetic peripheral neuropathy associated with type 2 diabetes mellitus ([place]) 76/54/6503   Diastolic heart failure ([place]) 12/03/2020   Enlarged prostate 12/03/2020   Erectile dysfunction due to arterial insufficiency 12/03/2020   Essential hypertension 12/03/2020   Gastro-esophageal reflux disease without esophagitis 12/03/2020   Hematuria 12/03/2020   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   Morbid obesity ([place]) 12/03/2020   Osteoarthritis of hip 12/03/2020   Osteoarthritis of knee 12/03/2020   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   Restless legs 12/03/2020   Skin sensation disturbance 12/03/2020   Lumbar stenosis 11/21/2020   Tendinitis 09/03/2020   Subacute arthropathy 09/03/2020   Bilateral hearing loss 08/11/2020   Impacted cerumen of left ear 03/10/2017   Excessive cerumen in left ear canal 03/10/2017   Faintness 12/09/2015   Backache 11/14/2014   CHRONIC PANCREATITIS 06/16/2009   Past Medical History:  Diagnosis Date   Arthritis    all joints   Chronic diastolic CHF (congestive heart failure) (HCC)    Chronic kidney disease (CKD), stage IV (severe) (HCC)    Chronic kidney disease, stage 3 (HCC)    Congestive heart failure with LV diastolic dysfunction, NYHA class 2 ([place])    Family history of adverse reaction to anesthesia    father- change in personaility   GERD (gastroesophageal reflux disease)    Hypertension    Hypertension associated with diabetes ([place])    Hypertensive chronic kidney disease    Insulin dependent type 2 diabetes  mellitus ([place])    Neuropathy    OSA on CPAP    Sleep apnea    uses cpap, pt does not know settings    Family History  Problem Relation Age of Onset   Hypertension Mother    Anesthesia problems Neg Hx    Hypotension Neg Hx    Malignant hyperthermia Neg Hx    Pseudochol deficiency Neg Hx     Past Surgical History:  Procedure Laterality Date   ANTERIOR LAT LUMBAR FUSION Left 01/22/2021   Procedure: Lumbar one-Lumbar two Lateral Lumbar Interbody Fusion;  Surgeon: Vallarie Mare, MD;  Location: [facility];  Service: Neurosurgery;  Laterality: Left;   ANTERIOR LAT LUMBAR FUSION  01/22/2021   APPENDECTOMY     BACK SURGERY  4 surg, lower   CHOLECYSTECTOMY     COLONOSCOPY WITH PROPOFOL N/A 06/11/2014   Procedure: COLONOSCOPY WITH PROPOFOL;  Surgeon: Garlan Fair, MD;  [facility];  Service: Endoscopy;  Laterality: N/A;   ESOPHAGOGASTRODUODENOSCOPY  08/26/2011   Procedure: ESOPHAGOGASTRODUODENOSCOPY (EGD);  Surgeon: Garlan Fair, MD;  [facility];  Service: Endoscopy;  Laterality: N/A;   ESOPHAGOGASTRODUODENOSCOPY (EGD) WITH PROPOFOL N/A 06/11/2014   Procedure: ESOPHAGOGASTRODUODENOSCOPY (EGD) WITH PROPOFOL;  Surgeon: Garlan Fair, MD;  [facility];  Service: Endoscopy;  Laterality: N/A;   EUS  09/15/2011   Procedure: UPPER ENDOSCOPIC ULTRASOUND (EUS) LINEAR;  Surgeon: Landry Dyke, MD;  [facility];  Service: Endoscopy;  Laterality: N/A;  MAC   I & D EXTREMITY Right 05/20/2021   Procedure: IRRIGATION AND DEBRIDEMENT OF LEG AND APPLICATION OF SKIN GRAFT;  Surgeon: Newt Minion, MD;  Location: [facility];  Service: Orthopedics;  Laterality: Right;   I & D EXTREMITY Right 07/03/2021   Procedure: REPEAT RIGHT LEG DEBRIDEMENT;  Surgeon: Newt Minion, MD;  Location: [facility];  Service: Orthopedics;  Laterality: Right;   I & D EXTREMITY Right 07/01/2021   Procedure: RIGHT LEG DEBRIDEMENT;  Surgeon: Newt Minion, MD;  Location: [facility];   Service: Orthopedics;  Laterality: Right;   KNEE ARTHROSCOPY Right YEARS AGO   Social History   Occupational History   Occupation: retired  Tobacco Use   Smoking status: Former    Packs/day: 3.00    Years: 20.00    Total pack years: 60.00    Types: Cigarettes    Quit date: 1984    Years since quitting: 40.0   Smokeless tobacco: Never  Vaping Use   Vaping Use: Never used  Substance and Sexual Activity   Alcohol use: Not Currently   Drug use: Never   Sexual activity: Not on file

## 2022-08-05 NOTE — Telephone Encounter (Signed)
Pt called stating that he asked Dr Sharol Given to send his clobetasol cream to [facility] in [place] [facility] not [facility]. Please sen script of cream to [facility]. Pt phone number is [phone]. Please send as soon as possible

## 2022-08-13 ENCOUNTER — Ambulatory Visit
Admission: RE | Admit: 2022-08-13 | Discharge: 2022-08-13 | Disposition: A | Discharge status: Home / Self Care | Payer: Medicare Other | Source: Ambulatory Visit | Attending: Neurosurgery | Admitting: Neurosurgery

## 2022-08-13 DIAGNOSIS — M4126 Other idiopathic scoliosis, lumbar region: Secondary | ICD-10-CM | POA: Diagnosis not present

## 2022-08-13 DIAGNOSIS — M545 Low back pain, unspecified: Secondary | ICD-10-CM | POA: Diagnosis not present

## 2022-08-13 DIAGNOSIS — M8588 Other specified disorders of bone density and structure, other site: Secondary | ICD-10-CM | POA: Diagnosis not present

## 2022-08-13 DIAGNOSIS — Z981 Arthrodesis status: Secondary | ICD-10-CM

## 2022-08-16 DIAGNOSIS — M6281 Muscle weakness (generalized): Secondary | ICD-10-CM | POA: Diagnosis not present

## 2022-08-16 DIAGNOSIS — M5451 Vertebrogenic low back pain: Secondary | ICD-10-CM | POA: Diagnosis not present

## 2022-08-16 DIAGNOSIS — M5136 Other intervertebral disc degeneration, lumbar region: Secondary | ICD-10-CM | POA: Diagnosis not present

## 2022-08-18 DIAGNOSIS — M5451 Vertebrogenic low back pain: Secondary | ICD-10-CM | POA: Diagnosis not present

## 2022-08-18 DIAGNOSIS — M6281 Muscle weakness (generalized): Secondary | ICD-10-CM | POA: Diagnosis not present

## 2022-08-18 DIAGNOSIS — M5136 Other intervertebral disc degeneration, lumbar region: Secondary | ICD-10-CM | POA: Diagnosis not present

## 2022-08-20 DIAGNOSIS — M1A00X0 Idiopathic chronic gout, unspecified site, without tophus (tophi): Secondary | ICD-10-CM | POA: Diagnosis not present

## 2022-08-20 DIAGNOSIS — L299 Pruritus, unspecified: Secondary | ICD-10-CM | POA: Diagnosis not present

## 2022-08-20 DIAGNOSIS — Z6834 Body mass index (BMI) 34.0-34.9, adult: Secondary | ICD-10-CM | POA: Diagnosis not present

## 2022-08-20 DIAGNOSIS — G2581 Restless legs syndrome: Secondary | ICD-10-CM | POA: Diagnosis not present

## 2022-08-20 DIAGNOSIS — E1140 Type 2 diabetes mellitus with diabetic neuropathy, unspecified: Secondary | ICD-10-CM | POA: Diagnosis not present

## 2022-08-20 DIAGNOSIS — M353 Polymyalgia rheumatica: Secondary | ICD-10-CM | POA: Diagnosis not present

## 2022-08-20 DIAGNOSIS — N184 Chronic kidney disease, stage 4 (severe): Secondary | ICD-10-CM | POA: Diagnosis not present

## 2022-08-20 DIAGNOSIS — Z0000 Encounter for general adult medical examination without abnormal findings: Secondary | ICD-10-CM | POA: Diagnosis not present

## 2022-08-20 DIAGNOSIS — I5030 Unspecified diastolic (congestive) heart failure: Secondary | ICD-10-CM | POA: Diagnosis not present

## 2022-08-20 DIAGNOSIS — G4733 Obstructive sleep apnea (adult) (pediatric): Secondary | ICD-10-CM | POA: Diagnosis not present

## 2022-08-20 DIAGNOSIS — Z794 Long term (current) use of insulin: Secondary | ICD-10-CM | POA: Diagnosis not present

## 2022-08-20 DIAGNOSIS — E6609 Other obesity due to excess calories: Secondary | ICD-10-CM | POA: Diagnosis not present

## 2022-08-20 DIAGNOSIS — I129 Hypertensive chronic kidney disease with stage 1 through stage 4 chronic kidney disease, or unspecified chronic kidney disease: Secondary | ICD-10-CM | POA: Diagnosis not present

## 2022-08-20 DIAGNOSIS — Z1331 Encounter for screening for depression: Secondary | ICD-10-CM | POA: Diagnosis not present

## 2022-09-02 ENCOUNTER — Ambulatory Visit: Payer: Medicare Other | Admitting: Orthopedic Surgery

## 2022-09-02 DIAGNOSIS — E119 Type 2 diabetes mellitus without complications: Secondary | ICD-10-CM | POA: Diagnosis not present

## 2022-09-02 DIAGNOSIS — H26493 Other secondary cataract, bilateral: Secondary | ICD-10-CM | POA: Diagnosis not present

## 2022-09-06 ENCOUNTER — Ambulatory Visit (INDEPENDENT_AMBULATORY_CARE_PROVIDER_SITE_OTHER): Payer: Medicare Other | Admitting: Orthopedic Surgery

## 2022-09-06 ENCOUNTER — Encounter: Payer: Self-pay | Admitting: Orthopedic Surgery

## 2022-09-06 DIAGNOSIS — Z945 Skin transplant status: Secondary | ICD-10-CM

## 2022-09-06 DIAGNOSIS — I739 Peripheral vascular disease, unspecified: Secondary | ICD-10-CM | POA: Diagnosis not present

## 2022-09-06 DIAGNOSIS — S81801S Unspecified open wound, right lower leg, sequela: Secondary | ICD-10-CM | POA: Diagnosis not present

## 2022-09-06 MED ORDER — CLOBETASOL PROPIONATE 0.05 % EX CREA: 1.0000 | TOPICAL_CREAM | Freq: Two times a day (BID) | CUTANEOUS | 8 refills | Status: DC

## 2022-09-06 NOTE — Progress Notes (Signed)
Office Visit Note   Patient: Robert Dickerson           Date of Birth: 06/08/1941           MRN: [id number] Visit Date: 09/06/2022              Requested by: Kathalene Frames, MD 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  Patient most recently had debridement and tissue graft 1 year ago.  Patient states he has been using compression and Dial soap cleansing.  Assessment & Plan: Visit Diagnoses:  1. PVD (peripheral vascular disease) (HCC)   2. Leg wound, right, sequela   3. History of skin graft     Plan: Patient wants to continue his clobetasol.  A prescription was called in continue with routine wound care.  Follow-Up Instructions: Return in about 4 weeks (around 10/04/2022).   Ortho Exam  Patient is alert, oriented, no adenopathy, well-dressed, normal affect, normal respiratory effort. Examination patient has had good epithelization of the majority of the ulcer medial aspect of the right calf.  The ulcer currently measures 25 mm in diameter 1 mm deep with good granulation tissue around the wound edges.  There is no cellulitis or drainage.  Imaging: No results found.   Labs: Lab Results  Component Value Date   HGBA1C 8.2 (H) 08/25/2021   HGBA1C 7.6 (H) 05/19/2021   HGBA1C 9.5 (H) 12/23/2020   LABURIC 6.4 03/04/2011   REPTSTATUS 07/06/2021 FINAL 07/01/2021   GRAMSTAIN NO WBC SEEN NO ORGANISMS SEEN  07/01/2021   CULT  07/01/2021    FEW STAPHYLOCOCCUS AUREUS RARE PROTEUS MIRABILIS NO ANAEROBES ISOLATED Performed at [facility], [place] [address], [place], [place] [address]    [place] 07/01/2021   LABORGA PROTEUS MIRABILIS 07/01/2021     Lab Results   Component Value Date   ALBUMIN 2.4 (L) 08/27/2021   ALBUMIN 2.5 (L) 08/25/2021   ALBUMIN 2.6 (L) 06/05/2021    Lab Results  Component Value Date   MG 2.2 08/25/2021   MG 2.2 05/19/2021   MG 2.0 12/23/2020   No results found for: "VD25OH"  No results found for: "PREALBUMIN"    Latest Ref Rng & Units 08/28/2021    2:58 AM 08/27/2021    4:26 AM 08/26/2021    6:15 AM  CBC EXTENDED  WBC 4.0 - 10.5 K/uL 7.6  10.2  14.4   RBC 4.22 - 5.81 MIL/uL 3.97  3.98  4.12   Hemoglobin 13.0 - 17.0 g/dL 11.2  11.2  11.7   HCT 39.0 - 52.0 % 33.9  34.4  36.2   Platelets 150 - 400 K/uL 313  329  300      There is no height or weight on file to calculate BMI.  Orders:  No orders of the defined types were placed in this encounter.  Meds ordered this encounter  Medications   clobetasol cream (TEMOVATE) 0.05 %    Sig: Apply 1 Application topically 2 (two) times daily. 60 g tube x 8 for a 11-monthsupply.    Dispense:  60 g    Refill:  8     Procedures: No procedures performed  Clinical Data: No additional findings.  ROS:  All other systems negative, except as noted in the HPI. Review of Systems  Objective: Vital Signs: There were no vitals taken for this visit.  Specialty Comments:  No specialty comments available.  PMFS History: Patient Active Problem List   Diagnosis Date Noted   Aortic atherosclerosis ([place]) 09/04/2021   Hypercholesterolemia 09/04/2021   Unspecified atrial fibrillation ([place]) 08/25/2021   Acute on chronic diastolic CHF (congestive heart failure) ([place]) 08/25/2021   Wound of right leg, sequela 07/01/2021   Nail, injury by 06/10/2021   Laceration of leg 05/20/2021   Acute respiratory failure with hypoxia ([place]) 05/18/2021   Chronic combined systolic and diastolic heart failure ([place]) 05/18/2021   Chronic kidney disease (CKD), stage IV (severe) ([place]) 05/18/2021   Hypertension associated with diabetes ([place]) 05/18/2021   Insulin dependent type 2 diabetes mellitus  ([place]) 05/18/2021   OSA on CPAP 05/18/2021   Laceration of right lower extremity 05/18/2021   Current chronic use of systemic steroids 01/08/2021   Preoperative cardiovascular examination 01/08/2021   Cellulitis 12/23/2020   Benign neoplasm of duodenum, jejunum, 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   Chronic kidney disease, stage 4 (severe) ([place]) 12/03/2020   Degeneration of lumbar intervertebral disc 12/03/2020   Type 2 diabetes mellitus with stage 4 chronic kidney disease ([place]) 12/03/2020   Diabetic peripheral neuropathy associated with type 2 diabetes mellitus ([place]) 09/38/1829   Diastolic heart failure ([place]) 12/03/2020   Enlarged prostate 12/03/2020   Erectile dysfunction due to arterial insufficiency 12/03/2020   Essential hypertension 12/03/2020   Gastro-esophageal reflux disease without esophagitis 12/03/2020   Hematuria 12/03/2020   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   Morbid obesity ([place]) 12/03/2020   Osteoarthritis of hip 12/03/2020   Osteoarthritis of knee 12/03/2020   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   Restless legs 12/03/2020   Skin sensation disturbance 12/03/2020   Lumbar stenosis 11/21/2020   Tendinitis 09/03/2020   Subacute arthropathy 09/03/2020   Bilateral hearing loss 08/11/2020   Impacted cerumen of left ear 03/10/2017   Excessive cerumen in left ear canal 03/10/2017   Faintness 12/09/2015   Backache 11/14/2014   CHRONIC PANCREATITIS 06/16/2009   Past Medical History:  Diagnosis Date   Arthritis    all joints   Chronic diastolic CHF (congestive heart failure) (HCC)    Chronic kidney disease (CKD), stage IV (severe) (HCC)    Chronic kidney disease, stage 3 (HCC)    Congestive heart failure with LV diastolic dysfunction, NYHA class 2 ([place])    Family history of adverse reaction to anesthesia    father- change in personaility   GERD (gastroesophageal reflux disease)    Hypertension    Hypertension  associated with diabetes ([place])    Hypertensive chronic kidney disease    Insulin dependent type 2 diabetes mellitus ([place])    Neuropathy    OSA on CPAP    Sleep apnea    uses cpap, pt does not know settings    Family History  Problem Relation Age of Onset   Hypertension Mother    Anesthesia problems Neg Hx    Hypotension Neg Hx    Malignant hyperthermia Neg Hx    Pseudochol deficiency Neg Hx     Past Surgical History:  Procedure Laterality Date   ANTERIOR LAT LUMBAR FUSION Left 01/22/2021   Procedure: Lumbar one-Lumbar two Lateral Lumbar Interbody Fusion;  Surgeon: Vallarie Mare, MD;  Location: [facility];  Service: Neurosurgery;  Laterality:  Left;   ANTERIOR LAT LUMBAR FUSION  01/22/2021   APPENDECTOMY     BACK SURGERY     4 surg, lower   CHOLECYSTECTOMY     COLONOSCOPY WITH PROPOFOL N/A 06/11/2014   Procedure: COLONOSCOPY WITH PROPOFOL;  Surgeon: Garlan Fair, MD;  [facility];  Service: Endoscopy;  Laterality: N/A;   ESOPHAGOGASTRODUODENOSCOPY  08/26/2011   Procedure: ESOPHAGOGASTRODUODENOSCOPY (EGD);  Surgeon: Garlan Fair, MD;  [facility];  Service: Endoscopy;  Laterality: N/A;   ESOPHAGOGASTRODUODENOSCOPY (EGD) WITH PROPOFOL N/A 06/11/2014   Procedure: ESOPHAGOGASTRODUODENOSCOPY (EGD) WITH PROPOFOL;  Surgeon: Garlan Fair, MD;  [facility];  Service: Endoscopy;  Laterality: N/A;   EUS  09/15/2011   Procedure: UPPER ENDOSCOPIC ULTRASOUND (EUS) LINEAR;  Surgeon: Landry Dyke, MD;  [facility];  Service: Endoscopy;  Laterality: N/A;  MAC   I & D EXTREMITY Right 05/20/2021   Procedure: IRRIGATION AND DEBRIDEMENT OF LEG AND APPLICATION OF SKIN GRAFT;  Surgeon: Newt Minion, MD;  Location: [facility];  Service: Orthopedics;  Laterality: Right;   I & D EXTREMITY Right 07/03/2021   Procedure: REPEAT RIGHT LEG DEBRIDEMENT;  Surgeon: Newt Minion, MD;  Location: [facility];  Service: Orthopedics;  Laterality: Right;   I & D EXTREMITY  Right 07/01/2021   Procedure: RIGHT LEG DEBRIDEMENT;  Surgeon: Newt Minion, MD;  Location: [facility];  Service: Orthopedics;  Laterality: Right;   KNEE ARTHROSCOPY Right YEARS AGO   Social History   Occupational History   Occupation: retired  Tobacco Use   Smoking status: Former    Packs/day: 3.00    Years: 20.00    Total pack years: 60.00    Types: Cigarettes    Quit date: 1984    Years since quitting: 40.1   Smokeless tobacco: Never  Vaping Use   Vaping Use: Never used  Substance and Sexual Activity   Alcohol use: Not Currently   Drug use: Never   Sexual activity: Not on file

## 2022-09-10 DIAGNOSIS — Z981 Arthrodesis status: Secondary | ICD-10-CM | POA: Diagnosis not present

## 2022-09-16 DIAGNOSIS — E1122 Type 2 diabetes mellitus with diabetic chronic kidney disease: Secondary | ICD-10-CM | POA: Diagnosis not present

## 2022-09-16 DIAGNOSIS — N184 Chronic kidney disease, stage 4 (severe): Secondary | ICD-10-CM | POA: Diagnosis not present

## 2022-09-16 DIAGNOSIS — E0859 Diabetes mellitus due to underlying condition with other circulatory complications: Secondary | ICD-10-CM | POA: Diagnosis not present

## 2022-09-16 DIAGNOSIS — I10 Essential (primary) hypertension: Secondary | ICD-10-CM | POA: Diagnosis not present

## 2022-09-16 DIAGNOSIS — I5040 Unspecified combined systolic (congestive) and diastolic (congestive) heart failure: Secondary | ICD-10-CM | POA: Diagnosis not present

## 2022-09-16 DIAGNOSIS — M10372 Gout due to renal impairment, left ankle and foot: Secondary | ICD-10-CM | POA: Diagnosis not present

## 2022-10-01 ENCOUNTER — Ambulatory Visit (INDEPENDENT_AMBULATORY_CARE_PROVIDER_SITE_OTHER): Payer: Medicare Other | Admitting: Podiatry

## 2022-10-01 ENCOUNTER — Encounter: Payer: Self-pay | Admitting: Podiatry

## 2022-10-01 DIAGNOSIS — N189 Chronic kidney disease, unspecified: Secondary | ICD-10-CM | POA: Diagnosis not present

## 2022-10-01 DIAGNOSIS — B351 Tinea unguium: Secondary | ICD-10-CM | POA: Diagnosis not present

## 2022-10-01 DIAGNOSIS — E1142 Type 2 diabetes mellitus with diabetic polyneuropathy: Secondary | ICD-10-CM

## 2022-10-01 DIAGNOSIS — M79674 Pain in right toe(s): Secondary | ICD-10-CM | POA: Diagnosis not present

## 2022-10-01 DIAGNOSIS — M79675 Pain in left toe(s): Secondary | ICD-10-CM | POA: Diagnosis not present

## 2022-10-01 DIAGNOSIS — N184 Chronic kidney disease, stage 4 (severe): Secondary | ICD-10-CM | POA: Diagnosis not present

## 2022-10-01 NOTE — Progress Notes (Signed)
This patient returns to the office for evaluation and treatment of long thick painful nails .  This patient is unable to trim his own nails since the patient cannot reach his feet.  Patient says the nails are painful walking and wearing his shoes. Patient has history of back surgery. Patient was in [place] in October 2022.    He returns for preventive foot care services.   General Appearance  Alert, conversant and in no acute stress.  Vascular  Dorsalis pedis and posterior tibial  pulses are palpable  bilaterally.  Capillary return is within normal limits  bilaterally. Temperature is within normal limits  bilaterally.  Neurologic  Senn-Weinstein monofilament wire test diminished  bilaterally. Muscle power within normal limits bilaterally.  Nails Thick disfigured discolored nails with subungual debris  from hallux to second  toes bilaterally. No evidence of bacterial infection or drainage bilaterally.Lateral distal aspect of the second toenail is absent.  Orthopedic  No limitations of motion  feet .  No crepitus or effusions noted.  No bony pathology or digital deformities noted. Prominent metatarsal heads  B/L.  Skin  Dry scaly  skin with no porokeratosis noted bilaterally.  No signs of infections or ulcers noted.     Onychomycosis  Pain in toes right foot  Pain in toes left foot    Debridement  of nails  1-5  B/L with a nail nipper.  Nails were then filed using a dremel tool with no incidents.   RTC 3 months    Gardiner Barefoot DPM

## 2022-10-04 DIAGNOSIS — R809 Proteinuria, unspecified: Secondary | ICD-10-CM | POA: Diagnosis not present

## 2022-10-04 DIAGNOSIS — L299 Pruritus, unspecified: Secondary | ICD-10-CM | POA: Diagnosis not present

## 2022-10-04 DIAGNOSIS — I5020 Unspecified systolic (congestive) heart failure: Secondary | ICD-10-CM | POA: Diagnosis not present

## 2022-10-04 DIAGNOSIS — L97919 Non-pressure chronic ulcer of unspecified part of right lower leg with unspecified severity: Secondary | ICD-10-CM | POA: Diagnosis not present

## 2022-10-04 DIAGNOSIS — N184 Chronic kidney disease, stage 4 (severe): Secondary | ICD-10-CM | POA: Diagnosis not present

## 2022-10-07 ENCOUNTER — Encounter: Payer: Self-pay | Admitting: Orthopedic Surgery

## 2022-10-07 ENCOUNTER — Ambulatory Visit (INDEPENDENT_AMBULATORY_CARE_PROVIDER_SITE_OTHER): Payer: Medicare Other | Admitting: Orthopedic Surgery

## 2022-10-07 DIAGNOSIS — I739 Peripheral vascular disease, unspecified: Secondary | ICD-10-CM

## 2022-10-07 DIAGNOSIS — S81811D Laceration without foreign body, right lower leg, subsequent encounter: Secondary | ICD-10-CM

## 2022-10-07 NOTE — Progress Notes (Signed)
Office Visit Note   Patient: Robert Dickerson           Date of Birth: 03/28/1941           MRN: [id number] Visit Date: 10/07/2022              Requested by: Kathalene Frames, MD 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  He is status post debridement and grafting in December.  Patient has had good interval healing but the ulcer has stalled in its healing.  Assessment & Plan: Visit Diagnoses:  1. PVD (peripheral vascular disease) ([place])   2. Laceration of right lower leg, subsequent encounter     Plan: Kerecis micro graft 8 cm was applied.  Reevaluate in 1 week.  Follow-Up Instructions: Return in about 1 week (around 10/14/2022).   Ortho Exam  Patient is alert, oriented, no adenopathy, well-dressed, normal affect, normal respiratory effort. Examination patient has good epithelialization however the wound has stalled in healing.  The wound measures 25 x 30 mm with fibrinous tissue.  After informed consent a 10 blade knife was used to debride back to bleeding viable granulation tissue.  The Kerecis micro graft 8 cm was applied a sterile compressive dressing was applied.  The wound healing has stalled, the wound bed has healthy granulation tissue, and patient presents for evaluation and application of [place] Micro graft. After informed consent a 10 blade knife was used to debride the skin and soft tissue to healthy viable bleeding granulation tissue.  Silver nitrate was used for hemostasis. The wound measures: 3.0 cm in length, 2.5cm  in width, 1 mm in depth, wound location anterior medial right calf Kerecis MariGen micro tissue graft 8 cm2 was applied, and there was no  wastage.  Please see the photo below of the Lot number and expiration date. The micro tissue graft was covered with a nonadherent Adaptic dressing, bolstered with 4 x 4 gauze and secured with a compression wrap.     Imaging: No results found.     Labs: Lab Results  Component Value Date   HGBA1C 8.2 (H) 08/25/2021   HGBA1C 7.6 (H) 05/19/2021   HGBA1C 9.5 (H) 12/23/2020   LABURIC 6.4 03/04/2011   REPTSTATUS 07/06/2021 FINAL 07/01/2021   GRAMSTAIN NO WBC SEEN NO ORGANISMS SEEN  07/01/2021   CULT  07/01/2021    FEW STAPHYLOCOCCUS AUREUS RARE PROTEUS MIRABILIS NO ANAEROBES ISOLATED Performed at [facility], [place] [address], [place], [place] [address]    LABORGA STAPHYLOCOCCUS AUREUS 07/01/2021   LABORGA PROTEUS MIRABILIS 07/01/2021     Lab Results  Component Value Date   ALBUMIN 2.4 (L) 08/27/2021   ALBUMIN 2.5 (L) 08/25/2021   ALBUMIN 2.6 (L) 06/05/2021    Lab Results  Component Value Date   MG 2.2 08/25/2021   MG 2.2 05/19/2021   MG 2.0 12/23/2020   No results found for: "VD25OH"  No results found for: "PREALBUMIN"    Latest Ref Rng & Units 08/28/2021    2:58 AM 08/27/2021    4:26 AM 08/26/2021    6:15 AM  CBC EXTENDED  WBC 4.0 - 10.5 K/uL 7.6  10.2  14.4   RBC 4.22 - 5.81 MIL/uL 3.97  3.98  4.12   Hemoglobin 13.0 - 17.0 g/dL 11.2  11.2  11.7   HCT 39.0 - 52.0 % 33.9  34.4  36.2   Platelets 150 - 400 K/uL 313  329  300      There is no height or weight on file to calculate BMI.  Orders:  No orders of the defined types were placed in this encounter.  No orders of the defined types were placed in this encounter.    Procedures: No procedures performed  Clinical Data: No additional findings.  ROS:  All other systems negative, except as noted in the HPI. Review of Systems  Objective: Vital Signs: There were no vitals taken for this visit.  Specialty Comments:  No specialty comments available.  PMFS History: Patient Active Problem  List   Diagnosis Date Noted   Aortic atherosclerosis ([place]) 09/04/2021   Hypercholesterolemia 09/04/2021   Unspecified atrial fibrillation ([place]) 08/25/2021   Acute on chronic diastolic CHF (congestive heart failure) ([place]) 08/25/2021   Wound of right leg, sequela 07/01/2021   Nail, injury by 06/10/2021   Laceration of leg 05/20/2021   Acute respiratory failure with hypoxia ([place]) 05/18/2021   Chronic combined systolic and diastolic heart failure ([place]) 05/18/2021   Chronic kidney disease (CKD), stage IV (severe) ([place]) 05/18/2021   Hypertension associated with diabetes ([place]) 05/18/2021   Insulin dependent type 2 diabetes mellitus ([place]) 05/18/2021   OSA on CPAP 05/18/2021   Laceration of right lower extremity 05/18/2021   Current chronic use of systemic steroids 01/08/2021   Preoperative cardiovascular examination 01/08/2021   Cellulitis 12/23/2020   Benign neoplasm of duodenum, jejunum, 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   Chronic kidney disease, stage 4 (severe) ([place]) 12/03/2020   Degeneration of lumbar intervertebral disc 12/03/2020   Type 2 diabetes mellitus with stage 4 chronic kidney disease ([place]) 12/03/2020   Diabetic peripheral neuropathy associated with type 2 diabetes mellitus ([place]) [id number]   Diastolic heart failure ([place]) 12/03/2020   Enlarged prostate 12/03/2020   Erectile dysfunction due to arterial insufficiency 12/03/2020   Essential hypertension 12/03/2020   Gastro-esophageal reflux disease without esophagitis 12/03/2020   Hematuria 12/03/2020   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   Morbid obesity ([place]) 12/03/2020   Osteoarthritis of hip 12/03/2020   Osteoarthritis of knee 12/03/2020   Polymyalgia rheumatica ([place]) 12/03/2020   Restless legs 12/03/2020   Skin sensation disturbance 12/03/2020   Lumbar stenosis 11/21/2020   Tendinitis 09/03/2020   Subacute arthropathy 09/03/2020   Bilateral hearing loss 08/11/2020    Impacted cerumen of left ear 03/10/2017   Excessive cerumen in left ear canal 03/10/2017   Faintness 12/09/2015   Backache 11/14/2014   CHRONIC PANCREATITIS 06/16/2009   Past Medical History:  Diagnosis Date   Arthritis    all joints   Chronic diastolic CHF (congestive heart failure) (HCC)    Chronic kidney disease (CKD), stage IV (severe) (HCC)    Chronic kidney disease, stage 3 (HCC)    Congestive heart failure with LV diastolic dysfunction, NYHA class 2 ([place])    Family history of adverse reaction to anesthesia    father- change in personaility   GERD (gastroesophageal reflux disease)    Hypertension    Hypertension associated with diabetes ([place])    Hypertensive chronic kidney disease    Insulin dependent type 2 diabetes mellitus ([place])    Neuropathy    OSA on CPAP  Sleep apnea    uses cpap, pt does not know settings    Family History  Problem Relation Age of Onset   Hypertension Mother    Anesthesia problems Neg Hx    Hypotension Neg Hx    Malignant hyperthermia Neg Hx    Pseudochol deficiency Neg Hx     Past Surgical History:  Procedure Laterality Date   ANTERIOR LAT LUMBAR FUSION Left 01/22/2021   Procedure: Lumbar one-Lumbar two Lateral Lumbar Interbody Fusion;  Surgeon: Vallarie Mare, MD;  Location: [facility];  Service: Neurosurgery;  Laterality: Left;   ANTERIOR LAT LUMBAR FUSION  01/22/2021   APPENDECTOMY     BACK SURGERY     4 surg, lower   CHOLECYSTECTOMY     COLONOSCOPY WITH PROPOFOL N/A 06/11/2014   Procedure: COLONOSCOPY WITH PROPOFOL;  Surgeon: Garlan Fair, MD;  [facility];  Service: Endoscopy;  Laterality: N/A;   ESOPHAGOGASTRODUODENOSCOPY  08/26/2011   Procedure: ESOPHAGOGASTRODUODENOSCOPY (EGD);  Surgeon: Garlan Fair, MD;  [facility];  Service: Endoscopy;  Laterality: N/A;   ESOPHAGOGASTRODUODENOSCOPY (EGD) WITH PROPOFOL N/A 06/11/2014   Procedure: ESOPHAGOGASTRODUODENOSCOPY (EGD) WITH PROPOFOL;  Surgeon: Garlan Fair, MD;  [facility];  Service: Endoscopy;  Laterality: N/A;   EUS  09/15/2011   Procedure: UPPER ENDOSCOPIC ULTRASOUND (EUS) LINEAR;  Surgeon: Landry Dyke, MD;  [facility];  Service: Endoscopy;  Laterality: N/A;  MAC   I & D EXTREMITY Right 05/20/2021   Procedure: IRRIGATION AND DEBRIDEMENT OF LEG AND APPLICATION OF SKIN GRAFT;  Surgeon: Newt Minion, MD;  Location: [facility];  Service: Orthopedics;  Laterality: Right;   I & D EXTREMITY Right 07/03/2021   Procedure: REPEAT RIGHT LEG DEBRIDEMENT;  Surgeon: Newt Minion, MD;  Location: [facility];  Service: Orthopedics;  Laterality: Right;   I & D EXTREMITY Right 07/01/2021   Procedure: RIGHT LEG DEBRIDEMENT;  Surgeon: Newt Minion, MD;  Location: [facility];  Service: Orthopedics;  Laterality: Right;   KNEE ARTHROSCOPY Right YEARS AGO   Social History   Occupational History   Occupation: retired  Tobacco Use   Smoking status: Former    Packs/day: 3.00    Years: 20.00    Total pack years: 60.00    Types: Cigarettes    Quit date: 1984    Years since quitting: 40.2   Smokeless tobacco: Never  Vaping Use   Vaping Use: Never used  Substance and Sexual Activity   Alcohol use: Not Currently   Drug use: Never   Sexual activity: Not on file

## 2022-10-08 ENCOUNTER — Ambulatory Visit: Payer: Medicare Other | Admitting: Vascular Surgery

## 2022-10-14 ENCOUNTER — Ambulatory Visit (INDEPENDENT_AMBULATORY_CARE_PROVIDER_SITE_OTHER): Payer: Medicare Other | Admitting: Orthopedic Surgery

## 2022-10-14 ENCOUNTER — Encounter: Payer: Self-pay | Admitting: Orthopedic Surgery

## 2022-10-14 DIAGNOSIS — S81811D Laceration without foreign body, right lower leg, subsequent encounter: Secondary | ICD-10-CM

## 2022-10-14 DIAGNOSIS — I739 Peripheral vascular disease, unspecified: Secondary | ICD-10-CM

## 2022-10-14 NOTE — Progress Notes (Signed)
Office Visit Note   Patient: Robert Dickerson           Date of Birth: 1940-11-26           MRN: [id number] Visit Date: 10/14/2022              Requested by: Kathalene Frames, MD 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  Assessment & Plan: Visit Diagnoses:  1. PVD (peripheral vascular disease) ([place])   2. Laceration of right lower leg, subsequent encounter     Plan: Repeat application of the Kerecis tissue graft was applied.  Follow-Up Instructions: Return in about 1 week (around 10/21/2022).   Ortho Exam  Patient is alert, oriented, no adenopathy, well-dressed, normal affect, normal respiratory effort. Examination patient has brawny skin color changes with pitting edema.  The ulcer was debrided and the wound measures 3 x 3 cm after debridement.  Kerecis tissue graft was applied 8 cm.  The wound healing has stalled, the wound bed has healthy granulation tissue, and patient presents for evaluation and application of [place] Micro graft. After informed consent a 10 blade knife was used to debride the skin and soft tissue to healthy viable bleeding granulation tissue.  Silver nitrate was used for hemostasis. The wound measures: 3 cm in length, 3cm  in width, 1 mm in depth, wound location medial right calf Kerecis MariGen micro tissue graft 8 cm2 was applied, and there was no wastage.  Please see the photo below of the Lot number and expiration date. The micro tissue graft was covered with a nonadherent Adaptic dressing, bolstered with 4 x 4 gauze and secured with a compression wrap.     Imaging: No results found.    Labs: Lab Results  Component  Value Date   HGBA1C 8.2 (H) 08/25/2021   HGBA1C 7.6 (H) 05/19/2021   HGBA1C 9.5 (H) 12/23/2020   LABURIC 6.4 03/04/2011   REPTSTATUS 07/06/2021 FINAL 07/01/2021   GRAMSTAIN NO WBC SEEN NO ORGANISMS SEEN  07/01/2021   CULT  07/01/2021    FEW STAPHYLOCOCCUS AUREUS RARE PROTEUS MIRABILIS NO ANAEROBES ISOLATED Performed at [facility], [place] [address] [place], [place] [address]    LABORGA STAPHYLOCOCCUS AUREUS 07/01/2021   LABORGA PROTEUS MIRABILIS 07/01/2021     Lab Results  Component Value Date   ALBUMIN 2.4 (L) 08/27/2021   ALBUMIN 2.5 (L) 08/25/2021   ALBUMIN 2.6 (L) 06/05/2021    Lab Results  Component Value Date   MG 2.2 08/25/2021   MG 2.2 05/19/2021   MG 2.0 12/23/2020   No results found for: "VD25OH"  No results found for: "PREALBUMIN"    Latest Ref Rng & Units 08/28/2021    2:58 AM 08/27/2021    4:26 AM 08/26/2021    6:15 AM  CBC EXTENDED  WBC 4.0 - 10.5 K/uL 7.6  10.2  14.4   RBC 4.22 - 5.81 MIL/uL 3.97  3.98  4.12   Hemoglobin 13.0 - 17.0 g/dL 11.2  11.2  11.7   HCT 39.0 - 52.0 % 33.9  34.4  36.2   Platelets 150 - 400 K/uL 313  329  300  There is no height or weight on file to calculate BMI.  Orders:  No orders of the defined types were placed in this encounter.  No orders of the defined types were placed in this encounter.    Procedures: No procedures performed  Clinical Data: No additional findings.  ROS:  All other systems negative, except as noted in the HPI. Review of Systems  Objective: Vital Signs: There were no vitals taken for this visit.  Specialty Comments:  No specialty comments available.  PMFS History: Patient Active Problem List   Diagnosis Date Noted   Aortic atherosclerosis ([place]) 09/04/2021   Hypercholesterolemia 09/04/2021   Unspecified atrial fibrillation ([place]) 08/25/2021   Acute on chronic diastolic CHF (congestive heart failure) ([place]) 08/25/2021   Wound of right leg, sequela 07/01/2021   Nail,  injury by 06/10/2021   Laceration of leg 05/20/2021   Acute respiratory failure with hypoxia ([place]) 05/18/2021   Chronic combined systolic and diastolic heart failure ([place]) 05/18/2021   Chronic kidney disease (CKD), stage IV (severe) ([place]) 05/18/2021   Hypertension associated with diabetes ([place]) 05/18/2021   Insulin dependent type 2 diabetes mellitus ([place]) 05/18/2021   OSA on CPAP 05/18/2021   Laceration of right lower extremity 05/18/2021   Current chronic use of systemic steroids 01/08/2021   Preoperative cardiovascular examination 01/08/2021   Cellulitis 12/23/2020   Benign neoplasm of duodenum, jejunum, 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   Chronic kidney disease, stage 4 (severe) ([place]) 12/03/2020   Degeneration of lumbar intervertebral disc 12/03/2020   Type 2 diabetes mellitus with stage 4 chronic kidney disease ([place]) 12/03/2020   Diabetic peripheral neuropathy associated with type 2 diabetes mellitus ([place]) [id number]   Diastolic heart failure ([place]) 12/03/2020   Enlarged prostate 12/03/2020   Erectile dysfunction due to arterial insufficiency 12/03/2020   Essential hypertension 12/03/2020   Gastro-esophageal reflux disease without esophagitis 12/03/2020   Hematuria 12/03/2020   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   Morbid obesity ([place]) 12/03/2020   Osteoarthritis of hip 12/03/2020   Osteoarthritis of knee 12/03/2020   Polymyalgia rheumatica ([place]) 12/03/2020   Restless legs 12/03/2020   Skin sensation disturbance 12/03/2020   Lumbar stenosis 11/21/2020   Tendinitis 09/03/2020   Subacute arthropathy 09/03/2020   Bilateral hearing loss 08/11/2020   Impacted cerumen of left ear 03/10/2017   Excessive cerumen in left ear canal 03/10/2017   Faintness 12/09/2015   Backache 11/14/2014   CHRONIC PANCREATITIS 06/16/2009   Past Medical History:  Diagnosis Date   Arthritis    all joints   Chronic diastolic CHF (congestive heart failure)  (HCC)    Chronic kidney disease (CKD), stage IV (severe) (HCC)    Chronic kidney disease, stage 3 (HCC)    Congestive heart failure with LV diastolic dysfunction, NYHA class 2 ([place])    Family history of adverse reaction to anesthesia    father- change in personaility   GERD (gastroesophageal reflux disease)    Hypertension    Hypertension associated with diabetes ([place])    Hypertensive chronic kidney disease    Insulin dependent type 2 diabetes mellitus ([place])    Neuropathy    OSA on CPAP    Sleep apnea    uses cpap, pt does not know settings    Family History  Problem Relation Age of Onset   Hypertension Mother    Anesthesia problems Neg Hx    Hypotension Neg Hx    Malignant hyperthermia Neg Hx    Pseudochol deficiency Neg  Hx     Past Surgical History:  Procedure Laterality Date   ANTERIOR LAT LUMBAR FUSION Left 01/22/2021   Procedure: Lumbar one-Lumbar two Lateral Lumbar Interbody Fusion;  Surgeon: Vallarie Mare, MD;  Location: [facility];  Service: Neurosurgery;  Laterality: Left;   ANTERIOR LAT LUMBAR FUSION  01/22/2021   APPENDECTOMY     BACK SURGERY     4 surg, lower   CHOLECYSTECTOMY     COLONOSCOPY WITH PROPOFOL N/A 06/11/2014   Procedure: COLONOSCOPY WITH PROPOFOL;  Surgeon: Garlan Fair, MD;  [facility];  Service: Endoscopy;  Laterality: N/A;   ESOPHAGOGASTRODUODENOSCOPY  08/26/2011   Procedure: ESOPHAGOGASTRODUODENOSCOPY (EGD);  Surgeon: Garlan Fair, MD;  [facility];  Service: Endoscopy;  Laterality: N/A;   ESOPHAGOGASTRODUODENOSCOPY (EGD) WITH PROPOFOL N/A 06/11/2014   Procedure: ESOPHAGOGASTRODUODENOSCOPY (EGD) WITH PROPOFOL;  Surgeon: Garlan Fair, MD;  [facility];  Service: Endoscopy;  Laterality: N/A;   EUS  09/15/2011   Procedure: UPPER ENDOSCOPIC ULTRASOUND (EUS) LINEAR;  Surgeon: Landry Dyke, MD;  [facility];  Service: Endoscopy;  Laterality: N/A;  MAC   I & D EXTREMITY Right 05/20/2021    Procedure: IRRIGATION AND DEBRIDEMENT OF LEG AND APPLICATION OF SKIN GRAFT;  Surgeon: Newt Minion, MD;  Location: [facility];  Service: Orthopedics;  Laterality: Right;   I & D EXTREMITY Right 07/03/2021   Procedure: REPEAT RIGHT LEG DEBRIDEMENT;  Surgeon: Newt Minion, MD;  Location: [facility];  Service: Orthopedics;  Laterality: Right;   I & D EXTREMITY Right 07/01/2021   Procedure: RIGHT LEG DEBRIDEMENT;  Surgeon: Newt Minion, MD;  Location: [facility];  Service: Orthopedics;  Laterality: Right;   KNEE ARTHROSCOPY Right YEARS AGO   Social History   Occupational History   Occupation: retired  Tobacco Use   Smoking status: Former    Packs/day: 3.00    Years: 20.00    Additional pack years: 0.00    Total pack years: 60.00    Types: Cigarettes    Quit date: 1984    Years since quitting: 40.2   Smokeless tobacco: Never  Vaping Use   Vaping Use: Never used  Substance and Sexual Activity   Alcohol use: Not Currently   Drug use: Never   Sexual activity: Not on file

## 2022-10-14 NOTE — Progress Notes (Signed)
Office Note    HPI: Robert Dickerson is a 82 y.o. (11/22/1940) male presenting in follow-up at the request of Dr. Meridee Score for slow-healing right leg wound.  He was seen roughly 1 year ago with a large traumatic wound on his right leg from a hematoma that required debridement.  He also had skin avulsion injuries on his right arm.  The right arm healed, however the right lower extremity swelling has been slow.  He is followed in the outpatient setting by Dr. Sharol Given.  On exam, Robert Dickerson was doing well.  He states the wound is much smaller than it was 3 months ago.  Is been wearing compression stockings, and recently had a procedure for a skin substitute placed by Dr. Sharol Given.  He denies drainage, patient is a longstanding diabetic with neuropathy in bilateral feet.  He denies previous wounds, claudication, or rest pain.  The pt is not on a statin for cholesterol management.  The pt is not on a daily aspirin.   Other AC:  - The pt is  on medication for hypertension.   The pt is  diabetic.  Tobacco hx:  former  Past Medical History:  Diagnosis Date   Arthritis    all joints   Chronic diastolic CHF (congestive heart failure) (HCC)    Chronic kidney disease (CKD), stage IV (severe) (HCC)    Chronic kidney disease, stage 3 (HCC)    Congestive heart failure with LV diastolic dysfunction, NYHA class 2 ([place])    Family history of adverse reaction to anesthesia    father- change in personaility   GERD (gastroesophageal reflux disease)    Hypertension    Hypertension associated with diabetes ([place])    Hypertensive chronic kidney disease    Insulin dependent type 2 diabetes mellitus (HCC)    Neuropathy    OSA on CPAP    Sleep apnea    uses cpap, pt does not know settings    Past Surgical History:  Procedure Laterality Date   ANTERIOR LAT LUMBAR FUSION Left 01/22/2021   Procedure: Lumbar one-Lumbar two Lateral Lumbar Interbody Fusion;  Surgeon: Vallarie Mare, MD;  Location: [facility];  Service:  Neurosurgery;  Laterality: Left;   ANTERIOR LAT LUMBAR FUSION  01/22/2021   APPENDECTOMY     BACK SURGERY     4 surg, lower   CHOLECYSTECTOMY     COLONOSCOPY WITH PROPOFOL N/A 06/11/2014   Procedure: COLONOSCOPY WITH PROPOFOL;  Surgeon: Garlan Fair, MD;  [facility];  Service: Endoscopy;  Laterality: N/A;   ESOPHAGOGASTRODUODENOSCOPY  08/26/2011   Procedure: ESOPHAGOGASTRODUODENOSCOPY (EGD);  Surgeon: Garlan Fair, MD;  [facility];  Service: Endoscopy;  Laterality: N/A;   ESOPHAGOGASTRODUODENOSCOPY (EGD) WITH PROPOFOL N/A 06/11/2014   Procedure: ESOPHAGOGASTRODUODENOSCOPY (EGD) WITH PROPOFOL;  Surgeon: Garlan Fair, MD;  [facility];  Service: Endoscopy;  Laterality: N/A;   EUS  09/15/2011   Procedure: UPPER ENDOSCOPIC ULTRASOUND (EUS) LINEAR;  Surgeon: Landry Dyke, MD;  [facility];  Service: Endoscopy;  Laterality: N/A;  MAC   I & D EXTREMITY Right 05/20/2021   Procedure: IRRIGATION AND DEBRIDEMENT OF LEG AND APPLICATION OF SKIN GRAFT;  Surgeon: Newt Minion, MD;  Location: [facility];  Service: Orthopedics;  Laterality: Right;   I & D EXTREMITY Right 07/03/2021   Procedure: REPEAT RIGHT LEG DEBRIDEMENT;  Surgeon: Newt Minion, MD;  Location: [facility];  Service: Orthopedics;  Laterality: Right;   I & D EXTREMITY Right 07/01/2021  Procedure: RIGHT LEG DEBRIDEMENT;  Surgeon: Newt Minion, MD;  Location: [facility];  Service: Orthopedics;  Laterality: Right;   KNEE ARTHROSCOPY Right YEARS AGO    Social History   Socioeconomic History   Marital status: Married    Spouse name: Judeen Hammans   Number of children: Not on file   Years of education: Not on file   Highest education level: Not on file  Occupational History   Occupation: retired  Tobacco Use   Smoking status: Former    Packs/day: 3.00    Years: 20.00    Additional pack years: 0.00    Total pack years: 60.00    Types: Cigarettes    Quit date: 1984    Years since quitting:  40.2   Smokeless tobacco: Never  Vaping Use   Vaping Use: Never used  Substance and Sexual Activity   Alcohol use: Not Currently   Drug use: Never   Sexual activity: Not on file  Other Topics Concern   Not on file  Social History Narrative   ** Merged History Encounter **       Social Determinants of Health   Financial Resource Strain: Low Risk  (08/27/2021)   Overall Financial Resource Strain (CARDIA)    Difficulty of Paying Living Expenses: Not hard at all  Food Insecurity: No Food Insecurity (08/27/2021)   Hunger Vital Sign    Worried About Running Out of Food in the Last Year: Never true    [place] in the Last Year: Never true  Transportation Needs: No Transportation Needs (08/27/2021)   PRAPARE - Hydrologist (Medical): No    Lack of Transportation (Non-Medical): No  Physical Activity: Not on file  Stress: Not on file  Social Connections: Not on file  Intimate Partner Violence: Not on file    Family History  Problem Relation Age of Onset   Hypertension Mother    Anesthesia problems Neg Hx    Hypotension Neg Hx    Malignant hyperthermia Neg Hx    Pseudochol deficiency Neg Hx     Current Outpatient Medications  Medication Sig Dispense Refill   ACCU-CHEK GUIDE test strip daily before breakfast.     Accu-Chek Softclix Lancets lancets daily before breakfast.     allopurinol (ZYLOPRIM) 300 MG tablet Take 300 mg by mouth daily.     atorvastatin (LIPITOR) 20 MG tablet TAKE 1 TABLET BY MOUTH DAILY 90 tablet 3   blood glucose meter kit and supplies KIT Dispense based on patient and insurance preference. Use up to four times daily as directed. (Patient taking differently: daily before breakfast.) 1 each 0   blood glucose meter kit and supplies by Other route as directed. Check blood sugar every morning     clobetasol cream (TEMOVATE) [id number] % Apply 1 Application topically 2 (two) times daily. 60 g tube x 8 for a 60-month supply. 60 g 8    furosemide (LASIX) 80 MG tablet Take 1 tablet (80 mg total) by mouth daily. 30 tablet 2   gabapentin (NEURONTIN) 300 MG capsule Take 300 mg by mouth 2 (two) times daily.     hydrALAZINE (APRESOLINE) 10 MG tablet TAKE 1 TABLET BY MOUTH EVERY 8  HOURS 270 tablet 3   Insulin Pen Needle (PENTIPS) 32G X 6 MM MISC      isosorbide dinitrate (ISORDIL) 10 MG tablet TAKE 1 TABLET BY MOUTH 3 TIMES  DAILY 270 tablet 3   LANTUS SOLOSTAR 100 UNIT/ML  Solostar Pen Inject 10 Units into the skin in the morning.     metolazone (ZAROXOLYN) 5 MG tablet Take 5 mg by mouth daily.     metoprolol tartrate (LOPRESSOR) 25 MG tablet TAKE 1 TABLET BY MOUTH TWICE  DAILY 180 tablet 3   mupirocin ointment (BACTROBAN) 2 % Apply 1 Application topically as needed.     NON FORMULARY CPAP  at bedtime     pentoxifylline (TRENTAL) 400 MG CR tablet TAKE 1 TABLET BY MOUTH 3 TIMES  DAILY WITH MEALS 90 tablet 11   potassium chloride SA (KLOR-CON M) 20 MEQ tablet Take 20 mEq by mouth daily.     predniSONE (DELTASONE) 5 MG tablet Take 5 mg by mouth daily with breakfast.     No current facility-administered medications for this visit.    Allergies  Allergen Reactions   Oxycodone Hcl Other (See Comments)    Other reaction(s): "out of it" per pt   Ace Inhibitors Cough    Other reaction(s): cough   Lisinopril Cough   Oxycodone Other (See Comments)    "out of it"      REVIEW OF SYSTEMS:   [X]  denotes positive finding, [ ]  denotes negative finding Cardiac  Comments:  Chest pain or chest pressure:    Shortness of breath upon exertion:    Short of breath when lying flat:    Irregular heart rhythm:        Vascular    Pain in calf, thigh, or hip brought on by ambulation:    Pain in feet at night that wakes you up from your sleep:     Blood clot in your veins:    Leg swelling:         Pulmonary    Oxygen at home:    Productive cough:     Wheezing:         Neurologic    Sudden weakness in arms or legs:     Sudden  numbness in arms or legs:     Sudden onset of difficulty speaking or slurred speech:    Temporary loss of vision in one eye:     Problems with dizziness:         Gastrointestinal    Blood in stool:     Vomited blood:         Genitourinary    Burning when urinating:     Blood in urine:        Psychiatric    Major depression:         Hematologic    Bleeding problems:    Problems with blood clotting too easily:        Skin    Rashes or ulcers:        Constitutional    Fever or chills:      PHYSICAL EXAMINATION:  There were no vitals filed for this visit.   General:  WDWN in NAD; vital signs documented above Gait: Not observed HENT: WNL, normocephalic Pulmonary: normal non-labored breathing , without wheezing Cardiac: AFib Abdomen: soft, NT, no masses Skin: without rashes Vascular Exam/Pulses:  Right Left  Radial 2+ (normal) 2+ (normal)  Ulnar 2+ (normal) 2+ (normal)  Femoral    Popliteal    DP 2+ (normal) 2+ (normal)  PT absent 2+ (normal)   Extremities: without ischemic changes, without Gangrene , without cellulitis; with open wounds; 10x8cm 2022   2023   09/2021   Musculoskeletal: no muscle wasting or atrophy  Neurologic: A&O X  3;  No focal weakness or paresthesias are detected Psychiatric:  The pt has Normal affect.   Non-Invasive Vascular Imaging:     +-------+-----------+-----------+------------+------------+  ABI/TBIToday's ABIToday's TBIPrevious ABIPrevious TBI  +-------+-----------+-----------+------------+------------+  Right  0.87       0.47                                 +-------+-----------+-----------+------------+------------+  Left   0.97       0.60                                 +-------+-----------+-----------+------------+------------+     ASSESSMENT/PLAN: AROLDO ROKUSEK is a 82 y.o. male presenting with traumatic wound to the right leg which has been slow to heal.  Nevertheless, the wound is healing, and  appears to be both clean and superficial.  At his last visit, I called Dr. Sharol Given regarding his care.  ABIs demonstrate mild to moderate peripheral arterial disease bilaterally.  We agreed to hold off on angiography as the wound continue to heal.  On exam today, the wound is still present, but much smaller than when he was seen last - judging by images.  He recently had a skin substitute placed by Dr. Sharol Given to further help the healing process.    The location of the wound is most consistent with wound seen in individuals who have chronic venous insufficiency.  The traumatic mechanism, as well as the size of the wound has likely led to some amount of venous disease. His heart failure is also hurting stagnating the healing process.  Being that the wound is healing, I plan to see him back in 1 year with ABI.  Should wound healing stagnate, we will discuss diagnostic angiography with possible intervention, however his kidney function and dialysis will be a major topic of concern.  Also, if wound healing stagnates, I will order a right lower extremity venous duplex ultrasound to assess for chronic venous insufficiency.   Recommend the following which can slow the progression of atherosclerosis and reduce the risk of major adverse cardiac / limb events:  Aspirin 81mg  PO QD.  Atorvastatin 40-80mg  PO QD (or other "high intensity" statin therapy). Complete cessation from all tobacco products. Blood glucose control with goal A1c < 7%. Blood pressure control with goal blood pressure < 140/90 mmHg. Lipid reduction therapy with goal LDL-C <100 mg/dL (<70 if symptomatic from PAD).     Robert John, MD [facility] [phone]

## 2022-10-15 ENCOUNTER — Encounter: Payer: Self-pay | Admitting: Vascular Surgery

## 2022-10-15 ENCOUNTER — Ambulatory Visit (INDEPENDENT_AMBULATORY_CARE_PROVIDER_SITE_OTHER): Payer: Medicare Other | Admitting: Vascular Surgery

## 2022-10-15 VITALS — BP 108/63 | HR 70 | Temp 98.0°F | Resp 20 | Ht 69.0 in | Wt 237.0 lb

## 2022-10-15 DIAGNOSIS — L98499 Non-pressure chronic ulcer of skin of other sites with unspecified severity: Secondary | ICD-10-CM | POA: Diagnosis not present

## 2022-10-18 ENCOUNTER — Ambulatory Visit (INDEPENDENT_AMBULATORY_CARE_PROVIDER_SITE_OTHER): Payer: Medicare Other | Admitting: Orthopedic Surgery

## 2022-10-18 ENCOUNTER — Encounter: Payer: Self-pay | Admitting: Orthopedic Surgery

## 2022-10-18 DIAGNOSIS — I739 Peripheral vascular disease, unspecified: Secondary | ICD-10-CM

## 2022-10-18 DIAGNOSIS — S81811D Laceration without foreign body, right lower leg, subsequent encounter: Secondary | ICD-10-CM | POA: Diagnosis not present

## 2022-10-18 NOTE — Progress Notes (Signed)
Office Visit Note   Patient: Robert Dickerson           Date of Birth: 1940/09/16           MRN: [id number] Visit Date: 10/18/2022              Requested by: Kathalene Frames, MD [address] [place],  [place] [id number] PCP: Kathalene Frames, MD  Chief Complaint  Patient presents with   Right Leg - Follow-up    Application Kerecis graft 10/07/2022      HPI: Patient is a 82 year old gentleman who presents in follow-up for an office Kerecis tissue graft May 7 and 14.  Assessment & Plan: Visit Diagnoses:  1. Laceration of right lower leg, subsequent encounter   2. PVD (peripheral vascular disease) ([place])     Plan: Patient will continue with elevation and compression daily dressing changes.  Follow-Up Instructions: Return in about 1 week (around 10/25/2022).   Ortho Exam  Patient is alert, oriented, no adenopathy, well-dressed, normal affect, normal respiratory effort. Examination the wound bed has healthy granulation tissue.  It measures 2 x 2 cm in the largest dimension.  4 x 4 and an Ace wrap was applied.  Imaging: No results found.    Labs: Lab Results  Component Value Date   HGBA1C 8.2 (H) 08/25/2021   HGBA1C 7.6 (H) 05/19/2021   HGBA1C 9.5 (H) 12/23/2020   LABURIC 6.4 03/04/2011   REPTSTATUS 07/06/2021 FINAL 07/01/2021   GRAMSTAIN NO WBC SEEN NO ORGANISMS SEEN  07/01/2021   CULT  07/01/2021    FEW STAPHYLOCOCCUS AUREUS RARE PROTEUS MIRABILIS NO ANAEROBES ISOLATED Performed at [facility], [place] [address], [place], [place] [address]    [place] 07/01/2021   LABORGA PROTEUS MIRABILIS 07/01/2021     Lab Results  Component Value Date   ALBUMIN 2.4 (L) 08/27/2021   ALBUMIN 2.5 (L) 08/25/2021   ALBUMIN 2.6 (L) 06/05/2021    Lab Results  Component Value Date   MG 2.2 08/25/2021   MG 2.2 05/19/2021   MG 2.0 12/23/2020   No results found for: "VD25OH"  No results found for: "PREALBUMIN"     Latest Ref Rng & Units 08/28/2021    2:58 AM 08/27/2021    4:26 AM 08/26/2021    6:15 AM  CBC EXTENDED  WBC 4.0 - 10.5 K/uL 7.6  10.2  14.4   RBC 4.22 - 5.81 MIL/uL 3.97  3.98  4.12   Hemoglobin 13.0 - 17.0 g/dL 11.2  11.2  11.7   HCT 39.0 - 52.0 % 33.9  34.4  36.2   Platelets 150 - 400 K/uL 313  329  300      There is no height or weight on file to calculate BMI.  Orders:  No orders of the defined types were placed in this encounter.  No orders of the defined types were placed in this encounter.    Procedures: No procedures performed  Clinical Data: No additional findings.  ROS:  All other systems negative, except as noted in the HPI. Review of Systems  Objective: Vital Signs: There were no vitals taken for this visit.  Specialty Comments:  No specialty comments available.  PMFS History: Patient Active Problem List   Diagnosis Date Noted   Aortic atherosclerosis ([place]) 09/04/2021   Hypercholesterolemia 09/04/2021   Unspecified atrial fibrillation ([place]) 08/25/2021   Acute on chronic diastolic CHF (congestive heart failure) ([place]) 08/25/2021   Wound of  right leg, sequela 07/01/2021   Nail, injury by 06/10/2021   Laceration of leg 05/20/2021   Acute respiratory failure with hypoxia ([place]) 05/18/2021   Chronic combined systolic and diastolic heart failure ([place]) 05/18/2021   Chronic kidney disease (CKD), stage IV (severe) ([place]) 05/18/2021   Hypertension associated with diabetes ([place]) 05/18/2021   Insulin dependent type 2 diabetes mellitus ([place]) 05/18/2021   OSA on CPAP 05/18/2021   Laceration of right lower extremity 05/18/2021   Current chronic use of systemic steroids 01/08/2021   Preoperative cardiovascular examination 01/08/2021   Cellulitis 12/23/2020   Benign neoplasm of duodenum, jejunum, 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   Chronic kidney disease, stage 4 (severe) ([place]) 12/03/2020   Degeneration of lumbar intervertebral disc 12/03/2020    Type 2 diabetes mellitus with stage 4 chronic kidney disease ([place]) 12/03/2020   Diabetic peripheral neuropathy associated with type 2 diabetes mellitus ([place]) [id number]   Diastolic heart failure ([place]) 12/03/2020   Enlarged prostate 12/03/2020   Erectile dysfunction due to arterial insufficiency 12/03/2020   Essential hypertension 12/03/2020   Gastro-esophageal reflux disease without esophagitis 12/03/2020   Hematuria 12/03/2020   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   Morbid obesity ([place]) 12/03/2020   Osteoarthritis of hip 12/03/2020   Osteoarthritis of knee 12/03/2020   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   Restless legs 12/03/2020   Skin sensation disturbance 12/03/2020   Lumbar stenosis 11/21/2020   Tendinitis 09/03/2020   Subacute arthropathy 09/03/2020   Bilateral hearing loss 08/11/2020   Impacted cerumen of left ear 03/10/2017   Excessive cerumen in left ear canal 03/10/2017   Faintness 12/09/2015   Backache 11/14/2014   CHRONIC PANCREATITIS 06/16/2009   Past Medical History:  Diagnosis Date   Arthritis    all joints   Chronic diastolic CHF (congestive heart failure) (HCC)    Chronic kidney disease (CKD), stage IV (severe) (HCC)    Chronic kidney disease, stage 3 (HCC)    Congestive heart failure with LV diastolic dysfunction, NYHA class 2 ([place])    Family history of adverse reaction to anesthesia    father- change in personaility   GERD (gastroesophageal reflux disease)    Hypertension    Hypertension associated with diabetes ([place])    Hypertensive chronic kidney disease    Insulin dependent type 2 diabetes mellitus ([place])    Neuropathy    OSA on CPAP    Sleep apnea    uses cpap, pt does not know settings    Family History  Problem Relation Age of Onset   Hypertension Mother    Anesthesia problems Neg Hx    Hypotension Neg Hx    Malignant hyperthermia Neg Hx    Pseudochol deficiency Neg Hx     Past Surgical History:  Procedure  Laterality Date   ANTERIOR LAT LUMBAR FUSION Left 01/22/2021   Procedure: Lumbar one-Lumbar two Lateral Lumbar Interbody Fusion;  Surgeon: Vallarie Mare, MD;  Location: [facility];  Service: Neurosurgery;  Laterality: Left;   ANTERIOR LAT LUMBAR FUSION  01/22/2021   APPENDECTOMY     BACK SURGERY     4 surg, lower   CHOLECYSTECTOMY     COLONOSCOPY WITH PROPOFOL N/A 06/11/2014   Procedure: COLONOSCOPY WITH PROPOFOL;  Surgeon: Garlan Fair, MD;  [facility];  Service: Endoscopy;  Laterality: N/A;   ESOPHAGOGASTRODUODENOSCOPY  08/26/2011   Procedure: ESOPHAGOGASTRODUODENOSCOPY (EGD);  Surgeon: Garlan Fair, MD;  [facility];  Service: Endoscopy;  Laterality: N/A;  ESOPHAGOGASTRODUODENOSCOPY (EGD) WITH PROPOFOL N/A 06/11/2014   Procedure: ESOPHAGOGASTRODUODENOSCOPY (EGD) WITH PROPOFOL;  Surgeon: Garlan Fair, MD;  [facility];  Service: Endoscopy;  Laterality: N/A;   EUS  09/15/2011   Procedure: UPPER ENDOSCOPIC ULTRASOUND (EUS) LINEAR;  Surgeon: Landry Dyke, MD;  [facility];  Service: Endoscopy;  Laterality: N/A;  MAC   I & D EXTREMITY Right 05/20/2021   Procedure: IRRIGATION AND DEBRIDEMENT OF LEG AND APPLICATION OF SKIN GRAFT;  Surgeon: Newt Minion, MD;  Location: [facility];  Service: Orthopedics;  Laterality: Right;   I & D EXTREMITY Right 07/03/2021   Procedure: REPEAT RIGHT LEG DEBRIDEMENT;  Surgeon: Newt Minion, MD;  Location: [facility];  Service: Orthopedics;  Laterality: Right;   I & D EXTREMITY Right 07/01/2021   Procedure: RIGHT LEG DEBRIDEMENT;  Surgeon: Newt Minion, MD;  Location: [facility];  Service: Orthopedics;  Laterality: Right;   KNEE ARTHROSCOPY Right YEARS AGO   Social History   Occupational History   Occupation: retired  Tobacco Use   Smoking status: Former    Packs/day: 3.00    Years: 20.00    Additional pack years: 0.00    Total pack years: 60.00    Types: Cigarettes    Quit date: 1984    Years since  quitting: 40.2   Smokeless tobacco: Never  Vaping Use   Vaping Use: Never used  Substance and Sexual Activity   Alcohol use: Not Currently   Drug use: Never   Sexual activity: Not on file

## 2022-10-25 ENCOUNTER — Ambulatory Visit (INDEPENDENT_AMBULATORY_CARE_PROVIDER_SITE_OTHER): Payer: Medicare Other | Admitting: Orthopedic Surgery

## 2022-10-25 ENCOUNTER — Encounter: Payer: Self-pay | Admitting: Orthopedic Surgery

## 2022-10-25 DIAGNOSIS — S81811D Laceration without foreign body, right lower leg, subsequent encounter: Secondary | ICD-10-CM | POA: Diagnosis not present

## 2022-10-25 DIAGNOSIS — M170 Bilateral primary osteoarthritis of knee: Secondary | ICD-10-CM

## 2022-10-25 NOTE — Progress Notes (Signed)
Office Visit Note   Patient: Robert Dickerson           Date of Birth: 05-08-1941           MRN: [id number] Visit Date: 10/25/2022              Requested by: Kathalene Frames, MD [address], [place],  [place] [address] PCP: Kathalene Frames, MD  Chief Complaint  Patient presents with   Right Leg - Follow-up      HPI: Patient is a 82 year old gentleman who presents for 2 separate issues.  #1 patient has osteoarthritis bilateral knees he has undergone steroid injections and hyaluronic acid injections in the past with temporary relief.  Patient states he has recurrent pain at this time.  Patient is also status post in office Kerecis tissue graft on March 7 and March 14 in the office for the right lower extremity wound.  Assessment & Plan: Visit Diagnoses:  1. Laceration of right lower leg, subsequent encounter   2. Bilateral primary osteoarthritis of knee     Plan: Both knees were injected he tolerated this well.  Continue Dial soap cleansing and compression wraps for the right lower extremity wound.  Follow-Up Instructions: Return in about 2 weeks (around 11/08/2022).   Ortho Exam  Patient is alert, oriented, no adenopathy, well-dressed, normal affect, normal respiratory effort. Examination patient has interval improvement in the right lower extremity wound.  After debridement the wound has 100% healthy granulation tissue and measures 22 x 26 x 1 mm.  There is no exposed bows bone or tendon no cellulitis no drainage.  Examination of both knees patient has crepitation with range of motion.  Collaterals and cruciates are stable.  Medial lateral joint lines are tender to palpation.  Imaging: No results found.   Labs: Lab Results  Component Value Date   HGBA1C 8.2 (H) 08/25/2021   HGBA1C 7.6 (H) 05/19/2021   HGBA1C 9.5 (H) 12/23/2020   LABURIC 6.4 03/04/2011   REPTSTATUS 07/06/2021 FINAL 07/01/2021   GRAMSTAIN NO WBC SEEN NO ORGANISMS SEEN   07/01/2021   CULT  07/01/2021    FEW STAPHYLOCOCCUS AUREUS RARE PROTEUS MIRABILIS NO ANAEROBES ISOLATED Performed at [facility], [place] [address], [place], [place] [address]    [place] 07/01/2021   LABORGA PROTEUS MIRABILIS 07/01/2021     Lab Results  Component Value Date   ALBUMIN 2.4 (L) 08/27/2021   ALBUMIN 2.5 (L) 08/25/2021   ALBUMIN 2.6 (L) 06/05/2021    Lab Results  Component Value Date   MG 2.2 08/25/2021   MG 2.2 05/19/2021   MG 2.0 12/23/2020   No results found for: "VD25OH"  No results found for: "PREALBUMIN"    Latest Ref Rng & Units 08/28/2021    2:58 AM 08/27/2021    4:26 AM 08/26/2021    6:15 AM  CBC EXTENDED  WBC 4.0 - 10.5 K/uL 7.6  10.2  14.4   RBC 4.22 - 5.81 MIL/uL 3.97  3.98  4.12   Hemoglobin 13.0 - 17.0 g/dL 11.2  11.2  11.7   HCT 39.0 - 52.0 % 33.9  34.4  36.2   Platelets 150 - 400 K/uL 313  329  300      There is no height or weight on file to calculate BMI.  Orders:  No orders of the defined types were placed in this encounter.  No orders of the defined types were placed in this encounter.  Procedures: Large Joint Inj: bilateral knee on 10/25/2022 11:35 AM Indications: pain and diagnostic evaluation Details: 22 G 1.5 in needle, anteromedial approach  Arthrogram: No  Outcome: tolerated well, no immediate complications Procedure, treatment alternatives, risks and benefits explained, specific risks discussed. Consent was given by the patient. Immediately prior to procedure a time out was called to verify the correct patient, procedure, equipment, support staff and site/side marked as required. Patient was prepped and draped in the usual sterile fashion.      Clinical Data: No additional findings.  ROS:  All other systems negative, except as noted in the HPI. Review of Systems  Objective: Vital Signs: There were no vitals taken for this visit.  Specialty Comments:  No specialty comments  available.  PMFS History: Patient Active Problem List   Diagnosis Date Noted   Aortic atherosclerosis ([place]) 09/04/2021   Hypercholesterolemia 09/04/2021   Unspecified atrial fibrillation ([place]) 08/25/2021   Acute on chronic diastolic CHF (congestive heart failure) ([place]) 08/25/2021   Wound of right leg, sequela 07/01/2021   Nail, injury by 06/10/2021   Laceration of leg 05/20/2021   Acute respiratory failure with hypoxia ([place]) 05/18/2021   Chronic combined systolic and diastolic heart failure ([place]) 05/18/2021   Chronic kidney disease (CKD), stage IV (severe) ([place]) 05/18/2021   Hypertension associated with diabetes ([place]) 05/18/2021   Insulin dependent type 2 diabetes mellitus ([place]) 05/18/2021   OSA on CPAP 05/18/2021   Laceration of right lower extremity 05/18/2021   Current chronic use of systemic steroids 01/08/2021   Preoperative cardiovascular examination 01/08/2021   Cellulitis 12/23/2020   Benign neoplasm of duodenum, jejunum, 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   Chronic kidney disease, stage 4 (severe) ([place]) 12/03/2020   Degeneration of lumbar intervertebral disc 12/03/2020   Type 2 diabetes mellitus with stage 4 chronic kidney disease ([place]) 12/03/2020   Diabetic peripheral neuropathy associated with type 2 diabetes mellitus ([place]) [id number]   Diastolic heart failure ([place]) 12/03/2020   Enlarged prostate 12/03/2020   Erectile dysfunction due to arterial insufficiency 12/03/2020   Essential hypertension 12/03/2020   Gastro-esophageal reflux disease without esophagitis 12/03/2020   Hematuria 12/03/2020   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   Morbid obesity ([place]) 12/03/2020   Osteoarthritis of hip 12/03/2020   Osteoarthritis of knee 12/03/2020   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   Restless legs 12/03/2020   Skin sensation disturbance 12/03/2020   Lumbar stenosis 11/21/2020   Tendinitis 09/03/2020   Subacute arthropathy  09/03/2020   Bilateral hearing loss 08/11/2020   Impacted cerumen of left ear 03/10/2017   Excessive cerumen in left ear canal 03/10/2017   Faintness 12/09/2015   Backache 11/14/2014   CHRONIC PANCREATITIS 06/16/2009   Past Medical History:  Diagnosis Date   Arthritis    all joints   Chronic diastolic CHF (congestive heart failure) (H[facility])    Chronic kidney disease (CKD), stage IV (severe) (H[facility])    Chronic kidney disease, stage 3 (HCC)    Congestive heart failure with LV diastolic dysfunction, NYHA class 2 ([place])    Family history of adverse reaction to anesthesia    father- change in personaility   GERD (gastroesophageal reflux disease)    Hypertension    Hypertension associated with diabetes ([place])    Hypertensive chronic kidney disease    Insulin dependent type 2 diabetes mellitus ([place])    Neuropathy    OSA on CPAP    Sleep apnea    uses cpap, pt does not know  settings    Family History  Problem Relation Age of Onset   Hypertension Mother    Anesthesia problems Neg Hx    Hypotension Neg Hx    Malignant hyperthermia Neg Hx    Pseudochol deficiency Neg Hx     Past Surgical History:  Procedure Laterality Date   ANTERIOR LAT LUMBAR FUSION Left 01/22/2021   Procedure: Lumbar one-Lumbar two Lateral Lumbar Interbody Fusion;  Surgeon: Vallarie Mare, MD;  Location: [facility];  Service: Neurosurgery;  Laterality: Left;   ANTERIOR LAT LUMBAR FUSION  01/22/2021   APPENDECTOMY     BACK SURGERY     4 surg, lower   CHOLECYSTECTOMY     COLONOSCOPY WITH PROPOFOL N/A 06/11/2014   Procedure: COLONOSCOPY WITH PROPOFOL;  Surgeon: Garlan Fair, MD;  [facility]: [facility];  Service: Endoscopy;  Laterality: N/A;   ESOPHAGOGASTRODUODENOSCOPY  08/26/2011   Procedure: ESOPHAGOGASTRODUODENOSCOPY (EGD);  Surgeon: Garlan Fair, MD;  Location: [facility];  Service: Endoscopy;  Laterality: N/A;   ESOPHAGOGASTRODUODENOSCOPY (EGD) WITH PROPOFOL N/A 06/11/2014   Procedure:  ESOPHAGOGASTRODUODENOSCOPY (EGD) WITH PROPOFOL;  Surgeon: Garlan Fair, MD;  [facility];  Service: Endoscopy;  Laterality: N/A;   EUS  09/15/2011   Procedure: UPPER ENDOSCOPIC ULTRASOUND (EUS) LINEAR;  Surgeon: Landry Dyke, MD;  [facility];  Service: Endoscopy;  Laterality: N/A;  MAC   I & D EXTREMITY Right 05/20/2021   Procedure: IRRIGATION AND DEBRIDEMENT OF LEG AND APPLICATION OF SKIN GRAFT;  Surgeon: Newt Minion, MD;  Location: [facility];  Service: Orthopedics;  Laterality: Right;   I & D EXTREMITY Right 07/03/2021   Procedure: REPEAT RIGHT LEG DEBRIDEMENT;  Surgeon: Newt Minion, MD;  Location: [facility];  Service: Orthopedics;  Laterality: Right;   I & D EXTREMITY Right 07/01/2021   Procedure: RIGHT LEG DEBRIDEMENT;  Surgeon: Newt Minion, MD;  Location: [facility];  Service: Orthopedics;  Laterality: Right;   KNEE ARTHROSCOPY Right YEARS AGO   Social History   Occupational History   Occupation: retired  Tobacco Use   Smoking status: Former    Packs/day: 3.00    Years: 20.00    Additional pack years: 0.00    Total pack years: 60.00    Types: Cigarettes    Quit date: 1984    Years since quitting: 40.2   Smokeless tobacco: Never  Vaping Use   Vaping Use: Never used  Substance and Sexual Activity   Alcohol use: Not Currently   Drug use: Never   Sexual activity: Not on file

## 2022-11-08 ENCOUNTER — Ambulatory Visit (INDEPENDENT_AMBULATORY_CARE_PROVIDER_SITE_OTHER): Payer: Medicare Other | Admitting: Orthopedic Surgery

## 2022-11-08 ENCOUNTER — Telehealth: Payer: Self-pay | Admitting: Orthopedic Surgery

## 2022-11-08 DIAGNOSIS — I87331 Chronic venous hypertension (idiopathic) with ulcer and inflammation of right lower extremity: Secondary | ICD-10-CM | POA: Diagnosis not present

## 2022-11-08 DIAGNOSIS — I739 Peripheral vascular disease, unspecified: Secondary | ICD-10-CM

## 2022-11-08 NOTE — Telephone Encounter (Signed)
Patient states he is supposed to come in Monday at 10 per DUDA, no openings. He states he will be here [place] at 10. Can yall schedule him.

## 2022-11-08 NOTE — Telephone Encounter (Signed)
Pt scheduled at 10 next Monday

## 2022-11-11 ENCOUNTER — Ambulatory Visit: Payer: Medicare Other | Admitting: Orthopedic Surgery

## 2022-11-15 ENCOUNTER — Ambulatory Visit (INDEPENDENT_AMBULATORY_CARE_PROVIDER_SITE_OTHER): Payer: Medicare Other | Admitting: Orthopedic Surgery

## 2022-11-15 DIAGNOSIS — I87331 Chronic venous hypertension (idiopathic) with ulcer and inflammation of right lower extremity: Secondary | ICD-10-CM | POA: Diagnosis not present

## 2022-11-15 DIAGNOSIS — I739 Peripheral vascular disease, unspecified: Secondary | ICD-10-CM

## 2022-11-19 ENCOUNTER — Encounter: Payer: Self-pay | Admitting: Orthopedic Surgery

## 2022-11-19 NOTE — Progress Notes (Signed)
Office Visit Note   Patient: Robert Dickerson           Date of Birth: Nov 22, 1940           MRN: [id number] Visit Date: 11/08/2022              Requested by: [name], MD 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  Patient has had Kerecis grafting in the office on March 7 and March 14.  Assessment & Plan: Visit Diagnoses:  1. PVD (peripheral vascular disease)   2. Chronic venous hypertension (idiopathic) with ulcer and inflammation of right lower extremity     Plan: The wound was debrided 8 cm of Kerecis micro was applied.  Follow-Up Instructions: Return in about 1 week (around 11/15/2022).   Ortho Exam  Patient is alert, oriented, no adenopathy, well-dressed, normal affect, normal respiratory effort. Examination patient has persistent venous insufficiency ulcer.  After informed consent this was debrided with 10 blade knife.  Postdebridement wound is 35 x 25 mm.  The wound healing has stalled, the wound bed has healthy granulation tissue, and patient presents for evaluation and application of Kerecis MariGen Micro graft. After informed consent a 10 blade knife was used to debride the skin and soft tissue to healthy viable bleeding granulation tissue.  Silver nitrate was used for hemostasis. The wound measures: 3.5 cm in length, 2.5 cm  in width, 1 mm in depth, wound location right calf Kerecis MariGen micro tissue graft 8 cm2 was applied, and there was no wastage.  Please see the photo below of the Lot number and expiration date. The micro tissue graft was covered with a nonadherent Adaptic dressing, bolstered with 4 x 4 gauze and secured with a compression wrap.     Imaging: No results found.    Labs: Lab  Results  Component Value Date   HGBA1C 8.2 (H) 08/25/2021   HGBA1C 7.6 (H) 05/19/2021   HGBA1C 9.5 (H) 12/23/2020   LABURIC 6.4 03/04/2011   REPTSTATUS 07/06/2021 FINAL 07/01/2021   GRAMSTAIN NO WBC SEEN NO ORGANISMS SEEN  07/01/2021   CULT  07/01/2021    FEW STAPHYLOCOCCUS AUREUS RARE PROTEUS MIRABILIS NO ANAEROBES ISOLATED Performed at [facility], [address] [place], [place] [address]    [facility] STAPHYLOCOCCUS AUREUS 07/01/2021   LABORGA PROTEUS MIRABILIS 07/01/2021     Lab Results  Component Value Date   ALBUMIN 2.4 (L) 08/27/2021   ALBUMIN 2.5 (L) 08/25/2021   ALBUMIN 2.6 (L) 06/05/2021    Lab Results  Component Value Date   MG 2.2 08/25/2021   MG 2.2 05/19/2021   MG 2.0 12/23/2020   No results found for: "VD25OH"  No results found for: "PREALBUMIN"    Latest Ref Rng & Units 08/28/2021    2:58 AM 08/27/2021    4:26 AM 08/26/2021    6:15 AM  CBC EXTENDED  WBC 4.0 - 10.5 K/uL 7.6  10.2  14.4   RBC 4.22 - 5.81 MIL/uL 3.97  3.98  4.12   Hemoglobin 13.0 - 17.0 g/dL 44.0  34.7  42.5   HCT 39.0 - 52.0 % 33.9  34.4  36.2   Platelets 150 - 400 K/uL 313  329  300  There is no height or weight on file to calculate BMI.  Orders:  No orders of the defined types were placed in this encounter.  No orders of the defined types were placed in this encounter.    Procedures: No procedures performed  Clinical Data: No additional findings.  ROS:  All other systems negative, except as noted in the HPI. Review of Systems  Objective: Vital Signs: There were no vitals taken for this visit.  Specialty Comments:  No specialty comments available.  PMFS History: Patient Active Problem List   Diagnosis Date Noted   Aortic atherosclerosis 09/04/2021   Hypercholesterolemia 09/04/2021   Unspecified atrial fibrillation 08/25/2021   Acute on chronic diastolic CHF (congestive heart failure) 08/25/2021   Wound of right leg, sequela 07/01/2021   Nail,  injury by 06/10/2021   Laceration of leg 05/20/2021   Acute respiratory failure with hypoxia 05/18/2021   Chronic combined systolic and diastolic heart failure 05/18/2021   Chronic kidney disease (CKD), stage IV (severe) 05/18/2021   Hypertension associated with diabetes 05/18/2021   Insulin dependent type 2 diabetes mellitus 05/18/2021   OSA on CPAP 05/18/2021   Laceration of right lower extremity 05/18/2021   Current chronic use of systemic steroids 01/08/2021   Preoperative cardiovascular examination 01/08/2021   Cellulitis 12/23/2020   Benign neoplasm of duodenum, jejunum, 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   Chronic kidney disease, stage 4 (severe) 12/03/2020   Degeneration of lumbar intervertebral disc 12/03/2020   Type 2 diabetes mellitus with stage 4 chronic kidney disease 12/03/2020   Diabetic peripheral neuropathy associated with type 2 diabetes mellitus 12/03/2020   Diastolic heart failure 12/03/2020   Enlarged prostate 12/03/2020   Erectile dysfunction due to arterial insufficiency 12/03/2020   Essential hypertension 12/03/2020   Gastro-esophageal reflux disease without esophagitis 12/03/2020   Hematuria 12/03/2020   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   Morbid obesity 12/03/2020   Osteoarthritis of hip 12/03/2020   Osteoarthritis of knee 12/03/2020   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   Restless legs 12/03/2020   Skin sensation disturbance 12/03/2020   Lumbar stenosis 11/21/2020   Tendinitis 09/03/2020   Subacute arthropathy 09/03/2020   Bilateral hearing loss 08/11/2020   Impacted cerumen of left ear 03/10/2017   Excessive cerumen in left ear canal 03/10/2017   Faintness 12/09/2015   Backache 11/14/2014   CHRONIC PANCREATITIS 06/16/2009   Past Medical History:  Diagnosis Date   Arthritis    all joints   Chronic diastolic CHF (congestive heart failure)    Chronic kidney disease (CKD), stage IV (severe)    Chronic  kidney disease, stage 3    Congestive heart failure with LV diastolic dysfunction, NYHA class 2    Family history of adverse reaction to anesthesia    father- change in personaility   GERD (gastroesophageal reflux disease)    Hypertension    Hypertension associated with diabetes    Hypertensive chronic kidney disease    Insulin dependent type 2 diabetes mellitus    Neuropathy    OSA on CPAP    Sleep apnea    uses cpap, pt does not know settings    Family History  Problem Relation Age of Onset   Hypertension Mother    Anesthesia problems Neg Hx    Hypotension Neg Hx    Malignant hyperthermia Neg Hx    Pseudochol deficiency Neg Hx     Past Surgical History:  Procedure Laterality Date   ANTERIOR LAT LUMBAR FUSION Left 01/22/2021  Procedure: Lumbar one-Lumbar two Lateral Lumbar Interbody Fusion;  Surgeon: Bedelia Person, MD;  Location: [facility];  Service: Neurosurgery;  Laterality: Left;   ANTERIOR LAT LUMBAR FUSION  01/22/2021   APPENDECTOMY     BACK SURGERY     4 surg, lower   CHOLECYSTECTOMY     COLONOSCOPY WITH PROPOFOL N/A 06/11/2014   Procedure: COLONOSCOPY WITH PROPOFOL;  Surgeon: Charolett Bumpers, MD;  [facility];  Service: Endoscopy;  Laterality: N/A;   ESOPHAGOGASTRODUODENOSCOPY  08/26/2011   Procedure: ESOPHAGOGASTRODUODENOSCOPY (EGD);  Surgeon: Charolett Bumpers, MD;  Location[facility];  Service: Endoscopy;  Laterality: N/A;   ESOPHAGOGASTRODUODENOSCOPY (EGD) WITH PROPOFOL N/A 06/11/2014   Procedure: ESOPHAGOGASTRODUODENOSCOPY (EGD) WITH PROPOFOL;  Surgeon: Charolett Bumpers, MD;  [facility];  Service: Endoscopy;  Laterality: N/A;   EUS  09/15/2011   Procedure: UPPER ENDOSCOPIC ULTRASOUND (EUS) LINEAR;  Surgeon: Freddy Jaksch, MD;  [facility];  Service: Endoscopy;  Laterality: N/A;  MAC   I & D EXTREMITY Right 05/20/2021   Procedure: IRRIGATION AND DEBRIDEMENT OF LEG AND APPLICATION OF SKIN GRAFT;  Surgeon: Nadara Mustard, MD;   Location[facility];  Service: Orthopedics;  Laterality: Right;   I & D EXTREMITY Right 07/03/2021   Procedure: REPEAT RIGHT LEG DEBRIDEMENT;  Surgeon: Nadara Mustard, MD;  Location: [facility] OR;  Service: Orthopedics;  Laterality: Right;   I & D EXTREMITY Right 07/01/2021   Procedure: RIGHT LEG DEBRIDEMENT;  Surgeon: Nadara Mustard, MD;  Location[facility];  Service: Orthopedics;  Laterality: Right;   KNEE ARTHROSCOPY Right YEARS AGO   Social History   Occupational History   Occupation: retired  Tobacco Use   Smoking status: Former    Packs/day: 3.00    Years: 20.00    Additional pack years: 0.00    Total pack years: 60.00    Types: Cigarettes    Quit date: 1984    Years since quitting: 40.3   Smokeless tobacco: Never  Vaping Use   Vaping Use: Never used  Substance and Sexual Activity   Alcohol use: Not Currently   Drug use: Never   Sexual activity: Not on file

## 2022-11-21 ENCOUNTER — Encounter: Payer: Self-pay | Admitting: Orthopedic Surgery

## 2022-11-21 NOTE — Progress Notes (Signed)
Office Visit Note   Patient: Robert Dickerson           Date of Birth: November 25, 1940           MRN: [id number] Visit Date: 11/15/2022              Requested by: [name], MD 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  Assessment & Plan: Visit Diagnoses:  1. Chronic venous hypertension (idiopathic) with ulcer and inflammation of right lower extremity   2. PVD (peripheral vascular disease)     Plan: Continue with Dial soap cleansing dry dressing changes with compression.  Follow-Up Instructions: Return in about 4 weeks (around 12/13/2022).   Ortho Exam  Patient is alert, oriented, no adenopathy, well-dressed, normal affect, normal respiratory effort. Examination the wound is 25 x 30 mm x 1 mm deep.  There is no surrounding cellulitis no odor no drainage.  There is healthy flat granulation tissue.  Fibrinous exudate was debrided.  Imaging: No results found.   Labs: Lab Results  Component Value Date   HGBA1C 8.2 (H) 08/25/2021   HGBA1C 7.6 (H) 05/19/2021   HGBA1C 9.5 (H) 12/23/2020   LABURIC 6.4 03/04/2011   REPTSTATUS 07/06/2021 FINAL 07/01/2021   GRAMSTAIN NO WBC SEEN NO ORGANISMS SEEN  07/01/2021   CULT  07/01/2021    FEW STAPHYLOCOCCUS AUREUS RARE PROTEUS MIRABILIS NO ANAEROBES ISOLATED Performed at [facility], [address] [place], [place] [address]    [facility] STAPHYLOCOCCUS AUREUS 07/01/2021   LABORGA PROTEUS MIRABILIS 07/01/2021     Lab Results  Component Value Date   ALBUMIN 2.4 (L) 08/27/2021   ALBUMIN 2.5 (L) 08/25/2021   ALBUMIN 2.6 (L) 06/05/2021    Lab Results  Component Value Date   MG 2.2 08/25/2021   MG 2.2 05/19/2021   MG 2.0 12/23/2020   No results  found for: "VD25OH"  No results found for: "PREALBUMIN"    Latest Ref Rng & Units 08/28/2021    2:58 AM 08/27/2021    4:26 AM 08/26/2021    6:15 AM  CBC EXTENDED  WBC 4.0 - 10.5 K/uL 7.6  10.2  14.4   RBC 4.22 - 5.81 MIL/uL 3.97  3.98  4.12   Hemoglobin 13.0 - 17.0 g/dL 62.1  30.8  65.7   HCT 39.0 - 52.0 % 33.9  34.4  36.2   Platelets 150 - 400 K/uL 313  329  300      There is no height or weight on file to calculate BMI.  Orders:  No orders of the defined types were placed in this encounter.  No orders of the defined types were placed in this encounter.    Procedures: No procedures performed  Clinical Data: No additional findings.  ROS:  All other systems negative, except as noted in the HPI. Review of Systems  Objective: Vital Signs: There were no vitals taken for this visit.  Specialty Comments:  No specialty comments available.  PMFS History: Patient Active Problem List   Diagnosis Date Noted   Aortic atherosclerosis 09/04/2021   Hypercholesterolemia 09/04/2021   Unspecified atrial fibrillation 08/25/2021   Acute on chronic diastolic CHF (congestive heart failure) 08/25/2021   Wound of right  leg, sequela 07/01/2021   Nail, injury by 06/10/2021   Laceration of leg 05/20/2021   Acute respiratory failure with hypoxia 05/18/2021   Chronic combined systolic and diastolic heart failure 05/18/2021   Chronic kidney disease (CKD), stage IV (severe) 05/18/2021   Hypertension associated with diabetes 05/18/2021   Insulin dependent type 2 diabetes mellitus 05/18/2021   OSA on CPAP 05/18/2021   Laceration of right lower extremity 05/18/2021   Current chronic use of systemic steroids 01/08/2021   Preoperative cardiovascular examination 01/08/2021   Cellulitis 12/23/2020   Benign neoplasm of duodenum, jejunum, 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   Chronic kidney disease, stage 4 (severe) 12/03/2020   Degeneration of lumbar intervertebral disc  12/03/2020   Type 2 diabetes mellitus with stage 4 chronic kidney disease 12/03/2020   Diabetic peripheral neuropathy associated with type 2 diabetes mellitus 12/03/2020   Diastolic heart failure 12/03/2020   Enlarged prostate 12/03/2020   Erectile dysfunction due to arterial insufficiency 12/03/2020   Essential hypertension 12/03/2020   Gastro-esophageal reflux disease without esophagitis 12/03/2020   Hematuria 12/03/2020   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   Morbid obesity 12/03/2020   Osteoarthritis of hip 12/03/2020   Osteoarthritis of knee 12/03/2020   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   Restless legs 12/03/2020   Skin sensation disturbance 12/03/2020   Lumbar stenosis 11/21/2020   Tendinitis 09/03/2020   Subacute arthropathy 09/03/2020   Bilateral hearing loss 08/11/2020   Impacted cerumen of left ear 03/10/2017   Excessive cerumen in left ear canal 03/10/2017   Faintness 12/09/2015   Backache 11/14/2014   CHRONIC PANCREATITIS 06/16/2009   Past Medical History:  Diagnosis Date   Arthritis    all joints   Chronic diastolic CHF (congestive heart failure)    Chronic kidney disease (CKD), stage IV (severe)    Chronic kidney disease, stage 3    Congestive heart failure with LV diastolic dysfunction, NYHA class 2    Family history of adverse reaction to anesthesia    father- change in personaility   GERD (gastroesophageal reflux disease)    Hypertension    Hypertension associated with diabetes    Hypertensive chronic kidney disease    Insulin dependent type 2 diabetes mellitus    Neuropathy    OSA on CPAP    Sleep apnea    uses cpap, pt does not know settings    Family History  Problem Relation Age of Onset   Hypertension Mother    Anesthesia problems Neg Hx    Hypotension Neg Hx    Malignant hyperthermia Neg Hx    Pseudochol deficiency Neg Hx     Past Surgical History:  Procedure Laterality Date   ANTERIOR LAT LUMBAR FUSION Left  01/22/2021   Procedure: Lumbar one-Lumbar two Lateral Lumbar Interbody Fusion;  Surgeon: Bedelia Person, MD;  Location: [facility] OR;  Service: Neurosurgery;  Laterality: Left;   ANTERIOR LAT LUMBAR FUSION  01/22/2021   APPENDECTOMY     BACK SURGERY     4 surg, lower   CHOLECYSTECTOMY     COLONOSCOPY WITH PROPOFOL N/A 06/11/2014   Procedure: COLONOSCOPY WITH PROPOFOL;  Surgeon: Charolett Bumpers, MD;  Location: [facility];  Service: Endoscopy;  Laterality: N/A;   ESOPHAGOGASTRODUODENOSCOPY  08/26/2011   Procedure: ESOPHAGOGASTRODUODENOSCOPY (EGD);  Surgeon: Charolett Bumpers, MD;  Location: [facility];  Service: Endoscopy;  Laterality: N/A;   ESOPHAGOGASTRODUODENOSCOPY (EGD) WITH PROPOFOL N/A 06/11/2014   Procedure: ESOPHAGOGASTRODUODENOSCOPY (EGD) WITH PROPOFOL;  Surgeon: Jone Baseman  Laural Benes, MD;  Location: [facility];  Service: Endoscopy;  Laterality: N/A;   EUS  09/15/2011   Procedure: UPPER ENDOSCOPIC ULTRASOUND (EUS) LINEAR;  Surgeon: Freddy Jaksch, MD;  Location: [facility];  Service: Endoscopy;  Laterality: N/A;  MAC   I & D EXTREMITY Right 05/20/2021   Procedure: IRRIGATION AND DEBRIDEMENT OF LEG AND APPLICATION OF SKIN GRAFT;  Surgeon: Nadara Mustard, MD;  Location: [facility] OR;  Service: Orthopedics;  Laterality: Right;   I & D EXTREMITY Right 07/03/2021   Procedure: REPEAT RIGHT LEG DEBRIDEMENT;  Surgeon: Nadara Mustard, MD;  Location: [facility] OR;  Service: Orthopedics;  Laterality: Right;   I & D EXTREMITY Right 07/01/2021   Procedure: RIGHT LEG DEBRIDEMENT;  Surgeon: Nadara Mustard, MD;  Location: [facility] OR;  Service: Orthopedics;  Laterality: Right;   KNEE ARTHROSCOPY Right YEARS AGO   Social History   Occupational History   Occupation: retired  Tobacco Use   Smoking status: Former    Packs/day: 3.00    Years: 20.00    Additional pack years: 0.00    Total pack years: 60.00    Types: Cigarettes    Quit date: 1984    Years since quitting: 40.3   Smokeless tobacco: Never  Vaping Use    Vaping Use: Never used  Substance and Sexual Activity   Alcohol use: Not Currently   Drug use: Never   Sexual activity: Not on file

## 2022-11-22 DIAGNOSIS — E1140 Type 2 diabetes mellitus with diabetic neuropathy, unspecified: Secondary | ICD-10-CM | POA: Diagnosis not present

## 2022-12-08 DIAGNOSIS — M7061 Trochanteric bursitis, right hip: Secondary | ICD-10-CM | POA: Diagnosis not present

## 2022-12-16 ENCOUNTER — Ambulatory Visit (INDEPENDENT_AMBULATORY_CARE_PROVIDER_SITE_OTHER): Payer: Medicare Other | Admitting: Orthopedic Surgery

## 2022-12-16 DIAGNOSIS — I87331 Chronic venous hypertension (idiopathic) with ulcer and inflammation of right lower extremity: Secondary | ICD-10-CM | POA: Diagnosis not present

## 2022-12-16 DIAGNOSIS — Z945 Skin transplant status: Secondary | ICD-10-CM

## 2022-12-16 DIAGNOSIS — S81801S Unspecified open wound, right lower leg, sequela: Secondary | ICD-10-CM | POA: Diagnosis not present

## 2022-12-20 DIAGNOSIS — E1140 Type 2 diabetes mellitus with diabetic neuropathy, unspecified: Secondary | ICD-10-CM | POA: Diagnosis not present

## 2022-12-20 DIAGNOSIS — M353 Polymyalgia rheumatica: Secondary | ICD-10-CM | POA: Diagnosis not present

## 2022-12-22 DIAGNOSIS — E1122 Type 2 diabetes mellitus with diabetic chronic kidney disease: Secondary | ICD-10-CM | POA: Diagnosis not present

## 2022-12-22 DIAGNOSIS — M10372 Gout due to renal impairment, left ankle and foot: Secondary | ICD-10-CM | POA: Diagnosis not present

## 2022-12-22 DIAGNOSIS — I5040 Unspecified combined systolic (congestive) and diastolic (congestive) heart failure: Secondary | ICD-10-CM | POA: Diagnosis not present

## 2022-12-22 DIAGNOSIS — N184 Chronic kidney disease, stage 4 (severe): Secondary | ICD-10-CM | POA: Diagnosis not present

## 2022-12-22 DIAGNOSIS — I10 Essential (primary) hypertension: Secondary | ICD-10-CM | POA: Diagnosis not present

## 2022-12-27 ENCOUNTER — Encounter: Payer: Self-pay | Admitting: Orthopedic Surgery

## 2022-12-27 NOTE — Progress Notes (Signed)
Office Visit Note   Patient: Robert Dickerson           Date of Birth: Dec 26, 1940           MRN: [id number] Visit Date: 12/16/2022              Requested by: [name], MD [address] [place],  [place] [address] PCP: [name], MD  Chief Complaint  Patient presents with   Right Leg - Wound Check      HPI: Patient is an 82 year old gentleman who is seen in follow-up for venous insufficiency ulcer right lower extremity.  Assessment & Plan: Visit Diagnoses:  1. 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 Leg wound, right, sequela   3. History of skin graft     Plan: Continue with compression and routine wound care.  Follow-Up Instructions: Return in about 2 months (around 02/15/2023).   Ortho Exam  Patient is alert, oriented, no adenopathy, well-dressed, normal affect, normal respiratory effort. Examination the venous ulcer right leg is flat with 100% healthy granulation tissue there is no cellulitis no odor no drainage.  The wound measures 3.5 x 2.5 cm.  Imaging: No results found. No images are attached to the encounter.  Labs: Lab Results  Component Value Date   HGBA1C 8.2 (H) 08/25/2021   HGBA1C 7.6 (H) 05/19/2021   HGBA1C 9.5 (H) 12/23/2020   LABURIC 6.4 03/04/2011   REPTSTATUS 07/06/2021 FINAL 07/01/2021   GRAMSTAIN NO WBC SEEN NO ORGANISMS SEEN  07/01/2021   CULT  07/01/2021    FEW STAPHYLOCOCCUS AUREUS RARE PROTEUS MIRABILIS NO ANAEROBES ISOLATED Performed at [facility], [address] [place], [place] [address]    [facility] STAPHYLOCOCCUS AUREUS 07/01/2021   LABORGA PROTEUS MIRABILIS 07/01/2021     Lab Results  Component Value Date   ALBUMIN 2.4 (L) 08/27/2021   ALBUMIN 2.5 (L) 08/25/2021   ALBUMIN 2.6 (L) 06/05/2021    Lab Results  Component Value Date   MG 2.2 08/25/2021   MG 2.2 05/19/2021   MG 2.0 12/23/2020   No results  found for: "VD25OH"  No results found for: "PREALBUMIN"    Latest Ref Rng & Units 08/28/2021    2:58 AM 08/27/2021    4:26 AM 08/26/2021    6:15 AM  CBC EXTENDED  WBC 4.0 - 10.5 K/uL 7.6  10.2  14.4   RBC 4.22 - 5.81 MIL/uL 3.97  3.98  4.12   Hemoglobin 13.0 - 17.0 g/dL 62.1  30.8  65.7   HCT 39.0 - 52.0 % 33.9  34.4  36.2   Platelets 150 - 400 K/uL 313  329  300      There is no height or weight on file to calculate BMI.  Orders:  No orders of the defined types were placed in this encounter.  No orders of the defined types were placed in this encounter.    Procedures: No procedures performed  Clinical Data: No additional findings.  ROS:  All other systems negative, except as noted in the HPI. Review of Systems  Objective: Vital Signs: There were no vitals taken for this visit.  Specialty Comments:  No specialty comments available.  PMFS History: Patient Active Problem List   Diagnosis Date Noted   Aortic atherosclerosis (HCC) 09/04/2021   Hypercholesterolemia 09/04/2021   Unspecified atrial fibrillation (HCC) 08/25/2021   Acute on chronic diastolic CHF (congestive heart failure) (  HCC) 08/25/2021   Wound of right leg, sequela 07/01/2021   Nail, injury by 06/10/2021   Laceration of leg 05/20/2021   Acute respiratory failure with hypoxia (HCC) 05/18/2021   Chronic combined systolic and diastolic heart failure (HCC) 05/18/2021   Chronic kidney disease (CKD), stage IV (severe) (HCC) 05/18/2021   Hypertension associated with diabetes (HCC) 05/18/2021   Insulin dependent type 2 diabetes mellitus (HCC) 05/18/2021   OSA on CPAP 05/18/2021   Laceration of right lower extremity 05/18/2021   Current chronic use of systemic steroids 01/08/2021   Preoperative cardiovascular examination 01/08/2021   Cellulitis 12/23/2020   Benign neoplasm of duodenum, jejunum, 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   Chronic kidney disease, stage 4 (severe) (HCC)  12/03/2020   Degeneration of lumbar intervertebral disc 12/03/2020   Type 2 diabetes mellitus with stage 4 chronic kidney disease (HCC) 12/03/2020   Diabetic peripheral neuropathy associated with type 2 diabetes mellitus (HCC) 12/03/2020   Diastolic heart failure (HCC) 12/03/2020   Enlarged prostate 12/03/2020   Erectile dysfunction due to arterial insufficiency 12/03/2020   Essential hypertension 12/03/2020   Gastro-esophageal reflux disease without esophagitis 12/03/2020   Hematuria 12/03/2020   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   Morbid obesity (HCC) 12/03/2020   Osteoarthritis of hip 12/03/2020   Osteoarthritis of knee 12/03/2020   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   Restless legs 12/03/2020   Skin sensation disturbance 12/03/2020   Lumbar stenosis 11/21/2020   Tendinitis 09/03/2020   Subacute arthropathy 09/03/2020   Bilateral hearing loss 08/11/2020   Impacted cerumen of left ear 03/10/2017   Excessive cerumen in left ear canal 03/10/2017   Faintness 12/09/2015   Backache 11/14/2014   CHRONIC PANCREATITIS 06/16/2009   Past Medical History:  Diagnosis Date   Arthritis    all joints   Chronic diastolic CHF (congestive heart failure) (HCC)    Chronic kidney disease (CKD), stage IV (severe) (HCC)    Chronic kidney disease, stage 3 (HCC)    Congestive heart failure with LV diastolic dysfunction, NYHA class 2 (HCC)    Family history of adverse reaction to anesthesia    father- change in personaility   GERD (gastroesophageal reflux disease)    Hypertension    Hypertension associated with diabetes (HCC)    Hypertensive chronic kidney disease    Insulin dependent type 2 diabetes mellitus (HCC)    Neuropathy    OSA on CPAP    Sleep apnea    uses cpap, pt does not know settings    Family History  Problem Relation Age of Onset   Hypertension Mother    Anesthesia problems Neg Hx    Hypotension Neg Hx    Malignant hyperthermia Neg Hx     Pseudochol deficiency Neg Hx     Past Surgical History:  Procedure Laterality Date   ANTERIOR LAT LUMBAR FUSION Left 01/22/2021   Procedure: Lumbar one-Lumbar two Lateral Lumbar Interbody Fusion;  Surgeon: Bedelia Person, MD;  Location: [facility] OR;  Service: Neurosurgery;  Laterality: Left;   ANTERIOR LAT LUMBAR FUSION  01/22/2021   APPENDECTOMY     BACK SURGERY     4 surg, lower   CHOLECYSTECTOMY     COLONOSCOPY WITH PROPOFOL N/A 06/11/2014   Procedure: COLONOSCOPY WITH PROPOFOL;  Surgeon: Charolett Bumpers, MD;  Location: [facility];  Service: Endoscopy;  Laterality: N/A;   ESOPHAGOGASTRODUODENOSCOPY  08/26/2011   Procedure: ESOPHAGOGASTRODUODENOSCOPY (EGD);  Surgeon: Charolett Bumpers, MD;  Location: [facility];  Service: Endoscopy;  Laterality: N/A;   ESOPHAGOGASTRODUODENOSCOPY (EGD) WITH PROPOFOL N/A 06/11/2014   Procedure: ESOPHAGOGASTRODUODENOSCOPY (EGD) WITH PROPOFOL;  Surgeon: Charolett Bumpers, MD;  Location: [facility];  Service: Endoscopy;  Laterality: N/A;   EUS  09/15/2011   Procedure: UPPER ENDOSCOPIC ULTRASOUND (EUS) LINEAR;  Surgeon: Freddy Jaksch, MD;  Location: [facility];  Service: Endoscopy;  Laterality: N/A;  MAC   I & D EXTREMITY Right 05/20/2021   Procedure: IRRIGATION AND DEBRIDEMENT OF LEG AND APPLICATION OF SKIN GRAFT;  Surgeon: Nadara Mustard, MD;  Location: [facility] OR;  Service: Orthopedics;  Laterality: Right;   I & D EXTREMITY Right 07/03/2021   Procedure: REPEAT RIGHT LEG DEBRIDEMENT;  Surgeon: Nadara Mustard, MD;  Location: [facility] OR;  Service: Orthopedics;  Laterality: Right;   I & D EXTREMITY Right 07/01/2021   Procedure: RIGHT LEG DEBRIDEMENT;  Surgeon: Nadara Mustard, MD;  Location: [facility] OR;  Service: Orthopedics;  Laterality: Right;   KNEE ARTHROSCOPY Right YEARS AGO   Social History   Occupational History   Occupation: retired  Tobacco Use   Smoking status: Former    Packs/day: 3.00    Years: 20.00    Additional pack years: 0.00    Total pack years:  60.00    Types: Cigarettes    Quit date: 1984    Years since quitting: 40.4   Smokeless tobacco: Never  Vaping Use   Vaping Use: Never used  Substance and Sexual Activity   Alcohol use: Not Currently   Drug use: Never   Sexual activity: Not on file

## 2022-12-31 ENCOUNTER — Encounter: Payer: Self-pay | Admitting: Podiatry

## 2022-12-31 ENCOUNTER — Ambulatory Visit (INDEPENDENT_AMBULATORY_CARE_PROVIDER_SITE_OTHER): Payer: Medicare Other | Admitting: Podiatry

## 2022-12-31 DIAGNOSIS — M79674 Pain in right toe(s): Secondary | ICD-10-CM | POA: Diagnosis not present

## 2022-12-31 DIAGNOSIS — R234 Changes in skin texture: Secondary | ICD-10-CM | POA: Insufficient documentation

## 2022-12-31 DIAGNOSIS — B351 Tinea unguium: Secondary | ICD-10-CM | POA: Diagnosis not present

## 2022-12-31 DIAGNOSIS — E1142 Type 2 diabetes mellitus with diabetic polyneuropathy: Secondary | ICD-10-CM

## 2022-12-31 DIAGNOSIS — M79675 Pain in left toe(s): Secondary | ICD-10-CM

## 2022-12-31 NOTE — Progress Notes (Signed)
This patient returns to the office for evaluation and treatment of long thick painful nails .  This patient is unable to trim his own nails since the patient cannot reach his feet.  Patient says the nails are painful walking and wearing his shoes. Patient has history of back surgery. Patient was in [facility] in October 2022.    He returns for preventive foot care services.   General Appearance  Alert, conversant and in no acute stress.  Vascular  Dorsalis pedis and posterior tibial  pulses are palpable  bilaterally.  Capillary return is within normal limits  bilaterally. Temperature is within normal limits  bilaterally.  Neurologic  Senn-Weinstein monofilament wire test diminished  bilaterally. Muscle power within normal limits bilaterally.  Nails Thick disfigured discolored nails with subungual debris  from hallux to second  toes bilaterally. No evidence of bacterial infection or drainage bilaterally.  Orthopedic  No limitations of motion  feet .  No crepitus or effusions noted.  No bony pathology or digital deformities noted. Prominent metatarsal heads  B/L.  Skin  Dry scaly  skin with no porokeratosis noted bilaterally.  No signs of infections or ulcers noted.     Onychomycosis  Pain in toes right foot  Pain in toes left foot   Fissure left heel.  Debridement  of nails  1-5  B/L with a nail nipper.  Nails were then filed using a dremel tool with no incidents. Upon debridement of nails, I noted dried blood on his left heel.  Examination of heel reveals fissures to both sides of left heel.  Cleaned heel with alchol and bandaged the open fissure with neosporin/DSD.  Padding added to shoes  B/L.  RTC 3 months    Helane Gunther DPM

## 2023-01-04 ENCOUNTER — Telehealth: Payer: Self-pay | Admitting: Orthopedic Surgery

## 2023-01-04 ENCOUNTER — Telehealth: Payer: Self-pay

## 2023-01-04 NOTE — Telephone Encounter (Signed)
Pt calling because oint that was sent into pharm says only gave him 2 tubes in stead of  and he would like this to be sent in

## 2023-01-05 ENCOUNTER — Telehealth: Payer: Self-pay | Admitting: Orthopedic Surgery

## 2023-01-05 MED ORDER — CLOBETASOL PROPIONATE 0.05 % EX CREA: 1.0000 | TOPICAL_CREAM | Freq: Two times a day (BID) | CUTANEOUS | 8 refills | Status: DC

## 2023-01-05 NOTE — Telephone Encounter (Signed)
Sent earlier today

## 2023-01-05 NOTE — Addendum Note (Signed)
Addended by: Barnie Del R on: 01/05/2023 10:31 AM   Modules accepted: Orders

## 2023-01-05 NOTE — Telephone Encounter (Signed)
Patient called in again about  refill on Clobetasol Cream refill needs 8 tubes sent to [facility] in [place] on file

## 2023-01-18 DIAGNOSIS — K219 Gastro-esophageal reflux disease without esophagitis: Secondary | ICD-10-CM | POA: Diagnosis not present

## 2023-01-18 DIAGNOSIS — N400 Enlarged prostate without lower urinary tract symptoms: Secondary | ICD-10-CM | POA: Diagnosis not present

## 2023-01-18 DIAGNOSIS — I4891 Unspecified atrial fibrillation: Secondary | ICD-10-CM | POA: Diagnosis not present

## 2023-01-18 DIAGNOSIS — G894 Chronic pain syndrome: Secondary | ICD-10-CM | POA: Diagnosis not present

## 2023-01-18 DIAGNOSIS — E1122 Type 2 diabetes mellitus with diabetic chronic kidney disease: Secondary | ICD-10-CM | POA: Diagnosis not present

## 2023-01-18 DIAGNOSIS — M10372 Gout due to renal impairment, left ankle and foot: Secondary | ICD-10-CM | POA: Diagnosis not present

## 2023-01-18 DIAGNOSIS — N184 Chronic kidney disease, stage 4 (severe): Secondary | ICD-10-CM | POA: Diagnosis not present

## 2023-01-18 DIAGNOSIS — I10 Essential (primary) hypertension: Secondary | ICD-10-CM | POA: Diagnosis not present

## 2023-01-18 DIAGNOSIS — M179 Osteoarthritis of knee, unspecified: Secondary | ICD-10-CM | POA: Diagnosis not present

## 2023-01-22 DIAGNOSIS — S80812A Abrasion, left lower leg, initial encounter: Secondary | ICD-10-CM | POA: Diagnosis not present

## 2023-01-22 DIAGNOSIS — L03116 Cellulitis of left lower limb: Secondary | ICD-10-CM | POA: Diagnosis not present

## 2023-01-24 DIAGNOSIS — R809 Proteinuria, unspecified: Secondary | ICD-10-CM | POA: Diagnosis not present

## 2023-01-24 DIAGNOSIS — L299 Pruritus, unspecified: Secondary | ICD-10-CM | POA: Diagnosis not present

## 2023-01-24 DIAGNOSIS — I5020 Unspecified systolic (congestive) heart failure: Secondary | ICD-10-CM | POA: Diagnosis not present

## 2023-01-24 DIAGNOSIS — N2581 Secondary hyperparathyroidism of renal origin: Secondary | ICD-10-CM | POA: Diagnosis not present

## 2023-01-24 DIAGNOSIS — N184 Chronic kidney disease, stage 4 (severe): Secondary | ICD-10-CM | POA: Diagnosis not present

## 2023-01-24 DIAGNOSIS — L97919 Non-pressure chronic ulcer of unspecified part of right lower leg with unspecified severity: Secondary | ICD-10-CM | POA: Diagnosis not present

## 2023-02-09 DIAGNOSIS — L0390 Cellulitis, unspecified: Secondary | ICD-10-CM | POA: Diagnosis not present

## 2023-02-09 DIAGNOSIS — S81802A Unspecified open wound, left lower leg, initial encounter: Secondary | ICD-10-CM | POA: Diagnosis not present

## 2023-02-17 ENCOUNTER — Other Ambulatory Visit: Payer: Self-pay

## 2023-02-17 ENCOUNTER — Ambulatory Visit (INDEPENDENT_AMBULATORY_CARE_PROVIDER_SITE_OTHER): Payer: Medicare Other | Admitting: Orthopedic Surgery

## 2023-02-17 ENCOUNTER — Encounter: Payer: Self-pay | Admitting: Orthopedic Surgery

## 2023-02-17 DIAGNOSIS — I87331 Chronic venous hypertension (idiopathic) with ulcer and inflammation of right lower extremity: Secondary | ICD-10-CM | POA: Diagnosis not present

## 2023-02-17 DIAGNOSIS — S81801S Unspecified open wound, right lower leg, sequela: Secondary | ICD-10-CM

## 2023-02-17 NOTE — Progress Notes (Signed)
Office Visit Note   Patient: Robert Dickerson           Date of Birth: [birth year]           MRN: [id number] Visit Date: 02/17/2023              Requested by: [name], MD [address] [place],  [place] [address] PCP: [name], MD  Chief Complaint  Patient presents with   Right Leg - Follow-up      HPI: Patient is an 82 year old gentleman who presents in follow-up for chronic venous ulcer medial right calf.  Patient states he also recently sustained a traumatic ulcer lateral left calf from bumping his leg on a trailer hitch.  Assessment & Plan: Visit Diagnoses:  1. 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  We will make a consult with vascular vein surgery to see if he is a candidate for vein ablation.  Follow-Up Instructions: Return in about 4 weeks (around 03/17/2023).   Ortho Exam  Patient is alert, oriented, no adenopathy, well-dressed, normal affect, normal respiratory effort. Examination patient has a new traumatic wound on the lateral left calf.  There is a thin eschar and swelling but no cellulitis no drainage.  Examination of the medial right calf ulcer shows the wound to be 20 x 25 mm the wound is flat with no cellulitis no drainage.  Imaging: No results found. No images are attached to the encounter.  Labs: Lab Results  Component Value Date   HGBA1C 8.2 (H) 08/25/2021   HGBA1C 7.6 (H) 05/19/2021   HGBA1C 9.5 (H) 12/23/2020   LABURIC 6.4 03/04/2011   REPTSTATUS 07/06/2021 FINAL 07/01/2021   GRAMSTAIN NO WBC SEEN NO ORGANISMS SEEN  07/01/2021   CULT  07/01/2021    FEW STAPHYLOCOCCUS AUREUS RARE PROTEUS MIRABILIS NO ANAEROBES ISOLATED Performed at [facility], [address] [place], [place] [address]    [facility] STAPHYLOCOCCUS AUREUS 07/01/2021   LABORGA  PROTEUS MIRABILIS 07/01/2021     Lab Results  Component Value Date   ALBUMIN 2.4 (L) 08/27/2021   ALBUMIN 2.5 (L) 08/25/2021   ALBUMIN 2.6 (L) 06/05/2021    Lab Results  Component Value Date   MG 2.2 08/25/2021   MG 2.2 05/19/2021   MG 2.0 12/23/2020   No results found for: "VD25OH"  No results found for: "PREALBUMIN"    Latest Ref Rng & Units 08/28/2021    2:58 AM 08/27/2021    4:26 AM 08/26/2021    6:15 AM  CBC EXTENDED  WBC 4.0 - 10.5 K/uL 7.6  10.2  14.4   RBC 4.22 - 5.81 MIL/uL 3.97  3.98  4.12   Hemoglobin 13.0 - 17.0 g/dL 86.5  78.4  69.6   HCT 39.0 - 52.0 % 33.9  34.4  36.2   Platelets 150 - 400 K/uL 313  329  300      There is no height or weight on file to calculate BMI.  Orders:  No orders of the defined types were placed in this encounter.  No orders of the defined types were placed in this encounter.    Procedures: No procedures performed  Clinical Data: No additional findings.  ROS:  All other systems negative, except as noted in the HPI. Review of Systems  Objective: Vital Signs: There were no vitals  taken for this visit.  Specialty Comments:  No specialty comments available.  PMFS History: Patient Active Problem List   Diagnosis Date Noted   Skin fissures 12/31/2022   Aortic atherosclerosis (HCC) 09/04/2021   Hypercholesterolemia 09/04/2021   Unspecified atrial fibrillation (HCC) 08/25/2021   Acute on chronic diastolic CHF (congestive heart failure) (HCC) 08/25/2021   Wound of right leg, sequela 07/01/2021   Nail, injury by 06/10/2021   Laceration of leg 05/20/2021   Acute respiratory failure with hypoxia (HCC) 05/18/2021   Chronic combined systolic and diastolic heart failure (HCC) 05/18/2021   Chronic kidney disease (CKD), stage IV (severe) (HCC) 05/18/2021   Hypertension associated with diabetes (HCC) 05/18/2021   Insulin dependent type 2 diabetes mellitus (HCC) 05/18/2021   OSA on CPAP 05/18/2021   Laceration of right lower  extremity 05/18/2021   Current chronic use of systemic steroids 01/08/2021   Preoperative cardiovascular examination 01/08/2021   Cellulitis 12/23/2020   Benign neoplasm of duodenum, jejunum, 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   Chronic kidney disease, stage 4 (severe) (HCC) 12/03/2020   Degeneration of lumbar intervertebral disc 12/03/2020   Type 2 diabetes mellitus with stage 4 chronic kidney disease (HCC) 12/03/2020   Diabetic peripheral neuropathy associated with type 2 diabetes mellitus (HCC) 12/03/2020   Diastolic heart failure (HCC) 12/03/2020   Enlarged prostate 12/03/2020   Erectile dysfunction due to arterial insufficiency 12/03/2020   Essential hypertension 12/03/2020   Gastro-esophageal reflux disease without esophagitis 12/03/2020   Hematuria 12/03/2020   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   Morbid obesity (HCC) 12/03/2020   Osteoarthritis of hip 12/03/2020   Osteoarthritis of knee 12/03/2020   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   Restless legs 12/03/2020   Skin sensation disturbance 12/03/2020   Lumbar stenosis 11/21/2020   Tendinitis 09/03/2020   Subacute arthropathy 09/03/2020   Bilateral hearing loss 08/11/2020   Impacted cerumen of left ear 03/10/2017   Excessive cerumen in left ear canal 03/10/2017   Faintness 12/09/2015   Backache 11/14/2014   CHRONIC PANCREATITIS 06/16/2009   Past Medical History:  Diagnosis Date   Arthritis    all joints   Chronic diastolic CHF (congestive heart failure) (HCC)    Chronic kidney disease (CKD), stage IV (severe) (HCC)    Chronic kidney disease, stage 3 (HCC)    Congestive heart failure with LV diastolic dysfunction, NYHA class 2 (HCC)    Family history of adverse reaction to anesthesia    father- change in personaility   GERD (gastroesophageal reflux disease)    Hypertension    Hypertension associated with diabetes (HCC)    Hypertensive chronic kidney disease     Insulin dependent type 2 diabetes mellitus (HCC)    Neuropathy    OSA on CPAP    Sleep apnea    uses cpap, pt does not know settings    Family History  Problem Relation Age of Onset   Hypertension Mother    Anesthesia problems Neg Hx    Hypotension Neg Hx    Malignant hyperthermia Neg Hx    Pseudochol deficiency Neg Hx     Past Surgical History:  Procedure Laterality Date   ANTERIOR LAT LUMBAR FUSION Left 01/22/2021   Procedure: Lumbar one-Lumbar two Lateral Lumbar Interbody Fusion;  Surgeon: Bedelia Person, MD;  Location: [facility] OR;  Service: Neurosurgery;  Laterality: Left;   ANTERIOR LAT LUMBAR FUSION  01/22/2021   APPENDECTOMY     BACK SURGERY     4 surg,  lower   CHOLECYSTECTOMY     COLONOSCOPY WITH PROPOFOL N/A 06/11/2014   Procedure: COLONOSCOPY WITH PROPOFOL;  Surgeon: Charolett Bumpers, MD;  Location: [facility];  Service: Endoscopy;  Laterality: N/A;   ESOPHAGOGASTRODUODENOSCOPY  08/26/2011   Procedure: ESOPHAGOGASTRODUODENOSCOPY (EGD);  Surgeon: Charolett Bumpers, MD;  Location: [facility];  Service: Endoscopy;  Laterality: N/A;   ESOPHAGOGASTRODUODENOSCOPY (EGD) WITH PROPOFOL N/A 06/11/2014   Procedure: ESOPHAGOGASTRODUODENOSCOPY (EGD) WITH PROPOFOL;  Surgeon: Charolett Bumpers, MD;  Location: [facility];  Service: Endoscopy;  Laterality: N/A;   EUS  09/15/2011   Procedure: UPPER ENDOSCOPIC ULTRASOUND (EUS) LINEAR;  Surgeon: Freddy Jaksch, MD;  Location: [facility];  Service: Endoscopy;  Laterality: N/A;  MAC   I & D EXTREMITY Right 05/20/2021   Procedure: IRRIGATION AND DEBRIDEMENT OF LEG AND APPLICATION OF SKIN GRAFT;  Surgeon: Nadara Mustard, MD;  Location: [facility] OR;  Service: Orthopedics;  Laterality: Right;   I & D EXTREMITY Right 07/03/2021   Procedure: REPEAT RIGHT LEG DEBRIDEMENT;  Surgeon: Nadara Mustard, MD;  Location: [facility] OR;  Service: Orthopedics;  Laterality: Right;   I & D EXTREMITY Right 07/01/2021   Procedure: RIGHT LEG DEBRIDEMENT;  Surgeon: Nadara Mustard, MD;  Location: [facility] OR;  Service: Orthopedics;  Laterality: Right;   KNEE ARTHROSCOPY Right YEARS AGO   Social History   Occupational History   Occupation: retired  Tobacco Use   Smoking status: Former    Current packs/day: 0.00    Average packs/day: 3.0 packs/day for 20.0 years (60.0 ttl pk-yrs)    Types: Cigarettes    Start date: 1964    Quit date: 1984    Years since quitting: 40.5   Smokeless tobacco: Never  Vaping Use   Vaping status: Never Used  Substance and Sexual Activity   Alcohol use: Not Currently   Drug use: Never   Sexual activity: Not on file

## 2023-02-21 DIAGNOSIS — E1140 Type 2 diabetes mellitus with diabetic neuropathy, unspecified: Secondary | ICD-10-CM | POA: Diagnosis not present

## 2023-02-21 DIAGNOSIS — S81802A Unspecified open wound, left lower leg, initial encounter: Secondary | ICD-10-CM | POA: Diagnosis not present

## 2023-02-21 DIAGNOSIS — I129 Hypertensive chronic kidney disease with stage 1 through stage 4 chronic kidney disease, or unspecified chronic kidney disease: Secondary | ICD-10-CM | POA: Diagnosis not present

## 2023-02-21 DIAGNOSIS — E0859 Diabetes mellitus due to underlying condition with other circulatory complications: Secondary | ICD-10-CM | POA: Diagnosis not present

## 2023-02-21 DIAGNOSIS — N184 Chronic kidney disease, stage 4 (severe): Secondary | ICD-10-CM | POA: Diagnosis not present

## 2023-03-11 ENCOUNTER — Other Ambulatory Visit: Payer: Self-pay | Admitting: Neurosurgery

## 2023-03-11 DIAGNOSIS — M5416 Radiculopathy, lumbar region: Secondary | ICD-10-CM | POA: Diagnosis not present

## 2023-03-11 DIAGNOSIS — Z6828 Body mass index (BMI) 28.0-28.9, adult: Secondary | ICD-10-CM | POA: Diagnosis not present

## 2023-03-11 DIAGNOSIS — M5136 Other intervertebral disc degeneration, lumbar region: Secondary | ICD-10-CM | POA: Diagnosis not present

## 2023-03-11 DIAGNOSIS — Z981 Arthrodesis status: Secondary | ICD-10-CM | POA: Diagnosis not present

## 2023-03-17 ENCOUNTER — Ambulatory Visit: Payer: Medicare Other | Admitting: Orthopedic Surgery

## 2023-03-24 ENCOUNTER — Ambulatory Visit (INDEPENDENT_AMBULATORY_CARE_PROVIDER_SITE_OTHER): Payer: Medicare Other | Admitting: Orthopedic Surgery

## 2023-03-24 ENCOUNTER — Encounter: Payer: Self-pay | Admitting: Orthopedic Surgery

## 2023-03-24 DIAGNOSIS — I87331 Chronic venous hypertension (idiopathic) with ulcer and inflammation of right lower extremity: Secondary | ICD-10-CM

## 2023-03-24 NOTE — Progress Notes (Signed)
Office Visit Note   Patient: Robert Dickerson           Date of Birth: [birth year]           MRN: [id number] Visit Date: 03/24/2023              Requested by: [name], MD [address] [place],  [place] [address] PCP: [name], MD  Chief Complaint  Patient presents with   Right Leg - Follow-up      HPI: Patient is a 82 year old gentleman with venous stasis insufficiency and chronic ulcer right lower extremity.  Assessment & Plan: Visit Diagnoses:  1. 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  He will continue with routine wound care and compression.  Follow-Up Instructions: Return in about 4 weeks (around 04/21/2023).   Ortho Exam  Patient is alert, oriented, no adenopathy, well-dressed, normal affect, normal respiratory effort. Examination patient's chronic venous ulcer medial aspect of the right calf is showing slow steady improvement.  It currently measures 15 x 20 mm and 1 mm deep.  After debridement there is healthy bleeding granulation tissue that is flat no tunneling no cellulitis.  Imaging: No results found.   Labs: Lab Results  Component Value Date   HGBA1C 8.2 (H) 08/25/2021   HGBA1C 7.6 (H) 05/19/2021   HGBA1C 9.5 (H) 12/23/2020   LABURIC 6.4 03/04/2011   REPTSTATUS 07/06/2021 FINAL 07/01/2021   GRAMSTAIN NO WBC SEEN NO ORGANISMS SEEN  07/01/2021   CULT  07/01/2021    FEW STAPHYLOCOCCUS AUREUS RARE PROTEUS MIRABILIS NO ANAEROBES ISOLATED Performed at [facility], [address] [place], [place] [address]    [facility] STAPHYLOCOCCUS AUREUS 07/01/2021   LABORGA PROTEUS MIRABILIS 07/01/2021     Lab Results  Component Value Date   ALBUMIN 2.4 (L) 08/27/2021   ALBUMIN 2.5 (L) 08/25/2021   ALBUMIN 2.6 (L) 06/05/2021    Lab  Results  Component Value Date   MG 2.2 08/25/2021   MG 2.2 05/19/2021   MG 2.0 12/23/2020   No results found for: "VD25OH"  No results found for: "PREALBUMIN"    Latest Ref Rng & Units 08/28/2021    2:58 AM 08/27/2021    4:26 AM 08/26/2021    6:15 AM  CBC EXTENDED  WBC 4.0 - 10.5 K/uL 7.6  10.2  14.4   RBC 4.22 - 5.81 MIL/uL 3.97  3.98  4.12   Hemoglobin 13.0 - 17.0 g/dL 95.2  84.1  32.4   HCT 39.0 - 52.0 % 33.9  34.4  36.2   Platelets 150 - 400 K/uL 313  329  300      There is no height or weight on file to calculate BMI.  Orders:  No orders of the defined types were placed in this encounter.  No orders of the defined types were placed in this encounter.    Procedures: No procedures performed  Clinical Data: No additional findings.  ROS:  All other systems negative, except as noted in the HPI. Review of Systems  Objective: Vital Signs: There were no vitals taken for this visit.  Specialty Comments:  No specialty comments available.  PMFS History: Patient Active Problem List   Diagnosis Date Noted   Skin fissures 12/31/2022   Aortic atherosclerosis (HCC) 09/04/2021  Hypercholesterolemia 09/04/2021   Unspecified atrial fibrillation (HCC) 08/25/2021   Acute on chronic diastolic CHF (congestive heart failure) (HCC) 08/25/2021   Wound of right leg, sequela 07/01/2021   Nail, injury by 06/10/2021   Laceration of leg 05/20/2021   Acute respiratory failure with hypoxia (HCC) 05/18/2021   Chronic combined systolic and diastolic heart failure (HCC) 05/18/2021   Chronic kidney disease (CKD), stage IV (severe) (HCC) 05/18/2021   Hypertension associated with diabetes (HCC) 05/18/2021   Insulin dependent type 2 diabetes mellitus (HCC) 05/18/2021   OSA on CPAP 05/18/2021   Laceration of right lower extremity 05/18/2021   Current chronic use of systemic steroids 01/08/2021   Preoperative cardiovascular examination 01/08/2021   Cellulitis 12/23/2020   Benign  neoplasm of duodenum, jejunum, 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   Chronic kidney disease, stage 4 (severe) (HCC) 12/03/2020   Degeneration of lumbar intervertebral disc 12/03/2020   Type 2 diabetes mellitus with stage 4 chronic kidney disease (HCC) 12/03/2020   Diabetic peripheral neuropathy associated with type 2 diabetes mellitus (HCC) 12/03/2020   Diastolic heart failure (HCC) 12/03/2020   Enlarged prostate 12/03/2020   Erectile dysfunction due to arterial insufficiency 12/03/2020   Essential hypertension 12/03/2020   Gastro-esophageal reflux disease without esophagitis 12/03/2020   Hematuria 12/03/2020   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   Morbid obesity (HCC) 12/03/2020   Osteoarthritis of hip 12/03/2020   Osteoarthritis of knee 12/03/2020   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   Restless legs 12/03/2020   Skin sensation disturbance 12/03/2020   Lumbar stenosis 11/21/2020   Tendinitis 09/03/2020   Subacute arthropathy 09/03/2020   Bilateral hearing loss 08/11/2020   Impacted cerumen of left ear 03/10/2017   Excessive cerumen in left ear canal 03/10/2017   Faintness 12/09/2015   Backache 11/14/2014   CHRONIC PANCREATITIS 06/16/2009   Past Medical History:  Diagnosis Date   Arthritis    all joints   Chronic diastolic CHF (congestive heart failure) (HCC)    Chronic kidney disease (CKD), stage IV (severe) (HCC)    Chronic kidney disease, stage 3 (HCC)    Congestive heart failure with LV diastolic dysfunction, NYHA class 2 (HCC)    Family history of adverse reaction to anesthesia    father- change in personaility   GERD (gastroesophageal reflux disease)    Hypertension    Hypertension associated with diabetes (HCC)    Hypertensive chronic kidney disease    Insulin dependent type 2 diabetes mellitus (HCC)    Neuropathy    OSA on CPAP    Sleep apnea    uses cpap, pt does not know settings    Family History  Problem  Relation Age of Onset   Hypertension Mother    Anesthesia problems Neg Hx    Hypotension Neg Hx    Malignant hyperthermia Neg Hx    Pseudochol deficiency Neg Hx     Past Surgical History:  Procedure Laterality Date   ANTERIOR LAT LUMBAR FUSION Left 01/22/2021   Procedure: Lumbar one-Lumbar two Lateral Lumbar Interbody Fusion;  Surgeon: Bedelia Person, MD;  Location: [facility] OR;  Service: Neurosurgery;  Laterality: Left;   ANTERIOR LAT LUMBAR FUSION  01/22/2021   APPENDECTOMY     BACK SURGERY     4 surg, lower   CHOLECYSTECTOMY     COLONOSCOPY WITH PROPOFOL N/A 06/11/2014   Procedure: COLONOSCOPY WITH PROPOFOL;  Surgeon: Charolett Bumpers, MD;  Location: [facility];  Service: Endoscopy;  Laterality: N/A;  ESOPHAGOGASTRODUODENOSCOPY  08/26/2011   Procedure: ESOPHAGOGASTRODUODENOSCOPY (EGD);  Surgeon: Charolett Bumpers, MD;  Location: [facility];  Service: Endoscopy;  Laterality: N/A;   ESOPHAGOGASTRODUODENOSCOPY (EGD) WITH PROPOFOL N/A 06/11/2014   Procedure: ESOPHAGOGASTRODUODENOSCOPY (EGD) WITH PROPOFOL;  Surgeon: Charolett Bumpers, MD;  Location: [facility];  Service: Endoscopy;  Laterality: N/A;   EUS  09/15/2011   Procedure: UPPER ENDOSCOPIC ULTRASOUND (EUS) LINEAR;  Surgeon: Freddy Jaksch, MD;  Location: [facility];  Service: Endoscopy;  Laterality: N/A;  MAC   I & D EXTREMITY Right 05/20/2021   Procedure: IRRIGATION AND DEBRIDEMENT OF LEG AND APPLICATION OF SKIN GRAFT;  Surgeon: Nadara Mustard, MD;  Location: [facility] OR;  Service: Orthopedics;  Laterality: Right;   I & D EXTREMITY Right 07/03/2021   Procedure: REPEAT RIGHT LEG DEBRIDEMENT;  Surgeon: Nadara Mustard, MD;  Location: [facility] OR;  Service: Orthopedics;  Laterality: Right;   I & D EXTREMITY Right 07/01/2021   Procedure: RIGHT LEG DEBRIDEMENT;  Surgeon: Nadara Mustard, MD;  Location: [facility] OR;  Service: Orthopedics;  Laterality: Right;   KNEE ARTHROSCOPY Right YEARS AGO   Social History   Occupational History   Occupation:  retired  Tobacco Use   Smoking status: Former    Current packs/day: 0.00    Average packs/day: 3.0 packs/day for 20.0 years (60.0 ttl pk-yrs)    Types: Cigarettes    Start date: 1964    Quit date: 1984    Years since quitting: 40.6   Smokeless tobacco: Never  Vaping Use   Vaping status: Never Used  Substance and Sexual Activity   Alcohol use: Not Currently   Drug use: Never   Sexual activity: Not on file

## 2023-03-28 DIAGNOSIS — L03116 Cellulitis of left lower limb: Secondary | ICD-10-CM | POA: Diagnosis not present

## 2023-03-28 DIAGNOSIS — M79605 Pain in left leg: Secondary | ICD-10-CM | POA: Diagnosis not present

## 2023-03-29 ENCOUNTER — Other Ambulatory Visit: Payer: Self-pay | Admitting: Family

## 2023-03-30 ENCOUNTER — Other Ambulatory Visit: Payer: Self-pay

## 2023-03-30 DIAGNOSIS — I130 Hypertensive heart and chronic kidney disease with heart failure and stage 1 through stage 4 chronic kidney disease, or unspecified chronic kidney disease: Secondary | ICD-10-CM | POA: Diagnosis not present

## 2023-03-30 DIAGNOSIS — E1151 Type 2 diabetes mellitus with diabetic peripheral angiopathy without gangrene: Secondary | ICD-10-CM | POA: Diagnosis not present

## 2023-03-30 DIAGNOSIS — E876 Hypokalemia: Secondary | ICD-10-CM | POA: Diagnosis not present

## 2023-03-30 DIAGNOSIS — L97519 Non-pressure chronic ulcer of other part of right foot with unspecified severity: Secondary | ICD-10-CM | POA: Diagnosis not present

## 2023-03-30 DIAGNOSIS — L97219 Non-pressure chronic ulcer of right calf with unspecified severity: Secondary | ICD-10-CM | POA: Diagnosis not present

## 2023-03-30 DIAGNOSIS — G4733 Obstructive sleep apnea (adult) (pediatric): Secondary | ICD-10-CM | POA: Diagnosis not present

## 2023-03-30 DIAGNOSIS — L98499 Non-pressure chronic ulcer of skin of other sites with unspecified severity: Secondary | ICD-10-CM | POA: Diagnosis not present

## 2023-03-30 DIAGNOSIS — M1A9XX0 Chronic gout, unspecified, without tophus (tophi): Secondary | ICD-10-CM | POA: Diagnosis not present

## 2023-03-30 DIAGNOSIS — Z6834 Body mass index (BMI) 34.0-34.9, adult: Secondary | ICD-10-CM | POA: Diagnosis not present

## 2023-03-30 DIAGNOSIS — L03116 Cellulitis of left lower limb: Secondary | ICD-10-CM | POA: Diagnosis not present

## 2023-03-30 DIAGNOSIS — I509 Heart failure, unspecified: Secondary | ICD-10-CM | POA: Diagnosis not present

## 2023-03-30 DIAGNOSIS — N184 Chronic kidney disease, stage 4 (severe): Secondary | ICD-10-CM | POA: Diagnosis not present

## 2023-03-30 DIAGNOSIS — E1140 Type 2 diabetes mellitus with diabetic neuropathy, unspecified: Secondary | ICD-10-CM | POA: Diagnosis not present

## 2023-03-30 DIAGNOSIS — Z66 Do not resuscitate: Secondary | ICD-10-CM | POA: Diagnosis not present

## 2023-03-30 DIAGNOSIS — Z7952 Long term (current) use of systemic steroids: Secondary | ICD-10-CM | POA: Diagnosis not present

## 2023-03-30 DIAGNOSIS — E1142 Type 2 diabetes mellitus with diabetic polyneuropathy: Secondary | ICD-10-CM | POA: Diagnosis not present

## 2023-03-30 DIAGNOSIS — Z888 Allergy status to other drugs, medicaments and biological substances status: Secondary | ICD-10-CM | POA: Diagnosis not present

## 2023-03-30 DIAGNOSIS — Z79899 Other long term (current) drug therapy: Secondary | ICD-10-CM | POA: Diagnosis not present

## 2023-03-30 DIAGNOSIS — E1122 Type 2 diabetes mellitus with diabetic chronic kidney disease: Secondary | ICD-10-CM | POA: Diagnosis not present

## 2023-03-30 DIAGNOSIS — E11621 Type 2 diabetes mellitus with foot ulcer: Secondary | ICD-10-CM | POA: Diagnosis not present

## 2023-03-30 DIAGNOSIS — E11622 Type 2 diabetes mellitus with other skin ulcer: Secondary | ICD-10-CM | POA: Diagnosis present

## 2023-03-30 DIAGNOSIS — Z794 Long term (current) use of insulin: Secondary | ICD-10-CM | POA: Diagnosis not present

## 2023-03-30 DIAGNOSIS — Z87891 Personal history of nicotine dependence: Secondary | ICD-10-CM | POA: Diagnosis not present

## 2023-03-30 DIAGNOSIS — I700 Atherosclerosis of aorta: Secondary | ICD-10-CM | POA: Diagnosis not present

## 2023-03-30 DIAGNOSIS — I5042 Chronic combined systolic (congestive) and diastolic (congestive) heart failure: Secondary | ICD-10-CM | POA: Diagnosis not present

## 2023-03-30 DIAGNOSIS — Z885 Allergy status to narcotic agent status: Secondary | ICD-10-CM | POA: Diagnosis not present

## 2023-04-06 ENCOUNTER — Ambulatory Visit (INDEPENDENT_AMBULATORY_CARE_PROVIDER_SITE_OTHER): Payer: Medicare Other | Admitting: Podiatry

## 2023-04-06 ENCOUNTER — Encounter: Payer: Self-pay | Admitting: Podiatry

## 2023-04-06 DIAGNOSIS — M79675 Pain in left toe(s): Secondary | ICD-10-CM | POA: Diagnosis not present

## 2023-04-06 DIAGNOSIS — M79674 Pain in right toe(s): Secondary | ICD-10-CM | POA: Diagnosis not present

## 2023-04-06 DIAGNOSIS — B351 Tinea unguium: Secondary | ICD-10-CM

## 2023-04-06 DIAGNOSIS — E1142 Type 2 diabetes mellitus with diabetic polyneuropathy: Secondary | ICD-10-CM

## 2023-04-06 DIAGNOSIS — I129 Hypertensive chronic kidney disease with stage 1 through stage 4 chronic kidney disease, or unspecified chronic kidney disease: Secondary | ICD-10-CM | POA: Diagnosis not present

## 2023-04-06 DIAGNOSIS — S81802A Unspecified open wound, left lower leg, initial encounter: Secondary | ICD-10-CM | POA: Diagnosis not present

## 2023-04-06 DIAGNOSIS — E1140 Type 2 diabetes mellitus with diabetic neuropathy, unspecified: Secondary | ICD-10-CM | POA: Diagnosis not present

## 2023-04-06 DIAGNOSIS — N184 Chronic kidney disease, stage 4 (severe): Secondary | ICD-10-CM | POA: Diagnosis not present

## 2023-04-06 DIAGNOSIS — M79605 Pain in left leg: Secondary | ICD-10-CM | POA: Diagnosis not present

## 2023-04-06 DIAGNOSIS — E0859 Diabetes mellitus due to underlying condition with other circulatory complications: Secondary | ICD-10-CM | POA: Diagnosis not present

## 2023-04-06 NOTE — Progress Notes (Signed)
This patient returns to the office for evaluation and treatment of long thick painful nails .  This patient is unable to trim his own nails since the patient cannot reach his feet.  Patient says the nails are painful walking and wearing his shoes. Patient has history of back surgery. Patient was in [facility] in October 2022.   He was hospitalized with cellulitis.  He returns for preventive foot care services.   General Appearance  Alert, conversant and in no acute stress.  Vascular  Dorsalis pedis and posterior tibial  pulses are palpable  bilaterally.  Capillary return is within normal limits  bilaterally. Temperature is within normal limits  bilaterally.  Neurologic  Senn-Weinstein monofilament wire test diminished  bilaterally. Muscle power within normal limits bilaterally.  Nails Thick disfigured discolored nails with subungual debris  from hallux to second  toes bilaterally. No evidence of bacterial infection or drainage bilaterally.  Orthopedic  No limitations of motion  feet .  No crepitus or effusions noted.  No bony pathology or digital deformities noted. Prominent metatarsal heads  B/L.  Skin  Dry scaly  skin with no porokeratosis noted bilaterally.  No signs of infections or ulcers noted.     Onychomycosis  Pain in toes right foot  Pain in toes left foot   Fissure left heel.  Debridement  of nails  1-5  B/L with a nail nipper.  Nails were then filed using a dremel tool with no incidents upon debridement of nails. No evidence of infection feet  B/L. RTC 3 months    Helane Gunther DPM

## 2023-04-08 ENCOUNTER — Ambulatory Visit
Admission: RE | Admit: 2023-04-08 | Discharge: 2023-04-08 | Disposition: A | Discharge status: Home / Self Care | Payer: Medicare Other | Source: Ambulatory Visit | Attending: Neurosurgery

## 2023-04-08 DIAGNOSIS — M5416 Radiculopathy, lumbar region: Secondary | ICD-10-CM | POA: Diagnosis not present

## 2023-04-08 DIAGNOSIS — Z981 Arthrodesis status: Secondary | ICD-10-CM | POA: Diagnosis not present

## 2023-04-14 ENCOUNTER — Telehealth: Payer: Self-pay | Admitting: Cardiovascular Disease

## 2023-04-14 NOTE — Telephone Encounter (Signed)
Pt c/o Shortness Of Breath: STAT if SOB developed within the last 24 hours or pt is noticeably SOB on the phone  1. Are you currently SOB (can you hear that pt is SOB on the phone)? No   2. How long have you been experiencing SOB? A month or two   3. Are you SOB when sitting or when up moving around? Moving around and sitting   4. Are you currently experiencing any other symptoms? Diarrhea

## 2023-04-14 NOTE — Telephone Encounter (Signed)
I saw he has had a lot of labs performed recently - his kidney function had worsened, but was improving. Have they been holding his diuretics? How much does he weigh? Can he please have BMET and BNP tomorrow, so we have results ready when he comes in Monday.

## 2023-04-14 NOTE — Telephone Encounter (Signed)
Spoke with wife per DPR and patient has been experiencing SOB with exertion. Denies any chest pain or recent swelling. States he has had cellulitis recently. States it is getting worse. He does have appointment with PCP tomorrow and you on Monday. Discussed ED precautions

## 2023-04-15 ENCOUNTER — Ambulatory Visit (HOSPITAL_COMMUNITY)
Admission: RE | Admit: 2023-04-15 | Discharge: 2023-04-15 | Disposition: A | Discharge status: Home / Self Care | Payer: Medicare Other | Source: Ambulatory Visit | Attending: Vascular Surgery

## 2023-04-15 ENCOUNTER — Ambulatory Visit (INDEPENDENT_AMBULATORY_CARE_PROVIDER_SITE_OTHER): Payer: Medicare Other | Admitting: Physician Assistant

## 2023-04-15 VITALS — BP 116/65 | HR 73 | Temp 97.9°F | Resp 22 | Ht 69.0 in | Wt 216.0 lb

## 2023-04-15 DIAGNOSIS — S81802A Unspecified open wound, left lower leg, initial encounter: Secondary | ICD-10-CM | POA: Diagnosis not present

## 2023-04-15 DIAGNOSIS — L98499 Non-pressure chronic ulcer of skin of other sites with unspecified severity: Secondary | ICD-10-CM | POA: Diagnosis present

## 2023-04-15 DIAGNOSIS — R0602 Shortness of breath: Secondary | ICD-10-CM | POA: Diagnosis not present

## 2023-04-15 DIAGNOSIS — G4733 Obstructive sleep apnea (adult) (pediatric): Secondary | ICD-10-CM | POA: Diagnosis not present

## 2023-04-15 DIAGNOSIS — I739 Peripheral vascular disease, unspecified: Secondary | ICD-10-CM | POA: Diagnosis not present

## 2023-04-15 DIAGNOSIS — N184 Chronic kidney disease, stage 4 (severe): Secondary | ICD-10-CM | POA: Diagnosis not present

## 2023-04-15 DIAGNOSIS — E1140 Type 2 diabetes mellitus with diabetic neuropathy, unspecified: Secondary | ICD-10-CM | POA: Diagnosis not present

## 2023-04-15 DIAGNOSIS — Z6825 Body mass index (BMI) 25.0-25.9, adult: Secondary | ICD-10-CM | POA: Diagnosis not present

## 2023-04-15 DIAGNOSIS — E0859 Diabetes mellitus due to underlying condition with other circulatory complications: Secondary | ICD-10-CM | POA: Diagnosis not present

## 2023-04-15 DIAGNOSIS — M79605 Pain in left leg: Secondary | ICD-10-CM | POA: Diagnosis not present

## 2023-04-15 DIAGNOSIS — I129 Hypertensive chronic kidney disease with stage 1 through stage 4 chronic kidney disease, or unspecified chronic kidney disease: Secondary | ICD-10-CM | POA: Diagnosis not present

## 2023-04-15 DIAGNOSIS — M5136 Other intervertebral disc degeneration, lumbar region: Secondary | ICD-10-CM | POA: Diagnosis not present

## 2023-04-15 DIAGNOSIS — I5030 Unspecified diastolic (congestive) heart failure: Secondary | ICD-10-CM | POA: Diagnosis not present

## 2023-04-15 LAB — BASIC METABOLIC PANEL: EGFR: 13
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
## 2023-04-16 DIAGNOSIS — S80212A Abrasion, left knee, initial encounter: Secondary | ICD-10-CM | POA: Diagnosis not present

## 2023-04-16 DIAGNOSIS — S0990XA Unspecified injury of head, initial encounter: Secondary | ICD-10-CM | POA: Diagnosis not present

## 2023-04-16 DIAGNOSIS — E1122 Type 2 diabetes mellitus with diabetic chronic kidney disease: Secondary | ICD-10-CM | POA: Diagnosis not present

## 2023-04-16 DIAGNOSIS — S299XXA Unspecified injury of thorax, initial encounter: Secondary | ICD-10-CM | POA: Diagnosis not present

## 2023-04-16 DIAGNOSIS — N184 Chronic kidney disease, stage 4 (severe): Secondary | ICD-10-CM | POA: Diagnosis not present

## 2023-04-16 DIAGNOSIS — E669 Obesity, unspecified: Secondary | ICD-10-CM | POA: Diagnosis not present

## 2023-04-16 DIAGNOSIS — E1142 Type 2 diabetes mellitus with diabetic polyneuropathy: Secondary | ICD-10-CM | POA: Diagnosis not present

## 2023-04-16 DIAGNOSIS — I130 Hypertensive heart and chronic kidney disease with heart failure and stage 1 through stage 4 chronic kidney disease, or unspecified chronic kidney disease: Secondary | ICD-10-CM | POA: Diagnosis not present

## 2023-04-16 DIAGNOSIS — S80211A Abrasion, right knee, initial encounter: Secondary | ICD-10-CM | POA: Diagnosis not present

## 2023-04-16 DIAGNOSIS — S0081XA Abrasion of other part of head, initial encounter: Secondary | ICD-10-CM | POA: Diagnosis not present

## 2023-04-16 DIAGNOSIS — I509 Heart failure, unspecified: Secondary | ICD-10-CM | POA: Diagnosis not present

## 2023-04-16 DIAGNOSIS — Z794 Long term (current) use of insulin: Secondary | ICD-10-CM | POA: Diagnosis not present

## 2023-04-16 DIAGNOSIS — G4733 Obstructive sleep apnea (adult) (pediatric): Secondary | ICD-10-CM | POA: Diagnosis not present

## 2023-04-16 DIAGNOSIS — Z043 Encounter for examination and observation following other accident: Secondary | ICD-10-CM | POA: Diagnosis not present

## 2023-04-17 DIAGNOSIS — S80212A Abrasion, left knee, initial encounter: Secondary | ICD-10-CM | POA: Diagnosis not present

## 2023-04-17 DIAGNOSIS — W19XXXA Unspecified fall, initial encounter: Secondary | ICD-10-CM | POA: Diagnosis not present

## 2023-04-17 DIAGNOSIS — S80211A Abrasion, right knee, initial encounter: Secondary | ICD-10-CM | POA: Diagnosis not present

## 2023-04-18 ENCOUNTER — Encounter: Payer: Self-pay | Admitting: Cardiovascular Disease

## 2023-04-18 ENCOUNTER — Ambulatory Visit: Payer: Medicare Other | Attending: Cardiovascular Disease | Admitting: Cardiovascular Disease

## 2023-04-18 ENCOUNTER — Other Ambulatory Visit: Payer: Self-pay

## 2023-04-18 VITALS — BP 111/52 | HR 63 | Ht 69.0 in | Wt 217.6 lb

## 2023-04-18 DIAGNOSIS — N179 Acute kidney failure, unspecified: Secondary | ICD-10-CM | POA: Diagnosis present

## 2023-04-18 DIAGNOSIS — E1122 Type 2 diabetes mellitus with diabetic chronic kidney disease: Secondary | ICD-10-CM

## 2023-04-18 DIAGNOSIS — L03116 Cellulitis of left lower limb: Secondary | ICD-10-CM | POA: Diagnosis not present

## 2023-04-18 DIAGNOSIS — I5030 Unspecified diastolic (congestive) heart failure: Secondary | ICD-10-CM

## 2023-04-18 DIAGNOSIS — E782 Mixed hyperlipidemia: Secondary | ICD-10-CM

## 2023-04-18 DIAGNOSIS — I5042 Chronic combined systolic (congestive) and diastolic (congestive) heart failure: Secondary | ICD-10-CM

## 2023-04-18 DIAGNOSIS — I700 Atherosclerosis of aorta: Secondary | ICD-10-CM

## 2023-04-18 DIAGNOSIS — I10 Essential (primary) hypertension: Secondary | ICD-10-CM

## 2023-04-18 DIAGNOSIS — I872 Venous insufficiency (chronic) (peripheral): Secondary | ICD-10-CM

## 2023-04-18 DIAGNOSIS — I739 Peripheral vascular disease, unspecified: Secondary | ICD-10-CM

## 2023-04-18 DIAGNOSIS — Z7952 Long term (current) use of systemic steroids: Secondary | ICD-10-CM

## 2023-04-18 DIAGNOSIS — N184 Chronic kidney disease, stage 4 (severe): Secondary | ICD-10-CM

## 2023-04-18 MED ORDER — FUROSEMIDE 80 MG PO TABS: 80.0000 mg | ORAL_TABLET | Freq: Every day | ORAL | Status: DC

## 2023-04-18 NOTE — Patient Instructions (Signed)
Medication Instructions:  TAKE FUROSEMIDE (LASIX) 80 MG A DAY STOP TAKING HYDRLAZINE *If you need a refill on your cardiac medications before your next appointment, please call your pharmacy*   Lab Work: BMP, CRP- today If you have labs (blood work) drawn today and your tests are completely normal, you will receive your results only by: MyChart Message (if you have MyChart) OR A paper copy in the mail If you have any lab test that is abnormal or we need to change your treatment, we will call you to review the results.    Follow-Up: At [facility], you and your health needs are our priority.  As part of our continuing mission to provide you with exceptional heart care, we have created designated Provider Care Teams.  These Care Teams include your primary Cardiologist (physician) and Advanced Practice Providers (APPs -  Physician Assistants and Nurse Practitioners) who all work together to provide you with the care you need, when you need it.  We recommend signing up for the patient portal called "MyChart".  Sign up information is provided on this After Visit Summary.  MyChart is used to connect with patients for Virtual Visits (Telemedicine).  Patients are able to view lab/test results, encounter notes, upcoming appointments, etc.  Non-urgent messages can be sent to your provider as well.   To learn more about what you can do with MyChart, go to [email or web address].    Your next appointment:   4-5 month(s)  Provider:   Thurmon Fair, MD

## 2023-04-18 NOTE — Progress Notes (Unsigned)
Cardiology Office Note:    Date:  04/19/2023   ID:  JIBREEL LORIG, DOB 08/07/1940, MRN [id number]  PCP:  [name], MD   [facility] Providers Cardiologist:  Thurmon Fair, MD     Referring MD: [name], *   Chief Complaint  Patient presents with   Shortness of Breath    History of Present Illness:    Robert Dickerson is a 82 y.o. male with a hx of heart failure with reduced ejection fraction, type 2 diabetes mellitus on insulin, essential hypertension, CKD stage IV, history of gout, aortic atherosclerosis on imaging studies, history of lumbar spine stenosis.  He has had several new recent health challenges.  He still has a right pretibial ulcer and has been seeing Dr. Lajoyce Corners, this appears to be healing (previous ABIs did not show evidence of severe PAD that would impair healing).  He had a venous duplex ultrasound that showed no evidence of DVT but did confirm reflux at the right saphenofemoral femoral junction.  He then developed left leg cellulitis and required antibiotic therapy, initially intravenous vancomycin, followed by oral doxycycline.  He has had poor appetite and dysgeusia, possibly due to the antibiotic.  During that time he had worsening renal function: On 08/30 his creatinine was up to 2.7, on 09/04 creatinine was 3.7, labs reordered today that came back after he left the office showed further worsening with a creatinine of 4.59 and a markedly elevated BUN of 152.  The potassium was borderline low at 3.3.  He still complains of shortness of breath with activity, but he does not have any edema, orthopnea or PND.  A BNP that was checked 04/15/2023 was very normal at 69.  We had previously evaluated his "dry weight" at around 220 pounds and today he weighs 217.6 pounds.  Because of his renal dysfunction he was told to temporarily cut back his dose of furosemide to once daily, but as of this weekend he was felt to go back to his usual 80 mg twice daily  dose.  He has already taken 80 mg of furosemide today.  He had a fall on 04/16/2023 walking out of his garage, he hit both knees on the cement, but then fell sideways onto the grass where he hit his head and chest.  He skinned his knees and has an abrasion on his forehead and has left 5th-7th rib fractures.  CT of the head did not show intracranial injury.  He does not think that he was dizzy before the fall.  He did not lose consciousness.  He has tenderness in his anterior left chest and winces a little bit with deep breaths but he has not had angina or true pleurisy.    He does not have known CAD or PAD, but does have evidence of aortic atherosclerosis on imaging studies and mildly low ABIs (November 2022 R ABI 0.87 R TBI 0.47, L ABI 0.97, L TBI 0.60).  We have avoided all contrast based procedures due to his renal dysfunction.  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  His EF is down to 35% with a pattern of global hypokinesis.  He has never had angina pectoris.  We have deferred coronary angiography since he is firmly opposed to ever go on hemodialysis and has advanced chronic kidney disease stage IV.  Back in 2016 he underwent a nuclear stress test with normal perfusion; at that time ejection fraction was normal at 57%.  Diabetes control is fair with a  hemoglobin A1c of 7.3% in July.  His lipid parameters are all good with an LDL of 56 and HDL of 46.    He was diagnosed with congestive heart failure in 2015 when his echocardiogram showed the EF 50-55% and "grade 1 diastolic dysfunction".  Frequent PACs were noted on that report.  A nuclear stress test performed in April 2016 did not show perfusion abnormalities.  He has never had cardiac catheterization.  Echo 08/25/2021 showed EF had decreased to 35%, but we avoided cardiac catheterization due to renal dysfunction.  He states he will never want to go on hemodialysis.  His nephrologist is Dr. Sabra Heck at [facility].  Past Medical History:  Diagnosis Date   Arthritis    all joints   Chronic diastolic CHF (congestive heart failure) (HCC)    Chronic kidney disease (CKD), stage IV (severe) (HCC)    Chronic kidney disease, stage 3 (HCC)    Congestive heart failure with LV diastolic dysfunction, NYHA class 2 (HCC)    Family history of adverse reaction to anesthesia    father- change in personaility   GERD (gastroesophageal reflux disease)    Hypertension    Hypertension associated with diabetes (HCC)    Hypertensive chronic kidney disease    Insulin dependent type 2 diabetes mellitus (HCC)    Neuropathy    OSA on CPAP    Sleep apnea    uses cpap, pt does not know settings    Past Surgical History:  Procedure Laterality Date   ANTERIOR LAT LUMBAR FUSION Left 01/22/2021   Procedure: Lumbar one-Lumbar two Lateral Lumbar Interbody Fusion;  Surgeon: Bedelia Person, MD;  Location: [facility] OR;  Service: Neurosurgery;  Laterality: Left;   ANTERIOR LAT LUMBAR FUSION  01/22/2021   APPENDECTOMY     BACK SURGERY     4 surg, lower   CHOLECYSTECTOMY     COLONOSCOPY WITH PROPOFOL N/A 06/11/2014   Procedure: COLONOSCOPY WITH PROPOFOL;  Surgeon: Charolett Bumpers, MD;  Location: [facility] END[facility]COPY;  Service: Endoscopy;  Laterality: N/A;   ESOPHAGOGASTRODUODENOSCOPY  08/26/2011   Procedure: ESOPHAGOGASTRODUODENOSCOPY (EGD);  Surgeon: Charolett Bumpers, MD;  Location: [facility];  Service: Endoscopy;  Laterality: N/A;   ESOPHAGOGASTRODUODENOSCOPY (EGD) WITH PROPOFOL N/A 06/11/2014   Procedure: ESOPHAGOGASTRODUODENOSCOPY (EGD) WITH PROPOFOL;  Surgeon: Charolett Bumpers, MD;  Location: [facility];  Service: Endoscopy;  Laterality: N/A;   EUS  09/15/2011   Procedure: UPPER ENDOSCOPIC ULTRASOUND (EUS) LINEAR;  Surgeon: Freddy Jaksch, MD;  Location: [facility];  Service: Endoscopy;  Laterality: N/A;  MAC   I & D EXTREMITY Right 05/20/2021   Procedure: IRRIGATION AND DEBRIDEMENT OF LEG AND APPLICATION OF SKIN  GRAFT;  Surgeon: Nadara Mustard, MD;  Location: [facility] OR;  Service: Orthopedics;  Laterality: Right;   I & D EXTREMITY Right 07/03/2021   Procedure: REPEAT RIGHT LEG DEBRIDEMENT;  Surgeon: Nadara Mustard, MD;  Location: [facility] OR;  Service: Orthopedics;  Laterality: Right;   I & D EXTREMITY Right 07/01/2021   Procedure: RIGHT LEG DEBRIDEMENT;  Surgeon: Nadara Mustard, MD;  Location: [facility] OR;  Service: Orthopedics;  Laterality: Right;   KNEE ARTHROSCOPY Right YEARS AGO    Current Medications: Current Meds  Medication Sig   ACCU-CHEK GUIDE test strip daily before breakfast.   Accu-Chek Softclix Lancets lancets daily before breakfast.   acetaminophen (TYLENOL) 325 MG tablet Take 325 mg by mouth every 6 (six) hours as needed.   allopurinol (ZYLOPRIM) 300 MG tablet  Take 300 mg by mouth daily.   blood glucose meter kit and supplies KIT Dispense based on patient and insurance preference. Use up to four times daily as directed. (Patient taking differently: daily before breakfast.)   blood glucose meter kit and supplies by Other route as directed. Check blood sugar every morning   cetirizine (ZYRTEC) 10 MG tablet Take 10 mg by mouth 2 (two) times daily. Patient takes half tablet twice a day   clobetasol cream (TEMOVATE) 0.05 % APPLY 1 APPLICATION TOPICALLY TWICE DAILY   econazole nitrate 1 % cream Apply 1 Application topically as needed.   gabapentin (NEURONTIN) 300 MG capsule Take 300 mg by mouth 2 (two) times daily.   insulin lispro (HUMALOG) 100 UNIT/ML injection Inject 3 Units into the skin 3 (three) times daily with meals.   Insulin Pen Needle (PENTIPS) 32G X 6 MM MISC    isosorbide dinitrate (ISORDIL) 10 MG tablet TAKE 1 TABLET BY MOUTH 3 TIMES  DAILY   LANTUS SOLOSTAR 100 UNIT/ML Solostar Pen Inject 20 Units into the skin in the morning.   metolazone (ZAROXOLYN) 5 MG tablet Take 5 mg by mouth daily. Patient states not taking   metoprolol tartrate (LOPRESSOR) 25 MG tablet TAKE 1 TABLET BY MOUTH TWICE   DAILY   mupirocin ointment (BACTROBAN) 2 % Apply 1 Application topically as needed.   NON FORMULARY CPAP  at bedtime   omeprazole (PRILOSEC) 20 MG capsule Take 1 capsule by mouth daily.   oxyCODONE (OXY IR/ROXICODONE) 5 MG immediate release tablet Take 5 mg by mouth.   potassium chloride SA (KLOR-CON M) 20 MEQ tablet Take 20 mEq by mouth daily.   predniSONE (DELTASONE) 1 MG tablet Take 4 mg by mouth daily.   [DISCONTINUED] furosemide (LASIX) 80 MG tablet Take 1 tablet (80 mg total) by mouth daily. (Patient taking differently: Take 80 mg by mouth 2 (two) times daily.)   [DISCONTINUED] hydrALAZINE (APRESOLINE) 10 MG tablet TAKE 1 TABLET BY MOUTH EVERY 8  HOURS     Allergies:   Oxycodone hcl, Ace inhibitors, Lisinopril, and Oxycodone   Social History   Socioeconomic History   Marital status: Married    Spouse name: Cordelia Pen   Number of children: Not on file   Years of education: Not on file   Highest education level: Not on file  Occupational History   Occupation: retired  Tobacco Use   Smoking status: Former    Current packs/day: 0.00    Average packs/day: 3.0 packs/day for 20.0 years (60.0 ttl pk-yrs)    Types: Cigarettes    Start date: 1964    Quit date: 1984    Years since quitting: 40.7   Smokeless tobacco: Never  Vaping Use   Vaping status: Never Used  Substance and Sexual Activity   Alcohol use: Not Currently   Drug use: Never   Sexual activity: Not on file  Other Topics Concern   Not on file  Social History Narrative   ** Merged History Encounter **       Social Determinants of Health   Financial Resource Strain: Low Risk  (08/27/2021)   Overall Financial Resource Strain (CARDIA)    Difficulty of Paying Living Expenses: Not hard at all  Food Insecurity: No Food Insecurity (08/27/2021)   Hunger Vital Sign    Worried About Running Out of Food in the Last Year: Never true    Ran Out of Food in the Last Year: Never true  Transportation Needs: No Transportation Needs  (  08/27/2021)   PRAPARE - Administrator, Civil Service (Medical): No    Lack of Transportation (Non-Medical): No  Physical Activity: Not on file  Stress: Not on file  Social Connections: Not on file     Family History: The patient's family history includes Hypertension in his mother. There is no history of Anesthesia problems, Hypotension, Malignant hyperthermia, or Pseudochol deficiency.  ROS:   Please see the history of present illness.     All other systems reviewed and are negative.  EKGs/Labs/Other Studies Reviewed:    The following studies were reviewed today:  Nuclear stress test 11/22/2014  Impression Exercise Capacity:  Fair exercise capacity. BP Response:  Normal blood pressure response. Clinical Symptoms:  No chest pain. ECG Impression:  No significant ST segment change suggestive of ischemia. Comparison with Prior Nuclear Study: No previous nuclear study performed   Overall Impression:  Normal stress nuclear study. No EKG changes of ischemia. No chest pain.   LV Ejection Fraction: 57%.  LV Wall Motion:  NL LV Function; NL Wall Motion    ECHO 09/04/2013  - Left ventricle: The cavity size was normal. Wall thickness    was normal. Systolic function was low normal to mildly    reduced. The estimated ejection fraction was in the range    of 50% to 55%. Wall motion was normal; there were no    regional wall motion abnormalities. Doppler parameters are    consistent with abnormal left ventricular relaxation    (grade 1 diastolic dysfunction).  - Aortic valve: There was no stenosis. Trivial    regurgitation.  - Aorta: Mildly dilated aortic root.  - Mitral valve: Mildly calcified annulus. Mildly calcified    leaflets . Trivial regurgitation.  - Left atrium: The atrium was mildly dilated.  - Right ventricle: The cavity size was normal. Systolic    function was normal.  - Pulmonary arteries: No complete TR doppler jet so unable    to estimate PA systolic  pressure.  Echocardiogram 08/25/2021   1. Left ventricular ejection fraction, by estimation, is 30 to 35%. The  left ventricle has moderately decreased function. The left ventricle  demonstrates global hypokinesis. The left ventricular internal cavity size  was mildly dilated. There is mild  concentric left ventricular hypertrophy. Left ventricular diastolic  parameters are indeterminate.   2. Right ventricular systolic function is normal. The right ventricular  size is normal.   3. The mitral valve is grossly normal. No evidence of mitral valve  regurgitation.   4. The aortic valve was not well visualized. Aortic valve regurgitation  is not visualized. No aortic stenosis is present.   5. The inferior vena cava is dilated in size with <50% respiratory  variability, suggesting right atrial pressure of 15 mmHg.  2016 Nuclear scan  Overall Impression:  Normal stress nuclear study. No EKG changes of ischemia. No chest pain.    LV Ejection Fraction: 57%.  LV Wall Motion:  NL LV Function; NL Wall Motion     EKG:  EKG is ordered today.  It shows sinus rhythm and first-degree AV block (PR interval 284 ms) and nonspecific T wave inversion in leads I and aVL, QTc 394 ms..  It is not meaningfully changed from previous tracings.  Recent Labs: 04/18/2023: BUN 152; Creatinine, Ser 4.59; Potassium 3.3; Sodium 137  Recent Lipid Panel    Component Value Date/Time   CHOL 149 08/26/2021 0615   TRIG 76 08/26/2021 0615   HDL 64 08/26/2021  0615   CHOLHDL 2.3 08/26/2021 0615   VLDL 15 08/26/2021 0615   LDLCALC 70 08/26/2021 0615    Risk Assessment/Calculations:       Physical Exam:    VS:  BP (!) 111/52   Pulse 63   Ht 5\' 9"  (1.753 m)   Wt 217 lb 9.6 oz (98.7 kg)   SpO2 96%   BMI 32.13 kg/m     Wt Readings from Last 3 Encounters:  04/18/23 217 lb 9.6 oz (98.7 kg)  04/15/23 216 lb (98 kg)  10/15/22 237 lb (107.5 kg)      General: Alert, oriented x3, no distress, mildly  obese Head: no evidence of trauma, PERRL, EOMI, no exophtalmos or lid lag, no myxedema, no xanthelasma; normal ears, nose and oropharynx Neck: normal jugular venous pulsations and no hepatojugular reflux; brisk carotid pulses without delay and no carotid bruits Chest: clear to auscultation, no signs of consolidation by percussion or palpation, normal fremitus, symmetrical and full respiratory excursions Cardiovascular: normal position and quality of the apical impulse, regular rhythm, normal first and second heart sounds, no murmurs, rubs or gallops Abdomen: no tenderness or distention, no masses by palpation, no abnormal pulsatility or arterial bruits, normal bowel sounds, no hepatosplenomegaly Extremities: He has a roughly 4 cm superficial ulceration in the right pretibial area.  His left lower extremity is slightly more erythematous, but it is a dusky red and it is not warm or tender.  He does not have any edema Neurological: grossly nonfocal Psych: Normal mood and affect    ASSESSMENT:    1. Diastolic heart failure, unspecified HF chronicity (HCC)   2. Cellulitis of left lower extremity   3. Chronic combined systolic and diastolic heart failure (HCC)   4. Acute renal failure superimposed on stage 4 chronic kidney disease, unspecified acute renal failure type (HCC)   5. Type 2 diabetes mellitus with stage 4 chronic kidney disease, without long-term current use of insulin (HCC)   6. Mixed hyperlipidemia   7. Essential hypertension   8. Current chronic use of systemic steroids   9. PAD (peripheral artery disease) (HCC)   10. Peripheral venous insufficiency   11. Aortic atherosclerosis (HCC)       PLAN:    In order of problems listed above:  CHF:   Although he still complains of exertional dyspnea, he does not have any findings to suggest hypervolemia.  The jugular veins were normal, he has no lower extremity edema, his weight is lower than her previous estimated dry weight of 220  pounds, his BMP a few days ago was completely normal.  I suspect that he is actually  hypovolemic, may be due to the doxycycline -related reduction in oral intake.  He is also borderline hypotensive.  Stop hydralazine at least for the time being. Acute on CKD4: Renal function parameters are substantially worse than his baseline.  I think this is due to hypovolemia.  He did get some vancomycin as well during his recent hospitalization.  I called him and told him to stop his furosemide completely until his follow-up visit in the nephrology clinic. DM: Adequate glycemic control.  Complicated by nephropathy and neuropathy. HLP: LDL cholesterol at target on the current statin dose.  Minimal elevation in triglycerides does not want more medication. HTN: Blood pressure is rather low.  Avoid hypotension with his renal dysfunction.  Stop hydralazine (used for HF). Chronic steroid therapy: He has been on chronic steroid therapy for about 3 years now.  Reminded  him about the risk of iatrogenic adrenal insufficiency and the fact that he will probably need stress doses of hydrocortisone during critical illness. PAD: Minor based on noninvasive testing. Peripheral venous insufficiency: He does have evidence of significant reflux in the right lower extremity at the greater saphenous-femoral junction, this may be delaying healing of his post traumatic right pretibial wound. Aortic atherosclerosis: He has evidence of coronary artery calcification and aortic atherosclerosis on imaging studies as well as mildly abnormal ABI.  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  He does not want to pursue coronary angiography due to his renal dysfunction and is from decision to never go on dialysis. PACs: None are seen or heard on exam today, but in the past they have been acute frequent and mimic atrial fibrillation, but true atrial fibrillation has not been confirmed.  He is not on anticoagulation and I do not think that is  indicated at this time.         Medication Adjustments/Labs and Tests Ordered: Current medicines are reviewed at length with the patient today.  Concerns regarding medicines are outlined above.  Orders Placed This Encounter  Procedures   Basic metabolic panel   C-reactive protein   EKG 12-Lead   Meds ordered this encounter  Medications   furosemide (LASIX) 80 MG tablet    Sig: Take 1 tablet (80 mg total) by mouth daily.     Patient Instructions  Medication Instructions:  TAKE FUROSEMIDE (LASIX) 80 MG A DAY STOP TAKING HYDRLAZINE *If you need a refill on your cardiac medications before your next appointment, please call your pharmacy*   Lab Work: BMP, CRP- today If you have labs (blood work) drawn today and your tests are completely normal, you will receive your results only by: MyChart Message (if you have MyChart) OR A paper copy in the mail If you have any lab test that is abnormal or we need to change your treatment, we will call you to review the results.    Follow-Up: At [facility], you and your health needs are our priority.  As part of our continuing mission to provide you with exceptional heart care, we have created designated Provider Care Teams.  These Care Teams include your primary Cardiologist (physician) and Advanced Practice Providers (APPs -  Physician Assistants and Nurse Practitioners) who all work together to provide you with the care you need, when you need it.  We recommend signing up for the patient portal called "MyChart".  Sign up information is provided on this After Visit Summary.  MyChart is used to connect with patients for Virtual Visits (Telemedicine).  Patients are able to view lab/test results, encounter notes, upcoming appointments, etc.  Non-urgent messages can be sent to your provider as well.   To learn more about what you can do with MyChart, go to [email or web address].    Your next appointment:   4-5  month(s)  Provider:   Thurmon Fair, MD       Signed, Thurmon Fair, MD  04/19/2023 11:24 AM    [facility] '

## 2023-04-19 ENCOUNTER — Telehealth: Payer: Self-pay | Admitting: Emergency Medicine

## 2023-04-19 ENCOUNTER — Encounter: Payer: Self-pay | Admitting: Cardiovascular Disease

## 2023-04-19 LAB — BASIC METABOLIC PANEL
BUN/Creatinine Ratio: 33 — ABNORMAL HIGH (ref 10–24)
BUN: 152 mg/dL (ref 8–27)
CO2: 21 mmol/L (ref 20–29)
Calcium: 8.4 mg/dL — ABNORMAL LOW (ref 8.6–10.2)
Chloride: 94 mmol/L — ABNORMAL LOW (ref 96–106)
Creatinine, Ser: 4.59 mg/dL — ABNORMAL HIGH (ref 0.76–1.27)
Glucose: 188 mg/dL — ABNORMAL HIGH (ref 70–99)
Potassium: 3.3 mmol/L — ABNORMAL LOW (ref 3.5–5.2)
Sodium: 137 mmol/L (ref 134–144)
eGFR: 12 mL/min/{1.73_m2} — ABNORMAL LOW (ref >59)

## 2023-04-19 LAB — C-REACTIVE PROTEIN: CRP: 4 mg/L (ref 0–10)

## 2023-04-19 NOTE — Telephone Encounter (Signed)
  The renal parameters are still worsening. Please stop the furosemide altogether until the follow up with Dr. Marisue Humble later this week. The BNP was quite low. The shortness of breath is not due to fluid overload/heart failure. The CRP is now normal - no sign of ongoing inflammation/infection.    Left the information above (results) over voicemail with call back number if he has any questions.

## 2023-04-21 ENCOUNTER — Ambulatory Visit (INDEPENDENT_AMBULATORY_CARE_PROVIDER_SITE_OTHER): Payer: Medicare Other | Admitting: Orthopedic Surgery

## 2023-04-21 ENCOUNTER — Encounter: Payer: Self-pay | Admitting: Orthopedic Surgery

## 2023-04-21 DIAGNOSIS — I87331 Chronic venous hypertension (idiopathic) with ulcer and inflammation of right lower extremity: Secondary | ICD-10-CM

## 2023-04-21 DIAGNOSIS — I739 Peripheral vascular disease, unspecified: Secondary | ICD-10-CM | POA: Diagnosis not present

## 2023-04-21 DIAGNOSIS — S81801S Unspecified open wound, right lower leg, sequela: Secondary | ICD-10-CM

## 2023-04-21 NOTE — Progress Notes (Signed)
Office Visit Note   Patient: Robert Dickerson           Date of Birth: November 19, 1940           MRN: [id number] Visit Date: 04/21/2023              Requested by: [name], MD [address] [place],  [place] [address] PCP: [name], MD  Chief Complaint  Patient presents with   Right Leg - Follow-up      HPI: Patient is an 82 year old gentleman who presents for follow-up for venous and lymphatic chronic ulcer medial aspect right calf.  Patient patient states he was told he did not need vascular intervention for the right lower extremity.  Assessment & Plan: Visit Diagnoses:  1. 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 Leg wound, right, sequela   3. PVD (peripheral vascular disease) (HCC)     Plan: Patient would like to follow-up as needed.  He will continue with compression and daily dressing changes.  Follow-Up Instructions: No follow-ups on file.   Ortho Exam  Patient is alert, oriented, no adenopathy, well-dressed, normal affect, normal respiratory effort. Examination the chronic venous and lymphatic insufficiency ulcer medial right calf is stable it measures 2 x 3 cm it is flat with 100% healthy granulation tissue no cellulitis no dermatitis no drainage.  Imaging: No results found. No images are attached to the encounter.  Labs: Lab Results  Component Value Date   HGBA1C 8.2 (H) 08/25/2021   HGBA1C 7.6 (H) 05/19/2021   HGBA1C 9.5 (H) 12/23/2020   CRP 4 04/18/2023   LABURIC 6.4 03/04/2011   REPTSTATUS 07/06/2021 FINAL 07/01/2021   GRAMSTAIN NO WBC SEEN NO ORGANISMS SEEN  07/01/2021   CULT  07/01/2021    FEW STAPHYLOCOCCUS AUREUS RARE PROTEUS MIRABILIS NO ANAEROBES ISOLATED Performed at [facility], [address] [place], [place] [address]    [facility] STAPHYLOCOCCUS AUREUS 07/01/2021   LABORGA PROTEUS MIRABILIS 07/01/2021     Lab Results   Component Value Date   ALBUMIN 2.4 (L) 08/27/2021   ALBUMIN 2.5 (L) 08/25/2021   ALBUMIN 2.6 (L) 06/05/2021    Lab Results  Component Value Date   MG 2.2 08/25/2021   MG 2.2 05/19/2021   MG 2.0 12/23/2020   No results found for: "VD25OH"  No results found for: "PREALBUMIN"    Latest Ref Rng & Units 08/28/2021    2:58 AM 08/27/2021    4:26 AM 08/26/2021    6:15 AM  CBC EXTENDED  WBC 4.0 - 10.5 K/uL 7.6  10.2  14.4   RBC 4.22 - 5.81 MIL/uL 3.97  3.98  4.12   Hemoglobin 13.0 - 17.0 g/dL 82.9  56.2  13.0   HCT 39.0 - 52.0 % 33.9  34.4  36.2   Platelets 150 - 400 K/uL 313  329  300      There is no height or weight on file to calculate BMI.  Orders:  No orders of the defined types were placed in this encounter.  No orders of the defined types were placed in this encounter.    Procedures: No procedures performed  Clinical Data: No additional findings.  ROS:  All other systems negative, except as noted in the HPI. Review of Systems  Objective: Vital Signs: There were no vitals taken for this visit.  Specialty Comments:  No specialty comments available.  PMFS History:  Patient Active Problem List   Diagnosis Date Noted   Skin fissures 12/31/2022   Aortic atherosclerosis (HCC) 09/04/2021   Hypercholesterolemia 09/04/2021   Unspecified atrial fibrillation (HCC) 08/25/2021   Acute on chronic diastolic CHF (congestive heart failure) (HCC) 08/25/2021   Wound of right leg, sequela 07/01/2021   Nail, injury by 06/10/2021   Laceration of leg 05/20/2021   Acute respiratory failure with hypoxia (HCC) 05/18/2021   Chronic combined systolic and diastolic heart failure (HCC) 05/18/2021   Chronic kidney disease (CKD), stage IV (severe) (HCC) 05/18/2021   Hypertension associated with diabetes (HCC) 05/18/2021   Insulin dependent type 2 diabetes mellitus (HCC) 05/18/2021   OSA on CPAP 05/18/2021   Laceration of right lower extremity 05/18/2021   Current chronic use of  systemic steroids 01/08/2021   Preoperative cardiovascular examination 01/08/2021   Cellulitis 12/23/2020   Benign neoplasm of duodenum, jejunum, 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   Chronic kidney disease, stage 4 (severe) (HCC) 12/03/2020   Degeneration of lumbar intervertebral disc 12/03/2020   Type 2 diabetes mellitus with stage 4 chronic kidney disease (HCC) 12/03/2020   Diabetic peripheral neuropathy associated with type 2 diabetes mellitus (HCC) 12/03/2020   Diastolic heart failure (HCC) 12/03/2020   Enlarged prostate 12/03/2020   Erectile dysfunction due to arterial insufficiency 12/03/2020   Essential hypertension 12/03/2020   Gastro-esophageal reflux disease without esophagitis 12/03/2020   Hematuria 12/03/2020   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   Morbid obesity (HCC) 12/03/2020   Osteoarthritis of hip 12/03/2020   Osteoarthritis of knee 12/03/2020   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   Restless legs 12/03/2020   Skin sensation disturbance 12/03/2020   Lumbar stenosis 11/21/2020   Tendinitis 09/03/2020   Subacute arthropathy 09/03/2020   Bilateral hearing loss 08/11/2020   Impacted cerumen of left ear 03/10/2017   Excessive cerumen in left ear canal 03/10/2017   Faintness 12/09/2015   Backache 11/14/2014   CHRONIC PANCREATITIS 06/16/2009   Past Medical History:  Diagnosis Date   Arthritis    all joints   Chronic diastolic CHF (congestive heart failure) (HCC)    Chronic kidney disease (CKD), stage IV (severe) (HCC)    Chronic kidney disease, stage 3 (HCC)    Congestive heart failure with LV diastolic dysfunction, NYHA class 2 (HCC)    Family history of adverse reaction to anesthesia    father- change in personaility   GERD (gastroesophageal reflux disease)    Hypertension    Hypertension associated with diabetes (HCC)    Hypertensive chronic kidney disease    Insulin dependent type 2 diabetes mellitus (HCC)     Neuropathy    OSA on CPAP    Sleep apnea    uses cpap, pt does not know settings    Family History  Problem Relation Age of Onset   Hypertension Mother    Anesthesia problems Neg Hx    Hypotension Neg Hx    Malignant hyperthermia Neg Hx    Pseudochol deficiency Neg Hx     Past Surgical History:  Procedure Laterality Date   ANTERIOR LAT LUMBAR FUSION Left 01/22/2021   Procedure: Lumbar one-Lumbar two Lateral Lumbar Interbody Fusion;  Surgeon: Bedelia Person, MD;  Location: [facility] OR;  Service: Neurosurgery;  Laterality: Left;   ANTERIOR LAT LUMBAR FUSION  01/22/2021   APPENDECTOMY     BACK SURGERY     4 surg, lower   CHOLECYSTECTOMY     COLONOSCOPY WITH PROPOFOL N/A 06/11/2014  Procedure: COLONOSCOPY WITH PROPOFOL;  Surgeon: Charolett Bumpers, MD;  Location: [facility];  Service: Endoscopy;  Laterality: N/A;   ESOPHAGOGASTRODUODENOSCOPY  08/26/2011   Procedure: ESOPHAGOGASTRODUODENOSCOPY (EGD);  Surgeon: Charolett Bumpers, MD;  Location: [facility];  Service: Endoscopy;  Laterality: N/A;   ESOPHAGOGASTRODUODENOSCOPY (EGD) WITH PROPOFOL N/A 06/11/2014   Procedure: ESOPHAGOGASTRODUODENOSCOPY (EGD) WITH PROPOFOL;  Surgeon: Charolett Bumpers, MD;  Location: [facility];  Service: Endoscopy;  Laterality: N/A;   EUS  09/15/2011   Procedure: UPPER ENDOSCOPIC ULTRASOUND (EUS) LINEAR;  Surgeon: Freddy Jaksch, MD;  Location: [facility];  Service: Endoscopy;  Laterality: N/A;  MAC   I & D EXTREMITY Right 05/20/2021   Procedure: IRRIGATION AND DEBRIDEMENT OF LEG AND APPLICATION OF SKIN GRAFT;  Surgeon: Nadara Mustard, MD;  Location: [facility] OR;  Service: Orthopedics;  Laterality: Right;   I & D EXTREMITY Right 07/03/2021   Procedure: REPEAT RIGHT LEG DEBRIDEMENT;  Surgeon: Nadara Mustard, MD;  Location: [facility] OR;  Service: Orthopedics;  Laterality: Right;   I & D EXTREMITY Right 07/01/2021   Procedure: RIGHT LEG DEBRIDEMENT;  Surgeon: Nadara Mustard, MD;  Location: [facility] OR;  Service: Orthopedics;   Laterality: Right;   KNEE ARTHROSCOPY Right YEARS AGO   Social History   Occupational History   Occupation: retired  Tobacco Use   Smoking status: Former    Current packs/day: 0.00    Average packs/day: 3.0 packs/day for 20.0 years (60.0 ttl pk-yrs)    Types: Cigarettes    Start date: 1964    Quit date: 1984    Years since quitting: 40.7   Smokeless tobacco: Never  Vaping Use   Vaping status: Never Used  Substance and Sexual Activity   Alcohol use: Not Currently   Drug use: Never   Sexual activity: Not on file

## 2023-04-27 DIAGNOSIS — I5020 Unspecified systolic (congestive) heart failure: Secondary | ICD-10-CM | POA: Diagnosis not present

## 2023-04-27 DIAGNOSIS — N184 Chronic kidney disease, stage 4 (severe): Secondary | ICD-10-CM | POA: Diagnosis not present

## 2023-04-27 DIAGNOSIS — R809 Proteinuria, unspecified: Secondary | ICD-10-CM | POA: Diagnosis not present

## 2023-04-27 DIAGNOSIS — N2581 Secondary hyperparathyroidism of renal origin: Secondary | ICD-10-CM | POA: Diagnosis not present

## 2023-04-28 DIAGNOSIS — Z23 Encounter for immunization: Secondary | ICD-10-CM | POA: Diagnosis not present

## 2023-04-28 DIAGNOSIS — E1140 Type 2 diabetes mellitus with diabetic neuropathy, unspecified: Secondary | ICD-10-CM | POA: Diagnosis not present

## 2023-04-28 DIAGNOSIS — N184 Chronic kidney disease, stage 4 (severe): Secondary | ICD-10-CM | POA: Diagnosis not present

## 2023-04-28 LAB — LAB REPORT - SCANNED: EGFR: 28

## 2023-05-03 DIAGNOSIS — N184 Chronic kidney disease, stage 4 (severe): Secondary | ICD-10-CM | POA: Diagnosis not present

## 2023-06-13 DIAGNOSIS — Z23 Encounter for immunization: Secondary | ICD-10-CM | POA: Diagnosis not present

## 2023-06-16 DIAGNOSIS — N2581 Secondary hyperparathyroidism of renal origin: Secondary | ICD-10-CM | POA: Diagnosis not present

## 2023-06-16 DIAGNOSIS — I5020 Unspecified systolic (congestive) heart failure: Secondary | ICD-10-CM | POA: Diagnosis not present

## 2023-06-16 DIAGNOSIS — R809 Proteinuria, unspecified: Secondary | ICD-10-CM | POA: Diagnosis not present

## 2023-06-16 DIAGNOSIS — L299 Pruritus, unspecified: Secondary | ICD-10-CM | POA: Diagnosis not present

## 2023-06-16 DIAGNOSIS — L97919 Non-pressure chronic ulcer of unspecified part of right lower leg with unspecified severity: Secondary | ICD-10-CM | POA: Diagnosis not present

## 2023-06-16 DIAGNOSIS — N184 Chronic kidney disease, stage 4 (severe): Secondary | ICD-10-CM | POA: Diagnosis not present

## 2023-06-16 LAB — LAB REPORT - SCANNED: EGFR: 21

## 2023-07-06 ENCOUNTER — Encounter: Payer: Self-pay | Admitting: Podiatry

## 2023-07-06 ENCOUNTER — Ambulatory Visit (INDEPENDENT_AMBULATORY_CARE_PROVIDER_SITE_OTHER): Payer: Medicare Other | Admitting: Podiatry

## 2023-07-06 DIAGNOSIS — M79675 Pain in left toe(s): Secondary | ICD-10-CM | POA: Diagnosis not present

## 2023-07-06 DIAGNOSIS — L84 Corns and callosities: Secondary | ICD-10-CM

## 2023-07-06 DIAGNOSIS — B351 Tinea unguium: Secondary | ICD-10-CM | POA: Diagnosis not present

## 2023-07-06 DIAGNOSIS — M79674 Pain in right toe(s): Secondary | ICD-10-CM | POA: Diagnosis not present

## 2023-07-06 DIAGNOSIS — E1142 Type 2 diabetes mellitus with diabetic polyneuropathy: Secondary | ICD-10-CM

## 2023-07-06 DIAGNOSIS — M170 Bilateral primary osteoarthritis of knee: Secondary | ICD-10-CM | POA: Diagnosis not present

## 2023-07-06 NOTE — Progress Notes (Signed)
This patient returns to the office for evaluation and treatment of long thick painful nails .  This patient is unable to trim his own nails since the patient cannot reach his feet.  Patient says the nails are painful walking and wearing his shoes. Patient has history of back surgery. Patient was in [facility] in October 2022.   He was hospitalized with cellulitis.  He returns for preventive foot care services.   General Appearance  Alert, conversant and in no acute stress.  Vascular  Dorsalis pedis and posterior tibial  pulses are palpable  bilaterally.  Capillary return is within normal limits  bilaterally. Temperature is within normal limits  bilaterally.  Neurologic  Senn-Weinstein monofilament wire test diminished  bilaterally. Muscle power within normal limits bilaterally.  Nails Thick disfigured discolored nails with subungual debris  from hallux to second  toes bilaterally. No evidence of bacterial infection or drainage bilaterally.  Orthopedic  No limitations of motion  feet .  No crepitus or effusions noted.  No bony pathology or digital deformities noted. Prominent metatarsal heads  B/L.  Skin  Dry scaly  skin with no porokeratosis noted bilaterally.  No signs of infections or ulcers noted.   Porokeratosis sub 4th met left foot.   Onychomycosis  Pain in toes right foot  Pain in toes left foot   Porokeratosis sub 4th met left foot.  Debridement  of nails  1-5  B/L with a nail nipper.  Nails were then filed using a dremel tool with no incidents upon debridement of nails. Debridement of porokeratosis   with # 15 blade and dremel tool.left forefoot. No evidence of infection feet  B/L   RTC 3 months    Helane Gunther DPM

## 2023-07-11 DIAGNOSIS — E119 Type 2 diabetes mellitus without complications: Secondary | ICD-10-CM | POA: Diagnosis not present

## 2023-07-11 DIAGNOSIS — H26493 Other secondary cataract, bilateral: Secondary | ICD-10-CM | POA: Diagnosis not present

## 2023-07-25 DIAGNOSIS — N184 Chronic kidney disease, stage 4 (severe): Secondary | ICD-10-CM | POA: Diagnosis not present

## 2023-08-01 ENCOUNTER — Telehealth: Payer: Self-pay | Admitting: Podiatry

## 2023-08-01 ENCOUNTER — Encounter: Payer: Self-pay | Admitting: Cardiovascular Disease

## 2023-08-03 DEATH — deceased

## 2023-08-21 ENCOUNTER — Other Ambulatory Visit: Payer: Self-pay | Admitting: Cardiovascular Disease

## 2023-09-23 ENCOUNTER — Ambulatory Visit: Payer: Medicare Other | Admitting: Cardiovascular Disease

## 2023-10-05 ENCOUNTER — Ambulatory Visit: Payer: Medicare Other | Admitting: Podiatry
